# Patient Record
Sex: Female | Born: 1955
Health system: Southern US, Community
[De-identification: ages and names within clinical notes are randomized; demographics above are authoritative.]

## PROBLEM LIST (undated history)

## (undated) DIAGNOSIS — G2581 Restless legs syndrome: Secondary | ICD-10-CM

## (undated) DIAGNOSIS — N3946 Mixed incontinence: Secondary | ICD-10-CM

## (undated) DIAGNOSIS — E042 Nontoxic multinodular goiter: Secondary | ICD-10-CM

## (undated) DIAGNOSIS — Z8673 Personal history of transient ischemic attack (TIA), and cerebral infarction without residual deficits: Secondary | ICD-10-CM

## (undated) DIAGNOSIS — M7989 Other specified soft tissue disorders: Secondary | ICD-10-CM

## (undated) DIAGNOSIS — G4733 Obstructive sleep apnea (adult) (pediatric): Secondary | ICD-10-CM

## (undated) DIAGNOSIS — R112 Nausea with vomiting, unspecified: Secondary | ICD-10-CM

## (undated) DIAGNOSIS — G9332 Myalgic encephalomyelitis/chronic fatigue syndrome: Secondary | ICD-10-CM

## (undated) DIAGNOSIS — R5382 Chronic fatigue, unspecified: Secondary | ICD-10-CM

## (undated) DIAGNOSIS — M8580 Other specified disorders of bone density and structure, unspecified site: Secondary | ICD-10-CM

## (undated) DIAGNOSIS — N3010 Interstitial cystitis (chronic) without hematuria: Secondary | ICD-10-CM

## (undated) DIAGNOSIS — K589 Irritable bowel syndrome without diarrhea: Secondary | ICD-10-CM

## (undated) DIAGNOSIS — F419 Anxiety disorder, unspecified: Secondary | ICD-10-CM

## (undated) DIAGNOSIS — F329 Major depressive disorder, single episode, unspecified: Secondary | ICD-10-CM

## (undated) DIAGNOSIS — G894 Chronic pain syndrome: Secondary | ICD-10-CM

## (undated) DIAGNOSIS — Z973 Presence of spectacles and contact lenses: Secondary | ICD-10-CM

## (undated) DIAGNOSIS — K219 Gastro-esophageal reflux disease without esophagitis: Secondary | ICD-10-CM

## (undated) DIAGNOSIS — G43909 Migraine, unspecified, not intractable, without status migrainosus: Secondary | ICD-10-CM

## (undated) DIAGNOSIS — M1990 Unspecified osteoarthritis, unspecified site: Secondary | ICD-10-CM

## (undated) DIAGNOSIS — E785 Hyperlipidemia, unspecified: Secondary | ICD-10-CM

## (undated) DIAGNOSIS — M797 Fibromyalgia: Secondary | ICD-10-CM

## (undated) DIAGNOSIS — Z9889 Other specified postprocedural states: Secondary | ICD-10-CM

## (undated) DIAGNOSIS — I10 Essential (primary) hypertension: Secondary | ICD-10-CM

## (undated) DIAGNOSIS — E119 Type 2 diabetes mellitus without complications: Secondary | ICD-10-CM

## (undated) HISTORY — DX: Anxiety disorder, unspecified: F41.9

## (undated) HISTORY — DX: Obstructive sleep apnea (adult) (pediatric): G47.33

## (undated) HISTORY — DX: Fibromyalgia: M79.7

## (undated) HISTORY — PX: VENTRAL HERNIA REPAIR: SHX424

## (undated) HISTORY — DX: Restless legs syndrome: G25.81

## (undated) HISTORY — DX: Other specified disorders of bone density and structure, unspecified site: M85.80

## (undated) HISTORY — PX: ANTERIOR CERVICAL DECOMP/DISCECTOMY FUSION: SHX1161

## (undated) HISTORY — DX: Interstitial cystitis (chronic) without hematuria: N30.10

## (undated) HISTORY — DX: Irritable bowel syndrome, unspecified: K58.9

## (undated) HISTORY — PX: LIPOMA EXCISION: SHX5283

## (undated) HISTORY — DX: Unspecified osteoarthritis, unspecified site: M19.90

## (undated) HISTORY — PX: ESOPHAGOGASTRODUODENOSCOPY: SHX1529

## (undated) HISTORY — PX: COLONOSCOPY: SHX174

## (undated) HISTORY — PX: OTHER SURGICAL HISTORY: SHX169

## (undated) HISTORY — DX: Myalgic encephalomyelitis/chronic fatigue syndrome: G93.32

## (undated) HISTORY — DX: Hyperlipidemia, unspecified: E78.5

## (undated) HISTORY — PX: TOTAL KNEE ARTHROPLASTY: SHX125

## (undated) HISTORY — PX: DIAGNOSTIC LAPAROSCOPY: SUR761

## (undated) HISTORY — DX: Chronic fatigue, unspecified: R53.82

## (undated) HISTORY — DX: Essential (primary) hypertension: I10

---
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
## 1976-11-19 HISTORY — PX: BREAST CYST EXCISION: SHX579

## 1980-11-19 HISTORY — PX: OTHER SURGICAL HISTORY: SHX169

## 1981-11-19 HISTORY — PX: ABDOMINAL EXPLORATION SURGERY: SHX538

## 1990-11-19 HISTORY — PX: LAPAROSCOPIC ASSISTED VAGINAL HYSTERECTOMY: SHX5398

## 1992-09-26 ENCOUNTER — Encounter: Payer: Self-pay | Admitting: Gastroenterology

## 1998-03-24 ENCOUNTER — Inpatient Hospital Stay (HOSPITAL_COMMUNITY): Admission: AD | Admit: 1998-03-24 | Discharge: 1998-04-01 | Payer: Self-pay | Admitting: Specialist

## 1998-03-24 ENCOUNTER — Ambulatory Visit (HOSPITAL_COMMUNITY): Admission: RE | Admit: 1998-03-24 | Discharge: 1998-03-24 | Payer: Self-pay | Admitting: Cardiology

## 1998-08-09 ENCOUNTER — Ambulatory Visit (HOSPITAL_COMMUNITY): Admission: RE | Admit: 1998-08-09 | Discharge: 1998-08-09 | Payer: Self-pay | Admitting: Urology

## 1998-10-18 ENCOUNTER — Encounter: Payer: Self-pay | Admitting: Obstetrics and Gynecology

## 1998-10-18 ENCOUNTER — Ambulatory Visit (HOSPITAL_COMMUNITY): Admission: RE | Admit: 1998-10-18 | Discharge: 1998-10-18 | Payer: Self-pay | Admitting: Obstetrics and Gynecology

## 1998-11-15 ENCOUNTER — Other Ambulatory Visit: Admission: RE | Admit: 1998-11-15 | Discharge: 1998-11-15 | Payer: Self-pay | Admitting: Podiatry

## 1999-02-10 ENCOUNTER — Ambulatory Visit (HOSPITAL_COMMUNITY): Admission: RE | Admit: 1999-02-10 | Discharge: 1999-02-10 | Payer: Self-pay | Admitting: Urology

## 1999-09-05 ENCOUNTER — Ambulatory Visit (HOSPITAL_COMMUNITY): Admission: RE | Admit: 1999-09-05 | Discharge: 1999-09-05 | Payer: Self-pay | Admitting: Urology

## 1999-11-09 ENCOUNTER — Encounter: Payer: Self-pay | Admitting: Obstetrics and Gynecology

## 1999-11-09 ENCOUNTER — Ambulatory Visit (HOSPITAL_COMMUNITY): Admission: RE | Admit: 1999-11-09 | Discharge: 1999-11-09 | Payer: Self-pay | Admitting: Obstetrics and Gynecology

## 1999-11-22 ENCOUNTER — Other Ambulatory Visit: Admission: RE | Admit: 1999-11-22 | Discharge: 1999-11-22 | Payer: Self-pay | Admitting: Obstetrics and Gynecology

## 1999-12-15 ENCOUNTER — Encounter: Payer: Self-pay | Admitting: General Surgery

## 1999-12-21 ENCOUNTER — Inpatient Hospital Stay (HOSPITAL_COMMUNITY): Admission: RE | Admit: 1999-12-21 | Discharge: 1999-12-21 | Payer: Self-pay | Admitting: General Surgery

## 2000-03-20 ENCOUNTER — Ambulatory Visit (HOSPITAL_COMMUNITY): Admission: RE | Admit: 2000-03-20 | Discharge: 2000-03-20 | Payer: Self-pay | Admitting: Gastroenterology

## 2000-09-06 ENCOUNTER — Ambulatory Visit (HOSPITAL_COMMUNITY): Admission: RE | Admit: 2000-09-06 | Discharge: 2000-09-06 | Payer: Self-pay | Admitting: Urology

## 2001-05-16 ENCOUNTER — Encounter (INDEPENDENT_AMBULATORY_CARE_PROVIDER_SITE_OTHER): Payer: Self-pay | Admitting: Specialist

## 2001-05-16 ENCOUNTER — Ambulatory Visit (HOSPITAL_COMMUNITY): Admission: RE | Admit: 2001-05-16 | Discharge: 2001-05-16 | Payer: Self-pay | Admitting: Urology

## 2001-07-31 ENCOUNTER — Other Ambulatory Visit: Admission: RE | Admit: 2001-07-31 | Discharge: 2001-07-31 | Payer: Self-pay | Admitting: Obstetrics and Gynecology

## 2001-08-01 ENCOUNTER — Ambulatory Visit (HOSPITAL_COMMUNITY): Admission: RE | Admit: 2001-08-01 | Discharge: 2001-08-01 | Payer: Self-pay | Admitting: Obstetrics and Gynecology

## 2001-08-01 ENCOUNTER — Encounter: Payer: Self-pay | Admitting: Obstetrics and Gynecology

## 2001-10-08 ENCOUNTER — Encounter: Payer: Self-pay | Admitting: *Deleted

## 2001-10-08 ENCOUNTER — Encounter: Admission: RE | Admit: 2001-10-08 | Discharge: 2001-10-08 | Payer: Self-pay | Admitting: *Deleted

## 2002-01-27 ENCOUNTER — Ambulatory Visit (HOSPITAL_BASED_OUTPATIENT_CLINIC_OR_DEPARTMENT_OTHER): Admission: RE | Admit: 2002-01-27 | Discharge: 2002-01-27 | Payer: Self-pay | Admitting: Urology

## 2002-09-11 ENCOUNTER — Ambulatory Visit (HOSPITAL_COMMUNITY): Admission: RE | Admit: 2002-09-11 | Discharge: 2002-09-11 | Payer: Self-pay | Admitting: Urology

## 2002-11-19 HISTORY — PX: NASAL SINUS SURGERY: SHX719

## 2003-04-26 ENCOUNTER — Other Ambulatory Visit: Admission: RE | Admit: 2003-04-26 | Discharge: 2003-04-26 | Payer: Self-pay | Admitting: Family Medicine

## 2003-05-11 ENCOUNTER — Ambulatory Visit (HOSPITAL_BASED_OUTPATIENT_CLINIC_OR_DEPARTMENT_OTHER): Admission: RE | Admit: 2003-05-11 | Discharge: 2003-05-11 | Payer: Self-pay | Admitting: Urology

## 2004-01-18 ENCOUNTER — Observation Stay (HOSPITAL_COMMUNITY): Admission: RE | Admit: 2004-01-18 | Discharge: 2004-01-19 | Payer: Self-pay | Admitting: Obstetrics and Gynecology

## 2004-04-03 ENCOUNTER — Encounter (INDEPENDENT_AMBULATORY_CARE_PROVIDER_SITE_OTHER): Payer: Self-pay | Admitting: *Deleted

## 2004-04-03 ENCOUNTER — Ambulatory Visit (HOSPITAL_COMMUNITY): Admission: RE | Admit: 2004-04-03 | Discharge: 2004-04-03 | Payer: Self-pay | Admitting: Surgery

## 2004-04-03 ENCOUNTER — Ambulatory Visit (HOSPITAL_BASED_OUTPATIENT_CLINIC_OR_DEPARTMENT_OTHER): Admission: RE | Admit: 2004-04-03 | Discharge: 2004-04-03 | Payer: Self-pay | Admitting: Surgery

## 2004-05-08 ENCOUNTER — Ambulatory Visit (HOSPITAL_BASED_OUTPATIENT_CLINIC_OR_DEPARTMENT_OTHER): Admission: RE | Admit: 2004-05-08 | Discharge: 2004-05-08 | Payer: Self-pay | Admitting: Family Medicine

## 2004-10-03 ENCOUNTER — Ambulatory Visit (HOSPITAL_BASED_OUTPATIENT_CLINIC_OR_DEPARTMENT_OTHER): Admission: RE | Admit: 2004-10-03 | Discharge: 2004-10-03 | Payer: Self-pay | Admitting: Urology

## 2004-10-03 ENCOUNTER — Observation Stay (HOSPITAL_COMMUNITY): Admission: RE | Admit: 2004-10-03 | Discharge: 2004-10-04 | Payer: Self-pay | Admitting: Urology

## 2004-12-05 ENCOUNTER — Ambulatory Visit (HOSPITAL_COMMUNITY): Admission: RE | Admit: 2004-12-05 | Discharge: 2004-12-05 | Payer: Self-pay | Admitting: Family Medicine

## 2005-04-02 ENCOUNTER — Encounter: Admission: RE | Admit: 2005-04-02 | Discharge: 2005-07-01 | Payer: Self-pay | Admitting: Physician Assistant

## 2005-06-12 ENCOUNTER — Ambulatory Visit (HOSPITAL_COMMUNITY): Admission: RE | Admit: 2005-06-12 | Discharge: 2005-06-12 | Payer: Self-pay | Admitting: Urology

## 2005-06-12 ENCOUNTER — Ambulatory Visit (HOSPITAL_BASED_OUTPATIENT_CLINIC_OR_DEPARTMENT_OTHER): Admission: RE | Admit: 2005-06-12 | Discharge: 2005-06-12 | Payer: Self-pay | Admitting: Urology

## 2005-08-09 ENCOUNTER — Encounter: Admission: RE | Admit: 2005-08-09 | Discharge: 2005-08-09 | Payer: Self-pay | Admitting: Internal Medicine

## 2005-08-17 ENCOUNTER — Other Ambulatory Visit: Admission: RE | Admit: 2005-08-17 | Discharge: 2005-08-17 | Payer: Self-pay | Admitting: Obstetrics and Gynecology

## 2005-09-05 ENCOUNTER — Emergency Department (HOSPITAL_COMMUNITY): Admission: EM | Admit: 2005-09-05 | Discharge: 2005-09-06 | Payer: Self-pay | Admitting: Emergency Medicine

## 2005-12-05 ENCOUNTER — Encounter: Admission: RE | Admit: 2005-12-05 | Discharge: 2005-12-05 | Payer: Self-pay | Admitting: Urology

## 2005-12-07 ENCOUNTER — Ambulatory Visit (HOSPITAL_BASED_OUTPATIENT_CLINIC_OR_DEPARTMENT_OTHER): Admission: RE | Admit: 2005-12-07 | Discharge: 2005-12-07 | Payer: Self-pay | Admitting: Urology

## 2006-06-05 ENCOUNTER — Inpatient Hospital Stay (HOSPITAL_COMMUNITY): Admission: RE | Admit: 2006-06-05 | Discharge: 2006-06-11 | Payer: Self-pay | Admitting: General Surgery

## 2006-08-12 ENCOUNTER — Encounter: Admission: RE | Admit: 2006-08-12 | Discharge: 2006-08-12 | Payer: Self-pay | Admitting: General Surgery

## 2006-10-22 ENCOUNTER — Ambulatory Visit (HOSPITAL_BASED_OUTPATIENT_CLINIC_OR_DEPARTMENT_OTHER): Admission: RE | Admit: 2006-10-22 | Discharge: 2006-10-22 | Payer: Self-pay | Admitting: Urology

## 2007-07-01 ENCOUNTER — Ambulatory Visit (HOSPITAL_BASED_OUTPATIENT_CLINIC_OR_DEPARTMENT_OTHER): Admission: RE | Admit: 2007-07-01 | Discharge: 2007-07-01 | Payer: Self-pay | Admitting: Urology

## 2007-11-18 ENCOUNTER — Encounter: Admission: RE | Admit: 2007-11-18 | Discharge: 2007-11-18 | Payer: Self-pay | Admitting: Obstetrics and Gynecology

## 2008-01-02 ENCOUNTER — Ambulatory Visit: Payer: Self-pay | Admitting: Gastroenterology

## 2008-02-03 ENCOUNTER — Ambulatory Visit: Payer: Self-pay | Admitting: Gastroenterology

## 2008-02-03 LAB — CONVERTED CEMR LAB
ALT: 20 units/L (ref 0–35)
AST: 17 units/L (ref 0–37)
Alkaline Phosphatase: 75 units/L (ref 39–117)
BUN: 20 mg/dL (ref 6–23)
Basophils Absolute: 0 10*3/uL (ref 0.0–0.1)
Bilirubin, Direct: 0.1 mg/dL (ref 0.0–0.3)
CO2: 32 meq/L (ref 19–32)
Calcium: 9 mg/dL (ref 8.4–10.5)
Creatinine, Ser: 0.9 mg/dL (ref 0.4–1.2)
Eosinophils Relative: 3.3 % (ref 0.0–5.0)
GFR calc Af Amer: 85 mL/min
Glucose, Bld: 185 mg/dL — ABNORMAL HIGH (ref 70–99)
HCT: 37.7 % (ref 36.0–46.0)
Hemoglobin: 12.3 g/dL (ref 12.0–15.0)
MCHC: 32.6 g/dL (ref 30.0–36.0)
MCV: 86.3 fL (ref 78.0–100.0)
Monocytes Absolute: 0.4 10*3/uL (ref 0.2–0.7)
Neutrophils Relative %: 55.8 % (ref 43.0–77.0)
RDW: 12.5 % (ref 11.5–14.6)
Saturation Ratios: 31.5 % (ref 20.0–50.0)
Tissue Transglutaminase Ab, IgA: 0.3 units (ref <7)
Total Bilirubin: 0.6 mg/dL (ref 0.3–1.2)
Total Protein: 6 g/dL (ref 6.0–8.3)
WBC: 4.9 10*3/uL (ref 4.5–10.5)

## 2008-02-06 ENCOUNTER — Encounter (INDEPENDENT_AMBULATORY_CARE_PROVIDER_SITE_OTHER): Payer: Self-pay | Admitting: Orthopedic Surgery

## 2008-02-06 ENCOUNTER — Ambulatory Visit: Payer: Self-pay | Admitting: *Deleted

## 2008-02-06 ENCOUNTER — Ambulatory Visit: Admission: RE | Admit: 2008-02-06 | Discharge: 2008-02-06 | Payer: Self-pay | Admitting: Orthopedic Surgery

## 2008-03-16 ENCOUNTER — Ambulatory Visit: Payer: Self-pay | Admitting: Gastroenterology

## 2008-03-16 DIAGNOSIS — IMO0001 Reserved for inherently not codable concepts without codable children: Secondary | ICD-10-CM

## 2008-03-16 DIAGNOSIS — F411 Generalized anxiety disorder: Secondary | ICD-10-CM | POA: Insufficient documentation

## 2008-03-16 DIAGNOSIS — I10 Essential (primary) hypertension: Secondary | ICD-10-CM

## 2008-03-16 DIAGNOSIS — K589 Irritable bowel syndrome without diarrhea: Secondary | ICD-10-CM

## 2008-03-16 DIAGNOSIS — K210 Gastro-esophageal reflux disease with esophagitis: Secondary | ICD-10-CM

## 2008-05-19 ENCOUNTER — Encounter: Admission: RE | Admit: 2008-05-19 | Discharge: 2008-05-19 | Payer: Self-pay | Admitting: Obstetrics and Gynecology

## 2008-06-02 ENCOUNTER — Encounter: Payer: Self-pay | Admitting: Internal Medicine

## 2008-06-02 LAB — CONVERTED CEMR LAB
HDL: 45 mg/dL
Hgb A1c MFr Bld: 6.9 %
LDL (calc): 35 mg/dL
LDL Cholesterol: 108 mg/dL
Triglyceride fasting, serum: 175 mg/dL

## 2008-07-20 ENCOUNTER — Ambulatory Visit (HOSPITAL_COMMUNITY): Admission: RE | Admit: 2008-07-20 | Discharge: 2008-07-21 | Payer: Self-pay | Admitting: Obstetrics and Gynecology

## 2008-07-20 HISTORY — PX: RECTOCELE REPAIR: SHX761

## 2008-08-24 ENCOUNTER — Encounter: Payer: Self-pay | Admitting: Gastroenterology

## 2008-11-18 ENCOUNTER — Encounter: Admission: RE | Admit: 2008-11-18 | Discharge: 2008-11-18 | Payer: Self-pay | Admitting: Obstetrics and Gynecology

## 2009-01-04 ENCOUNTER — Ambulatory Visit (HOSPITAL_COMMUNITY): Admission: RE | Admit: 2009-01-04 | Discharge: 2009-01-05 | Payer: Self-pay | Admitting: Neurosurgery

## 2009-01-25 ENCOUNTER — Encounter: Payer: Self-pay | Admitting: Internal Medicine

## 2009-01-25 LAB — CONVERTED CEMR LAB
ALT: 12 U/L
AST: 14 U/L
Albumin: 4 g/dL
Alkaline Phosphatase: 71 U/L
BUN: 17 mg/dL
CO2: 26 meq/L
Calcium: 9.2 mg/dL
Chloride: 97 meq/L
Creatinine, Ser: 1.06 mg/dL
Glucose, Bld: 149 mg/dL
Hgb A1c MFr Bld: 6.3 %
Potassium: 4.1 meq/L
Sodium: 138 meq/L
TSH: 1.85 u[IU]/mL
Total Bilirubin: 0.3 mg/dL
Total Protein: 6.5 g/dL

## 2009-02-09 ENCOUNTER — Encounter: Admission: RE | Admit: 2009-02-09 | Discharge: 2009-02-09 | Payer: Self-pay | Admitting: Internal Medicine

## 2009-02-24 ENCOUNTER — Encounter: Admission: RE | Admit: 2009-02-24 | Discharge: 2009-02-24 | Payer: Self-pay | Admitting: Neurosurgery

## 2009-05-26 ENCOUNTER — Encounter: Payer: Self-pay | Admitting: Internal Medicine

## 2009-05-26 LAB — CONVERTED CEMR LAB
ALT: 11 units/L
Alkaline Phosphatase: 76 units/L
CO2: 25 meq/L
Chloride: 103 meq/L
HCT: 36.6 %
Hgb A1c MFr Bld: 6.4 %
LDL (calc): 25 mg/dL
LDL Cholesterol: 118 mg/dL
MCV: 88 fL
Platelets: 192 10*3/uL
RBC: 4.18 M/uL
RDW: 14.2 %
TSH: 1.74 microintl units/mL
Total Bilirubin: 0.3 mg/dL
Triglyceride fasting, serum: 127 mg/dL

## 2009-06-07 ENCOUNTER — Other Ambulatory Visit: Admission: RE | Admit: 2009-06-07 | Discharge: 2009-06-07 | Payer: Self-pay | Admitting: Diagnostic Radiology

## 2009-06-07 ENCOUNTER — Encounter: Admission: RE | Admit: 2009-06-07 | Discharge: 2009-06-07 | Payer: Self-pay | Admitting: Otolaryngology

## 2009-06-07 ENCOUNTER — Encounter (INDEPENDENT_AMBULATORY_CARE_PROVIDER_SITE_OTHER): Payer: Self-pay | Admitting: Diagnostic Radiology

## 2009-10-11 ENCOUNTER — Ambulatory Visit: Payer: Self-pay | Admitting: Internal Medicine

## 2009-10-11 DIAGNOSIS — M810 Age-related osteoporosis without current pathological fracture: Secondary | ICD-10-CM | POA: Insufficient documentation

## 2009-10-11 DIAGNOSIS — J309 Allergic rhinitis, unspecified: Secondary | ICD-10-CM | POA: Insufficient documentation

## 2009-10-11 DIAGNOSIS — K219 Gastro-esophageal reflux disease without esophagitis: Secondary | ICD-10-CM

## 2009-10-11 DIAGNOSIS — Z87442 Personal history of urinary calculi: Secondary | ICD-10-CM | POA: Insufficient documentation

## 2009-10-11 DIAGNOSIS — N3010 Interstitial cystitis (chronic) without hematuria: Secondary | ICD-10-CM | POA: Insufficient documentation

## 2009-10-11 DIAGNOSIS — E1169 Type 2 diabetes mellitus with other specified complication: Secondary | ICD-10-CM

## 2009-10-11 DIAGNOSIS — E785 Hyperlipidemia, unspecified: Secondary | ICD-10-CM

## 2009-10-11 DIAGNOSIS — J329 Chronic sinusitis, unspecified: Secondary | ICD-10-CM | POA: Insufficient documentation

## 2009-10-11 LAB — CONVERTED CEMR LAB
BUN: 18 mg/dL (ref 6–23)
CO2: 28 meq/L (ref 19–32)
Chloride: 101 meq/L (ref 96–112)
Cholesterol: 206 mg/dL — ABNORMAL HIGH (ref 0–200)
Creatinine, Ser: 0.8 mg/dL (ref 0.4–1.2)
Creatinine,U: 163 mg/dL
Eosinophils Absolute: 0.1 10*3/uL (ref 0.0–0.7)
Eosinophils Relative: 0.9 % (ref 0.0–5.0)
Glucose, Bld: 121 mg/dL — ABNORMAL HIGH (ref 70–99)
HCT: 40.6 % (ref 36.0–46.0)
HDL: 53.1 mg/dL (ref >39.00)
Lymphs Abs: 1.5 10*3/uL (ref 0.7–4.0)
MCHC: 33.5 g/dL (ref 30.0–36.0)
MCV: 89.3 fL (ref 78.0–100.0)
Monocytes Absolute: 0.4 10*3/uL (ref 0.1–1.0)
Neutrophils Relative %: 70.4 % (ref 43.0–77.0)
Platelets: 196 10*3/uL (ref 150.0–400.0)
Potassium: 4.1 meq/L (ref 3.5–5.1)
TSH: 2.05 microintl units/mL (ref 0.35–5.50)
Total CHOL/HDL Ratio: 4
Triglycerides: 124 mg/dL (ref 0.0–149.0)
VLDL: 24.8 mg/dL (ref 0.0–40.0)
WBC: 6.9 10*3/uL (ref 4.5–10.5)

## 2009-10-17 DIAGNOSIS — E559 Vitamin D deficiency, unspecified: Secondary | ICD-10-CM | POA: Insufficient documentation

## 2009-10-26 ENCOUNTER — Encounter: Payer: Self-pay | Admitting: Internal Medicine

## 2009-11-16 ENCOUNTER — Encounter (INDEPENDENT_AMBULATORY_CARE_PROVIDER_SITE_OTHER): Payer: Self-pay | Admitting: *Deleted

## 2009-11-16 ENCOUNTER — Ambulatory Visit: Payer: Self-pay | Admitting: Internal Medicine

## 2009-11-16 ENCOUNTER — Telehealth: Payer: Self-pay | Admitting: Internal Medicine

## 2009-11-16 DIAGNOSIS — G5600 Carpal tunnel syndrome, unspecified upper limb: Secondary | ICD-10-CM | POA: Insufficient documentation

## 2009-11-17 ENCOUNTER — Encounter: Admission: RE | Admit: 2009-11-17 | Discharge: 2009-11-17 | Payer: Self-pay | Admitting: Internal Medicine

## 2009-12-06 ENCOUNTER — Encounter: Payer: Self-pay | Admitting: Internal Medicine

## 2009-12-12 ENCOUNTER — Telehealth: Payer: Self-pay | Admitting: Internal Medicine

## 2009-12-19 ENCOUNTER — Telehealth: Payer: Self-pay | Admitting: Internal Medicine

## 2009-12-30 ENCOUNTER — Encounter: Payer: Self-pay | Admitting: Internal Medicine

## 2010-01-10 ENCOUNTER — Ambulatory Visit: Payer: Self-pay | Admitting: Internal Medicine

## 2010-01-10 DIAGNOSIS — J209 Acute bronchitis, unspecified: Secondary | ICD-10-CM

## 2010-01-12 ENCOUNTER — Encounter: Payer: Self-pay | Admitting: Endocrinology

## 2010-01-12 ENCOUNTER — Telehealth (INDEPENDENT_AMBULATORY_CARE_PROVIDER_SITE_OTHER): Payer: Self-pay | Admitting: *Deleted

## 2010-01-16 ENCOUNTER — Telehealth: Payer: Self-pay | Admitting: Internal Medicine

## 2010-01-16 ENCOUNTER — Encounter (INDEPENDENT_AMBULATORY_CARE_PROVIDER_SITE_OTHER): Payer: Self-pay | Admitting: *Deleted

## 2010-01-30 ENCOUNTER — Encounter (INDEPENDENT_AMBULATORY_CARE_PROVIDER_SITE_OTHER): Payer: Self-pay | Admitting: *Deleted

## 2010-01-30 ENCOUNTER — Ambulatory Visit: Payer: Self-pay | Admitting: Internal Medicine

## 2010-01-30 DIAGNOSIS — M25559 Pain in unspecified hip: Secondary | ICD-10-CM

## 2010-02-21 ENCOUNTER — Encounter: Payer: Self-pay | Admitting: Internal Medicine

## 2010-02-28 ENCOUNTER — Encounter: Payer: Self-pay | Admitting: Internal Medicine

## 2010-03-06 ENCOUNTER — Encounter: Admission: RE | Admit: 2010-03-06 | Discharge: 2010-03-06 | Payer: Self-pay | Admitting: Otolaryngology

## 2010-03-14 ENCOUNTER — Encounter: Payer: Self-pay | Admitting: Internal Medicine

## 2010-05-10 ENCOUNTER — Ambulatory Visit: Payer: Self-pay | Admitting: Internal Medicine

## 2010-05-10 DIAGNOSIS — G2581 Restless legs syndrome: Secondary | ICD-10-CM

## 2010-05-11 LAB — CONVERTED CEMR LAB
CO2: 31 meq/L (ref 19–32)
Calcium: 9.7 mg/dL (ref 8.4–10.5)
Creatinine, Ser: 0.8 mg/dL (ref 0.4–1.2)

## 2010-05-12 ENCOUNTER — Telehealth: Payer: Self-pay | Admitting: Gastroenterology

## 2010-05-30 ENCOUNTER — Encounter: Payer: Self-pay | Admitting: Internal Medicine

## 2010-06-09 ENCOUNTER — Ambulatory Visit: Payer: Self-pay | Admitting: Internal Medicine

## 2010-06-09 ENCOUNTER — Telehealth: Payer: Self-pay | Admitting: Gastroenterology

## 2010-06-09 ENCOUNTER — Telehealth (INDEPENDENT_AMBULATORY_CARE_PROVIDER_SITE_OTHER): Payer: Self-pay | Admitting: *Deleted

## 2010-06-13 ENCOUNTER — Encounter: Payer: Self-pay | Admitting: Internal Medicine

## 2010-07-14 ENCOUNTER — Ambulatory Visit: Payer: Self-pay | Admitting: Internal Medicine

## 2010-07-14 ENCOUNTER — Encounter: Payer: Self-pay | Admitting: Internal Medicine

## 2010-07-17 ENCOUNTER — Encounter: Payer: Self-pay | Admitting: Internal Medicine

## 2010-07-31 ENCOUNTER — Inpatient Hospital Stay (HOSPITAL_COMMUNITY): Admission: RE | Admit: 2010-07-31 | Discharge: 2010-08-03 | Payer: Self-pay | Admitting: Orthopedic Surgery

## 2010-08-03 ENCOUNTER — Ambulatory Visit: Payer: Self-pay | Admitting: Family Medicine

## 2010-08-03 DIAGNOSIS — G894 Chronic pain syndrome: Secondary | ICD-10-CM

## 2010-08-03 DIAGNOSIS — T887XXA Unspecified adverse effect of drug or medicament, initial encounter: Secondary | ICD-10-CM | POA: Insufficient documentation

## 2010-08-04 ENCOUNTER — Ambulatory Visit: Payer: Self-pay | Admitting: Vascular Surgery

## 2010-08-04 ENCOUNTER — Encounter: Payer: Self-pay | Admitting: Family Medicine

## 2010-08-04 ENCOUNTER — Telehealth: Payer: Self-pay | Admitting: Family Medicine

## 2010-08-04 ENCOUNTER — Encounter (INDEPENDENT_AMBULATORY_CARE_PROVIDER_SITE_OTHER): Payer: Self-pay | Admitting: Emergency Medicine

## 2010-08-04 ENCOUNTER — Emergency Department (HOSPITAL_COMMUNITY): Admission: EM | Admit: 2010-08-04 | Discharge: 2010-08-04 | Payer: Self-pay | Admitting: Emergency Medicine

## 2010-08-10 ENCOUNTER — Encounter: Payer: Self-pay | Admitting: Family Medicine

## 2010-08-21 ENCOUNTER — Telehealth (INDEPENDENT_AMBULATORY_CARE_PROVIDER_SITE_OTHER): Payer: Self-pay | Admitting: *Deleted

## 2010-08-29 ENCOUNTER — Inpatient Hospital Stay (HOSPITAL_COMMUNITY): Admission: RE | Admit: 2010-08-29 | Discharge: 2010-09-01 | Payer: Self-pay | Admitting: Orthopedic Surgery

## 2010-10-04 ENCOUNTER — Encounter: Payer: Self-pay | Admitting: Internal Medicine

## 2010-10-05 ENCOUNTER — Telehealth: Payer: Self-pay | Admitting: Internal Medicine

## 2010-10-16 ENCOUNTER — Encounter: Payer: Self-pay | Admitting: Internal Medicine

## 2010-10-17 ENCOUNTER — Ambulatory Visit: Payer: Self-pay | Admitting: Internal Medicine

## 2010-10-17 DIAGNOSIS — M129 Arthropathy, unspecified: Secondary | ICD-10-CM

## 2010-12-08 ENCOUNTER — Ambulatory Visit: Admit: 2010-12-08 | Payer: Self-pay | Admitting: Internal Medicine

## 2010-12-10 ENCOUNTER — Encounter: Payer: Self-pay | Admitting: Internal Medicine

## 2010-12-10 ENCOUNTER — Encounter: Payer: Self-pay | Admitting: Obstetrics and Gynecology

## 2010-12-13 ENCOUNTER — Telehealth: Payer: Self-pay | Admitting: Internal Medicine

## 2010-12-21 NOTE — Letter (Signed)
Summary: Out of Work  [facility]  [address] [place], [place] [address]   Phone: [phone]  Fax: [phone]    January 12, 2010   Employee:  [name] [facility]    To Whom It May Concern:   For Medical reasons, please excuse the above named employee from work for the following dates:  Start:   01/05/10  End:   01/16/10    Sincerely,    Minus Breeding MD

## 2010-12-21 NOTE — Progress Notes (Signed)
Summary: FMLA?  Phone Note Call from Patient Call back at Home Phone [phone]   Caller: Patient Summary of Call: pt called stating that she needs MD to change dates on her FMLA form to reflect updated out of work note done by SAE. I will advise pt to bring in paperwork and I will update. Will MD initial? Initial call taken by: Margaret Pyle, CMA,  January 16, 2010 9:03 AM  Follow-up for Phone Call        fine Follow-up by: Newt Lukes MD,  January 16, 2010 9:30 AM  Additional Follow-up for Phone Call Additional follow up Details #1::        pt is requesting to return to [facility], [place] tomorrow- March 1st, and would like this on FMLA and a work note. okay to generate? Additional Follow-up by: Margaret Pyle, CMA,  January 16, 2010 9:41 AM    Additional Follow-up for Phone Call Additional follow up Details #2::    fine Newt Lukes MD  January 16, 2010 9:46 AM   Additional Follow-up for Phone Call Additional follow up Details #3:: Details for Additional Follow-up Action Taken: done, pt notified Additional Follow-up by: Margaret Pyle, CMA,  January 16, 2010 10:08 AM

## 2010-12-21 NOTE — Progress Notes (Signed)
Summary: switch  Phone Note Call from Patient Call back at Home Phone [phone]   Caller: Patient Call For: Jarold Motto Reason for Call: Talk to Nurse Summary of Call: Patient would like to switch from Dr Jarold Motto to Dr Juanda Chance because she met Dr Juanda Chance in the hosp. while her friend was getting a colonoscopy and because Dr Juanda Chance is her mothers doctor. Initial call taken by: Tawni Levy,  May 12, 2010 9:32 AM  Follow-up for Phone Call        DEFINITELY OK... Follow-up by: Mardella Layman MD Clementeen Graham  May 12, 2010 10:02 AM    Additional Follow-up for Phone Call Additional follow up Details #2::    I am sorry, my practice is full,  Sorry. Follow-up by: Hart Carwin MD,  May 12, 2010 2:03 PM

## 2010-12-21 NOTE — Progress Notes (Signed)
  Phone Note Other Incoming   Summary of Call: Pt left a message on triage stating Dr Felicity Coyer put her off work for a viral infection and to return on 01/12/10. Pt states she still has a temp of 99.6. Pt would like to know if she should go back today or wait? Please advise? Initial call taken by: Josph Macho RMA,  January 12, 2010 12:44 PM  Follow-up for Phone Call        please reteurn to work 01/16/10.  i can write a letter extending if you wish. Follow-up by: Minus Breeding MD,  January 12, 2010 1:31 PM  Additional Follow-up for Phone Call Additional follow up Details #1::        Informed pt and she would like a note stating that please. Additional Follow-up by: Josph Macho RMA,  January 12, 2010 1:43 PM    Additional Follow-up for Phone Call Additional follow up Details #2::    done Follow-up by: Minus Breeding MD,  January 12, 2010 2:56 PM  Additional Follow-up for Phone Call Additional follow up Details #3:: Details for Additional Follow-up Action Taken: Informed pt that note would be up front. Additional Follow-up by: Josph Macho RMA,  January 12, 2010 3:37 PM

## 2010-12-21 NOTE — Assessment & Plan Note (Signed)
Primary Care Remmie Bembenek:  Newt Lukes MD   History of Present Illness: Patient unhappy with physcial therapy program at [facility] and has arranged transfer to another facility.  She expects therapy several hours 7 days per week, and plans for second knee replacement in a few months.    Med changes only inculded addition of coumadin to prevent DVT, she has had one week of therapy and will need a 3 additional weeks.  FL2 was completed and patient was seen.  Current Medications (verified): 1)  Amlodipine Besylate 5 Mg Tabs (Amlodipine Besylate) .... Take 1 By Mouth Qd 2)  Losartan Potassium 50 Mg Tabs (Losartan Potassium) .Marland Kitchen.. 1 By Mouth Once Daily 3)  Lovaza 1 Gm Caps (Omega-3-Acid Ethyl Esters) .... Take One By Mouth Two Times A Day 4)  Pristiq 50 Mg Xr24h-Tab (Desvenlafaxine Succinate) .Marland Kitchen.. 1 By Mouth Once Daily 5)  Celexa 40 Mg Tabs (Citalopram Hydrobromide) .... Take One By Mouth At Bedtime 6)  Nexium 40 Mg Cpdr (Esomeprazole Magnesium) .... Take One By Mouth At Bedtime 7)  Baclofen 10 Mg Tabs (Baclofen) .Marland Kitchen.. 1 Tab By Mouth Three Times A Day As Needed Pain 8)  Trazodone Hcl 100 Mg Tabs (Trazodone Hcl) .... Take One By Mouth At Bedtime 9)  Requip 4 Mg Tabs (Ropinirole Hcl) .Marland Kitchen.. 1 By Mouth At Bedtime 10)  Lasix 20 Mg Tabs (Furosemide) .... Take One By Mouth Once Daily 11)  Potassium Gluconate 595 Mg Tabs (Potassium Gluconate) .... Tale 1 By Mouth Qd 12)  Fentanyl 100 Mcg/hr Pt72 (Fentanyl) .... Change Q 3 Days 13)  Alprazolam 1 Mg Tabs (Alprazolam) .... A Tab By Mouth Three Times A Day Prn 14)  Allegra 180 Mg Tabs (Fexofenadine Hcl) .... Take 1 By Mouth Once Daily As Needed 15)  Promethazine Hcl 25 Mg Tabs (Promethazine Hcl) .... Take One By Mouth At Bedtime 16)  Triamcinolone Acetonide 0.1 % Crea (Triamcinolone Acetonide) .... Appy As Directed 17)  Vagifem 10 Mcg Tabs (Estradiol) .... Take 1 At Bedtime Mon, Wed and Fri 18)  Oxycodone Hcl 15 Mg Tabs (Oxycodone Hcl) .... One Tab Q4  As Needed Pain 19)  Acetaminophen 500 Mg Caps (Acetaminophen) .... 2 Tabs Q 8 Hours As Needed Pain 20)  Colace 100 Mg Caps (Docusate Sodium) .... Once Daily 21)  Ferrous Sulfate 325 (65 Fe) Mg Tabs (Ferrous Sulfate) .... One Tablet Daily 22)  Coumadin 5 Mg Tabs (Warfarin Sodium) .... As Directed  Allergies (verified): 1)  ! Macrobid 2)  ! Darvocet 3)  ! Flagyl 4)  ! Lopid 5)  ! * Altace 6)  ! * Benicar Hct 7)  ! Parafon Forte 8)  ! Neurontin 9)  ! Sulfa 10)  ! * Elmiron 11)  ! * Ramipril  Physical Exam  General:  Well-developed,well-nourished,in no acute distress; alert,lying in bed.   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  INR was 2.3 today on 7.5 mg daily, she was order to stay on that dose with recheck on 08/14/10 at that time she will be under the care of another Tarrin Menn.  Orders: Sarayu Prevost Misc Charge- [facility] (Misc)  Problem # 2:  CHRONIC PAIN SYNDROME (ICD-338.4) Tolerating high doses of narcotics and serotonin based meds. Orders: Germain Koopmann Misc Charge- [facility] (Misc)  Complete Medication List: 1)  Amlodipine Besylate 5  Mg Tabs (Amlodipine besylate) .... Take 1 by mouth qd 2)  Losartan Potassium 50 Mg Tabs (Losartan potassium) .Marland Kitchen.. 1 by mouth once daily 3)  Lovaza 1 Gm Caps (Omega-3-acid ethyl esters) .... Take one by mouth two times a day 4)  Pristiq 50 Mg Xr24h-tab (Desvenlafaxine succinate) .Marland Kitchen.. 1 by mouth once daily 5)  Celexa 40 Mg Tabs (Citalopram hydrobromide) .... Take one by mouth at bedtime 6)  Nexium 40 Mg Cpdr (Esomeprazole magnesium) .... Take one by mouth at bedtime 7)  Baclofen 10 Mg Tabs (Baclofen) .Marland Kitchen.. 1 tab by mouth three times a day as needed pain 8)  Trazodone Hcl 100 Mg Tabs (Trazodone hcl) .... Take one by mouth at bedtime 9)  Requip 4 Mg Tabs (Ropinirole  hcl) .Marland Kitchen.. 1 by mouth at bedtime 10)  Lasix 20 Mg Tabs (Furosemide) .... Take one by mouth once daily 11)  Potassium Gluconate 595 Mg Tabs (Potassium gluconate) .... Tale 1 by mouth qd 12)  Fentanyl 100 Mcg/hr Pt72 (Fentanyl) .... Change q 3 days 13)  Alprazolam 1 Mg Tabs (Alprazolam) .... A tab by mouth three times a day prn 14)  Allegra 180 Mg Tabs (Fexofenadine hcl) .... Take 1 by mouth once daily as needed 15)  Promethazine Hcl 25 Mg Tabs (Promethazine hcl) .... Take one by mouth at bedtime 16)  Triamcinolone Acetonide 0.1 % Crea (Triamcinolone acetonide) .... Appy as directed 17)  Vagifem 10 Mcg Tabs (Estradiol) .... Take 1 at bedtime mon, wed and fri 18)  Oxycodone Hcl 15 Mg Tabs (Oxycodone hcl) .... One tab q4 as needed pain 19)  Acetaminophen 500 Mg Caps (Acetaminophen) .... 2 tabs q 8 hours as needed pain 20)  Colace 100 Mg Caps (Docusate sodium) .... Once daily 21)  Ferrous Sulfate 325 (65 Fe) Mg Tabs (Ferrous sulfate) .... One tablet daily 22)  Coumadin 5 Mg Tabs (Warfarin sodium) .... As directed

## 2010-12-21 NOTE — Miscellaneous (Signed)
Summary: Admission   Primary Care Provider:  Newt Lukes MD   History of Present Illness: Patient underwent a left TKR by Dr. Charlann Boxer on 07/31/10 and is being admitted to [facility] for short term rehabilitation.  Post op course was uneventful.  She is usually followed by Dr. Felicity Coyer at [facility] practice for the following chronic medical problems:  Current Problems:  PREOPERATIVE EXAMINATION (ICD-V72.84) RESTLESS LEG SYNDROME (ICD-333.94) HIP PAIN, LEFT (ICD-719.45) ACUTE BRONCHITIS (ICD-466.0) CARPAL TUNNEL SYNDROME, RIGHT (ICD-354.0) VITAMIN D DEFICIENCY (ICD-268.9) SINUSITIS (ICD-473.9) OSTEOPENIA (ICD-733.90) INTERSTITIAL CYSTITIS (ICD-595.1) GERD (ICD-530.81) ANXIETY DISORDER (ICD-300.00) DEPRESSION (ICD-311) DIABETES (ICD-250.00) HYPERTENSION (ICD-401.9) FIBROMYALGIA (ICD-729.1) DYSLIPIDEMIA (ICD-272.4) FAMILY HISTORY DIABETES 1ST DEGREE RELATIVE (ICD-V18.0) FAMILY HISTORY BREAST CANCER 1ST DEGREE RELATIVE <50 (ICD-V16.3) BRONCHITIS, CHRONIC (ICD-491.9) INGUINAL HERNIORRHAPHY, HX OF (ICD-V45.89) ALLERGIC RHINITIS (ICD-477.9) RENAL CALCULUS, HX OF (ICD-V13.01) DIVERTICULITIS, HX OF (ICD-V12.79) REFLUX ESOPHAGITIS (ICD-530.11) ABDOMINAL PAIN-GENERALIZED (ICD-789.07) IRRITABLE BOWEL SYNDROME (ICD-564.1) * MISCARRIAGE  After admitted to [facility], asked to be taken to ER for pain control due to pain and spasm of left knee.  Is now back at [facility] and is comfortable.  Current Medications (verified): 1)  Amlodipine Besylate 5 Mg Tabs (Amlodipine Besylate) .... Take 1 By Mouth Qd 2)  Losartan Potassium 50 Mg Tabs (Losartan Potassium) .Marland Kitchen.. 1 By Mouth Once Daily 3)  Lovaza 1 Gm Caps (Omega-3-Acid Ethyl Esters) .... Take One By Mouth Two Times A Day 4)  Pristiq 50 Mg Xr24h-Tab (Desvenlafaxine Succinate) .Marland Kitchen.. 1 By Mouth Once Daily 5)  Celexa 40 Mg Tabs (Citalopram Hydrobromide) .... Take One By Mouth At Bedtime 6)  Nexium 40 Mg Cpdr (Esomeprazole Magnesium) ....  Take One By Mouth At Bedtime 7)  Baclofen 10 Mg Tabs (Baclofen) .Marland Kitchen.. 1 Tab By Mouth Three Times A Day As Needed Pain 8)  Trazodone Hcl 100 Mg Tabs (Trazodone Hcl) .... Take One By Mouth At Bedtime 9)  Requip 4 Mg Tabs (Ropinirole Hcl) .Marland Kitchen.. 1 By Mouth At Bedtime 10)  Lasix 20 Mg Tabs (Furosemide) .... Take One By Mouth Once Daily 11)  Potassium Gluconate 595 Mg Tabs (Potassium Gluconate) .... Tale 1 By Mouth Qd 12)  Fentanyl 100 Mcg/hr Pt72 (Fentanyl) .... Change Q 3 Days 13)  Alprazolam 1 Mg Tabs (Alprazolam) .... A Tab By Mouth Three Times A Day Prn 14)  Allegra 180 Mg Tabs (Fexofenadine Hcl) .... Take 1 By Mouth Once Daily As Needed 15)  Promethazine Hcl 25 Mg Tabs (Promethazine Hcl) .... Take One By Mouth At Bedtime 16)  Triamcinolone Acetonide 0.1 % Crea (Triamcinolone Acetonide) .... Appy As Directed 17)  Vagifem 10 Mcg Tabs (Estradiol) .... Take 1 At Bedtime Mon, Wed and Fri 18)  Oxycodone Hcl 15 Mg Tabs (Oxycodone Hcl) .... One Tab Q4 As Needed Pain 19)  Acetaminophen 500 Mg Caps (Acetaminophen) .... 2 Tabs Q 8 Hours As Needed Pain 20)  Colace 100 Mg Caps (Docusate Sodium) .... Once Daily 21)  Ferrous Sulfate 325 (65 Fe) Mg Tabs (Ferrous Sulfate) .... One Tablet Daily 22)  Coumadin 5 Mg Tabs (Warfarin Sodium) .... As Directed  Allergies (verified): 1)  ! Macrobid 2)  ! Darvocet 3)  ! Flagyl 4)  ! Lopid 5)  ! * Altace 6)  ! * Benicar Hct 7)  ! Parafon Forte 8)  ! Neurontin 9)  ! Sulfa 10)  ! * Elmiron 11)  ! * Ramipril  Past History:  Past Medical History: Last updated: 07/14/2010 Anxiety Disorder Depression Diabetes type 2 Hypertension Dyslipidemia Fibromyalgia Restless  leg syndrome Interstitial cystitis  Chronic fatigue syndrome Irritable Bowel Syndrome Chronic abdominal pain Allergic rhinitis Diverticulitis, hx of GERD thyroid nodules/goiter   osteoarthritis, knee -left   physican roster - [facility] [place], then [place], now [place]  Past Surgical History: Last updated: 10/11/2009 Ventral hernia repair with MESH...Dr. Jamey Ripa... Appendectomy (1610) Hysterectomy (1992) Breast biopsy (1978) Tonsillectomy (1970) Laparotomy-exploratory, right ovary 1982 surgery on her nose & sinus (12/29/02) Rectocele reapir (07/20/08) Neck surgery, cervical fusion (01/04/09) bladder hydrodistension 11/2008,06/2007,10/2006,05/2005,09/2004,01/2004,04/2003,08/2002 tumor removed from scalp (01/1992 and 1998)  Family History: Last updated: 10/11/2009 Family History of Breast Cancer:grandmother No FH of Colon Cancer: Family History of Diabetes: father Family History of Arthritis (other relative) Family History Hypertension (parent) Migraines (parent)  Social History: Last updated: 08/03/2010 Occupation: disabled, not working Divorced lives alone - has one daughter and one son. Patient has never smoked.  Alcohol Use - yes Illicit Drug Use - no PMH-FH-SH reviewed-no changes except otherwise noted  Social History: Occupation: disabled, not working Divorced lives alone - has one daughter and one son. Patient has never smoked.  Alcohol Use - yes Illicit Drug Use - no  Review of Systems      See HPI  Physical Exam  General:  overweight-appearing.  alert, well-developed, well-nourished, and cooperative to examination.  Lying in bed.  Eyes:  pupils equal, pupils round, pupils reactive to light, and no nystagmus.   Mouth:  fair dentition Lungs:  normal respiratory effort, no intercostal retractions or use of accessory muscles; normal breath sounds bilaterally - no crackles and no wheezes.    Heart:  normal rate, regular rhythm, no murmur, and no rub. BLE without edema.  Abdomen:  soft, non-tender, normal bowel sounds, no distention; no masses and no appreciable hepatomegaly or splenomegaly.   Msk:  RIght knee with bandage in place.  No  surrounding erythema or drainage.   Pulses:  2+ DP/PT Extremities:  no LE edema Neurologic:  alert & oriented X3 and cranial nerves II-XII intact.Constantly moving (fidgeting). Slightly slurred speech.  Psych:  Oriented X3.     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  Patient motivated.  Will monitor closely as pain control may be challenging.  Home medication  of flexeil changed to baclofen and patient has oxycodone and tylenol added since surgery in addition to her home doses of antidepressants.  Orders: Provider Misc Charge- [facility] (Misc)  Problem # 2:  DEPRESSION (ICD-311) Apparently this has been a hard to control problem, with use of high doses on antidpressants.  Will continue current home meds.   Her updated medication list for this problem includes:    Pristiq 50 Mg Xr24h-tab (Desvenlafaxine succinate) .Marland Kitchen... 1 by mouth once daily    Celexa 40 Mg Tabs (Citalopram hydrobromide) .Marland Kitchen... Take one by mouth at bedtime    Trazodone Hcl 100 Mg Tabs (Trazodone hcl) .Marland Kitchen... Take one by mouth at bedtime    Alprazolam 1 Mg Tabs (Alprazolam) .Marland Kitchen... A tab by mouth three times a day prn  Problem # 3:  DIABETES (ICD-250.00)  Diet controlled per medical records.  She is obese.  Her updated medication list for this problem includes:    Losartan Potassium 50 Mg Tabs (Losartan potassium) .Marland Kitchen... 1 by mouth once daily  Orders: Provider Misc Charge- [facility] (Misc)  Problem # 4:  CHRONIC PAIN SYNDROME (ICD-338.4)  Patient has been on  a Fentynl patch per primary provider at 100 micrograms.  She is also receiving as needed oxycodone for post operative pain.  See problem #1.   Orders: Provider Misc Charge- [facility] (Misc)  Problem # 5:  RESTLESS LEG SYNDROME (ICD-333.94)  Continue requip.  Orders: Provider Misc Charge- [facility] (Misc)  Problem # 6:  Disposition Anticipate discharge  to home after short term rehabilitation.  Patient plans on knee replacement of the right knee in october.  Problem # 7:  ADVERSE DRUG REACTION (ICD-995.20)  Possible movement disorder. At risk for serotonin syndrome, but she wants to stay on her current regimine except for her antispasmotic medication. Will see her response to Baclofen in place of Cyclobenzaprine.   Orders: Provider Misc Charge- [facility] (Misc)  Complete Medication List: 1)  Amlodipine Besylate 5 Mg Tabs (Amlodipine besylate) .... Take 1 by mouth qd 2)  Losartan Potassium 50 Mg Tabs (Losartan potassium) .Marland Kitchen.. 1 by mouth once daily 3)  Lovaza 1 Gm Caps (Omega-3-acid ethyl esters) .... Take one by mouth two times a day 4)  Pristiq 50 Mg Xr24h-tab (Desvenlafaxine succinate) .Marland Kitchen.. 1 by mouth once daily 5)  Celexa 40 Mg Tabs (Citalopram hydrobromide) .... Take one by mouth at bedtime 6)  Nexium 40 Mg Cpdr (Esomeprazole magnesium) .... Take one by mouth at bedtime 7)  Baclofen 10 Mg Tabs (Baclofen) .Marland Kitchen.. 1 tab by mouth three times a day as needed pain 8)  Trazodone Hcl 100 Mg Tabs (Trazodone hcl) .... Take one by mouth at bedtime 9)  Requip 4 Mg Tabs (Ropinirole hcl) .Marland Kitchen.. 1 by mouth at bedtime 10)  Lasix 20 Mg Tabs (Furosemide) .... Take one by mouth once daily 11)  Potassium Gluconate 595 Mg Tabs (Potassium gluconate) .... Tale 1 by mouth qd 12)  Fentanyl 100 Mcg/hr Pt72 (Fentanyl) .... Change q 3 days 13)  Alprazolam 1 Mg Tabs (Alprazolam) .... A tab by mouth three times a day prn 14)  Allegra 180 Mg Tabs (Fexofenadine hcl) .... Take 1 by mouth once daily as needed 15)  Promethazine Hcl 25 Mg Tabs (Promethazine hcl) .... Take one by mouth at bedtime 16)  Triamcinolone Acetonide 0.1 % Crea (Triamcinolone acetonide) .... Appy as directed 17)  Vagifem 10 Mcg Tabs (Estradiol) .... Take 1 at bedtime mon, wed and fri 18)  Oxycodone Hcl 15 Mg Tabs (Oxycodone hcl) .... One tab q4 as needed pain 19)  Acetaminophen 500 Mg Caps (Acetaminophen)  .... 2 tabs q 8 hours as needed pain 20)  Colace 100 Mg Caps (Docusate sodium) .... Once daily 21)  Ferrous Sulfate 325 (65 Fe) Mg Tabs (Ferrous sulfate) .... One tablet daily 22)  Coumadin 5 Mg Tabs (Warfarin sodium) .... As directed

## 2010-12-21 NOTE — Consult Note (Signed)
Summary: Aundra Dubin MD  Aundra Dubin MD   Imported By: Lester Mendon 03/02/2010 12:06:59  _____________________________________________________________________  External Attachment:    Type:   Image     Comment:   External Document

## 2010-12-21 NOTE — Consult Note (Signed)
Summary: [facility]   Imported By: Lester [place] 01/11/2010 07:50:39  _____________________________________________________________________  External Attachment:    Type:   Image     Comment:   External Document

## 2010-12-21 NOTE — Assessment & Plan Note (Signed)
Summary: Muscle Spasms   Primary Care Provider:  Newt Lukes MD   History of Present Illness: On Call MD called at 0500 by nursing staff for complaint of muscle spasms.  At 0400 Pt given flexril which has not been effective.  Pt states that If her pain and spasms cannot be controlled she would like to go back to the hospital. No fever or chills.  Allergies: 1)  ! Macrobid 2)  ! Darvocet 3)  ! Flagyl 4)  ! Lopid 5)  ! * Altace 6)  ! * Benicar Hct 7)  ! Parafon Forte 8)  ! Neurontin 9)  ! Sulfa 10)  ! * Elmiron 11)  ! * Ramipril  Physical Exam  General:  In pain appearing woman Msk:  Muscle spasms involving the left upper leg.    Impression & Recommendations:  Problem # 1:  Spasms No baclofen avaliable at [facility] at 0500.   Plan to provide her as needed xanax for muscle relaxation effects of benzos.  Will also co dose with 10 mg of Oxycodone now.  If this is not effective to control pain and spasms plan to send to the ED for better controll.  Additional clarification orders for oxycodone and home CPAP written.   Complete Medication List: 1)  Amlodipine Besylate 5 Mg Tabs (Amlodipine besylate) .... Take 1 by mouth qd 2)  Losartan Potassium 50 Mg Tabs (Losartan potassium) .Marland Kitchen.. 1 by mouth once daily 3)  Lovaza 1 Gm Caps (Omega-3-acid ethyl esters) .... Take one by mouth two times a day 4)  Pristiq 50 Mg Xr24h-tab (Desvenlafaxine succinate) .Marland Kitchen.. 1 by mouth once daily 5)  Celexa 40 Mg Tabs (Citalopram hydrobromide) .... Take one by mouth at bedtime 6)  Nexium 40 Mg Cpdr (Esomeprazole magnesium) .... Take one by mouth at bedtime 7)  Cyclobenzaprine Hcl 10 Mg Tabs (Cyclobenzaprine hcl) .... Take one by mouth once daily 8)  Trazodone Hcl 100 Mg Tabs (Trazodone hcl) .... Take one by mouth at bedtime 9)  Requip 4 Mg Tabs (Ropinirole hcl) .Marland Kitchen.. 1 by mouth at bedtime 10)  Lasix 20 Mg Tabs (Furosemide) .... Take one by mouth once daily 11)  Potassium Gluconate 595 Mg Tabs  (Potassium gluconate) .... Tale 1 by mouth qd 12)  Fentanyl 100 Mcg/hr Pt72 (Fentanyl) .... Change q 3 days 13)  Xanax 0.5 Mg Tabs (Alprazolam) .... As needed 14)  Diclofenac Sodium 75 Mg Tbec (Diclofenac sodium) .... Take 1 po qd 15)  Allegra 180 Mg Tabs (Fexofenadine hcl) .... Take 1 by mouth once daily as needed 16)  Promethazine Hcl 25 Mg Tabs (Promethazine hcl) .... Take one by mouth at bedtime 17)  Triamcinolone Acetonide 0.1 % Crea (Triamcinolone acetonide) .... Appy as directed 18)  Vagifem 10 Mcg Tabs (Estradiol) .... Take 1 at bedtime mon, wed and fri  Appended Document: Muscle Spasms I called [facility] and she was sent to the ER at her request this AM. No ER report in [facility] yet

## 2010-12-21 NOTE — Letter (Signed)
Summary: Aundra Dubin MD  Aundra Dubin MD   Imported By: Sherian Rein 03/22/2010 09:34:04  _____________________________________________________________________  External Attachment:    Type:   Image     Comment:   External Document

## 2010-12-21 NOTE — Letter (Signed)
Summary: Urology/[facility]   Imported By: Sherian Rein 06/21/2010 14:02:09  _____________________________________________________________________  External Attachment:    Type:   Image     Comment:   External Document

## 2010-12-21 NOTE — Assessment & Plan Note (Signed)
Summary: walking difficulty  per pt  stc   Vital Signs:  Patient profile:   55 year old female Height:      66 inches (167.64 cm) Weight:      231.8 pounds (105.36 kg) O2 Sat:      97 % on Room air Temp:     98.9 degrees F (37.17 degrees C) oral Pulse rate:   90 / minute BP sitting:   118 / 72  (left arm) Cuff size:   large  Vitals Entered By: Orlan Leavens (January 30, 2010 3:58 PM)  O2 Flow:  Room air CC: Difficulty walking, also have FMLA form that need to be filled out Is Patient Diabetic? No Pain Assessment Patient in pain? no        Primary Care Provider:  Newt Lukes MD  CC:  Difficulty walking and also have FMLA form that need to be filled out.  History of Present Illness: here with c/o right leg pain - causes pain to walk or stand - pain is 9-10/10 on most days since flare, baseline 6-7/10 pain worse in last few days than usual- ?related to fibromyalgia or other arthritis ("years ago they thought i had rheumatoid") and would like rheum eval for same prev followed with ortho but a) has no ortho coverage with current insurance and b) "nothing else they can do right now because it is bone on bone and only a surg could help" denies injury or falls - no fever, no joint swelling in hand, knees, wrist or feet - no other new areas of pain - has missed work becuase of pain - needs FMLA filled out - needs restriction to 20h/wk  Current Medications (verified): 1)  Metformin Hcl 1000 Mg Tabs (Metformin Hcl) .... Take 1 Two Times A Day 2)  Amlodipine Besylate 5 Mg Tabs (Amlodipine Besylate) .... Take 1 By Mouth Qd 3)  Benicar 20 Mg Tabs (Olmesartan Medoxomil) .... Take One By Mouth Once Daily 4)  Lovaza 1 Gm Caps (Omega-3-Acid Ethyl Esters) .... Take One By Mouth Two Times A Day 5)  Pristiq 50 Mg Xr24h-Tab (Desvenlafaxine Succinate) .... Take 1 By Mouth Once Daily 6)  Celexa 40 Mg Tabs (Citalopram Hydrobromide) .... Take One By Mouth At Bedtime 7)  Nexium 40 Mg Cpdr  (Esomeprazole Magnesium) .... Take One By Mouth At Bedtime 8)  Cyclobenzaprine Hcl 10 Mg Tabs (Cyclobenzaprine Hcl) .... Take One By Mouth Once Daily 9)  Trazodone Hcl 100 Mg Tabs (Trazodone Hcl) .... Take One By Mouth At Bedtime 10)  Requip 2 Mg Tabs (Ropinirole Hcl) .... Take One By Mouth Once Daily 11)  Lasix 20 Mg Tabs (Furosemide) .... Take One By Mouth Once Daily 12)  Potassium Gluconate 595 Mg Tabs (Potassium Gluconate) .... Tale 1 By Mouth Qd 13)  Fentanyl 100 Mcg/hr Pt72 (Fentanyl) .... Change Q 3 Days 14)  Xanax 0.5 Mg Tabs (Alprazolam) .... As Needed 15)  Diclofenac Sodium 75 Mg Tbec (Diclofenac Sodium) .... Take 1 Po Qd 16)  Allegra 180 Mg Tabs (Fexofenadine Hcl) .... Take 1 By Mouth Once Daily As Needed 17)  Promethazine Hcl 25 Mg Tabs (Promethazine Hcl) .... Take One By Mouth At Bedtime 18)  Triamcinolone Acetonide 0.1 % Crea (Triamcinolone Acetonide) .... Appy As Directed  Allergies (verified): 1)  ! Macrobid 2)  ! Darvocet 3)  ! Flagyl 4)  ! Lopid 5)  ! * Altace 6)  ! * Benicar Hct 7)  ! Parafon Forte 8)  ! Neurontin  9)  ! Sulfa 10)  ! * Elmiron 11)  ! * Ramipril  Past History:  Past medical, surgical, family and social histories (including risk factors) reviewed, and no changes noted (except as noted below).  Past Medical History: Anxiety Disorder Depression Diabetes type 2 Hypertension Dyslipidemia Fibromyalgia Restless leg syndrome Interstitial cystitis Chronic fatigue syndrome Irritable Bowel Syndrome Chronic abdominal pain Allergic rhinitis Diverticulitis, hx of GERD thyroid nodules/goiter    physican rooster - [facility] E[facility] gyn-[facility] NSurg-[facility] - Supple, then [facility], none now  Past Surgical History: Reviewed history from 10/11/2009 and no changes required. Ventral hernia repair with MESH...Dr. Jamey Ripa... Appendectomy (1610) Hysterectomy (1992) Breast biopsy (1978) Tonsillectomy  (1970) Laparotomy-exploratory, right ovary 1982 surgery on her nose & sinus (12/29/02) Rectocele reapir (07/20/08) Neck surgery, cervical fusion (01/04/09) bladder hydrodistension 11/2008,06/2007,10/2006,05/2005,09/2004,01/2004,04/2003,08/2002 tumor removed from scalp (01/1992 and 1998)  Family History: Reviewed history from 10/11/2009 and no changes required. Family History of Breast Cancer:grandmother No FH of Colon Cancer: Family History of Diabetes: father Family History of Arthritis (other relative) Family History Hypertension (parent) Migraines (parent)  Social History: Reviewed history from 10/11/2009 and no changes required. Occupation: disabled, works part time at [employer] Divorced lives alone - has one daughter and one son. Patient has never smoked.  Alcohol Use - yes Illicit Drug Use - no  Review of Systems       The patient complains of difficulty walking.  The patient denies weight loss, chest pain, headaches, abdominal pain, muscle weakness, and suspicious skin lesions.         see HPI above. I have reviewed all other systems and they were negative.   Physical Exam  General:  overweight-appearing.  alert, well-developed, well-nourished, and cooperative to examination.   Lungs:  normal respiratory effort, no intercostal retractions or use of accessory muscles; normal breath sounds bilaterally - no crackles and no wheezes.    Heart:  normal rate, regular rhythm, no murmur, and no rub. BLE without edema.  Msk:  R hip: full range of motion. No deep groin pain. nontender over greater trochanter.  Gait cautious but unaided and independant Neurologic:  alert & oriented X3 and cranial nerves II-XII symetrically intact.  strength normal in all extremities, sensation intact to light touch, and gait normal (see above). speech fluent without dysarthria or aphasia; follows commands with good comprehension.  Psych:  Oriented X3, memory intact for recent and remote, normally  interactive, good eye contact, not anxious appearing, not depressed appearing, and not agitated.      Impression & Recommendations:  Problem # 1:  HIP PAIN, LEFT (ICD-719.45)  ?osteoarth vs. FM vs other (pt reports remote dx RA?) refer to rheum to help sort out - FMLA signed and filled out as requested to reduce hours to 20h/wk, no more than 3d working per week Time spent with patient 40 minutes, more than 50% of this time was spent counseling patient on pain mgmt, need for consultants and completion of FMLA paperwork in office today Her updated medication list for this problem includes:    Cyclobenzaprine Hcl 10 Mg Tabs (Cyclobenzaprine hcl) .Marland Kitchen... Take one by mouth once daily    Fentanyl 100 Mcg/hr Pt72 (Fentanyl) .Marland Kitchen... Change q 3 days    Diclofenac Sodium 75 Mg Tbec (Diclofenac sodium) .Marland Kitchen... Take 1 po qd  Orders: Rheumatology Referral (Rheumatology)  Problem # 2:  FIBROMYALGIA (ICD-729.1)  Her updated medication list for this problem includes:    Cyclobenzaprine Hcl 10 Mg Tabs (Cyclobenzaprine hcl) .Marland KitchenMarland KitchenMarland KitchenMarland Kitchen  Take one by mouth once daily    Fentanyl 100 Mcg/hr Pt72 (Fentanyl) .Marland Kitchen... Change q 3 days    Diclofenac Sodium 75 Mg Tbec (Diclofenac sodium) .Marland Kitchen... Take 1 po qd  Complete Medication List: 1)  Metformin Hcl 1000 Mg Tabs (Metformin hcl) .... Take 1 two times a day 2)  Amlodipine Besylate 5 Mg Tabs (Amlodipine besylate) .... Take 1 by mouth qd 3)  Benicar 20 Mg Tabs (Olmesartan medoxomil) .... Take one by mouth once daily 4)  Lovaza 1 Gm Caps (Omega-3-acid ethyl esters) .... Take one by mouth two times a day 5)  Pristiq 50 Mg Xr24h-tab (Desvenlafaxine succinate) .... Take 1 by mouth once daily 6)  Celexa 40 Mg Tabs (Citalopram hydrobromide) .... Take one by mouth at bedtime 7)  Nexium 40 Mg Cpdr (Esomeprazole magnesium) .... Take one by mouth at bedtime 8)  Cyclobenzaprine Hcl 10 Mg Tabs (Cyclobenzaprine hcl) .... Take one by mouth once daily 9)  Trazodone Hcl 100 Mg Tabs (Trazodone  hcl) .... Take one by mouth at bedtime 10)  Requip 2 Mg Tabs (Ropinirole hcl) .... Take one by mouth once daily 11)  Lasix 20 Mg Tabs (Furosemide) .... Take one by mouth once daily 12)  Potassium Gluconate 595 Mg Tabs (Potassium gluconate) .... Tale 1 by mouth qd 13)  Fentanyl 100 Mcg/hr Pt72 (Fentanyl) .... Change q 3 days 14)  Xanax 0.5 Mg Tabs (Alprazolam) .... As needed 15)  Diclofenac Sodium 75 Mg Tbec (Diclofenac sodium) .... Take 1 po qd 16)  Allegra 180 Mg Tabs (Fexofenadine hcl) .... Take 1 by mouth once daily as needed 17)  Promethazine Hcl 25 Mg Tabs (Promethazine hcl) .... Take one by mouth at bedtime 18)  Triamcinolone Acetonide 0.1 % Crea (Triamcinolone acetonide) .... Appy as directed  Patient Instructions: 1)  it was good to see you today. 2)  we'll make referral to rheumatology for evaluation of your arthrtitis and pain. Our office will contact you regarding this appointment once made.  3)  Completion of your FMLA form as requested - 4)  Please schedule a follow-up appointment as needed.

## 2010-12-21 NOTE — Miscellaneous (Signed)
Summary: Health Care Power of [facility] Power of Attorney   Imported By: Lanelle Bal 07/21/2010 13:40:52  _____________________________________________________________________  External Attachment:    Type:   Image     Comment:   External Document

## 2010-12-21 NOTE — Letter (Signed)
Summary: [facility]  [facility]   Imported By: Sherian Rein 03/06/2010 11:31:56  _____________________________________________________________________  External Attachment:    Type:   Image     Comment:   External Document

## 2010-12-21 NOTE — Progress Notes (Signed)
Summary: Faxed Records  Faxed EKG to [facility] at [facility].  Marylou Mccoy  August 21, 2010 1:47 PM

## 2010-12-21 NOTE — Progress Notes (Signed)
  Phone Note Call from Patient   Summary of Call: Xanax and oxycodone not effective for muscle spasms. Pt send to ED.  Initial call taken by: Clementeen Graham MD,  August 04, 2010 7:45 AM

## 2010-12-21 NOTE — Letter (Signed)
Summary: Pre Op Clearance/[facility]  Pre Op Clearance/[facility]   Imported By: Sherian Rein 07/19/2010 10:14:29  _____________________________________________________________________  External Attachment:    Type:   Image     Comment:   External Document

## 2010-12-21 NOTE — Letter (Signed)
Summary: [facility]  [address] [place], [place] [address]   Phone: [phone]  Fax: [phone]    Today's Date: January 30, 2010  Name of Patient: [name]  The above named patient had a medical visit today 01/30/10  Please take this into consideration when reviewing the time away from work. Pt can return to work tomorrow 01/31/10     Sincerely yours,   Dr. Rene Paci

## 2010-12-21 NOTE — Letter (Signed)
Summary: Out of Work  [facility]  [address] [place], [place] [address]   Phone: [phone]  Fax: [phone]    January 16, 2010   Employee:  [name] [facility]    To Whom It May Concern:   For Medical reasons, please excuse the above named employee from work for the following dates:  Start:   January 05 2010  End:   January 16 2010 -- To return January 17 2010  If you need additional information, please feel free to contact our office.         Sincerely,    Rene Paci MD

## 2010-12-21 NOTE — Progress Notes (Signed)
Summary: d/c coumadin  Phone Note From Other Clinic   Caller: Fax from Nursing [email or web address] Charmian [place] Summary of Call: MD recieved fax requesting if patient should continue coumadin and if so will need oder with dosage and when to repeat labs. MD respond back No reason from my view for patient to remain on coumadin. May d/c & no other lab test needed at this time. Faxed form back to [phone]. Updated EMR Initial call taken by: Orlan Leavens RMA,  October 05, 2010 9:25 AM

## 2010-12-21 NOTE — Letter (Signed)
Summary: Urology/[facility]   Imported By: Sherian Rein 11/03/2010 07:52:20  _____________________________________________________________________  External Attachment:    Type:   Image     Comment:   External Document

## 2010-12-21 NOTE — Progress Notes (Signed)
Summary: Triage  Phone Note Call from Patient Call back at Home Phone [phone]   Caller: Patient Call For: Dr. Jarold Motto Reason for Call: Talk to Nurse Summary of Call: wants to sch'd a COL at the hosp. The meds used here does not work for her Initial call taken by: Karna Christmas,  June 09, 2010 11:18 AM    dR/bRODIE WOULD NOT ACCEPT GER CARE,,,SHE CAN SEEK ANOTHER GI DOC....i WILL NOT CONTINUE WITH HER....  Appended Document: Triage Chart ordered and reviewed.  No record of pt ever having a colonoscopy in the [facility].  LM for pt to call.  Appended Document: Triage Talked with pt.  Pt states her last colon was done by Dr. Madilyn Fireman.  States he had a hard time sedating her and she was uncomfortable during the proc.  Was told that for next colon would need to be done at the hospital.  Pt states it is time for another colon and she is having some abd discomfort.  Pt is sch to have knee relpacement on September 12.  Asks if it would be possible to have colon done before surgery?  Pt states that id does not matter to her who does the proc.  Had wanted to switch to Dr. Juanda Chance becaues she is a woman and sees her mother but she understands that Dr. Regino Schultze sch if full.    Appended Document: Triage Dr. Arlyce Dice has agreed to do pt's colon at [facility] with profolol.  First available appt would not be until week on Sept 19.  Pt notified.  SHe is having knee surgery on Sept 12.  Pt will call back after she has recovered form knee surgery to sch procedure.

## 2010-12-21 NOTE — Progress Notes (Signed)
Summary: PA-Pristiq 50mg    Phone Note Call from Patient   Action Taken: Appt Scheduled Today Summary of Call: Need to call [phone] ([employer])  Member ID# [id number]. Pt need prior authorization on Prestiq 50mg .Marland KitchenMarland KitchenAlvy Beal Archie CMA  June 09, 2010 4:53 PM   Follow-up for Phone Call        PA-Pristiq 50mg  Faxed to [facility] @ [phone], awaiting approval. Follow-up by: Dagoberto Reef,  June 12, 2010 4:07 PM  Additional Follow-up for Phone Call Additional follow up Details #1::        Pristiq 50 mg -approved from 06/13/10 until 06/14/11,  pt aware. Additional Follow-up by: Dagoberto Reef,  June 13, 2010 9:07 AM

## 2010-12-21 NOTE — Medication Information (Signed)
Summary: Approval and P A /[facility]  Approval and P A /[facility]   Imported By: Lester Martin 06/14/2010 10:43:12  _____________________________________________________________________  External Attachment:    Type:   Image     Comment:   External Document

## 2010-12-21 NOTE — Assessment & Plan Note (Signed)
Summary: post rehab/knee replacements-lb   Vital Signs:  Patient profile:   55 year old female Height:      66 inches (167.64 cm) Weight:      243.8 pounds (110.82 kg) O2 Sat:      96 % on Room air Temp:     98.3 degrees F (36.83 degrees C) oral Pulse rate:   87 / minute BP sitting:   158 / 84  (left arm) Cuff size:   large  Vitals Entered By: Orlan Leavens RMA (October 17, 2010 4:04 PM)  O2 Flow:  Room air CC: F/u on rehab Is Patient Diabetic? Yes Did you bring your meter with you today? No Pain Assessment Patient in pain? yes     Location: knee Type: aching   Primary Care Provider:  Newt Lukes MD  CC:  F/u on rehab.  History of Present Illness: here for f/u  s/p B TKA with dr. Charlann Boxer due to adv OA and chronic knee pain/swelling -07/31/10 left, oct 2011 for right hosp and rehab course reviewed - home since 09/24/10 and HHPT - improving slow requests antiinflam to use with narc due nighttime pain symptoms and pain with walking  depression - prev doing well with pristique but tried change to generic effexor for better insurance coverage - caused sedation and sweating - changed back - now overall feeling better and has lost weight on current tx - no tearfulness or sadness  HTN - exac now by pain (see above) - no CP, no SOB, no edema - reports compliance with ongoing medical treatment and no changes in medication dose or frequency. denies adverse side effects related to current therapy.   RLS - has been on requip 4mg  for symptoms - not controlled on 2mg  dose - reports compliance with ongoing medical treatment and no changes in medication dose or frequency. denies adverse side effects related to current therapy.   DM2, diet controlled  - prev on metformin but reports cbg improved s/p weight loss -  Clinical Review Panels:  Diabetes Management   HgBA1C:  6.1 (05/10/2010)   Creatinine:  0.8 (05/10/2010)   Last Flu Vaccine:  Historical (08/19/2009)  CBC   WBC:  6.9  (10/11/2009)   RBC:  4.55 (10/11/2009)   Hgb:  13.6 (10/11/2009)   Hct:  40.6 (10/11/2009)   Platelets:  196.0 (10/11/2009)   MCV  89.3 (10/11/2009)   MCHC  33.5 (10/11/2009)   RDW  12.8 (10/11/2009)   PMN:  70.4 (10/11/2009)   Lymphs:  21.8 (10/11/2009)   Monos:  6.5 (10/11/2009)   Eosinophils:  0.9 (10/11/2009)   Basophil:  0.4 (10/11/2009)  Complete Metabolic Panel   Glucose:  138 (05/10/2010)   Sodium:  141 (05/10/2010)   Potassium:  4.4 (05/10/2010)   Chloride:  103 (05/10/2010)   CO2:  31 (05/10/2010)   BUN:  24 (05/10/2010)   Creatinine:  0.8 (05/10/2010)   Albumin:  4.0 (05/26/2009)   Total Protein:  6.4 (05/26/2009)   Calcium:  9.7 (05/10/2010)   Total Bili:  0.3 (05/26/2009)   Alk Phos:  76 (05/26/2009)   SGPT (ALT):  11 (05/26/2009)   SGOT (AST):  13 (05/26/2009)   Current Medications (verified): 1)  Amlodipine Besylate 5 Mg Tabs (Amlodipine Besylate) .... Take 1 By Mouth Qd 2)  Losartan Potassium 50 Mg Tabs (Losartan Potassium) .Marland Kitchen.. 1 By Mouth Once Daily 3)  Lovaza 1 Gm Caps (Omega-3-Acid Ethyl Esters) .... Take One By Mouth Two Times A Day 4)  Pristiq 50 Mg Xr24h-Tab (Desvenlafaxine Succinate) .Marland Kitchen.. 1 By Mouth Once Daily 5)  Celexa 40 Mg Tabs (Citalopram Hydrobromide) .... Take One By Mouth At Bedtime 6)  Nexium 40 Mg Cpdr (Esomeprazole Magnesium) .... Take One By Mouth At Bedtime 7)  Baclofen 10 Mg Tabs (Baclofen) .Marland Kitchen.. 1 Tab By Mouth Three Times A Day As Needed Pain 8)  Trazodone Hcl 100 Mg Tabs (Trazodone Hcl) .... Take One By Mouth At Bedtime 9)  Requip 4 Mg Tabs (Ropinirole Hcl) .Marland Kitchen.. 1 By Mouth At Bedtime 10)  Lasix 20 Mg Tabs (Furosemide) .... Take One By Mouth Once Daily 11)  Potassium Gluconate 595 Mg Tabs (Potassium Gluconate) .... Tale 1 By Mouth Qd 12)  Fentanyl 100 Mcg/hr Pt72 (Fentanyl) .... Change Q 2 Days 13)  Alprazolam 1 Mg Tabs (Alprazolam) .... A Tab By Mouth Three Times A Day Prn 14)  Allegra 180 Mg Tabs (Fexofenadine Hcl) .... Take 1 By  Mouth Once Daily As Needed 15)  Promethazine Hcl 25 Mg Tabs (Promethazine Hcl) .... Take One By Mouth At Bedtime 16)  Vagifem 10 Mcg Tabs (Estradiol) .... Take 1 At Bedtime Mon, Wed and Fri  Allergies (verified): 1)  ! Macrobid 2)  ! Darvocet 3)  ! Flagyl 4)  ! Lopid 5)  ! * Altace 6)  ! * Benicar Hct 7)  ! Parafon Forte 8)  ! Neurontin 9)  ! Sulfa 10)  ! * Elmiron 11)  ! * Ramipril  Past History:  Past Medical History: Anxiety Disorder Depression Diabetes type 2, diet controlled Hypertension Dyslipidemia Fibromyalgia Restless leg syndrome Interstitial cystitis  Chronic fatigue syndrome Irritable Bowel Syndrome Chronic abdominal pain Allergic rhinitis Diverticulitis, hx of GERD thyroid nodules/goiter   osteoarthritis, knee - s/p B TKR fall 2011    physican roster - uroLogan Bores ENT-Wolicki gyn-[facility] NSurg-Krit[facility] GI-[facility] ortho - [place], then [place], now [place]  Past Surgical History: Ventral hernia repair with MESH...Dr. Jamey Ripa... Appendectomy (1610) Hysterectomy (1992) Breast biopsy (1978) Tonsillectomy (1970) Laparotomy-exploratory, right ovary 1982 surgery on her nose & sinus (12/29/02) Rectocele reapir (07/20/08) Neck surgery, cervical fusion (01/04/09) bladder hydrodistension 11/2008,06/2007,10/2006,05/2005,09/2004,01/2004,04/2003,08/2002 tumor removed from scalp (01/1992 and 1998) Total knee replacement, bilateral - left 07/2010, right 08/2010  Review of Systems       The patient complains of difficulty walking.  The patient denies chest pain and syncope.    Physical Exam  General:  alert, well-developed, well-nourished, and cooperative to examination.    Lungs:  normal respiratory effort, no intercostal retractions or use of accessory muscles; normal breath sounds bilaterally - no crackles and no wheezes.    Heart:  normal rate, regular rhythm, no murmur, and no rub. BLE without edema.  Msk:  B knee wit well healed anterior TKR scar - R>L swelling  but not red/hot   Impression & Recommendations:  Problem # 1:  ARTHRITIS, KNEES, BILATERAL (ICD-716.98)  s/p B TRK 07/2010 and 08/2010 - hosp/rehab reviewed today - doing well since home add meloxicam to chronic narcs for at bedtime use - f/o ortho as planned and cont PT  Orders: Prescription Created Electronically [phone])  Problem # 2:  DIABETES (ICD-250.00)  Her updated medication list for this problem includes:    Losartan Potassium 50 Mg Tabs (Losartan potassium) .Marland Kitchen... 1 by mouth once daily  Diet controlled  Labs Reviewed: Creat: 0.8 (05/10/2010)    Reviewed HgBA1c results: 6.1 (05/10/2010)  6.1 (10/11/2009)  Problem # 3:  HYPERTENSION (ICD-401.9)  Her updated medication list for this problem includes:  Amlodipine Besylate 5 Mg Tabs (Amlodipine besylate) .Marland Kitchen... Take 1 by mouth qd    Losartan Potassium 50 Mg Tabs (Losartan potassium) .Marland Kitchen... 1 by mouth once daily    Lasix 20 Mg Tabs (Furosemide) .Marland Kitchen... Take one by mouth once daily  prev 04/2010 changed ARB to generic losartan (intol of ACEI and HCTZ) was improved until exac by pain - cont same for now, recheck next  OV before making other med changes  BP today: 158/84 Prior BP: 130/82 (07/14/2010)  Labs Reviewed: K+: 4.4 (05/10/2010) Creat: : 0.8 (05/10/2010)   Chol: 206 (10/11/2009)   HDL: 53.10 (10/11/2009)   LDL: 25 (05/26/2009)   TG: 124.0 (10/11/2009)  Complete Medication List: 1)  Amlodipine Besylate 5 Mg Tabs (Amlodipine besylate) .... Take 1 by mouth qd 2)  Losartan Potassium 50 Mg Tabs (Losartan potassium) .Marland Kitchen.. 1 by mouth once daily 3)  Lovaza 1 Gm Caps (Omega-3-acid ethyl esters) .... Take one by mouth two times a day 4)  Pristiq 50 Mg Xr24h-tab (Desvenlafaxine succinate) .Marland Kitchen.. 1 by mouth once daily 5)  Celexa 40 Mg Tabs (Citalopram hydrobromide) .... Take one by mouth at bedtime 6)  Nexium 40 Mg Cpdr (Esomeprazole magnesium) .... Take one by mouth at bedtime 7)  Baclofen 10 Mg Tabs (Baclofen) .Marland Kitchen.. 1 tab by  mouth three times a day as needed pain 8)  Trazodone Hcl 100 Mg Tabs (Trazodone hcl) .... Take one by mouth at bedtime 9)  Requip 4 Mg Tabs (Ropinirole hcl) .Marland Kitchen.. 1 by mouth at bedtime 10)  Lasix 20 Mg Tabs (Furosemide) .... Take one by mouth once daily 11)  Potassium Gluconate 595 Mg Tabs (Potassium gluconate) .... Tale 1 by mouth qd 12)  Fentanyl 100 Mcg/hr Pt72 (Fentanyl) .... Change q 2 days 13)  Alprazolam 1 Mg Tabs (Alprazolam) .... A tab by mouth three times a day prn 14)  Allegra 180 Mg Tabs (Fexofenadine hcl) .... Take 1 by mouth once daily as needed 15)  Promethazine Hcl 25 Mg Tabs (Promethazine hcl) .... Take one by mouth at bedtime 16)  Vagifem 10 Mcg Tabs (Estradiol) .... Take 1 at bedtime mon, wed and fri 17)  Meloxicam 15 Mg Tabs (Meloxicam) .Marland Kitchen.. 1 by mouth once daily as needed for pain symptoms  Patient Instructions: 1)  it was good to see you today. 2)  meloxicam to use with narcotics as needed for pain - your prescription has been electronically submitted to your pharmacy. Please take as directed. Contact our office if you believe you're having problems with the medication(s).  3)  keep Jan appt as scheduled for diabetes labs, call sooner if problems Prescriptions: MELOXICAM 15 MG TABS (MELOXICAM) 1 by mouth once daily as needed for pain symptoms  #30 x 3   Entered and Authorized by:   Newt Lukes MD   Signed by:   Newt Lukes MD on 10/17/2010   Method used:   [facility]  [address] [id number]       [address]       [place], [place]  [address]       Ph: [phone] or [phone]       Fax: [phone]   RxID:   [id number]    Orders Added: 1)  Est. Patient Level IV [36644] 2)  Prescription Created Electronically [phone]

## 2010-12-21 NOTE — Assessment & Plan Note (Signed)
Summary: pre-operative clearance/cd   Vital Signs:  Patient profile:   55 year old female Height:      66 inches (167.64 cm) Weight:      241.4 pounds (109.73 kg) BMI:     39.10 O2 Sat:      98 % on Room air Temp:     98.8 degrees F (37.11 degrees C) oral Pulse rate:   81 / minute BP sitting:   130 / 82  (left arm) Cuff size:   large  Vitals Entered By: Orlan Leavens RMA (July 14, 2010 2:05 PM)  O2 Flow:  Room air CC: Medical clearance Is Patient Diabetic? Yes Did you bring your meter with you today? No Pain Assessment Patient in pain? no      Comments Pt also req refill on nexium   Primary Care Provider:  Newt Lukes MD  CC:  Medical clearance.  History of Present Illness: here for preop eval - planning left TKA with dr. Charlann Boxer due to adv OA and chronic knee pain/swelling sched 07/31/10 left, oct 2011 for right no CP, syncope or TIA symptoms -  also reviewed chronic med issues: 1) depression - prev doing well with pristique but tried change to generic effexor for better insurance coverage - caused sedation and sweating - changed back - now overall feeling better and has lost weight on current tx - no tearfulness or sadness  2) HTN - no CP, no SOB, no edema - reports compliance with ongoing medical treatment and no changes in medication dose or frequency. denies adverse side effects related to current therapy.   3) RLS - has been on requip 4mg  for symptoms - not controlled on 2mg  dose -   4) DM2 - not taking metformin - reports cbg better with weight loss - ?still need to take any meds  Preventive Screening-Counseling & Management  Alcohol-Tobacco     Alcohol drinks/day: <1     Alcohol type: beer     Smoking Status: never  Clinical Review Panels:  Prevention   Last Mammogram:  BI-RADS CATEGORY 3:  Probably benign finding(s) - short interval^MM DIGITAL DIAGNOSTIC BILAT (11/17/2009)  Immunizations   Last Flu Vaccine:  Historical (08/19/2009)  Lipid  Management   Cholesterol:  206 (10/11/2009)   LDL (bad choesterol):  118 (05/26/2009)   HDL (good cholesterol):  53.10 (10/11/2009)   Triglycerides:  127 (05/26/2009)  Diabetes Management   HgBA1C:  6.1 (05/10/2010)   Creatinine:  0.8 (05/10/2010)   Last Flu Vaccine:  Historical (08/19/2009)  CBC   WBC:  6.9 (10/11/2009)   RBC:  4.55 (10/11/2009)   Hgb:  13.6 (10/11/2009)   Hct:  40.6 (10/11/2009)   Platelets:  196.0 (10/11/2009)   MCV  89.3 (10/11/2009)   MCHC  33.5 (10/11/2009)   RDW  12.8 (10/11/2009)   PMN:  70.4 (10/11/2009)   Lymphs:  21.8 (10/11/2009)   Monos:  6.5 (10/11/2009)   Eosinophils:  0.9 (10/11/2009)   Basophil:  0.4 (10/11/2009)  Complete Metabolic Panel   Glucose:  138 (05/10/2010)   Sodium:  141 (05/10/2010)   Potassium:  4.4 (05/10/2010)   Chloride:  103 (05/10/2010)   CO2:  31 (05/10/2010)   BUN:  24 (05/10/2010)   Creatinine:  0.8 (05/10/2010)   Albumin:  4.0 (05/26/2009)   Total Protein:  6.4 (05/26/2009)   Calcium:  9.7 (05/10/2010)   Total Bili:  0.3 (05/26/2009)   Alk Phos:  76 (05/26/2009)   SGPT (ALT):  11 (05/26/2009)  SGOT (AST):  13 (05/26/2009)   Current Medications (verified): 1)  Metformin Hcl 1000 Mg Tabs (Metformin Hcl) .... Take 1 Two Times A Day 2)  Amlodipine Besylate 5 Mg Tabs (Amlodipine Besylate) .... Take 1 By Mouth Qd 3)  Losartan Potassium 50 Mg Tabs (Losartan Potassium) .Marland Kitchen.. 1 By Mouth Once Daily 4)  Lovaza 1 Gm Caps (Omega-3-Acid Ethyl Esters) .... Take One By Mouth Two Times A Day 5)  Pristiq 50 Mg Xr24h-Tab (Desvenlafaxine Succinate) .Marland Kitchen.. 1 By Mouth Once Daily 6)  Celexa 40 Mg Tabs (Citalopram Hydrobromide) .... Take One By Mouth At Bedtime 7)  Nexium 40 Mg Cpdr (Esomeprazole Magnesium) .... Take One By Mouth At Bedtime 8)  Cyclobenzaprine Hcl 10 Mg Tabs (Cyclobenzaprine Hcl) .... Take One By Mouth Once Daily 9)  Trazodone Hcl 100 Mg Tabs (Trazodone Hcl) .... Take One By Mouth At Bedtime 10)  Requip 4 Mg Tabs  (Ropinirole Hcl) .Marland Kitchen.. 1 By Mouth At Bedtime 11)  Lasix 20 Mg Tabs (Furosemide) .... Take One By Mouth Once Daily 12)  Potassium Gluconate 595 Mg Tabs (Potassium Gluconate) .... Tale 1 By Mouth Qd 13)  Fentanyl 100 Mcg/hr Pt72 (Fentanyl) .... Change Q 3 Days 14)  Xanax 0.5 Mg Tabs (Alprazolam) .... As Needed 15)  Diclofenac Sodium 75 Mg Tbec (Diclofenac Sodium) .... Take 1 Po Qd 16)  Allegra 180 Mg Tabs (Fexofenadine Hcl) .... Take 1 By Mouth Once Daily As Needed 17)  Promethazine Hcl 25 Mg Tabs (Promethazine Hcl) .... Take One By Mouth At Bedtime 18)  Triamcinolone Acetonide 0.1 % Crea (Triamcinolone Acetonide) .... Appy As Directed 19)  Vagifem 10 Mcg Tabs (Estradiol) .... Take 1 At Bedtime Mon, Wed and Fri  Allergies (verified): 1)  ! Macrobid 2)  ! Darvocet 3)  ! Flagyl 4)  ! Lopid 5)  ! * Altace 6)  ! * Benicar Hct 7)  ! Parafon Forte 8)  ! Neurontin 9)  ! Sulfa 10)  ! * Elmiron 11)  ! * Ramipril  Past History:  Past medical, surgical, family and social histories (including risk factors) reviewed, and no changes noted (except as noted below).  Past Medical History: Anxiety Disorder Depression Diabetes type 2 Hypertension Dyslipidemia Fibromyalgia Restless leg syndrome Interstitial cystitis  Chronic fatigue syndrome Irritable Bowel Syndrome Chronic abdominal pain Allergic rhinitis Diverticulitis, hx of GERD thyroid nodules/goiter   osteoarthritis, knee -left   physican roster - uroLogan Bores ENT-Wolicki gyn-[facility] NSurg-Krit[facility] GI-[facility] ortho - [place], then [place], now [place]  Past Surgical History: Reviewed history from 10/11/2009 and no changes required. Ventral hernia repair with MESH...Dr. Jamey Ripa... Appendectomy (1610) Hysterectomy (1992) Breast biopsy (1978) Tonsillectomy (1970) Laparotomy-exploratory, right ovary 1982 surgery on her nose & sinus (12/29/02) Rectocele reapir (07/20/08) Neck surgery, cervical fusion (01/04/09) bladder hydrodistension  11/2008,06/2007,10/2006,05/2005,09/2004,01/2004,04/2003,08/2002 tumor removed from scalp (01/1992 and 1998)  Family History: Reviewed history from 10/11/2009 and no changes required. Family History of Breast Cancer:grandmother No FH of Colon Cancer: Family History of Diabetes: father Family History of Arthritis (other relative) Family History Hypertension (parent) Migraines (parent)  Social History: Reviewed history from 10/11/2009 and no changes required. Occupation: disabled, works part time at [employer] Divorced lives alone - has one daughter and one son. Patient has never smoked.  Alcohol Use - yes Illicit Drug Use - no  Review of Systems  The patient denies fever, weight gain, vision loss, decreased hearing, hoarseness, chest pain, syncope, dyspnea on exertion, peripheral edema, prolonged cough, headaches, hemoptysis, abdominal pain, melena, hematochezia, severe indigestion/heartburn,  hematuria, incontinence, genital sores, muscle weakness, suspicious skin lesions, depression, unusual weight change, and enlarged lymph nodes.         also, see HPI above. I have reviewed all other systems and they were negative.   Physical Exam  General:  overweight-appearing.  alert, well-developed, well-nourished, and cooperative to examination.   Eyes:  vision grossly intact; pupils equal, round and reactive to light.  conjunctiva and lids normal.   wears glasses Ears:  normal pinnae bilaterally, without erythema, swelling, or tenderness to palpation. TMs clear, without effusion, or cerumen impaction. Hearing grossly normal bilaterally  Mouth:  teeth and gums in good repair; mucous membranes moist, without lesions or ulcers. oropharynx clear without exudate, no erythema.  Neck:  well healed scarring right ant side - thick, supple, full ROM, no masses, no thyromegaly; no thyroid nodules or tenderness. no JVD or carotid bruits.   Lungs:  normal respiratory effort, no intercostal retractions  or use of accessory muscles; normal breath sounds bilaterally - no crackles and no wheezes.    Heart:  normal rate, regular rhythm, no murmur, and no rub. BLE without edema.  Neurologic:  alert & oriented X3 and cranial nerves II-XII symetrically intact.  strength normal in all extremities, sensation intact to light touch, and gait normal (see above). speech fluent without dysarthria or aphasia; follows commands with good comprehension.  Psych:  Oriented X3, memory intact for recent and remote, normally interactive, good eye contact, not anxious appearing, not depressed appearing, and not agitated.      Impression & Recommendations:  Problem # 1:  PREOPERATIVE EXAMINATION (ICD-V72.84) This patient has been evaluated and it is felt that the surgical risk is low and outweighed by the potential benefit of the surgery. Therefore, medically clear to proceed when scheduling allows.   Problem # 2:  HYPERTENSION (ICD-401.9)  Her updated medication list for this problem includes:    Amlodipine Besylate 5 Mg Tabs (Amlodipine besylate) .Marland Kitchen... Take 1 by mouth qd    Losartan Potassium 50 Mg Tabs (Losartan potassium) .Marland Kitchen... 1 by mouth once daily    Lasix 20 Mg Tabs (Furosemide) .Marland Kitchen... Take one by mouth once daily  Orders: EKG w/ Interpretation (93000)  BP today: 130/82 Prior BP: 120/82 (06/09/2010)  Labs Reviewed: K+: 4.4 (05/10/2010) Creat: : 0.8 (05/10/2010)   Chol: 206 (10/11/2009)   HDL: 53.10 (10/11/2009)   LDL: 25 (05/26/2009)   TG: 124.0 (10/11/2009)  prev 04/2010 changed ARB to generic losartan (intol of ACEI and HCTZ now improved - cont same  Problem # 3:  DIABETES (ICD-250.00) diet controlled at this point in time The following medications were removed from the medication list:    Metformin Hcl 1000 Mg Tabs (Metformin hcl) .Marland Kitchen... Take 1 two times a day Her updated medication list for this problem includes:    Losartan Potassium 50 Mg Tabs (Losartan potassium) .Marland Kitchen... 1 by mouth once  daily  Labs Reviewed: Creat: 0.8 (05/10/2010)    Reviewed HgBA1c results: 6.1 (05/10/2010)  6.1 (10/11/2009)  Problem # 4:  DEPRESSION (ICD-311)  Her updated medication list for this problem includes:    Pristiq 50 Mg Xr24h-tab (Desvenlafaxine succinate) .Marland Kitchen... 1 by mouth once daily    Celexa 40 Mg Tabs (Citalopram hydrobromide) .Marland Kitchen... Take one by mouth at bedtime    Trazodone Hcl 100 Mg Tabs (Trazodone hcl) .Marland Kitchen... Take one by mouth at bedtime    Xanax 0.5 Mg Tabs (Alprazolam) .Marland Kitchen... As needed  05/10/10 tried to change pristiq to generic SNRI for  coverage as requested - intol of med d/t SE so changed back - doing well overall symptoms improved since quitting work at [employer] to help elderly mom and family  Problem # 5:  DYSLIPIDEMIA (ICD-272.4)  Her updated medication list for this problem includes:    Lovaza 1 Gm Caps (Omega-3-acid ethyl esters) .Marland Kitchen... Take one by mouth two times a day  Labs Reviewed: SGOT: 13 (05/26/2009)   SGPT: 11 (05/26/2009)   HDL:53.10 (10/11/2009), 51 (05/26/2009)  LDL:118 (05/26/2009), 25 (65/78/4696)  Chol:206 (10/11/2009), 194 (05/26/2009)  Trig:124.0 (10/11/2009), 127 (05/26/2009)  Problem # 6:  GERD (ICD-530.81)  Her updated medication list for this problem includes:    Nexium 40 Mg Cpdr (Esomeprazole magnesium) .Marland Kitchen... Take one by mouth at bedtime  Labs Reviewed: Hgb: 13.6 (10/11/2009)   Hct: 40.6 (10/11/2009)  Complete Medication List: 1)  Amlodipine Besylate 5 Mg Tabs (Amlodipine besylate) .... Take 1 by mouth qd 2)  Losartan Potassium 50 Mg Tabs (Losartan potassium) .Marland Kitchen.. 1 by mouth once daily 3)  Lovaza 1 Gm Caps (Omega-3-acid ethyl esters) .... Take one by mouth two times a day 4)  Pristiq 50 Mg Xr24h-tab (Desvenlafaxine succinate) .Marland Kitchen.. 1 by mouth once daily 5)  Celexa 40 Mg Tabs (Citalopram hydrobromide) .... Take one by mouth at bedtime 6)  Nexium 40 Mg Cpdr (Esomeprazole magnesium) .... Take one by mouth at bedtime 7)  Cyclobenzaprine Hcl 10 Mg  Tabs (Cyclobenzaprine hcl) .... Take one by mouth once daily 8)  Trazodone Hcl 100 Mg Tabs (Trazodone hcl) .... Take one by mouth at bedtime 9)  Requip 4 Mg Tabs (Ropinirole hcl) .Marland Kitchen.. 1 by mouth at bedtime 10)  Lasix 20 Mg Tabs (Furosemide) .... Take one by mouth once daily 11)  Potassium Gluconate 595 Mg Tabs (Potassium gluconate) .... Tale 1 by mouth qd 12)  Fentanyl 100 Mcg/hr Pt72 (Fentanyl) .... Change q 3 days 13)  Xanax 0.5 Mg Tabs (Alprazolam) .... As needed 14)  Diclofenac Sodium 75 Mg Tbec (Diclofenac sodium) .... Take 1 po qd 15)  Allegra 180 Mg Tabs (Fexofenadine hcl) .... Take 1 by mouth once daily as needed 16)  Promethazine Hcl 25 Mg Tabs (Promethazine hcl) .... Take one by mouth at bedtime 17)  Triamcinolone Acetonide 0.1 % Crea (Triamcinolone acetonide) .... Appy as directed 18)  Vagifem 10 Mcg Tabs (Estradiol) .... Take 1 at bedtime mon, wed and fri  Patient Instructions: 1)  you have been evaluated today and it is felt that your surgical risk is low and outweighed by the potential benefit of the surgery. Therefore, you are medically clear to proceed when scheduling allows.  2)  refills on nexium as discussed 3)  Please keep follow-up appointment as previously scheduled, sooner if problems.  Prescriptions: NEXIUM 40 MG CPDR (ESOMEPRAZOLE MAGNESIUM) take one by mouth at bedtime  #30 x 5   Entered by:   Orlan Leavens RMA   Authorized by:   Newt Lukes MD   Signed by:   Orlan Leavens RMA on 07/14/2010   Method used:   [facility]  [address] [id number]       [address]       [place], [place]  [address]       Ph: [phone] or [phone]       Fax: [phone]   RxID:   [id number]

## 2010-12-21 NOTE — Progress Notes (Signed)
Summary: NCS results + hand surg refer  Phone Note Outgoing Call   Summary of Call: pleae let pt know we got her NCS results from neuro - test does confirm right side carpal tunnel syndrome - will refer to hand surg for further eval and tx (pt can expect info from [facility] once arranged) - no other tx rec, cont wearing wrist splint at bedtime + as needed  Initial call taken by: Newt Lukes MD,  December 19, 2009 8:38 AM  Follow-up for Phone Call        pt informed. will expect call from [facility] Follow-up by: Margaret Pyle, CMA,  December 19, 2009 8:59 AM

## 2010-12-21 NOTE — Miscellaneous (Signed)
Summary: Living Will  Living Will   Imported By: Lanelle Bal 07/21/2010 13:39:10  _____________________________________________________________________  External Attachment:    Type:   Image     Comment:   External Document

## 2010-12-21 NOTE — Assessment & Plan Note (Signed)
Summary: change depresssion meds/cd   Vital Signs:  Patient profile:   55 year old female Height:      66 inches (167.64 cm) Weight:      236.0 pounds (107.27 kg) O2 Sat:      97 % on Room air Temp:     98.9 degrees F (37.17 degrees C) oral Pulse rate:   73 / minute BP sitting:   120 / 82  (left arm) Cuff size:   large  Vitals Entered By: Orlan Leavens (June 09, 2010 1:37 PM)  O2 Flow:  Room air CC: Want to go back on Pristiq. Pt states since she change to effexor she been sleeping & sweating alot Is Patient Diabetic? No Pain Assessment Patient in pain? no        Primary Care Provider:  Newt Lukes MD  CC:  Want to go back on Pristiq. Pt states since she change to effexor she been sleeping & sweating alot.  History of Present Illness: here for f/u -  1) depression - prev doing well with pristique but tried change to generic effexor for better insurance coverage - caused sedation and sweating - wants to change back - but overall feeling better and has lost weight on current tx - no tearfulness or sadness  2) HTN - prev off meds due to noncoverage buy insurance plan - no CP, no SOB, no edema - reports compliance with ongoing medical treatment and no changes in medication dose or frequency. denies adverse side effects related to current therapy.   3) RLS - has been on 4mg  for symptoms - not controlled on 2mg  dose -   4) DM2 - not taking full dose of rx'd med - reports cbg better with weight loss - ?still need to take any meds  Current Medications (verified): 1)  Metformin Hcl 1000 Mg Tabs (Metformin Hcl) .... Take 1 Two Times A Day 2)  Amlodipine Besylate 5 Mg Tabs (Amlodipine Besylate) .... Take 1 By Mouth Qd 3)  Losartan Potassium 50 Mg Tabs (Losartan Potassium) .Marland Kitchen.. 1 By Mouth Once Daily 4)  Lovaza 1 Gm Caps (Omega-3-Acid Ethyl Esters) .... Take One By Mouth Two Times A Day 5)  Venlafaxine Hcl 75 Mg Xr24h-Cap (Venlafaxine Hcl) .Marland Kitchen.. 1 By Mouth Once Daily 6)  Celexa  40 Mg Tabs (Citalopram Hydrobromide) .... Take One By Mouth At Bedtime 7)  Nexium 40 Mg Cpdr (Esomeprazole Magnesium) .... Take One By Mouth At Bedtime 8)  Cyclobenzaprine Hcl 10 Mg Tabs (Cyclobenzaprine Hcl) .... Take One By Mouth Once Daily 9)  Trazodone Hcl 100 Mg Tabs (Trazodone Hcl) .... Take One By Mouth At Bedtime 10)  Requip 4 Mg Tabs (Ropinirole Hcl) .Marland Kitchen.. 1 By Mouth At Bedtime 11)  Lasix 20 Mg Tabs (Furosemide) .... Take One By Mouth Once Daily 12)  Potassium Gluconate 595 Mg Tabs (Potassium Gluconate) .... Tale 1 By Mouth Qd 13)  Fentanyl 100 Mcg/hr Pt72 (Fentanyl) .... Change Q 3 Days 14)  Xanax 0.5 Mg Tabs (Alprazolam) .... As Needed 15)  Diclofenac Sodium 75 Mg Tbec (Diclofenac Sodium) .... Take 1 Po Qd 16)  Allegra 180 Mg Tabs (Fexofenadine Hcl) .... Take 1 By Mouth Once Daily As Needed 17)  Promethazine Hcl 25 Mg Tabs (Promethazine Hcl) .... Take One By Mouth At Bedtime 18)  Triamcinolone Acetonide 0.1 % Crea (Triamcinolone Acetonide) .... Appy As Directed  Allergies (verified): 1)  ! Macrobid 2)  ! Darvocet 3)  ! Flagyl 4)  ! Lopid 5)  ! *  Altace 6)  ! * Benicar Hct 7)  ! Parafon Forte 8)  ! Neurontin 9)  ! Sulfa 10)  ! * Elmiron 11)  ! * Ramipril  Past History:  Past Medical History: Anxiety Disorder Depression Diabetes type 2 Hypertension Dyslipidemia Fibromyalgia Restless leg syndrome Interstitial cystitis  Chronic fatigue syndrome Irritable Bowel Syndrome Chronic abdominal pain Allergic rhinitis Diverticulitis, hx of GERD thyroid nodules/goiter   osteoarthritis, knee   physican roster - uroLogan Bores ENT-Wolicki gyn-[facility] NSurg-[facility] GI-[facility] ortho - [place], then [place], none now  Review of Systems  The patient denies weight loss, chest pain, peripheral edema, and abdominal pain.    Physical Exam  General:  overweight-appearing.  alert, well-developed, well-nourished, and cooperative to examination.   Lungs:  normal respiratory  effort, no intercostal retractions or use of accessory muscles; normal breath sounds bilaterally - no crackles and no wheezes.    Heart:  normal rate, regular rhythm, no murmur, and no rub. BLE without edema.  Psych:  Oriented X3, memory intact for recent and remote, normally interactive, good eye contact, not anxious appearing, not depressed appearing, and not agitated.      Impression & Recommendations:  Problem # 1:  DEPRESSION (ICD-311)  Her updated medication list for this problem includes:    Pristiq 50 Mg Xr24h-tab (Desvenlafaxine succinate) .Marland Kitchen... 1 by mouth once daily    Celexa 40 Mg Tabs (Citalopram hydrobromide) .Marland Kitchen... Take one by mouth at bedtime    Trazodone Hcl 100 Mg Tabs (Trazodone hcl) .Marland Kitchen... Take one by mouth at bedtime    Xanax 0.5 Mg Tabs (Alprazolam) .Marland Kitchen... As needed  05/10/10 tried to change pristiq to generic SNRI for coverage as requested - intol of med d/t SE - will change back - may need prior auth - office will work on same pnr  overall symptoms improved since quitting work at [employer] to help elderly mom and family  Orders: Administrator, arts [phone])  Problem # 2:  HYPERTENSION (ICD-401.9) Assessment: Improved  Her updated medication list for this problem includes:    Amlodipine Besylate 5 Mg Tabs (Amlodipine besylate) .Marland Kitchen... Take 1 by mouth qd    Losartan Potassium 50 Mg Tabs (Losartan potassium) .Marland Kitchen... 1 by mouth once daily    Lasix 20 Mg Tabs (Furosemide) .Marland Kitchen... Take one by mouth once daily  BP today: 120/82 Prior BP: 142/90 (05/10/2010)  Labs Reviewed: K+: 4.4 (05/10/2010) Creat: : 0.8 (05/10/2010)   Chol: 206 (10/11/2009)   HDL: 53.10 (10/11/2009)   LDL: 25 (05/26/2009)   TG: 124.0 (10/11/2009)  prev 04/2010 changed ARB to generic losartan (intol of ACEI and HCTZ now improved - cont same  Complete Medication List: 1)  Metformin Hcl 1000 Mg Tabs (Metformin hcl) .... Take 1 two times a day 2)  Amlodipine Besylate 5 Mg Tabs (Amlodipine  besylate) .... Take 1 by mouth qd 3)  Losartan Potassium 50 Mg Tabs (Losartan potassium) .Marland Kitchen.. 1 by mouth once daily 4)  Lovaza 1 Gm Caps (Omega-3-acid ethyl esters) .... Take one by mouth two times a day 5)  Pristiq 50 Mg Xr24h-tab (Desvenlafaxine succinate) .Marland Kitchen.. 1 by mouth once daily 6)  Celexa 40 Mg Tabs (Citalopram hydrobromide) .... Take one by mouth at bedtime 7)  Nexium 40 Mg Cpdr (Esomeprazole magnesium) .... Take one by mouth at bedtime 8)  Cyclobenzaprine Hcl 10 Mg Tabs (Cyclobenzaprine hcl) .... Take one by mouth once daily 9)  Trazodone Hcl 100 Mg Tabs (Trazodone hcl) .... Take one by mouth at bedtime 10)  Requip 4 Mg Tabs (Ropinirole hcl) .Marland Kitchen.. 1 by mouth at bedtime 11)  Lasix 20 Mg Tabs (Furosemide) .... Take one by mouth once daily 12)  Potassium Gluconate 595 Mg Tabs (Potassium gluconate) .... Tale 1 by mouth qd 13)  Fentanyl 100 Mcg/hr Pt72 (Fentanyl) .... Change q 3 days 14)  Xanax 0.5 Mg Tabs (Alprazolam) .... As needed 15)  Diclofenac Sodium 75 Mg Tbec (Diclofenac sodium) .... Take 1 po qd 16)  Allegra 180 Mg Tabs (Fexofenadine hcl) .... Take 1 by mouth once daily as needed 17)  Promethazine Hcl 25 Mg Tabs (Promethazine hcl) .... Take one by mouth at bedtime 18)  Triamcinolone Acetonide 0.1 % Crea (Triamcinolone acetonide) .... Appy as directed  Patient Instructions: 1)  it was good to see you today. 2)  stop venlafaxine and change back to pristique- your prescription has been electronically submitted to your pharmacy. Please take as directed. Contact our office if you believe you're having problems with the medication(s). will work on any prior auth with [employer] as needed  3)  Please keep previously scheduled follow-up appointment in 6 months, sooner if problems.  Prescriptions: PRISTIQ 50 MG XR24H-TAB (DESVENLAFAXINE SUCCINATE) 1 by mouth once daily  #30 x 3   Entered and Authorized by:   Newt Lukes MD   Signed by:   Newt Lukes MD on 06/09/2010    Method used:   [facility]  [address] [id number]       [address]       [place], [place]  [address]       Ph: [phone] or [phone]       Fax: [phone]   RxID:   [id number]

## 2010-12-21 NOTE — Progress Notes (Signed)
Summary: Rx refill req  Phone Note Refill Request Message from:  Patient on December 13, 2010 9:52 AM  Refills Requested: Medication #1:  CELEXA 40 MG TABS take one by mouth at bedtime   Dosage confirmed as above?Dosage Confirmed   Supply Requested: 6 months  Method Requested: Electronic Initial call taken by: Margaret Pyle, CMA,  December 13, 2010 9:52 AM    Prescriptions: CELEXA 40 MG TABS (CITALOPRAM HYDROBROMIDE) take one by mouth at bedtime  #30 x 5   Entered by:   Margaret Pyle, CMA   Authorized by:   Newt Lukes MD   Signed by:   Margaret Pyle, CMA on 12/13/2010   Method used:   [facility]  [address] [id number]       [address]       [place], [place]  [address]       Ph: [phone] or [phone]       Fax: [phone]   RxID:   [id number]

## 2010-12-21 NOTE — Progress Notes (Signed)
Summary: results  Phone Note Call from Patient Call back at Home Phone [phone]   Caller: Patient Summary of Call: pt called requesting results of Nerve conduction study on RT arm done at [facility] 11/30/2009. informed pt that we have not receive results as of today. Pt will contact Neuro and have results faxed to MD. Initial call taken by: Margaret Pyle, CMA,  December 12, 2009 10:38 AM

## 2010-12-21 NOTE — Medication Information (Signed)
Summary: Orders / [facility]  Orders / [facility]   Imported By: Lennie Odor 10/09/2010 14:12:26  _____________________________________________________________________  External Attachment:    Type:   Image     Comment:   External Document

## 2010-12-21 NOTE — Assessment & Plan Note (Signed)
Summary: FU--STC   Vital Signs:  Patient profile:   55 year old female Height:      66 inches (167.64 cm) Weight:      231.12 pounds (105.05 kg) O2 Sat:      95 % on Room air Temp:     98.8 degrees F (37.11 degrees C) oral Pulse rate:   93 / minute BP sitting:   142 / 90  (left arm) Cuff size:   large  Vitals Entered By: Orlan Leavens (May 10, 2010 1:33 PM)  O2 Flow:  Room air CC: follow-up visit Is Patient Diabetic? Yes Did you bring your meter with you today? No Pain Assessment Patient in pain? no      Comments Pt states insurance want cover Benicar need something else to replace. have'nt taking in 2 weeks. also need refill on lasix   Primary Care Provider:  Newt Lukes MD  CC:  follow-up visit.  History of Present Illness: here for f/u -  1) depression - doing well with pristique but ? change to generic effexor for [employer] insurance coverage - overall feeling better and has lost weight on current tx - no tearfulness or sadness  2) HTN - off meds due to noncoverage buy insurance plan - no CP, no SOB, no edema - ?if needs to be on meds -  3) RLS - has been on 4mg  for symptoms - not controlled on 2mg  dose -   4) DM2 - not taking full dose of rx'd med - reports cbg better with weight loss - ?still need to take any meds  Clinical Review Panels:  Lipid Management   Cholesterol:  206 (10/11/2009)   LDL (bad choesterol):  118 (05/26/2009)   HDL (good cholesterol):  53.10 (10/11/2009)   Triglycerides:  127 (05/26/2009)  Diabetes Management   HgBA1C:  6.1 (10/11/2009)   Creatinine:  0.8 (10/11/2009)   Last Flu Vaccine:  Historical (08/19/2009)  CBC   WBC:  6.9 (10/11/2009)   RBC:  4.55 (10/11/2009)   Hgb:  13.6 (10/11/2009)   Hct:  40.6 (10/11/2009)   Platelets:  196.0 (10/11/2009)   MCV  89.3 (10/11/2009)   MCHC  33.5 (10/11/2009)   RDW  12.8 (10/11/2009)   PMN:  70.4 (10/11/2009)   Lymphs:  21.8 (10/11/2009)   Monos:  6.5 (10/11/2009)   Eosinophils:   0.9 (10/11/2009)   Basophil:  0.4 (10/11/2009)  Complete Metabolic Panel   Glucose:  121 (10/11/2009)   Sodium:  138 (10/11/2009)   Potassium:  4.1 (10/11/2009)   Chloride:  101 (10/11/2009)   CO2:  28 (10/11/2009)   BUN:  18 (10/11/2009)   Creatinine:  0.8 (10/11/2009)   Albumin:  4.0 (05/26/2009)   Total Protein:  6.4 (05/26/2009)   Calcium:  9.7 (10/11/2009)   Total Bili:  0.3 (05/26/2009)   Alk Phos:  76 (05/26/2009)   SGPT (ALT):  11 (05/26/2009)   SGOT (AST):  13 (05/26/2009)   Current Medications (verified): 1)  Metformin Hcl 1000 Mg Tabs (Metformin Hcl) .... Take 1 Two Times A Day 2)  Amlodipine Besylate 5 Mg Tabs (Amlodipine Besylate) .... Take 1 By Mouth Qd 3)  Benicar 20 Mg Tabs (Olmesartan Medoxomil) .... Take One By Mouth Once Daily 4)  Lovaza 1 Gm Caps (Omega-3-Acid Ethyl Esters) .... Take One By Mouth Two Times A Day 5)  Pristiq 50 Mg Xr24h-Tab (Desvenlafaxine Succinate) .... Take 1 By Mouth Once Daily 6)  Celexa 40 Mg Tabs (Citalopram Hydrobromide) .Marland KitchenMarland KitchenMarland Kitchen  Take One By Mouth At Bedtime 7)  Nexium 40 Mg Cpdr (Esomeprazole Magnesium) .... Take One By Mouth At Bedtime 8)  Cyclobenzaprine Hcl 10 Mg Tabs (Cyclobenzaprine Hcl) .... Take One By Mouth Once Daily 9)  Trazodone Hcl 100 Mg Tabs (Trazodone Hcl) .... Take One By Mouth At Bedtime 10)  Requip 2 Mg Tabs (Ropinirole Hcl) .... Take One By Mouth Once Daily 11)  Lasix 20 Mg Tabs (Furosemide) .... Take One By Mouth Once Daily 12)  Potassium Gluconate 595 Mg Tabs (Potassium Gluconate) .... Tale 1 By Mouth Qd 13)  Fentanyl 100 Mcg/hr Pt72 (Fentanyl) .... Change Q 3 Days 14)  Xanax 0.5 Mg Tabs (Alprazolam) .... As Needed 15)  Diclofenac Sodium 75 Mg Tbec (Diclofenac Sodium) .... Take 1 Po Qd 16)  Allegra 180 Mg Tabs (Fexofenadine Hcl) .... Take 1 By Mouth Once Daily As Needed 17)  Promethazine Hcl 25 Mg Tabs (Promethazine Hcl) .... Take One By Mouth At Bedtime 18)  Triamcinolone Acetonide 0.1 % Crea (Triamcinolone  Acetonide) .... Appy As Directed  Allergies (verified): 1)  ! Macrobid 2)  ! Darvocet 3)  ! Flagyl 4)  ! Lopid 5)  ! * Altace 6)  ! * Benicar Hct 7)  ! Parafon Forte 8)  ! Neurontin 9)  ! Sulfa 10)  ! * Elmiron 11)  ! * Ramipril  Past History:  Past Medical History: Anxiety Disorder Depression Diabetes type 2 Hypertension Dyslipidemia Fibromyalgia Restless leg syndrome Interstitial cystitis Chronic fatigue syndrome Irritable Bowel Syndrome Chronic abdominal pain Allergic rhinitis Diverticulitis, hx of GERD thyroid nodules/goiter   osteoarthritis, knee  physican roster - uroLogan Bores ENT-Wolicki gyn-[facility] NSurg-[facility] GI-[facility] ortho - Supple, then [place], none now  Review of Systems       The patient complains of difficulty walking.  The patient denies chest pain, syncope, dyspnea on exertion, peripheral edema, abdominal pain, and muscle weakness.    Physical Exam  General:  overweight-appearing.  alert, well-developed, well-nourished, and cooperative to examination.   Lungs:  normal respiratory effort, no intercostal retractions or use of accessory muscles; normal breath sounds bilaterally - no crackles and no wheezes.    Heart:  normal rate, regular rhythm, no murmur, and no rub. BLE without edema.    Impression & Recommendations:  Problem # 1:  DEPRESSION (ICD-311) change pristiq to generic SNRI for coverage  as requested - symptoms improved since quitting work at [employer] to help elderly mom and family Her updated medication list for this problem includes:    Venlafaxine Hcl 75 Mg Xr24h-cap (Venlafaxine hcl) .Marland Kitchen... 1 by mouth once daily    Celexa 40 Mg Tabs (Citalopram hydrobromide) .Marland Kitchen... Take one by mouth at bedtime    Trazodone Hcl 100 Mg Tabs (Trazodone hcl) .Marland Kitchen... Take one by mouth at bedtime    Xanax 0.5 Mg Tabs (Alprazolam) .Marland Kitchen... As needed  Problem # 2:  HYPERTENSION (ICD-401.9) Assessment: Deteriorated  resume meds -  change ARB to  generic losartan (intol of ACEI and HCTZ) Her updated medication list for this problem includes:    Amlodipine Besylate 5 Mg Tabs (Amlodipine besylate) .Marland Kitchen... Take 1 by mouth qd    Losartan Potassium 50 Mg Tabs (Losartan potassium) .Marland Kitchen... 1 by mouth once daily    Lasix 20 Mg Tabs (Furosemide) .Marland Kitchen... Take one by mouth once daily  Orders: TLB-BMP (Basic Metabolic Panel-BMET) (80048-METABOL)  BP today: 142/90 Prior BP: 118/72 (01/30/2010)  Labs Reviewed: K+: 4.1 (10/11/2009) Creat: : 0.8 (10/11/2009)   Chol: 206 (10/11/2009)  HDL: 53.10 (10/11/2009)   LDL: 25 (05/26/2009)   TG: 124.0 (10/11/2009)  Problem # 3:  RESTLESS LEG SYNDROME (ICD-333.94) inc requip dose  Problem # 4:  INTERSTITIAL CYSTITIS (ICD-595.1)  mgmt of this and her fibromyalgia by dr. Logan Bores at [facility]- reiterated this office will not provide pain medications or refills for chroinc pain problems/issues -  Problem # 5:  DIABETES (ICD-250.00) inconsistent metformin use  check labs now Her updated medication list for this problem includes:    Metformin Hcl 1000 Mg Tabs (Metformin hcl) .Marland Kitchen... Take 1 two times a day    Losartan Potassium 50 Mg Tabs (Losartan potassium) .Marland Kitchen... 1 by mouth once daily  Orders: TLB-BMP (Basic Metabolic Panel-BMET) (80048-METABOL) TLB-A1C / Hgb A1C (Glycohemoglobin) (83036-A1C)  Labs Reviewed: Creat: 0.8 (10/11/2009)    Reviewed HgBA1c results: 6.1 (10/11/2009)  6.4 (05/26/2009)  Complete Medication List: 1)  Metformin Hcl 1000 Mg Tabs (Metformin hcl) .... Take 1 two times a day 2)  Amlodipine Besylate 5 Mg Tabs (Amlodipine besylate) .... Take 1 by mouth qd 3)  Losartan Potassium 50 Mg Tabs (Losartan potassium) .Marland Kitchen.. 1 by mouth once daily 4)  Lovaza 1 Gm Caps (Omega-3-acid ethyl esters) .... Take one by mouth two times a day 5)  Venlafaxine Hcl 75 Mg Xr24h-cap (Venlafaxine hcl) .Marland Kitchen.. 1 by mouth once daily 6)  Celexa 40 Mg Tabs (Citalopram hydrobromide) .... Take one by mouth at bedtime 7)   Nexium 40 Mg Cpdr (Esomeprazole magnesium) .... Take one by mouth at bedtime 8)  Cyclobenzaprine Hcl 10 Mg Tabs (Cyclobenzaprine hcl) .... Take one by mouth once daily 9)  Trazodone Hcl 100 Mg Tabs (Trazodone hcl) .... Take one by mouth at bedtime 10)  Requip 4 Mg Tabs (Ropinirole hcl) .Marland Kitchen.. 1 by mouth at bedtime 11)  Lasix 20 Mg Tabs (Furosemide) .... Take one by mouth once daily 12)  Potassium Gluconate 595 Mg Tabs (Potassium gluconate) .... Tale 1 by mouth qd 13)  Fentanyl 100 Mcg/hr Pt72 (Fentanyl) .... Change q 3 days 14)  Xanax 0.5 Mg Tabs (Alprazolam) .... As needed 15)  Diclofenac Sodium 75 Mg Tbec (Diclofenac sodium) .... Take 1 po qd 16)  Allegra 180 Mg Tabs (Fexofenadine hcl) .... Take 1 by mouth once daily as needed 17)  Promethazine Hcl 25 Mg Tabs (Promethazine hcl) .... Take one by mouth at bedtime 18)  Triamcinolone Acetonide 0.1 % Crea (Triamcinolone acetonide) .... Appy as directed  Patient Instructions: 1)  it was good to see you today. 2)  change benicar to losartan, change pristique to venlafexine and increase reqiup dose as discussed - your prescriptions have been electronically submitted to your pharmacy. Please take as directed. Contact our office if you believe you're having problems with the medication(s).  3)  test(s) ordered today - your results will be posted on the phone tree for review in 48-72 hours from the time of test completion; call [phone] and enter your 9 digit MRN (listed above on this page, just below your name); if any changes need to be made or there are abnormal results, you will be contacted directly.  4)  Please schedule a follow-up appointment in 6 months, sooner if problems.  Prescriptions: AMLODIPINE BESYLATE 5 MG TABS (AMLODIPINE BESYLATE) take 1 by mouth qd  #30 x 6   Entered by:   Orlan Leavens   Authorized by:   Newt Lukes MD   Signed by:   Orlan Leavens on 05/10/2010   Method used:   [facility]  [address] [id number]       [address]       [place], [place]  [address]       Ph: [phone] or [phone]       Fax: [phone]   RxID:   [id number] REQUIP 4 MG TABS (ROPINIROLE HCL) 1 by mouth at bedtime  #30 x 6   Entered and Authorized by:   Newt Lukes MD   Signed by:   Newt Lukes MD on 05/10/2010   Method used:   [facility]        [facility]  [address] [id number]       [address]       [place], [place]  [address]       Ph: [phone] or [phone]       Fax: [phone]   RxID:   [id number] VENLAFAXINE HCL 75 MG XR24H-CAP (VENLAFAXINE HCL) 1 by mouth once daily  #30 x 6   Entered and Authorized by:   Newt Lukes MD   Signed by:   Newt Lukes MD on 05/10/2010   Method used:   [facility]        [facility]  [address] [id number]       [address]       [place], [place]  [address]       Ph: [phone] or [phone]       Fax: [phone]   RxID:   [id number] LOSARTAN POTASSIUM 50 MG TABS (LOSARTAN POTASSIUM) 1 by mouth once daily  #30 x 6   Entered and Authorized by:   Newt Lukes MD   Signed by:   Newt Lukes MD on 05/10/2010   Method used:   [facility]        [facility]  [address] [id number]       [address]       [place], [place]  [address]       Ph: [phone] or [phone]       Fax: [phone]   RxID:   [id number] LASIX 20 MG TABS (FUROSEMIDE) take one by mouth once daily  #30 x 3   Entered by:   Orlan Leavens   Authorized by:   Newt Lukes MD   Signed by:   Orlan Leavens on 05/10/2010   Method used:   [facility]        [facility]  [address] [id number]       [address]       [place], [place]  [address]       Ph: [phone] or [phone]       Fax: [phone]   RxID:   [id number]

## 2010-12-21 NOTE — Assessment & Plan Note (Signed)
Summary: 3-4 MO ROV /NWS  #   Vital Signs:  Patient profile:   55 year old female Height:      66 inches (167.64 cm) Weight:      232.8 pounds (105.82 kg) O2 Sat:      98 % on Room air Temp:     98.7 degrees F (37.06 degrees C) oral Pulse rate:   78 / minute BP sitting:   144 / 80  (left arm) Cuff size:   large  Vitals Entered By: Orlan Leavens (January 10, 2010 11:12 AM)  O2 Flow:  Room air CC: 3 month follow-up/ Also req FMLA forms to be fill-out Is Patient Diabetic? Yes Did you bring your meter with you today? No Pain Assessment Patient in pain? no        Primary Care Provider:  Newt Lukes MD  CC:  3 month follow-up/ Also req FMLA forms to be fill-out.  History of Present Illness: here today with complaint of chest congestion and cough - onset of symptoms was 01/06/10. course has been sudden onset with diarrhea - now progressed into constantly present fatigue and chest congestion problem precipitated by +sick contacts at work symptom characterized as fatigue and aches problem associated with green gray sputum from chest and nares but not associated with CP, SOB, fever or HA symptoms improved by nothing -. symptoms worsened with activity. no recent hx of same symptoms.   Clinical Review Panels:  Diabetes Management   HgBA1C:  6.1 (10/11/2009)   Creatinine:  0.8 (10/11/2009)   Last Flu Vaccine:  Historical (08/19/2009)  CBC   WBC:  6.9 (10/11/2009)   RBC:  4.55 (10/11/2009)   Hgb:  13.6 (10/11/2009)   Hct:  40.6 (10/11/2009)   Platelets:  196.0 (10/11/2009)   MCV  89.3 (10/11/2009)   MCHC  33.5 (10/11/2009)   RDW  12.8 (10/11/2009)   PMN:  70.4 (10/11/2009)   Lymphs:  21.8 (10/11/2009)   Monos:  6.5 (10/11/2009)   Eosinophils:  0.9 (10/11/2009)   Basophil:  0.4 (10/11/2009)  Complete Metabolic Panel   Glucose:  121 (10/11/2009)   Sodium:  138 (10/11/2009)   Potassium:  4.1 (10/11/2009)   Chloride:  101 (10/11/2009)   CO2:  28 (10/11/2009)  BUN:  18 (10/11/2009)   Creatinine:  0.8 (10/11/2009)   Albumin:  4.0 (05/26/2009)   Total Protein:  6.4 (05/26/2009)   Calcium:  9.7 (10/11/2009)   Total Bili:  0.3 (05/26/2009)   Alk Phos:  76 (05/26/2009)   SGPT (ALT):  11 (05/26/2009)   SGOT (AST):  13 (05/26/2009)   Current Medications (verified): 1)  Metformin Hcl 1000 Mg Tabs (Metformin Hcl) .... Take 1 Two Times A Day 2)  Amlodipine Besylate 5 Mg Tabs (Amlodipine Besylate) .... Take 1 By Mouth Qd 3)  Benicar 20 Mg Tabs (Olmesartan Medoxomil) .... Take One By Mouth Once Daily 4)  Lovaza 1 Gm Caps (Omega-3-Acid Ethyl Esters) .... Take One By Mouth Two Times A Day 5)  Pristiq 50 Mg Xr24h-Tab (Desvenlafaxine Succinate) .... Take 1 By Mouth Once Daily 6)  Celexa 40 Mg Tabs (Citalopram Hydrobromide) .... Take One By Mouth At Bedtime 7)  Nexium 40 Mg Cpdr (Esomeprazole Magnesium) .... Take One By Mouth At Bedtime 8)  Cyclobenzaprine Hcl 10 Mg Tabs (Cyclobenzaprine Hcl) .... Take One By Mouth Once Daily 9)  Trazodone Hcl 100 Mg Tabs (Trazodone Hcl) .... Take One By Mouth At Bedtime 10)  Requip 2 Mg Tabs (Ropinirole Hcl) .Marland KitchenMarland KitchenMarland Kitchen  Take One By Mouth Once Daily 11)  Doxycycline Hyclate 100 Mg Cpep (Doxycycline Hyclate) .... Take One By Mouth Once Daily 12)  Lasix 20 Mg Tabs (Furosemide) .... Take One By Mouth Once Daily 13)  Potassium Gluconate 595 Mg Tabs (Potassium Gluconate) .... Tale 1 By Mouth Qd 14)  Fentanyl 100 Mcg/hr Pt72 (Fentanyl) .... Change Q 3 Days 15)  Xanax 0.5 Mg Tabs (Alprazolam) .... As Needed 16)  Diclofenac Sodium 75 Mg Tbec (Diclofenac Sodium) .... Take 1 Po Qd 17)  Allegra 180 Mg Tabs (Fexofenadine Hcl) .... Take 1 By Mouth Once Daily As Needed 18)  Promethazine Hcl 25 Mg Tabs (Promethazine Hcl) .... Take One By Mouth At Bedtime 19)  Triamcinolone Acetonide 0.1 % Crea (Triamcinolone Acetonide) .... Appy As Directed  Allergies (verified): 1)  ! Macrobid 2)  ! Darvocet 3)  ! Flagyl 4)  ! Lopid 5)  ! * Altace 6)  ! *  Benicar Hct 7)  ! Parafon Forte 8)  ! Neurontin 9)  ! Sulfa 10)  ! * Elmiron 11)  ! * Ramipril  Past History:  Past medical, surgical, family and social histories (including risk factors) reviewed, and no changes noted (except as noted below).  Past Medical History: Reviewed history from 11/16/2009 and no changes required. Anxiety Disorder Depression Diabetes type 2 Hypertension Dyslipidemia Fibromyalgia Restless leg syndrome Interstitial cystitis Chronic fatigue syndrome Irritable Bowel Syndrome Chronic abdominal pain Allergic rhinitis Diverticulitis, hx of GERD thyroid nodules/goiter  physican rooster - uroLogan Bores ENT-Wolicki gyn-[facility] NSurg-Kritzer GI-[facility] ortho - Supple, then [facility], none now  Past Surgical History: Reviewed history from 10/11/2009 and no changes required. Ventral hernia repair with MESH...Dr. Jamey Ripa... Appendectomy (0454) Hysterectomy (1992) Breast biopsy (1978) Tonsillectomy (1970) Laparotomy-exploratory, right ovary 1982 surgery on her nose & sinus (12/29/02) Rectocele reapir (07/20/08) Neck surgery, cervical fusion (01/04/09) bladder hydrodistension 11/2008,06/2007,10/2006,05/2005,09/2004,01/2004,04/2003,08/2002 tumor removed from scalp (01/1992 and 1998)  Family History: Reviewed history from 10/11/2009 and no changes required. Family History of Breast Cancer:grandmother No FH of Colon Cancer: Family History of Diabetes: father Family History of Arthritis (other relative) Family History Hypertension (parent) Migraines (parent)  Social History: Reviewed history from 10/11/2009 and no changes required. Occupation: disabled, works part time at [employer] Divorced lives alone - has one daughter and one son. Patient has never smoked.  Alcohol Use - yes Illicit Drug Use - no  Review of Systems       The patient complains of anorexia, hoarseness, and prolonged cough.  The patient denies fever, chest pain, syncope, dyspnea  on exertion, headaches, hemoptysis, and abdominal pain.         also see HPI above. I have reviewed all other systems and they were negative.   Physical Exam  General:  overweight-appearing.  alert, well-developed, well-nourished, and cooperative to examination.  mildly ill, coughing Eyes:  vision grossly intact; pupils equal, round and reactive to light.  conjunctiva and lids normal.    Ears:  normal pinnae bilaterally, without erythema, swelling, or tenderness to palpation. TMs clear, without effusion, or cerumen impaction. Hearing grossly normal bilaterally  Mouth:  teeth and gums in good repair; mucous membranes moist, without lesions or ulcers. oropharynx clear without exudate, mild erythema. +green drainage in OP from sinuses Lungs:  normal respiratory effort, no intercostal retractions or use of accessory muscles; normal breath sounds bilaterally - no crackles and no wheezes.    Heart:  normal rate, regular rhythm, no murmur, and no rub. BLE without edema.  Impression & Recommendations:  Problem # 1:  ACUTE BRONCHITIS (ICD-466.0)  The following medications were removed from the medication list:    Doxycycline Hyclate 100 Mg Cpep (Doxycycline hyclate) .Marland Kitchen... Take one by mouth once daily Her updated medication list for this problem includes:    Levaquin 500 Mg Tabs (Levofloxacin) .Marland Kitchen... 1 by mouth once daily x 7days  Take antibiotics and other medications as directed. Encouraged to push clear liquids, get enough rest, and take acetaminophen as needed. To be seen in 5-7 days if no improvement, sooner if worse.  FMLA form completed today at [facility] and returned to pt -copy for chart made Time spent with patient 40 minutes, more than 50% of this time was spent completeing FMLA form and discussing symptoms control of her chest and sinus inflammation/infection  Orders: Prescription Created Electronically [phone])  Problem # 2:  SINUSITIS (ICD-473.9)  The following medications were removed  from the medication list:    Doxycycline Hyclate 100 Mg Cpep (Doxycycline hyclate) .Marland Kitchen... Take one by mouth once daily Her updated medication list for this problem includes:    Levaquin 500 Mg Tabs (Levofloxacin) .Marland Kitchen... 1 by mouth once daily x 7days  Take antibiotics for full duration. Discussed treatment options including indications for coronal CT scan of sinuses and ENT referral.   Complete Medication List: 1)  Metformin Hcl 1000 Mg Tabs (Metformin hcl) .... Take 1 two times a day 2)  Amlodipine Besylate 5 Mg Tabs (Amlodipine besylate) .... Take 1 by mouth qd 3)  Benicar 20 Mg Tabs (Olmesartan medoxomil) .... Take one by mouth once daily 4)  Lovaza 1 Gm Caps (Omega-3-acid ethyl esters) .... Take one by mouth two times a day 5)  Pristiq 50 Mg Xr24h-tab (Desvenlafaxine succinate) .... Take 1 by mouth once daily 6)  Celexa 40 Mg Tabs (Citalopram hydrobromide) .... Take one by mouth at bedtime 7)  Nexium 40 Mg Cpdr (Esomeprazole magnesium) .... Take one by mouth at bedtime 8)  Cyclobenzaprine Hcl 10 Mg Tabs (Cyclobenzaprine hcl) .... Take one by mouth once daily 9)  Trazodone Hcl 100 Mg Tabs (Trazodone hcl) .... Take one by mouth at bedtime 10)  Requip 2 Mg Tabs (Ropinirole hcl) .... Take one by mouth once daily 11)  Lasix 20 Mg Tabs (Furosemide) .... Take one by mouth once daily 12)  Potassium Gluconate 595 Mg Tabs (Potassium gluconate) .... Tale 1 by mouth qd 13)  Fentanyl 100 Mcg/hr Pt72 (Fentanyl) .... Change q 3 days 14)  Xanax 0.5 Mg Tabs (Alprazolam) .... As needed 15)  Diclofenac Sodium 75 Mg Tbec (Diclofenac sodium) .... Take 1 po qd 16)  Allegra 180 Mg Tabs (Fexofenadine hcl) .... Take 1 by mouth once daily as needed 17)  Promethazine Hcl 25 Mg Tabs (Promethazine hcl) .... Take one by mouth at bedtime 18)  Triamcinolone Acetonide 0.1 % Crea (Triamcinolone acetonide) .... Appy as directed 19)  Levaquin 500 Mg Tabs (Levofloxacin) .Marland Kitchen.. 1 by mouth once daily x 7days  Patient  Instructions: 1)  it was good to see you today.  2)  antibiotics - Levaquin for your chest and sinus symptoms - your prescription has been electronically submitted to your pharmacy. Please take as directed. Contact our office if you believe you're having problems with the medication(s).  3)  FMLA form completed as per our discussion and returned to you today 4)  Acute bronchitis - take over the counter cough medications like Mucinex or Robitussin - also Ibuprofen for fever and aches. call if no  improvement in  5-7 days, sooner if increasing cough, fever, or new symptoms( shortness of breath, chest pain). 5)  refills on other 2 medications as discussed - requip and benicar Prescriptions: REQUIP 2 MG TABS (ROPINIROLE HCL) take one by mouth once daily  #30 x 11   Entered and Authorized by:   Newt Lukes MD   Signed by:   Newt Lukes MD on 01/10/2010   Method used:   [facility]  [address] [id number]       [address]       [place], [place]  [address]       Ph: [phone] or [phone]       Fax: [phone]   RxID:   [id number] BENICAR 20 MG TABS (OLMESARTAN MEDOXOMIL) take one by mouth once daily  #30 x 11   Entered and Authorized by:   Newt Lukes MD   Signed by:   Newt Lukes MD on 01/10/2010   Method used:   [facility]        [facility]  [address] [id number]       [address]       [place], [place]  [address]       Ph: [phone] or [phone]       Fax: [phone]   RxID:   [id number] LEVAQUIN 500 MG TABS (LEVOFLOXACIN) 1 by mouth once daily x 7days  #7 x 0   Entered and Authorized by:   Newt Lukes MD   Signed by:   Newt Lukes MD on 01/10/2010   Method used:   [facility]        [facility]  [address] [id number]       [address]       [place], [place]  [address]       Ph: [phone] or [phone]       Fax:  [phone]   RxID:   [id number]

## 2010-12-26 ENCOUNTER — Encounter: Payer: Self-pay | Admitting: Internal Medicine

## 2010-12-26 ENCOUNTER — Other Ambulatory Visit: Payer: MEDICARE

## 2010-12-26 ENCOUNTER — Other Ambulatory Visit: Payer: Self-pay | Admitting: Internal Medicine

## 2010-12-26 ENCOUNTER — Ambulatory Visit (INDEPENDENT_AMBULATORY_CARE_PROVIDER_SITE_OTHER): Payer: MEDICARE | Admitting: Internal Medicine

## 2010-12-26 DIAGNOSIS — E119 Type 2 diabetes mellitus without complications: Secondary | ICD-10-CM

## 2010-12-26 DIAGNOSIS — F329 Major depressive disorder, single episode, unspecified: Secondary | ICD-10-CM

## 2010-12-26 DIAGNOSIS — I10 Essential (primary) hypertension: Secondary | ICD-10-CM

## 2010-12-26 DIAGNOSIS — E785 Hyperlipidemia, unspecified: Secondary | ICD-10-CM

## 2010-12-26 LAB — HEMOGLOBIN A1C: Hgb A1c MFr Bld: 6.7 % — ABNORMAL HIGH (ref 4.6–6.5)

## 2010-12-26 LAB — LIPID PANEL: VLDL: 33 mg/dL (ref 0.0–40.0)

## 2011-01-04 NOTE — Assessment & Plan Note (Signed)
Summary: 6 MONTH FOLLOWUP//PER PT--STC   Vital Signs:  Patient profile:   55 year old female Height:      66 inches (167.64 cm) Weight:      234.8 pounds (106.73 kg) O2 Sat:      98 % on Room air Temp:     99.5 degrees F (37.50 degrees C) oral Pulse rate:   98 / minute BP sitting:   130 / 82  (left arm) Cuff size:   large  Vitals Entered By: Orlan Leavens RMA (December 26, 2010 4:03 PM)  O2 Flow:  Room air CC: 6 month follow-up Is Patient Diabetic? Yes Did you bring your meter with you today? Yes Pain Assessment Patient in pain? no      Comments Pt requesting rx for tramadol   Primary Care Provider:  Newt Lukes MD  CC:  6 month follow-up.  History of Present Illness: here for f/u DM2, diet controlled  - prev on metformin but reports cbg improved s/p weight loss -  HTN - no CP, no SOB, no edema - reports compliance with ongoing medical treatment and no changes in medication dose or frequency. denies adverse side effects related to current therapy.   dyslipidemia - controls with weight loss and lovaza - denies rx for statin  s/p B TKA with dr. Charlann Boxer due to adv OA and chronic knee pain/swelling -07/31/10 left, oct 2011 for right hosp and rehab course reviewed - home since 09/24/10 and HHPT - improving slow requests tramadol to use with narc+meloxicam due to pain with walking  depression - prev doing well with pristique but tried change to generic effexor for better insurance coverage - caused sedation and sweating - changed back - now overall feeling better and has lost weight on current tx - no tearfulness or sadness - uses with celexa and trazodone - following with psyc  RLS - has been on requip 4mg  for symptoms - not controlled on 2mg  dose - reports compliance with ongoing medical treatment and no changes in medication dose or frequency. denies adverse side effects related to current therapy.    Preventive Screening-Counseling & Management  Alcohol-Tobacco  Alcohol drinks/day: <1     Alcohol type: beer     Alcohol Counseling: not indicated; use of alcohol is not excessive or problematic     Smoking Status: never     Tobacco Counseling: not indicated; no tobacco use  Clinical Review Panels:  Lipid Management   Cholesterol:  206 (10/11/2009)   LDL (bad choesterol):  118 (05/26/2009)   HDL (good cholesterol):  53.10 (10/11/2009)   Triglycerides:  127 (05/26/2009)  Diabetes Management   HgBA1C:  6.1 (05/10/2010)   Creatinine:  0.8 (05/10/2010)   Last Flu Vaccine:  Historical (08/19/2009)  CBC   WBC:  6.9 (10/11/2009)   RBC:  4.55 (10/11/2009)   Hgb:  13.6 (10/11/2009)   Hct:  40.6 (10/11/2009)   Platelets:  196.0 (10/11/2009)   MCV  89.3 (10/11/2009)   MCHC  33.5 (10/11/2009)   RDW  12.8 (10/11/2009)   PMN:  70.4 (10/11/2009)   Lymphs:  21.8 (10/11/2009)   Monos:  6.5 (10/11/2009)   Eosinophils:  0.9 (10/11/2009)   Basophil:  0.4 (10/11/2009)  Complete Metabolic Panel   Glucose:  138 (05/10/2010)   Sodium:  141 (05/10/2010)   Potassium:  4.4 (05/10/2010)   Chloride:  103 (05/10/2010)   CO2:  31 (05/10/2010)   BUN:  24 (05/10/2010)   Creatinine:  0.8 (05/10/2010)  Albumin:  4.0 (05/26/2009)   Total Protein:  6.4 (05/26/2009)   Calcium:  9.7 (05/10/2010)   Total Bili:  0.3 (05/26/2009)   Alk Phos:  76 (05/26/2009)   SGPT (ALT):  11 (05/26/2009)   SGOT (AST):  13 (05/26/2009)   Current Medications (verified): 1)  Amlodipine Besylate 5 Mg Tabs (Amlodipine Besylate) .... Take 1 By Mouth Qd 2)  Losartan Potassium 50 Mg Tabs (Losartan Potassium) .Marland Kitchen.. 1 By Mouth Once Daily 3)  Lovaza 1 Gm Caps (Omega-3-Acid Ethyl Esters) .... Take One By Mouth Two Times A Day 4)  Pristiq 50 Mg Xr24h-Tab (Desvenlafaxine Succinate) .Marland Kitchen.. 1 By Mouth Once Daily 5)  Celexa 40 Mg Tabs (Citalopram Hydrobromide) .... Take One By Mouth At Bedtime 6)  Nexium 40 Mg Cpdr (Esomeprazole Magnesium) .... Take One By Mouth At Bedtime 7)  Trazodone Hcl 100  Mg Tabs (Trazodone Hcl) .... Take One By Mouth At Bedtime 8)  Requip 4 Mg Tabs (Ropinirole Hcl) .Marland Kitchen.. 1 By Mouth At Bedtime 9)  Lasix 20 Mg Tabs (Furosemide) .... Take One By Mouth Once Daily 10)  Potassium Gluconate 595 Mg Tabs (Potassium Gluconate) .... Tale 1 By Mouth Qd 11)  Fentanyl 100 Mcg/hr Pt72 (Fentanyl) .... Change Q 2 Days 12)  Alprazolam 1 Mg Tabs (Alprazolam) .... A Tab By Mouth Three Times A Day Prn 13)  Allegra 180 Mg Tabs (Fexofenadine Hcl) .... Take 1 By Mouth Once Daily As Needed 14)  Promethazine Hcl 25 Mg Tabs (Promethazine Hcl) .... Take One By Mouth At Bedtime 15)  Vagifem 10 Mcg Tabs (Estradiol) .... Take 1 At Bedtime Mon, Wed and Fri 16)  Meloxicam 15 Mg Tabs (Meloxicam) .Marland Kitchen.. 1 By Mouth Once Daily As Needed For Pain Symptoms  Allergies (verified): 1)  ! Macrobid 2)  ! Darvocet 3)  ! Flagyl 4)  ! Lopid 5)  ! * Altace 6)  ! * Benicar Hct 7)  ! Parafon Forte 8)  ! Neurontin 9)  ! Sulfa 10)  ! * Elmiron 11)  ! * Ramipril  Past History:  Past Medical History: Anxiety -Depression Diabetes type 2, diet controlled  Hypertension Dyslipidemia Fibromyalgia Restless leg syndrome Interstitial cystitis  Chronic fatigue syndrome Irritable Bowel Syndrome Chronic abdominal pain Allergic rhinitis Diverticulitis, hx  GERD thyroid nodules/goiter   osteoarthritis, knee - s/p B TKR fall 2011    physican roster - uroLogan Bores ENT-Wolicki gyn-[facility]  NSurg-Kritzer GI-[facility] ortho - [facility], then [facility], now [facility] - [facility]aur  Review of Systems  The patient denies anorexia, weight gain, chest pain, peripheral edema, headaches, and severe indigestion/heartburn.    Physical Exam  General:  alert, well-developed, well-nourished, and cooperative to examination.    Lungs:  normal respiratory effort, no intercostal retractions or use of accessory muscles; normal breath sounds bilaterally - no crackles and no wheezes.    Heart:  normal rate, regular rhythm, no  murmur, and no rub. BLE without edema.  Psych:  Oriented X3, memory intact for recent and remote, normally interactive, good eye contact, not anxious appearing, not depressed appearing, and not agitated.      Impression & Recommendations:  Problem # 1:  DIABETES (ICD-250.00)  Her updated medication list for this problem includes:    Losartan Potassium 50 Mg Tabs (Losartan potassium) .Marland Kitchen... 1 by mouth once daily  Orders: TLB-A1C / Hgb A1C (Glycohemoglobin) (83036-A1C)  Diet controlled  Labs Reviewed: Creat: 0.8 (05/10/2010)    Reviewed HgBA1c results: 6.1 (05/10/2010)  6.1 (10/11/2009)  Problem # 2:  DYSLIPIDEMIA (ICD-272.4)  Her updated medication list for this problem includes:    Lovaza 1 Gm Caps (Omega-3-acid ethyl esters) .Marland Kitchen... Take one by mouth two times a day  Orders: TLB-Lipid Panel (80061-LIPID)  Labs Reviewed: SGOT: 13 (05/26/2009)   SGPT: 11 (05/26/2009)   HDL:53.10 (10/11/2009), 51 (05/26/2009)  LDL:118 (05/26/2009), 25 (32/44/0102)  Chol:206 (10/11/2009), 194 (05/26/2009)  Trig:124.0 (10/11/2009), 127 (05/26/2009)  Problem # 3:  HYPERTENSION (ICD-401.9)  Her updated medication list for this problem includes:    Amlodipine Besylate 5 Mg Tabs (Amlodipine besylate) .Marland Kitchen... Take 1 by mouth qd    Losartan Potassium 50 Mg Tabs (Losartan potassium) .Marland Kitchen... 1 by mouth once daily    Lasix 20 Mg Tabs (Furosemide) .Marland Kitchen... Take one by mouth once daily  prev 04/2010 changed ARB to generic losartan (intol of ACEI and HCTZ)  BP today: 130/82 Prior BP: 158/84 (10/17/2010) Prior BP: 130/82 (07/14/2010)  Labs Reviewed: K+: 4.4 (05/10/2010) Creat: : 0.8 (05/10/2010)   Chol: 206 (10/11/2009)   HDL: 53.10 (10/11/2009)   LDL: 25 (05/26/2009)   TG: 124.0 (10/11/2009)  Problem # 4:  DEPRESSION (ICD-311)  Her updated medication list for this problem includes:    Pristiq 50 Mg Xr24h-tab (Desvenlafaxine succinate) .Marland Kitchen... 1 by mouth once daily    Celexa 40 Mg Tabs (Citalopram  hydrobromide) .Marland Kitchen... Take one by mouth at bedtime    Trazodone Hcl 100 Mg Tabs (Trazodone hcl) .Marland Kitchen... Take one by mouth at bedtime    Alprazolam 1 Mg Tabs (Alprazolam) .Marland Kitchen... A tab by mouth three times a day prn  historically hard to control problem, complicated by anxiety, with use of high doses on antidpressants continue to work with psyc on same - pt considering change - will let us know if referral needed  Complete Medication List: 1)  Amlodipine Besylate 5 Mg Tabs (Amlodipine besylate) .... Take 1 by mouth qd 2)  Losartan Potassium 50 Mg Tabs (Losartan potassium) .Marland Kitchen.. 1 by mouth once daily 3)  Lovaza 1 Gm Caps (Omega-3-acid ethyl esters) .... Take one by mouth two times a day 4)  Pristiq 50 Mg Xr24h-tab (Desvenlafaxine succinate) .Marland Kitchen.. 1 by mouth once daily 5)  Celexa 40 Mg Tabs (Citalopram hydrobromide) .... Take one by mouth at bedtime 6)  Nexium 40 Mg Cpdr (Esomeprazole magnesium) .... Take one by mouth at bedtime 7)  Trazodone Hcl 100 Mg Tabs (Trazodone hcl) .... Take one by mouth at bedtime 8)  Requip 4 Mg Tabs (Ropinirole hcl) .Marland Kitchen.. 1 by mouth at bedtime 9)  Lasix 20 Mg Tabs (Furosemide) .... Take one by mouth once daily 10)  Potassium Gluconate 595 Mg Tabs (Potassium gluconate) .... Tale 1 by mouth qd 11)  Fentanyl 100 Mcg/hr Pt72 (Fentanyl) .... Change q 2 days 12)  Alprazolam 1 Mg Tabs (Alprazolam) .... A tab by mouth three times a day prn 13)  Allegra 180 Mg Tabs (Fexofenadine hcl) .... Take 1 by mouth once daily as needed 14)  Promethazine Hcl 25 Mg Tabs (Promethazine hcl) .... Take one by mouth at bedtime 15)  Vagifem 10 Mcg Tabs (Estradiol) .... Take 1 at bedtime mon, wed and fri 16)  Meloxicam 15 Mg Tabs (Meloxicam) .Marland Kitchen.. 1 by mouth once daily as needed for pain symptoms 17)  Tramadol Hcl 50 Mg Tabs (Tramadol hcl) .Marland Kitchen.. 1 by mouth every 6 hours as needed for pain symptoms  Patient Instructions: 1)  it was good to see you today.  2)  test(s) ordered today - your results will be  posted  on the phone tree for review in 48-72 hours from the time of test completion; call [phone] and enter your 9 digit MRN (listed above on this page, just below your name); if any changes need to be made or there are abnormal results, you will be contacted directly. 3)  ok to try tramadol for pain but use with caution if you remain on celexa and pristiqe - talk with your pscychiatrist about other med options - 4)  Please schedule a follow-up appointment in 6 months for blood presure and cholesterol check, call sooner if problems.  Prescriptions: TRAMADOL HCL 50 MG TABS (TRAMADOL HCL) 1 by mouth every 6 hours as needed for pain symptoms  #40 x 1   Entered and Authorized by:   Newt Lukes MD   Signed by:   Newt Lukes MD on 12/26/2010   Method used:   [facility]        [facility]  [address] [id number]       [address]       [place], [place]  [address]       Ph: [phone] or [phone]       Fax: [phone]   RxID:   [id number]    Orders Added: 1)  TLB-A1C / Hgb A1C (Glycohemoglobin) [83036-A1C] 2)  TLB-Lipid Panel [80061-LIPID] 3)  Est. Patient Level IV [87564]

## 2011-01-16 ENCOUNTER — Encounter: Payer: Self-pay | Admitting: Internal Medicine

## 2011-01-30 NOTE — Letter (Signed)
Summary: [facility]   Imported By: Sherian Rein 01/24/2011 10:44:58  _____________________________________________________________________  External Attachment:    Type:   Image     Comment:   External Document

## 2011-02-01 LAB — POCT I-STAT, CHEM 8
Calcium, Ion: 1.15 mmol/L (ref 1.12–1.32)
Creatinine, Ser: 0.8 mg/dL (ref 0.4–1.2)
Glucose, Bld: 162 mg/dL — ABNORMAL HIGH (ref 70–99)
Hemoglobin: 10.9 g/dL — ABNORMAL LOW (ref 12.0–15.0)
Potassium: 3.8 mEq/L (ref 3.5–5.1)
TCO2: 26 mmol/L (ref 0–100)

## 2011-02-01 LAB — BASIC METABOLIC PANEL
BUN: 17 mg/dL (ref 6–23)
BUN: 15 mg/dL (ref 6–23)
CO2: 30 mEq/L (ref 19–32)
CO2: 31 mEq/L (ref 19–32)
CO2: 29 mEq/L (ref 19–32)
CO2: 32 mEq/L (ref 19–32)
Calcium: 8.2 mg/dL — ABNORMAL LOW (ref 8.4–10.5)
Calcium: 8.4 mg/dL (ref 8.4–10.5)
Calcium: 9 mg/dL (ref 8.4–10.5)
Calcium: 8.5 mg/dL (ref 8.4–10.5)
Chloride: 104 mEq/L (ref 96–112)
Chloride: 101 mEq/L (ref 96–112)
Chloride: 103 mEq/L (ref 96–112)
Creatinine, Ser: 0.86 mg/dL (ref 0.4–1.2)
Creatinine, Ser: 0.87 mg/dL (ref 0.4–1.2)
Creatinine, Ser: 0.95 mg/dL (ref 0.4–1.2)
Creatinine, Ser: 0.74 mg/dL (ref 0.4–1.2)
GFR calc Af Amer: >60 mL/min (ref >60)
GFR calc Af Amer: >60 mL/min (ref >60)
GFR calc Af Amer: >60 mL/min (ref >60)
GFR calc non Af Amer: >60 mL/min (ref >60)
GFR calc non Af Amer: >60 mL/min (ref >60)
GFR calc non Af Amer: >60 mL/min (ref >60)
Glucose, Bld: 156 mg/dL — ABNORMAL HIGH (ref 70–99)
Glucose, Bld: 176 mg/dL — ABNORMAL HIGH (ref 70–99)
Glucose, Bld: 94 mg/dL (ref 70–99)
Glucose, Bld: 117 mg/dL — ABNORMAL HIGH (ref 70–99)
Glucose, Bld: 186 mg/dL — ABNORMAL HIGH (ref 70–99)
Potassium: 3.6 mEq/L (ref 3.5–5.1)
Potassium: 3.8 mEq/L (ref 3.5–5.1)
Sodium: 135 mEq/L (ref 135–145)
Sodium: 137 mEq/L (ref 135–145)

## 2011-02-01 LAB — PROTIME-INR
INR: 1.15 (ref 0.00–1.49)
INR: 1.25 (ref 0.00–1.49)
INR: 1.39 (ref 0.00–1.49)
INR: 1.72 — ABNORMAL HIGH (ref 0.00–1.49)
INR: 1.92 — ABNORMAL HIGH (ref 0.00–1.49)
Prothrombin Time: 14.9 seconds (ref 11.6–15.2)
Prothrombin Time: 17.3 seconds — ABNORMAL HIGH (ref 11.6–15.2)
Prothrombin Time: 20.3 seconds — ABNORMAL HIGH (ref 11.6–15.2)
Prothrombin Time: 22.1 s — ABNORMAL HIGH (ref 11.6–15.2)

## 2011-02-01 LAB — GLUCOSE, CAPILLARY
Glucose-Capillary: 122 mg/dL — ABNORMAL HIGH (ref 70–99)
Glucose-Capillary: 123 mg/dL — ABNORMAL HIGH (ref 70–99)
Glucose-Capillary: 142 mg/dL — ABNORMAL HIGH (ref 70–99)
Glucose-Capillary: 145 mg/dL — ABNORMAL HIGH (ref 70–99)
Glucose-Capillary: 146 mg/dL — ABNORMAL HIGH (ref 70–99)
Glucose-Capillary: 149 mg/dL — ABNORMAL HIGH (ref 70–99)
Glucose-Capillary: 155 mg/dL — ABNORMAL HIGH (ref 70–99)
Glucose-Capillary: 156 mg/dL — ABNORMAL HIGH (ref 70–99)
Glucose-Capillary: 158 mg/dL — ABNORMAL HIGH (ref 70–99)
Glucose-Capillary: 120 mg/dL — ABNORMAL HIGH (ref 70–99)
Glucose-Capillary: 140 mg/dL — ABNORMAL HIGH (ref 70–99)
Glucose-Capillary: 167 mg/dL — ABNORMAL HIGH (ref 70–99)
Glucose-Capillary: 191 mg/dL — ABNORMAL HIGH (ref 70–99)
Glucose-Capillary: 198 mg/dL — ABNORMAL HIGH (ref 70–99)

## 2011-02-01 LAB — URINALYSIS, ROUTINE W REFLEX MICROSCOPIC
Bilirubin Urine: NEGATIVE
Glucose, UA: 100 mg/dL — AB
Ketones, ur: NEGATIVE mg/dL
Nitrite: NEGATIVE
Protein, ur: NEGATIVE mg/dL
Specific Gravity, Urine: 1.012 (ref 1.005–1.030)
Specific Gravity, Urine: 1.017 (ref 1.005–1.030)
Urobilinogen, UA: 0.2 mg/dL (ref 0.0–1.0)

## 2011-02-01 LAB — DIFFERENTIAL
Basophils Relative: 1 % (ref 0–1)
Eosinophils Absolute: 0.1 10*3/uL (ref 0.0–0.7)
Eosinophils Relative: 2 % (ref 0–5)
Lymphocytes Relative: 35 % (ref 12–46)
Lymphocytes Relative: 33 % (ref 12–46)
Lymphs Abs: 1 10*3/uL (ref 0.7–4.0)
Monocytes Absolute: 0.2 10*3/uL (ref 0.1–1.0)
Monocytes Absolute: 0.4 10*3/uL (ref 0.1–1.0)
Monocytes Relative: 8 % (ref 3–12)
Monocytes Relative: 9 % (ref 3–12)
Neutro Abs: 2.3 10*3/uL (ref 1.7–7.7)

## 2011-02-01 LAB — CBC
HCT: 33.3 % — ABNORMAL LOW (ref 36.0–46.0)
HCT: 30.5 % — ABNORMAL LOW (ref 36.0–46.0)
HCT: 36.7 % (ref 36.0–46.0)
Hemoglobin: 11.2 g/dL — ABNORMAL LOW (ref 12.0–15.0)
Hemoglobin: 12.6 g/dL (ref 12.0–15.0)
MCH: 28.3 pg (ref 26.0–34.0)
MCH: 28.4 pg (ref 26.0–34.0)
MCH: 28.5 pg (ref 26.0–34.0)
MCH: 29.1 pg (ref 26.0–34.0)
MCHC: 33.3 g/dL (ref 30.0–36.0)
MCHC: 33.5 g/dL (ref 30.0–36.0)
MCHC: 33.6 g/dL (ref 30.0–36.0)
MCHC: 33.3 g/dL (ref 30.0–36.0)
MCHC: 34.2 g/dL (ref 30.0–36.0)
MCV: 84.8 fL (ref 78.0–100.0)
MCV: 84.9 fL (ref 78.0–100.0)
MCV: 86 fL (ref 78.0–100.0)
MCV: 87.2 fL (ref 78.0–100.0)
Platelets: 137 10*3/uL — ABNORMAL LOW (ref 150–400)
Platelets: 150 10*3/uL (ref 150–400)
Platelets: 206 K/uL (ref 150–400)
Platelets: 137 10*3/uL — ABNORMAL LOW (ref 150–400)
RBC: 2.91 MIL/uL — ABNORMAL LOW (ref 3.87–5.11)
RBC: 3.93 MIL/uL (ref 3.87–5.11)
RBC: 3.35 MIL/uL — ABNORMAL LOW (ref 3.87–5.11)
RDW: 13.5 % (ref 11.5–15.5)
RDW: 13.1 % (ref 11.5–15.5)
WBC: 2.9 K/uL — ABNORMAL LOW (ref 4.0–10.5)
WBC: 5.6 10*3/uL (ref 4.0–10.5)

## 2011-02-01 LAB — HEPATIC FUNCTION PANEL
AST: 17 U/L (ref 0–37)
Albumin: 3.7 g/dL (ref 3.5–5.2)
Alkaline Phosphatase: 81 U/L (ref 39–117)
Total Bilirubin: 0.5 mg/dL (ref 0.3–1.2)
Total Protein: 6.8 g/dL (ref 6.0–8.3)

## 2011-02-01 LAB — TYPE AND SCREEN
ABO/RH(D): A POS
ABO/RH(D): A POS

## 2011-02-01 LAB — SURGICAL PCR SCREEN
MRSA, PCR: NEGATIVE
Staphylococcus aureus: NEGATIVE

## 2011-02-01 LAB — APTT
aPTT: 29 s (ref 24–37)
aPTT: 28 seconds (ref 24–37)

## 2011-02-10 ENCOUNTER — Other Ambulatory Visit: Payer: Self-pay | Admitting: Internal Medicine

## 2011-02-16 ENCOUNTER — Other Ambulatory Visit: Payer: Self-pay | Admitting: Obstetrics and Gynecology

## 2011-02-16 DIAGNOSIS — N6310 Unspecified lump in the right breast, unspecified quadrant: Secondary | ICD-10-CM

## 2011-02-19 ENCOUNTER — Encounter: Payer: MEDICARE | Admitting: Genetic Counselor

## 2011-02-20 ENCOUNTER — Ambulatory Visit
Admission: RE | Admit: 2011-02-20 | Discharge: 2011-02-20 | Disposition: A | Discharge status: Home / Self Care | Payer: MEDICARE | Source: Ambulatory Visit | Attending: Obstetrics and Gynecology | Admitting: Obstetrics and Gynecology

## 2011-02-20 ENCOUNTER — Other Ambulatory Visit: Payer: Self-pay | Admitting: Internal Medicine

## 2011-02-20 DIAGNOSIS — N6310 Unspecified lump in the right breast, unspecified quadrant: Secondary | ICD-10-CM

## 2011-02-26 ENCOUNTER — Other Ambulatory Visit: Payer: Self-pay | Admitting: Internal Medicine

## 2011-03-06 ENCOUNTER — Other Ambulatory Visit: Payer: Self-pay | Admitting: Internal Medicine

## 2011-03-06 LAB — BASIC METABOLIC PANEL
CO2: 31 mEq/L (ref 19–32)
Calcium: 9.8 mg/dL (ref 8.4–10.5)
GFR calc Af Amer: >60 mL/min (ref >60)
GFR calc non Af Amer: >60 mL/min (ref >60)
Glucose, Bld: 118 mg/dL — ABNORMAL HIGH (ref 70–99)
Potassium: 4.7 mEq/L (ref 3.5–5.1)
Sodium: 139 mEq/L (ref 135–145)

## 2011-03-06 LAB — GLUCOSE, CAPILLARY
Glucose-Capillary: 127 mg/dL — ABNORMAL HIGH (ref 70–99)
Glucose-Capillary: 169 mg/dL — ABNORMAL HIGH (ref 70–99)
Glucose-Capillary: 195 mg/dL — ABNORMAL HIGH (ref 70–99)

## 2011-03-06 LAB — DIFFERENTIAL
Lymphocytes Relative: 30 % (ref 12–46)
Lymphs Abs: 2.1 10*3/uL (ref 0.7–4.0)
Monocytes Relative: 6 % (ref 3–12)
Neutrophils Relative %: 62 % (ref 43–77)

## 2011-03-06 LAB — TYPE AND SCREEN
ABO/RH(D): A POS
Antibody Screen: NEGATIVE

## 2011-03-06 LAB — CBC
HCT: 38.7 % (ref 36.0–46.0)
Hemoglobin: 13.3 g/dL (ref 12.0–15.0)
RBC: 4.45 MIL/uL (ref 3.87–5.11)
RDW: 14.6 % (ref 11.5–15.5)

## 2011-03-06 LAB — ABO/RH: ABO/RH(D): A POS

## 2011-04-03 NOTE — Op Note (Signed)
[name], [name]              ACCOUNT NO.:  [id number]   MEDICAL RECORD NO.:  [id number]          PATIENT TYPE:  AMB   LOCATION:  [facility]                         FACILITY:  [facility]   PHYSICIAN:  Jamison Neighbor, M.D.  DATE OF BIRTH:  [birth year]   DATE OF PROCEDURE:  07/01/2007  DATE OF DISCHARGE:                               OPERATIVE REPORT   PREOPERATIVE DIAGNOSIS:  Interstitial cystitis with urgency  incontinence.   POSTOPERATIVE DIAGNOSIS:  Interstitial cystitis with urgency  incontinence.   OPERATION/PROCEDURE:  1. Cystoscopy.  2. Urethral calibration.  3. Hydrodistention of the bladder.  4. Marcaine and Pyridium installation.  5. Marcaine and Kenalog injection.  6. Botox injection.   SURGEON:  Jamison Neighbor, M.D.   ANESTHESIA:  General.   COMPLICATIONS:  None.   DRAINS:  None.   BRIEF HISTORY:  This patient is known to have interstitial cystitis.  The patient has terrible problems with urgency incontinence unresponsive  to medication.  She would like to try a Botox injection.  The will be  done simultaneously with the hydrodistention.  She understands the risks  and benefits of the procedure and gave full informed consent.   DESCRIPTION OF PROCEDURE:  After successful induction of general  anesthesia, the patient was placed in the dorsal lithotomy position,  prepped with Betadine and draped in the usual sterile fashion.  Careful  bimanual examination revealed no real prolapse and no change in her  vaginal appearance.  Urethra was calibrated to 32-French with female  urethral sounds with no signs of stenosis or stricture.  The cystoscope  was inserted.  The bladder was carefully inspected.  No tumors or stones  could be seen.  Hydrodistention of the bladder was performed.  The  bladder was distended at a pressure of 100 cm water for 5 minutes.  When  the bladder was drained, glomerulations could be seen throughout the  bladder.  Bladder capacity is moderately  diminished to 750 mL which  compares favorably the average bladder capacity for IC of 575.  Was a  little bit less than normal capacity of 1100 mL.  The patient had Botox  injection with 2 ampules delivered primarily into the submucosal space.  She then had Marcaine and Pyridium left in the bladder.  Marcaine and  Kenalog were injected periurethral.   She will be sent home with Oxy-IR for additional pain coverage.  She is  on Duragesic patches.  She will be given doxycycline for antibiotic  coverage.  She does know that if she cannot urinate, she will have to do  self-catheterization.      Jamison Neighbor, M.D.  Electronically Signed     RJE/MEDQ  D:  07/01/2007  T:  07/02/2007  Job:  [id number]

## 2011-04-03 NOTE — Discharge Summary (Signed)
NAMELESLEYANNE, POLITTE              ACCOUNT NO.:  [id number]   MEDICAL RECORD NO.:  [id number]          PATIENT TYPE:  OIB   LOCATION:  9308                          FACILITY:  [facility]   PHYSICIAN:  Juluis Mire, M.D.   DATE OF BIRTH:  [birth year]   DATE OF ADMISSION:  07/20/2008  DATE OF DISCHARGE:  07/21/2008                               DISCHARGE SUMMARY   ADMITTING DIAGNOSIS:  Rectocele.   DISCHARGE DIAGNOSIS:  Rectocele.   PROCEDURE:  Posterior repair.  For complete history and physical, please  see the dictated note.   COURSE IN THE HOSPITAL:  The patient underwent a posterior repair.  She  did extremely well Postop.  The Pap was discontinued the next day and  her Foley was discontinued.  Once she has been able to void, we thought  discharge her home.  She was tolerating a regular diet.  She had minimal  vaginal bleeding.  She had good urinary output.  Her postop hemoglobin  was 11.4.   In terms of complications, none were encountered during her stay in the  hospital.  The patient was discharged home in stable condition.   DISPOSITION:  The patient is discharged home.  She is to avoid heavy  lifting, vaginal instruments, or driving the car.  She is to watch for  signs of infection, active vaginal bleeding, increasing pain, or  difficulty with bowel movements.  She was discharged home on Tylox as  needed for pain, stool softener for increasing fluid.  The office will  call her tomorrow to arrange followup in 1 week.      Juluis Mire, M.D.  Electronically Signed     JSM/MEDQ  D:  07/21/2008  T:  07/21/2008  Job:  [id number]

## 2011-04-03 NOTE — H&P (Signed)
NAME:  [name], [name]              ACCOUNT NO.:  [id number]   MEDICAL RECORD NO.:  [id number]          PATIENT TYPE:  AMB   LOCATION:  [facility]                           FACILITY:  [facility]   PHYSICIAN:  Juluis Mire, M.D.   DATE OF BIRTH:  July 21, 1956   DATE OF ADMISSION:  07/20/2008  DATE OF DISCHARGE:                              HISTORY & PHYSICAL   HISTORY OF PRESENT ILLNESS:  The patient is a 51-year gravida 2, para 2  abortus one female presents for posterior repair.   The patient had previous laparoscopic-assisted vaginal hysterectomy for  pelvic pain.  She has trouble with worsening rectocele.  The present  time, she is having difficulty with bowel movements.  Stool will be  caught in the rectocele requiring fingers in the vagina for bowel  evacuation.  We tried exercise as well as Premarin vaginal cream without  response.  Now she presents for posterior repair for management of  symptomatic rectocele.  We are going to be using the mesh material.   ALLERGIES:  SHE IS ALLERGIC TO MORPHINE, SULFA, DARVOCET, FLAGYL,  BENICAR, PARAFON FORTE, ALTACE AND MACROBID.   CURRENT MEDICATIONS:  1. Benicar 20 mg daily.  2. Wellbutrin XL 150 mg.  3. Cardiozyme LA 250 mg daily.  4. Celexa 40 mg daily.  5. Cyclobenzaprine 10 mg daily.  6. Doxycycline 100 mg daily.  7. Lovaza 1 gram b.i.d. daily.  8. Fentanyl patch.  9. Lasix 20 mg daily.  10.Hydrocodone as needed.  11.Naproxen 500 mg daily.  12.Nexium 40 mg daily.  13.Phenergan 25 mg at bedtime.  14.Requip 2 mg at bedtime.  15.Trazodone 100 mg at night.   PAST MEDICAL HISTORY:  1. The patient suffers from a variety of functional disorders      including interstitial cystitis, fibromyalgia, restless leg      syndrome, chronic fatigue and chronic pain syndrome.  2. She also has been managed by dietary issues for type 2 diabetes.  3. Has chronic anxiety and depression.  4. Also has sleep apnea.   PAST SURGICAL HISTORY:  1. Previous  tonsillectomy and adenoidectomy.  2. She had previous laparoscopies in the past for evaluation of pelvic      status.  3. She had exploratory laparotomy right ovarian cystectomy.  4. Appendectomy.  5. Removal of Meckel's diverticulum  6. Repair of ventral hernia.  7. Laparoscopic-assisted vaginal hysterectomy.  8. Previous laparoscopy in 1995.  9. Repeat laparoscopy in 1996.   OBSTETRICAL HISTORY:  She has had a vaginal delivery in 1995,  spontaneous abortion in 1985, spontaneous vaginal delivery in 1990.   FAMILY HISTORY:  Breast cancer in grandmother and diabetes in her  father.   SOCIAL HISTORY:  Reveals no tobacco or alcohol use.   REVIEW OF SYSTEMS:  She has multiple complaints, but nothing  specifically is related to planned surgery.   PHYSICAL EXAMINATION:  VITAL SIGNS:  The patient is afebrile, stable  vital signs.  HEENT:  The patient is normocephalic.  Pupils equal and react to light  accommodation.  Extraocular were intact.  Sclerae and conjunctivae  clear.  Oropharynx clear.  NECK:  Without thyromegaly.  BREASTS:  No discrete masses.  LUNGS:  Clear.  CARDIOVASCULAR:  Regular rate without murmurs or gallops.  ABDOMEN:  Benign.  No mass, organomegaly or tenderness.  PELVIC:  Does have a prominent rectocele, anterior support is excellent,  cuff is intact.  Bimanual exam is unremarkable.  No masses appreciated.  RECTOVAGINAL:  Exam confirms a prominent rectocele.  No enterocele  noted.  EXTREMITIES:  Trace edema.  NEUROLOGICAL:  Grossly within normal limits.   IMPRESSION:  Symptomatic rectocele.   PLAN:  The patient will undergo rectocele repair.  We are going to be  using the [place] scientific mesh.  We discussed that FDA has not  completely approved using the mesh vaginally.  Discussed that the  potential mesh complications could include erosion or infection  requiring removal of the mesh material with recurrence of the rectocele.  Obviously, repair is not  100%.  Also with mesh procedures there is a  risk of pudendal nerve injury that could lead to chronic buttocks  discomfort.  Other risks include the risk of infection.  The risk of  vascular injury could lead to hemorrhage requiring transfusion with the  risk of AIDS or hepatitis.  Risk of injury to adjacent organs,  specifically bowel that could require further exploratory surgery.  Risk  of deep venous thrombosis and pulmonary emboli.  The patient does  understand indications and risks.      Juluis Mire, M.D.  Electronically Signed     JSM/MEDQ  D:  07/20/2008  T:  07/20/2008  Job:  [id number]

## 2011-04-03 NOTE — Op Note (Signed)
NAME:  [name], [name]              ACCOUNT NO.:  [id number]   MEDICAL RECORD NO.:  [id number]          PATIENT TYPE:  OIB   LOCATION:  3526                         FACILITY:  [facility]   PHYSICIAN:  Kathaleen Maser. Pool, M.D.    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   PROCEDURE:  C5-6 anterior cervical diskectomy and fusion, allograft,  anterior plating.   SURGEON:  Kathaleen Maser. Pool, MD   ASSISTANT:  Reinaldo Meeker, MD   ANESTHESIA:  General endotracheal.   INDICATION:  [name] is a 55 year old female with a history of neck  and right upper extremity pain, paresthesia, and weakness consistent  with a C6 radiculopathy failing conservative management.  Workup  demonstrates evidence of a rightward C5-6 disk herniation with  compression of the right-sided C6 nerve root.  The patient has failed  conservative management.  She presents now for C5-6 anterior cervical  diskectomy and fusion with allograft and anterior plating with hopes of  improvement in her symptoms.   OPERATION:  The patient was taken to the operating room and placed on  the table in supine position.  After adequate level of anesthesia was  achieved, the patient was positioned supine with neck slightly extended  and held in place with halter traction.  The patient's anterior cervical  region was prepped and draped sterilely.  A #10 blade was used to make a  linear skin incision overlying the C5-6 interspace.  This was carried  down sharply to the platysma.  The platysma was then divided vertically  and dissection proceeded on the medial border of the sternocleidomastoid  muscle and carotid sheath.  Trachea and esophagus were mobilized and  tracked towards the  left.  The prevertebral fascia was stripped off the  anterior spinal column.  Longus colli muscle was elevated bilaterally  using electrocautery.  Deep self-retaining retractor was placed.  Intraoperative fluoroscopy was used and the levels were confirmed.  Disk  space at C5-6 was then incised with 15 blade in a rectangular fashion.  Wide disk space clean-out was achieved using pituitary rongeurs, forward  and backward Karlin curettes, Kerrison rongeurs, and high-speed drill.  Elements of the disk removed down to the level of the posterior annulus.  Microscope was then brought into the field and used throughout the  remainder of diskectomy.  The remaining aspects of annulus and  osteophytes were removed using high-speed drill down to the level of the  posterior longitudinal ligament.  The posterior longitudinal ligament  was then elevated and resected in piecemeal fashion using Kerrison  rongeurs.  The underlying thecal sac and exiting C6 nerve roots were  identified bilaterally.  All elements of the disk herniation were  resected.  Wide anterior foraminotomies were then performed along the  course of the exiting C6 nerve roots.  At this point, a very thorough  decompression was  achieved.  There was no evidence of injury to the  thecal sac or nerve roots.  The wound was then irrigated with antibiotic  solution.  Gelfoam was placed topically for hemostasis, which was found  to be good.  Microscope was then used for interbody fusion.  A 7-mm  Cornerstone allograft wedge was then packed into place and recessed  approximately 1 mm from the anterior cortical margin.  A 23-mm Atlantis  anterior cervical plate was then placed over the C5 and C6 levels.  This  was then attached under fluoroscopic guidance using 13-mm variable angle  screws to each at both levels.  All four screws were given final  tightening and found to be solid bone.  The locking screws were engaged  at both levels.  All four  screws were given final tightening.  The  locking screws were engaged in both levels.  Final images revealed good  position of bone grafts, hardware at proper operative level with normal  alignment of spine.  The wound was then irrigated with antibiotic  solution.  Hemostasis was ensured with bipolar electrocautery.  The  wound was then closed in typical fashion.  Steri-Strips and sterile  dressings were applied.  There were no complications.  The patient  tolerated the procedure well, and she returns to the recovery room  postoperatively.           ______________________________  Kathaleen Maser Pool, M.D.     HAP/MEDQ  D:  01/04/2009  T:  01/05/2009  Job:  [id number]

## 2011-04-03 NOTE — Op Note (Signed)
NAME:  [name], [name]              ACCOUNT NO.:  [id number]   MEDICAL RECORD NO.:  [id number]          PATIENT TYPE:  OIB   LOCATION:  9308                          FACILITY:  [facility]   PHYSICIAN:  Juluis Mire, M.D.   DATE OF BIRTH:  1956/10/20   DATE OF PROCEDURE:  07/20/2008  DATE OF DISCHARGE:                               OPERATIVE REPORT   PREOPERATIVE DIAGNOSIS:  Symptomatic rectocele.   POSTOPERATIVE DIAGNOSIS:  Symptomatic rectocele.   PROCEDURE:  Posterior repair using a Pinnacle mesh systems.   ANESTHESIA:  General endotracheal.   SURGEON:  Juluis Mire, MD   ESTIMATED BLOOD LOSS:  200 mL.   PACKS:  Include, vaginal pack.   DRAINS:  Include, urethral Foley.   There was no intraoperative blood replacement.   COMPLICATIONS:  Complications.   INDICATIONS:  Dictated history and physical.   PROCEDURE:  The patient was taken to the OR, placed in the supine  position.  After satisfactory level of general endotracheal anesthesia  was obtained.  The patient was placed in the dorsal spine position.  The  perineum and vagina prepped out with Betadine.  The patient was draped  in sterile field.  Exam revealed a mild cystocele, but moderate  rectocele.  No enterocele was noted.  We infiltrated the posterior  vaginal mucosa with a diluted solution of 1% Xylocaine with epinephrine.  We used this for hydrodissection.  Next, the incision was made in the  skin over the perineum.  The skin was dissected up to the vaginal  opening and excised.  We underlined the vaginal mucosa in the midline  dissected all way up to vaginal apex.  We incised the vaginal mucosa in  the midline approximately 3-4 cm from the introitus.  At this point in  time, we were able to bluntly dissect out laterally to both sacrospinous  ligaments which were easily determined.  At this point in time, we  identified the top of the vaginal cuff and we placed three 0 Vicryl  sutures, 1 in the midline and 2 be  laterally.  These were held.  Next,  we were brought the Pinnacle posterior repair system.  Using the Capio,  we secured the arms to both uterosacral ligaments approximately a  fingerbreadth from the sacrum.  We were able then to bring the dilators  through the uterosacral ligaments and tightened up the mesh.  We then  elevated the mesh.  We used a several 2-0 Vicryl sutures to reduce the  rectocele.  At this point time, we continued tightening up the mesh.  Once we were near the apex, the top of the mesh was trimmed.  We secured  it to the top of the vaginal cuff with the sutures of 0 Vicryl.  This  point time, we kept tugging on the arms until we had good elevation of  the vaginal apex to appropriate levels.  We could easily see the plastic  at this point in time.  At this point in time, we removed by cutting the  plastic sheath and 1 suture removed the plastic sheaths were  both sides.  At this point in time, we trimmed the distal portion of the mesh secured  to the perineal body and laterally to the paravaginal tissue with  sutures of 2-0 Vicryl.  The vaginal mucosa then closed with interrupted  sutures of 2-0 Vicryl.  Perineum body was rebuilt with 2-0 Vicryl and  the skin was closed with running subcuticular of 2-0 Vicryl.  We had  good hemostasis.  We had good reduction of the rectocele.  Rectal exam  revealed no evidence of injury to the rectum.  There was no tight band  noted.  A Kerlix vaginal pack was put in place.  Foley was placed  straight drain with clear urine.  The patient was stable and  hemostatically intact.  Sponge, instrument, and needle count were  reported as correct by circulating nurse.  The patient taken out of  dorsal lithotomy position, once alert and extubated, transferred  recovery room in good condition.      Juluis Mire, M.D.  Electronically Signed     JSM/MEDQ  D:  07/20/2008  T:  07/20/2008  Job:  [id number]

## 2011-04-06 NOTE — Assessment & Plan Note (Signed)
[facility]                         GASTROENTEROLOGY OFFICE NOTE   NAME:[name], [name]                     MRN:          [id number]  DATE:02/03/2008                            DOB:          1956/07/11    HISTORY OF PRESENT ILLNESS:  [name] is a 55 year old, very complex  white female, referred by Dr. Arelia Sneddon for a history of chronic guaiac  positive stools, despite previous negative GI evaluations by Dr. Kinnie Scales  and Dr. Dorena Cookey.   [name] has chronic acid reflux. She has had several endoscopy  exams and is on chronic Nexium 40 mg daily. The patient apparently has  had problems in the past with endoscopic procedures because of multiple  drug allergies, chronic drug use, and very high tolerance to any  medications with inability to sedate the patient with conscious sedation  techniques. In fact, she has had several colonoscopies by Dr. Madilyn Fireman,  ultrasounds, and endoscopies over the last several years, all of which  have been unremarkable. Most recent colonoscopy was performed in June of  2007 and was unremarkable.   [name] has constipation predominant irritable bowel syndrome with  gas and bloating but seems to be doing fairly well at this time, taking  occasional stool softeners. To me, she denies melena or hematochezia,  severe abdominal pain, anorexia, weight loss, nausea and vomiting. Her  constipation is so severe sometimes that she does have to manually self-  disimpact her rectum, she does have an enlarged rectocele, which Dr.  Arelia Sneddon apparently has planned to repair. I do not have these notes for  review. The patient denies specific hepatobiliary complaints or history  of hepatitis, although records do mention prior history of fatty  infiltration of liver.   PAST MEDICAL HISTORY:  1. Type 2 diabetes mellitus, which is managed by dietary therapy.  2. Chronic anxiety and depression.  3. Sleep apnea.  4. Previous repair of  a ventral abdominal wall hernia with mesh      implantation by Dr. Cyndia Bent.  5. Hysterectomy in 1983.  6. Appendectomy.  7. She suffers from a variety of functional disorders including      interstitial cystitis, fibromyalgia, restless leg syndrome, chronic      fatigue, and chronic pain syndrome.   MEDICATIONS:  1. Benicar 20 mg daily.  2. Wellbutrin XL 150 mg several times a day.  3. Cardizem LA 240 mg daily.  4. Celexa 40 mg daily.  5. Cyclobenzaprine 10 mg daily.  6. Doxycycline 100 mg daily.  7. Lavasa 1 gram b.i.d.  8. Daily Fentanyl patch.  9. Lasix 20 mg daily.  10.Hydrocodone 5/500 mg p.r.n.  11.Naproxen 500 mg daily.  12.Nexium 40 mg daily.  13.Phenergan 25 mg at bedtime.  14.Requip 2 mg at bedtime.  15.Trazadone 100 mg at night.   ALLERGIES:  She has a variety of multiple drug allergies, which are too  numerous to list but are listed in the chart and do include SULFA DRUGS.   FAMILY HISTORY:  Breast cancer in her grandmother and diabetes mellitus  in her father. She has had father  who has had apparently idiopathic  pancreatitis.   SOCIAL HISTORY:  She is divorced and lives by herself. She is apparnetly  disabled because of her multiple medical problems. 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 She does not smoke or abuse ethanol.   REVIEW OF SYSTEMS:  Pretty much positive for multiple complaints  including edema, frequent urination, arthralgias, myalgias, insomnia,  excessive thirst, night sweats, back pain, chronic fatigue, urinary  incontinence, myalgias, etc. She denies any specific cardiovascular,  pulmonary complaints at this time.   PHYSICAL EXAMINATION:  GENERAL: A healthy appearing white female in no  acute distress. Appears her stated age.  VITAL SIGNS:  Height 5 foot 6 and weight is 267 pounds. Blood pressure  134/76, pulse 70 and regular.  HEENT:  I could not appreciate stigmata of chronic liver disease or  thyromegaly.   CHEST/HEART:  Entirely clear and I could not appreciate murmurs, gallops  or rubs. Appeared to be regular rhythm.  ABDOMEN:  Showed no organomegaly, masses, tenderness, or distention.  Bowel sounds were normal.  EXTREMITIES:  Unremarkable.  NEUROLOGIC:  Mental status was generally clear.  RECTAL:  Unremarkable except for a very firm, hard, almost impacted  stool in the rectal vault. Stool guaiac negative.   ASSESSMENT:  1. Constipation, predominant irritable bowel syndrome with probable      element of rectal outlet dysfunction with an associated rectocele      with planned surgical repair by Dr. Arelia Sneddon. I see no need for      repeat colonoscopy at this time, since she had a negative      colonoscopy within the last 2 years.  2. Intermittent positive guaiac stools, probably related to      gastroesophageal reflux disease and NSAID use.  3. History of chronic abdominal pain with previous ventral hernia      repair with mesh implantation by Dr. Jamey Ripa.  4. History of essential hypertension and glucose intolerance.  5. Chronic anxiety and depression.  6. Multiple functional problems including interstitial cystitis,      fibromyalgia, restless leg syndrome, chronic pain syndrome, chronic      fatigue, etc.  7. Multiple drug allergies.   RECOMMENDATIONS:  1. I see no need for repeat endoscopy and colonoscopy at this time. As      mentioned above, the patient's stools were guaiac negative.  2. Will check CBC and anemia profile and decide if this patient needs      further GI evaluation. If so, will probably do an entero-capsule      small bowel examination.  3. Benefiber in the morning with fiber cereal, liberal p.o. fluids,      and MiraLax 8 ounces at bedtime on a regular basis.  4. I see no contraindication to rectocele repair per Dr. Arelia Sneddon.  5. The patient needs primary care establishment with a new primary      care physician. Apparently, Dr. Barbee Shropshire was per primary care       physician and has left his practice.     Vania Rea. Jarold Motto, MD, Caleen Essex, FAGA  Electronically Signed    DRP/MedQ  DD: 02/03/2008  DT: 02/03/2008  Job #: [id number]   cc:   Juluis Mire, M.D.  [facility]  Jamison Neighbor, M.D.  Currie Paris, M.D.

## 2011-04-06 NOTE — Op Note (Signed)
NAME:  [name], [name]                        ACCOUNT NO.:  [id number]   MEDICAL RECORD NO.:  [id number]                   PATIENT TYPE:  AMB   LOCATION:  DAY                                  FACILITY:  [facility]   PHYSICIAN:  Jamison Neighbor, M.D.               DATE OF BIRTH:  [birth year]   DATE OF PROCEDURE:  09/11/2002  DATE OF DISCHARGE:  09/11/2002                                 OPERATIVE REPORT   PREOPERATIVE DIAGNOSIS:  Chronic pelvic pain consistent with interstitial  cystitis.   POSTOPERATIVE DIAGNOSIS:  Chronic pelvic pain consistent with interstitial  cystitis.   OPERATION PERFORMED:  Cystoscopy, urethral calibration, hydrodistention of  the bladder, Marcaine and Pyridium instillation, Marcaine and Kenalog  injection.   SURGEON:  Jamison Neighbor, M.D.   ANESTHESIA:  General.   COMPLICATIONS:  None.   DRAINS:  None.   INDICATIONS FOR PROCEDURE:  The patient is a 55 year old who has a lower  urinary tract urgency, frequency and pain felt to be consistent with some  variation of interstitial cystitis.  We note at the time of her  hydrodistention that her bladder capacity is almost normal at approximately  1000 cc and that she really does not much in glomerulation and normally one  would consider that as not consistent with an interstitial cystitis  diagnosis.  We do note, however, that the patient has marked improvement in  her symptoms whenever she has a hydrodistention and in fact it is the only  treatment that seems to help the patient in any way.  The patient has  requested that repeat hydrodistention be performed.  She is aware of the  fact that there is no guarantee that she will have any improvement in her  symptoms, but that certainly we will perform the procedure in the way an  identical one has been done in the past.  The patient gave full and informed  consent.   DESCRIPTION OF PROCEDURE:  After successful induction of general anesthesia,  the patient  was placed in the dorsal lithotomy position, prepped with  Betadine and draped in the usual sterile fashion.  A careful bimanual  examination revealed no abnormalities, specifically no significant  cystocele, rectocele, or enterocele.  The patient had no masses on bimanual  exam and no evidence of discharge from the urethra or vagina.  The urethra  was calibrated to 78 [place] with female urethral sounds with no signs of  stenosis or stricture.  The bladder was carefully inspected with a  cystoscope with both 12 degree and 70 lenses.  No tumors or stones could be  seen and ureteral orifices were unremarkable.  The bladder was distended at  a pressure of 100 cmH2O for five minutes.  When the bladder was drained,  there was not much in the way of glomerulation test as was described above,  the appearance was unchanged from previous cystoscopies and hydrodistention.  There was no need for biopsy.  A mixture of Marcaine and Pyridium was  instilled in the bladder.  Marcaine and Kenalog were injected  periurethrally.  The patient was given intraoperative Toradol and Zofran and  was also given intraoperative B&O suppository.  She will  be given pain medications in the recovery room and will be sent home with  Lorcet Plus, Levaquin, Phenergan and Pyridium Plus for management of her  postoperative symptoms.  She will return in follow-up to determine if she  has had improvement.                                                 Jamison Neighbor, M.D.    RJE/MEDQ  D:  10/09/2002  T:  10/09/2002  Job:  [id number]

## 2011-04-06 NOTE — Op Note (Signed)
NAME:  [name], [name]                        ACCOUNT NO.:  [id number]   MEDICAL RECORD NO.:  [id number]                   PATIENT TYPE:  AMB   LOCATION:  DAY                                  FACILITY:  [facility]   PHYSICIAN:  Juluis Mire, M.D.                DATE OF BIRTH:  November 04, 1956   DATE OF PROCEDURE:  01/18/2004  DATE OF DISCHARGE:                                 OPERATIVE REPORT   PREOPERATIVE DIAGNOSIS:  Chronic pelvic pain.   POSTOPERATIVE DIAGNOSIS:  Chronic pelvic pain with no evidence of pelvic  pathology.   OPERATIVE PROCEDURE:  Open laparoscopy.   SURGEON:  Juluis Mire, M.D.   ANESTHESIA:  General endotracheal.   ESTIMATED BLOOD LOSS:  Minimal.   PACKS AND DRAINS:  None.   INTRAOPERATIVE BLOOD REPLACED:  None.   COMPLICATIONS:  None.   INDICATIONS:  Dictated in the history and physical.   The procedure is as follows:  The patient was taken to the OR and placed in  the supine position.  After a satisfactory level of general endotracheal  anesthesia obtained, the patient was placed in the dorsal lithotomy  position.  Dr. Logan Bores completed the cystoscopy.  At this point in time we  prepped the abdomen, perineum, and vagina.  A sponge on a sponge stick was  placed in the vaginal vault.  The bladder had already been emptied by Dr.  Logan Bores.  The patient was draped out for laparoscopy.  A subumbilical incision  was made with a knife, extended through the subcutaneous tissue.  The fascia  was identified and entered sharply.  The incision in the fascia was extended  laterally.  The edge of the mesh from her ventral hernia was identified.  We  went down below this, identified the peritoneum, and entered it.  There was  no evidence of injury to adjacent organs.  Open laparoscopic trocar was put  in place and secured, the abdomen was insufflated with carbon dioxide, the  laparoscope was introduced.  There was no evidence of injury to adjacent  organs.  The upper  abdomen and both lateral gutters were clear.  A 5 mm  trocar was put in place in the suprapubic area under direct visualization.  Visualization revealed the uterus to be surgically absent.  The top of the  vagina was clear.  The cul-de-sac was clear.  No active process such as  endometriosis.  Both ovaries were high on the pelvic sidewall and free.  There was no evidence of adhesions or other pelvic complications.  At this  point in time the abdomen was deflated of its carbon dioxide, all trocars  removed, the subumbilical fascia closed with two figure-of-eights of 0  Vicryl, skin with interrupted subcuticulars of 4-0 Vicryl.  The suprapubic  incision was closed  with Dermabond.  The sponge on a sponge stick was removed from the vaginal  vault.  The sponge,  instrument, and needle count were reported as correct by  the circulating nurse x2.  The patient was once extubated was transferred to  the recovery room in good condition.                                               Juluis Mire, M.D.    JSM/MEDQ  D:  01/18/2004  T:  01/18/2004  Job:  [id number]

## 2011-04-06 NOTE — Op Note (Signed)
[name], [name]              ACCOUNT NO.:  [id number]   MEDICAL RECORD NO.:  [id number]          PATIENT TYPE:  AMB   LOCATION:  [facility]                         FACILITY:  [facility]   PHYSICIAN:  Jamison Neighbor, M.D.  DATE OF BIRTH:  12-06-1955   DATE OF PROCEDURE:  06/12/2005  DATE OF DISCHARGE:                                 OPERATIVE REPORT   SERVICE:  Urology.   PREOPERATIVE DIAGNOSIS:  Interstitial cystitis.   POSTOPERATIVE DIAGNOSIS:  Interstitial cystitis.   PROCEDURE:  Cystoscopy, urethral calibration, hydrodistention of the  bladder, Marcaine and Pyridium installation, Marcaine and Kenalog injection.   SURGEON:  Jamison Neighbor, M.D.   ANESTHESIA:  General.   COMPLICATIONS:  None.   DRAINS:  None.   BRIEF HISTORY:  This 55 year old female has chronic pelvic pain and tends to  respond well to hydrodistention. She is now to undergo repeat  hydrodistention. She understands the risks and benefits of the procedure and  gave full and informed consent.   DESCRIPTION OF PROCEDURE:  After successful induction of general anesthesia,  the patient was placed in the dorsal lithotomy position, prepped with  Betadine and draped in the usual sterile fashion. Careful bimanual  examination showed no cystocele, rectocele or enterocele. The patient did  receive a B&O suppository prior to the procedure. The urethra was calibrated  to a 36 [place] with female urethral sounds, no signs of any stenosis or  stricture were identified. The cystoscope was inserted, the bladder was  carefully inspected and was free of any tumor or stones. Both ureteral  orifices were normal in configuration and location. No tumors or stones were  seen. Hydrodistention of the bladder was then performed at a pressure of 100  cmH2O for five minutes. When the bladder was drained, the patient's bladder  capacity was 750 mL. The bladder did not exhibit much in the way of  glomerulations and no ulcers could be seen.  The patient's bladder was  drained completely. A mixture of Marcaine and Pyridium was left within the  bladder, Marcaine and Kenalog were injected periurethrally. The patient  tolerated the procedure well and was taken to the recovery room in good  condition.    _______________   RJE/MEDQ  D:  06/12/2005  T:  06/12/2005  Job:  [id number]

## 2011-04-06 NOTE — Op Note (Signed)
[name], [name]              ACCOUNT NO.:  [id number]   MEDICAL RECORD NO.:  [id number]          PATIENT TYPE:  AMB   LOCATION:  [facility]                         FACILITY:  [facility]   PHYSICIAN:  Jamison Neighbor, M.D.  DATE OF BIRTH:  [birth year]   DATE OF PROCEDURE:  10/22/2006  DATE OF DISCHARGE:                               OPERATIVE REPORT   PREOPERATIVE DIAGNOSIS:  Interstitial cystitis.   POSTOPERATIVE DIAGNOSIS:  Interstitial cystitis.   PROCEDURE:  Cystoscopy, urethral calibration, hydrodistention of the  bladder, Marcaine and Pyridium instillation, Marcaine and Kenalog  injection.   SURGEON:  Jamison Neighbor, M.D.   ANESTHESIA:  General.   COMPLICATIONS:  None.   DRAINS:  None.   BRIEF HISTORY:  This 55 year old female has severe chronic pelvic pain,  felt to be due at least in part to her bladder.  The patient has  responded in the past quite nicely to hydrodistention.  Unfortunately,  she has numerous allergies and cannot tolerate oral therapy for her  interstitial cystitis.  We do note that over time with instillation  therapy, she has had improvement in her appearance of her bladder but  still has chronic dependent pain and has been treated in a pain clinic.  She does tend to respond with a nice decrease in her symptoms when she  has hydrodistention but because of her lack of response to other  treatments, we have been willing to consider this as ongoing therapy for  her.  The patient understands that if we repeat the hydrodistention,  there is no guarantee that she will have comparable response and that we  only will continue this on an intermittent basis as long as it continues  to give her significant pain relief.  Patient understands the risks and  benefits of the procedure and gave full informed consent.   PROCEDURE:  After the successful induction of general anesthesia, the  patient is placed in a dorsal lithotomy position, prepped with Betadine,  and  draped in the usual sterile fashion.  Careful bimanual examination  revealed a modest cystocele with slight prolapse.  The urethra was  palpably normal with no signs of a diverticulum.  There was no real  vault prolapse, enterocele or rectocele, to speak of.  The urethra was  calibrated to a 59 [place] with female urethral sounds with no signs of  stenosis or stricture.  The cystoscope was inserted.  The bladder was  carefully inspected.  It was free of any tumor or stones.  Both ureteral  orifices were normal in configuration and location.  Hydrodistention of  the bladder was performed.  The bladder was distended at a pressure of  100 cm of water for five minutes.  When the bladder was drained, the  patient had very little in the way of glomerulations, indicating much  improvement in her bladder appearance.  The bladder capacity came in at  1100 cc, which is essentially normal, and there was little in the way of  glomerulations or ulcers.  No biopsy is required, and no cauterization  was needed.  The  bladder was  drained.  A mixture of Marcaine and Pyridium was left in the  bladder.  Marcaine and Kenalog were injected periurethrally.  The  patient tolerated the procedure well and was taken to the recovery room  in good condition.           ______________________________  Jamison Neighbor, M.D.  Electronically Signed     RJE/MEDQ  D:  10/22/2006  T:  10/22/2006  Job:  [id number]   cc:   Olene Craven, M.D.  Fax: [phone]

## 2011-04-06 NOTE — Op Note (Signed)
NAME:  [name], [name]                        ACCOUNT NO.:  [id number]   MEDICAL RECORD NO.:  [id number]                   PATIENT TYPE:  AMB   LOCATION:  [facility]                                 FACILITY:  [facility]   PHYSICIAN:  Jamison Neighbor, M.D.               DATE OF BIRTH:  [birth year]   DATE OF PROCEDURE:  05/11/2003  DATE OF DISCHARGE:                                 OPERATIVE REPORT   PREOPERATIVE DIAGNOSIS:  Interstitial cystitis.   POSTOPERATIVE DIAGNOSES:  1. Interstitial cystitis.  2. Constipation.   PROCEDURES:  1. Cystoscopy.  2. Urethral calibration.  3. Hydrodistention of the bladder.  4. Marcaine and Pyridium instillation.  5. Marcaine and Kenalog injection.  6. Bowel disimpaction.   SURGEON:  Jamison Neighbor, M.D.   ANESTHESIA:  General.   COMPLICATIONS:  None.   DRAINS:  None.   BRIEF HISTORY:  This 55 year old female has chronic pelvic pain felt to be  due to interstitial cystitis.  The patient has a relatively unremarkable-  appearing bladder with only modest glomerulations but has always had a  remarkable response to hydrodistention with near-complete resolution of her  symptoms.  The patient has failed to respond to all forms of oral therapy  but still remains on a complicated medical program because she has  additional problems with chronic fatigue and fibromyalgia.  The patient's  current medications include Duragesic, Wellbutrin, Nexium, Topamax,  Seroquel, Klonopin, Luvox, hydrochlorothiazide, Zyrtec, hydrocodone for  breakthrough pain, Axert for headaches, Pyridium Plus, Flexeril, Phenergan,  and Xanax.  Aside from the lack of gangliar therapy, because of an allergic  response to Elmiron, there really is very little that can be offered to the  patient from the standpoint of oral therapy and she does not like  intravesical therapy.  For that reason she has requested a cystoscopy and  hydrodistention be performed.  She is aware of the fact that  there is no  guarantee she will have the same response that she has had in the past.  She  gave full and informed consent.   PROCEDURE:  After successful induction of general anesthesia, the patient  was placed in a dorsal lithotomy position, prepped with Betadine, draped in  the usual sterile fashion.  Careful bimanual examination showed no  significant cystocele.  The patient was palpably constipated with impacted  stool detectable through the vagina.  The decision was made to disimpact her  at the end of the procedure.  The urethra was calibrated to 67 [place] with  female urethral sounds with no evidence of stenosis or stricture.  The  cystoscope was inspected and the bladder was carefully inspected.  It was  free of any tumor or stones.  Previous biopsy sites were seen.  Otherwise,  the bladder was unremarkable.  The bladder was then distended at a pressure  of 100 cmH2O for five minutes.  When the bladder was drained,  the bladder  capacity was approximately 800 mL with normal being 1150 and average for  interstitial cystitis being 575.  The patient appears to have burned-out  IC, as there is far less in the way of glomerulations and ulcer formation  than has been seen in the past.  Despite this, however, the patient clearly  does have symptomatic lower urinary tract problems due to her bladder.  The  patient did not require a bladder biopsy.  The bladder was drained.  A  mixture of Marcaine and Pyridium was left in the bladder.  Marcaine and  Kenalog were injected periurethrally.  The patient was disimpacted and then  a B&O suppository was placed.  The patient received intraoperative Toradol  and Zofran as well as her preoperative antibiotics, and she will be sent  home with a prescription for Cipro.  She will continue on the combination of  Duragesic and hydrocodone.  It should be noted that the patient had been on  Ultram for her breakthrough pain and was very pleased with this but  that her  new physician, Dr. Excell Seltzer, felt that she should come off this and use  hydrocodone instead.  This was because Dr. Excell Seltzer was concerned that a  combination of Wellbutrin and Ultram might be problematic insofar as  seizures are concerned.  The patient had never had a problem with this  combination and is going to discuss with Dr. Excell Seltzer the possibility of going  back to Ultram for her breakthrough pain.                                                Jamison Neighbor, M.D.    RJE/MEDQ  D:  05/11/2003  T:  05/11/2003  Job:  [id number]   cc:   Christella Noa, M.D.  [address]  [place], [place] [address]  Fax: [phone]

## 2011-04-06 NOTE — Op Note (Signed)
[name], [name]              ACCOUNT NO.:  [id number]   MEDICAL RECORD NO.:  [id number]          PATIENT TYPE:  AMB   LOCATION:  [facility]                         FACILITY:  [facility]   PHYSICIAN:  Jamison Neighbor, M.D.  DATE OF BIRTH:  [birth year]   DATE OF PROCEDURE:  12/07/2005  DATE OF DISCHARGE:                                 OPERATIVE REPORT   PREOPERATIVE DIAGNOSIS:  Interstitial cystitis.   POSTOPERATIVE DIAGNOSIS:  Interstitial cystitis.   PROCEDURE:  Cystoscopy, urethral calibration, hydrodistention of the  bladder, Marcaine and Pyridium instillation, Marcaine and Kenalog injection.   SURGEON:  Jamison Neighbor, M.D.   ANESTHESIA:  General.   COMPLICATIONS:  None.   DRAINS:  None.   BRIEF HISTORY:  This 55 year old female has chronic pelvic pain, felt to be  due, at least in part to interstitial cystitis.  She has a good response to  hydrodistention and seems to get better improvement in her pain with that  and with any other form of therapy.  She has not responded particularly well  to oral therapy or instillation therapy, so we have been willing to do  hydrodistentions.  We do know, however, that over time, the appearance of  the bladder has actually improved with her medications and treatments, but  the pain still is somewhat persistent.  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  She is now to undergo cystoscopy and  hydrodistention.  She understands the risks and benefits of the procedure  and gave full informed consent.   PROCEDURE:  After the successful induction of general anesthesia, the  patient is placed in a dorsal lithotomy position, prepped with Betadine, and  draped in the usual sterile fashion.  Careful bimanual examination revealed  no cystocele, rectocele, or enterocele.  There were no masses on bimanual  exam.  The cystoscope was inserted.  The bladder was carefully inspected.  It was free of any tumor or stones.  Both  ureteral orifices were normal in  configuration and location.  The previous biopsy site was identified,  otherwise mucosa was unremarkable.  Hydrodistention was performed.  The  bladder was distended at a pressure of 100 cm of water for five minutes.  When the bladder was drained, glomerulations consistent with interstitial  cystitis were seen throughout the bladder.  We do know, however, that the  patient's bladder capacity has definitely improved over time and is now  normal at 1000 cc.  The patient did not require biopsy.  A mixture of  Marcaine and Pyridium was placed in the bladder.  Marcaine and Kenalog  mixture was injected periurethrally.  The patient tolerated the procedure  well and was taken to the recovery room in good condition.   She will be sent home with a prescription for Cipro because she has had some  bronchitis and as well will be used for postoperative antibiotic coverage.  She will continue on her Duragesic and Pyridium Plus and will have Lorcet  Plus for breakthrough pain for just a few days.           ______________________________  Jamison Neighbor, M.D.  Electronically Signed     RJE/MEDQ  D:  12/07/2005  T:  12/07/2005  Job:  [id number]

## 2011-04-06 NOTE — Op Note (Signed)
NAME:  [name], [name]                        ACCOUNT NO.:  [id number]   MEDICAL RECORD NO.:  [id number]                   PATIENT TYPE:  OBV   LOCATION:  0465                                 FACILITY:  [facility]   PHYSICIAN:  Jamison Neighbor, M.D.               DATE OF BIRTH:  03-Dec-1955   DATE OF PROCEDURE:  01/18/2004  DATE OF DISCHARGE:  01/19/2004                                 OPERATIVE REPORT   PREOPERATIVE DIAGNOSIS:  Interstitial cystitis.   POSTOPERATIVE DIAGNOSIS:  Interstitial cystitis.   PROCEDURE:  Cystoscopy, urethral calibration, hydrodistention of the bladder  marking, and previous interstitial cystitis with Marcaine and Kenalog  injections.   ANESTHESIA:  General.   COMPLICATIONS:  None.   DRAINS:  None.   BRIEF HISTORY:  This 55 year old female has a longstanding history of  chronic pelvic pain felt to be secondary to interstitial cystitis.  The  patient has had generally unremarkable cystoscopies but has always  had  marked response to hydrodistention with resolution of her pain which can  last between 6 and 12 months.  The patient is scheduled to undergo  diagnostic laparoscopy and she requested that her repeat hydrodistention be  performed at the same time.  The patient understands the risks and benefits  of the procedure and gave full, informed consent.   PROCEDURE IN DETAIL:  After successful induction of general anesthesia, the  patient was placed in the dorsal lithotomy position, prepped with Betadine,  and draped in the usual sterile fashion.  The patient underwent evaluation  by Dr. Richardean Chimera.  The laparoscopy was unremarkable.  The patient was then  repositioned in preparation for cystoscopy.   Bimanual examination revealed unremarkable urethra.  The patient did not  have specific cystocele, rectocele, or enterocele and no mass on bimanual  exam.  The urethra was normal and accepted a 32-French urethral stem with no  evidence of stenosis or  stricture.  The cystoscope was inserted and the  bladder was carefully inspected and was free of any tumor or stone.  Both  ureteral orifices were normal in configuration and location.  The bladder  was then distended at a pressure of 170 cm of water for 5 minutes and the  bladder was drained.  Bladder glomerulations were seen.  The patient did not  require biopsy.  The patient had a mixture of Marcaine that was previously  left in the bladder.  Marcaine and Kenalog were injected periurethrally.  The patient tolerated the procedure well and was taken to the recovery room  in good condition.  She will be sent home with a prescription for pain  medication, as well as antibiotic coverage.  Jamison Neighbor, M.D.    RJE/MEDQ  D:  02/08/2004  T:  02/09/2004  Job:  [id number]

## 2011-04-06 NOTE — Op Note (Signed)
NAME:  DESLYN, CAVENAUGH                        ACCOUNT NO.:  [id number]   MEDICAL RECORD NO.:  [id number]                   PATIENT TYPE:  AMB   LOCATION:  DSC                                  FACILITY:  [facility]   PHYSICIAN:  Currie Paris, M.D.           DATE OF BIRTH:  July 06, 1956   DATE OF PROCEDURE:  04/03/2004  DATE OF DISCHARGE:                                 OPERATIVE REPORT   OFFICE MEDICAL RECORD NUMBER:  [id number]   PREOPERATIVE DIAGNOSIS:  Enlarging lipoma left anterior thigh.   POSTOPERATIVE DIAGNOSIS:  Enlarging lipoma left anterior thigh.   PROCEDURE:  Excision of lipoma left anterior thigh.   SURGEON:  Currie Paris, M.D.   ANESTHESIA:  MAC.   INDICATIONS FOR PROCEDURE:  This patient is a 55 year old with multiple pain-  related problems, who also has multiple lipomas some of which have been  excised in the past.  She has one on her left thigh which she first noticed  a few years ago at which time it was the same size as one of mirror image on  the right thigh.  However, the one on the left has increased such that it is  about four times the size of the one on the right.  It measures 4 cm.  Because of its size and location and the patient's chronic pain medications,  we elected to do her with some IV sedation.   DESCRIPTION OF PROCEDURE:  The patient was seen in the holding area and  after she had no further questions, the lesion in question was marked and  identified.  She was taken to the operating room and given some IV sedation.  The area was prepped, draped, and anesthetized with a combination of 1%  Xylocaine with epinephrine and 0.5% Marcaine mixed equally.  I injected all  around it and all of the overlying skin.  I made a skin incision and  identified what appeared to be a lipoma and with very gentle blunt  dissection it came out intact and in toto.  Bleeding was controlled with the  cautery and the incision was closed in layers with 3-0 Vicryl  followed by 3-  0 Monocryl subcuticular and Dermabond.  The patient tolerated the procedure  well.  There were no operative complications and all counts were correct.                                               Currie Paris, M.D.    CJS/MEDQ  D:  04/03/2004  T:  04/03/2004  Job:  [id number]   cc:   Milus Banister, M.D.

## 2011-04-06 NOTE — Op Note (Signed)
[name], Rowe              ACCOUNT NO.:  [id number]   MEDICAL RECORD NO.:  [id number]          PATIENT TYPE:  INP   LOCATION:  3037                         FACILITY:  [facility]   PHYSICIAN:  Sharlet Salina T. Hoxworth, M.D.DATE OF BIRTH:  Jul 26, 1956   DATE OF PROCEDURE:  06/05/2006  DATE OF DISCHARGE:                                 OPERATIVE REPORT   PRE AND POSTOPERATIVE DIAGNOSIS:  Ventral incisional hernia.   SURGICAL PROCEDURES:  Open repair of ventral incisional hernia with Kugel  mesh.   SURGEON:  Dr. Johna Sheriff.   ASSISTANT:  Dr. Cicero Duck.   ANESTHESIA:  General.   BRIEF HISTORY:  [name] is a 55 year old female with morbid obesity,  has a history of three previous repairs of initially a primary epigastric  midline ventral hernia, followed by open repair with mesh, followed by  laparoscopic repair last in 2001.  She now has developed a symptomatic  recurrence, confirmed on exam and, after consent with discussion of options  in the office, we have elected to proceed with repair.  The procedure was  discussed in detail, including risks of bleeding, infection, bowel injury,  recurrence, cardiorespiratory complications.  The patient is now brought to  operating room for this repair.   DESCRIPTION OF OPERATION:  The patient was brought to the operating room,  placed in supine position on the operating table, and general endotracheal  anesthesia was induced.  She received preoperative broad-spectrum  antibiotics.  The abdomen was widely sterilely prepped and draped with  Ioban.  Correct patient and procedure were verified.  The previous upper  midline incision was used, and dissection carried down through subcutaneous  tissue with cautery.  Dissection was carried down onto the previously placed  anterior piece of polypropylene mesh.  There appeared to be some moderate  bulging of this mesh, but not a discrete hernia defect initially.  The mesh  was incised along the  center portion and dissection carried down into the  peritoneal cavity.  There were omental adhesions underneath the mesh, but no  bowel, and these were taken off and the lateral edges of the mesh defined in  the peritoneal cavity.  There was a good-sized hernia that had occurred  laterally to the mesh.  Again, fortunately there were only omental adhesions  which were all taken down from the mesh posteriorly, and then the mesh was  completely excised.  Working more inferiorly, down toward the umbilicus, the  laparoscopically placed mesh was encountered which overlapped somewhat with  the more superior mesh that had been placed anteriorly, and again, there  were fortunately omental adhesions, that were fairly filmy, to the Gore-Tex  mesh, which were taken down intra-abdominally, the edge of the mesh defined,  and it was also completely excised, leaving the more lateral intact fascia  in place.  Once all the mesh was removed and all adhesions taken down from  the anterior abdominal wall, the defect measured approximately 10-12 cm in  length by 5-6 cm in width.  I chose a [employer] Kugel mesh, measuring 17 x 13  cm.  It was  placed intra-abdominally on top of omentum and then 0 Prolene  sutures were used deeply into the abdominal wall to suture the outer ring  out away from the defect for at release several centimeters in all  directions, using fixation sutures.  Following this the ProTack was used to  further affix the edge of the mesh circumferentially to close all gaps.  The  fascia which had been debrided was able then to be closed in the midline  with interrupted 0 Prolene sutures, incorporating the mesh in this closure.  The mesh and the wound had been  irrigated with antibiotic solution prior to this closure.  Skin was then  closed with staples.  Sponge and needle counts were correct.  Dry sterile  dressings were applied, as well as an abdominal binder.  The patient was  taken recovery in  good condition.      [name]. Hoxworth, M.D.  Electronically Signed     BTH/MEDQ  D:  06/05/2006  T:  06/06/2006  Job:  [id number]

## 2011-04-06 NOTE — Op Note (Signed)
[facility]  Patient:    [name], [name]                     MRN: [id number] Proc. Date: 09/06/00 Adm. Date:  [id number] Attending:  Londell Moh CC:         Heather Roberts, M.D.   Operative Report  SERVICE:  Urology.  PREOPERATIVE DIAGNOSES:  Interstitial cystitis.  POSTOPERATIVE DIAGNOSES:  Interstitial cystitis.  PROCEDURE:  Cystoscopy, urethral calibration, hydrodistention of the bladder, Marcaine and Pyridium installation, Marcaine and Kenalog injection, Clorpactin lavage.  SURGEON:  Dr. Logan Bores.  ANESTHESIA:  General.  COMPLICATIONS:  None.  DRAINS:  None.  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 The patient is felt to have interstitial cystitis. She has had previous cystoscopies and hydrodistentions and has always had an excellent clinical response. We note however that the patient does not generally have classic glomerulations but she has always had 9-12 months of relief following distention. The patient had her last distention in October of last year. She has now requested a repeat cystoscopy be performed. She understands the risks and benefits of the procedure including the fact there is no guarantee that she will have the same response she has had in the past. She gave full and informed consent.  DESCRIPTION OF PROCEDURE:  After successful induction of general anesthesia, the patient was placed in the dorsal lithotomy position and prepped with Betadine and draped in the usual sterile fashion. The urethra was calibrated to a 30 [place] with [employer]. The cystoscope was inserted and the bladder was carefully inspected and was free of any tumor or stone. Both ureteral orifices were normal in configuration and location. Clear urine was seen to efflux from the ureters. The patient had hydrodistention of the bladder performed in 100 cm of pressure. When the bladder was drained, she  had a bladder capacity of just under 1 liter which is certainly normal but fortunately we have always noted in the past that she has not had fibrosis and shrinkage of the bladder. The were glomerulations seen as well as some ulcerated areas but nothing that was felt to warrant fulguration. The patient did not need assistance ______. A Clorpactin lavage was performed at low pressure. Following this, the patient had Marcaine and Pyridium instilled in the bladder and Marcaine and Kenalog injected periurethrally. A gauze was placed in the vagina. The patient tolerated the procedure well and was taken to the recovery room in good condition. We will discuss further options with the patient since she has had poor response to most oral agents. These are not likely to be helpful. She did respond to elmiron but had side effects and ______ possibly we will give to the patient is the possibility of intravesical elmiron. Her bladder capacity is a little too high for her to qualify for the SKK study and the patient is not interested in that because it does require placebo control. These other options will be discussed with the patient and her follow-up is in 3 weeks. DD:  09/06/00 TD:  09/06/00 Job: [id number]

## 2011-04-06 NOTE — Op Note (Signed)
[facility]  Patient:    [name], [name] Visit Number: [id number] MRN: [id number]          Service Type: NES Location: [facility] Attending Physician:  Londell Moh Dictated by:   Jamison Neighbor, M.D. Proc. Date: 01/27/02 Admit Date:  01/27/2002   CC:         Heather Roberts, M.D.   Operative Report  PREOPERATIVE DIAGNOSIS:  Interstitial cystitis.  POSTOPERATIVE DIAGNOSIS:  Interstitial cystitis.  PROCEDURE:  Cystoscopy, urethral calibration, hydrodistention of the bladder, Marcaine and Pyridium instillation, Marcaine and Kenalog injection.  SURGEON:  Jamison Neighbor, M.D.  ANESTHESIA:  General.  COMPLICATIONS:  None.  DRAINS:  None.  BRIEF HISTORY:  This 55 year old female has chronic bladder pain consistent with interstitial cystitis.  The patient does have glomerulations whenever she undergoes cystoscopy as well as evidence of fibrotic changes within the bladder but, fortunately, has never developed a particularly small bladder capacity.  The patient does respond nicely to hydrodistention which is fortunate because she does not tolerate medications for this disease at all and does not do well with instillation therapy.  The patient is specifically allergic to Elmiron.  The patient is managed in pain clinic and does use Duragesic patches for pain management.  She has requested a hydrodistention be performed.  She understands the risks and benefits of the procedure, having undergone this several times.  She was once again reminded that there is no guarantee she will have the same results with the procedure that she has had in the past.  She gave full and informed consent.  DESCRIPTION OF PROCEDURE:  After the successful induction of general anesthesia, the patient was placed in the dorsal lithotomy position and prepped with Betadine and draped in the usual sterile fashion.  Careful bimanual examination revealed no palpable masses.   There was no evidence of any urethral diverticulum.  There was no real cystocele, rectocele, or enterocele.  The urethra was calibrated to 27 [place] with female urethral sounds with no signs of stenosis or stricture.  Cystoscope was inserted, and the bladder was carefully inspected.  It was free of any tumor or stones. Both ureteral orifices were normal in configuration and location.  The bladder was then distended at a pressure of 100 cm of water for five minutes.  When the bladder was drained, glomerulations were seen as well as marked areas of scarring and fibrosis.  The bladder capacity remains normal at just under 1000 cc.  The patient had no areas that required biopsy or fulguration, and there were no true Hunners ulcers.  The bladder was drained.  A mixture of Marcaine and Pyridium was left within the bladder.  Marcaine and Kenalog were injected periurethrally.  The patient was given a B&O suppository and did receive intraoperative Toradol and Zofran.  The patient will be sent home with a prescription for Levaquin for three days and will be given Phenergan suppositories because she has had postoperative nausea in the past.  She will continue with her pain medication as well as with the use of Pyridium for postoperative symptom management. Dictated by:   Jamison Neighbor, M.D. Attending Physician:  Londell Moh DD:  01/27/02 TD:  01/27/02 Job: [id number]

## 2011-04-06 NOTE — Procedures (Signed)
[place]  Patient:    [name], [name]                     MRN: [id number] Proc. Date: 03/20/00 Adm. Date:  [id number] Attending:  Louie Bun                           Procedure Report  PROCEDURE PERFORMED:  Esophagogastroduodenoscopy with Savary dilatation.  INDICATIONS:  Patient referred for colonoscopy due to heme positive stools, who also has recurrent solid food dysphagia and presumably has a subtle stricture, which has responded to dilatation in the past.  PROCEDURE:  The patient was placed in the left lateral decubitus position and placed on the pulse monitor with continuous low-flow oxygen delivered by nasal cannula.  She was sedated with 5 mg IV droperidol, 40 mg IV Demerol and 4 mg IV Versed.  The Olympus video endoscope was advanced under direct vision into the oropharynx and esophagus.  The esophagus was straight and of normal caliber at the squamocolumnar line clearly outlined at 38 cm.  There was no visible stricture, ring, hiatal hernia, esophagitis or other abnormalities of the esophagus or GE junction.  The stomach was entered and a small amount of liquid secretions were suctioned from the fundus.  Retroflexed view of the cardia was unremarkable.  The fundus, body, antrum and pylorus all appeared normal.  The duodenum was entered and both bulb and second portion were well-inspected and appeared to be at their normal limits.  The Savary guidewire was placed through the endoscope channel into the second portion of the duodenum and the scope withdrawn.  The single 17 mm Savary dilator was passed over the guidewire with moderate resistance and no blood seen on withdrawal.  The dilator was removed together with the wire and the patient kept in the endoscopy suite and prepared for colonoscopy.  She tolerated the procedure fairly well, although, she was partly awake during the procedure and was a little bit combative during the  actual dilatation.  IMPRESSION:  Essentially normal endoscopy, status post empiric dilatation for dysphagia with presumed subtle esophageal stricture or ring. DD:  03/20/00 TD:  03/21/00 Job: [id number]

## 2011-04-06 NOTE — Op Note (Signed)
[facility]  Patient:    [name], [name]                     MRN: [id number] Proc. Date: 05/16/01 Adm. Date:  [id number] Attending:  Londell Moh CC:         Currie Paris, M.D.  Heather Roberts, M.D.   Operative Report  SERVICE:  Urology.  PREOPERATIVE DIAGNOSES: 1. Scalp mass. 2. Interstitial cystitis.  POSTOPERATIVE DIAGNOSES: 1. Scalp mass. 2. Interstitial cystitis.  PROCEDURE:  Cystoscopy, urethral calibration, hydrodistention of the bladder, Marcaine and Pyridium installation, Marcaine and Kenalog injection by Dr. Logan Bores, excision of scalp mass by Dr. Jamey Ripa.  ANESTHESIA:  General.  COMPLICATIONS:  None.  DRAINS:  None.  BRIEF HISTORY:  This 55 year old female has well demonstrated interstitial cystitis. She does not have a low bladder capacity but has chronic pain that always responds to hydrodistention. The patient does not do well with insufflation therapy, she is allergic to [facility] and there are not a lot of other good options for her management. The patient has been seen in the pain clinic, she takes Duragesic patches, Ultram, Flexeril, Xanax, seroquel, Wellbutrin, Phenergan, Naprosyn, Pyridium plus, Luvox, ______ and Prevacid. The patient is also known to have a mass on the scalp. This had previously been excised but the scar is painful and has been bothersome to the patient, Dr. Jamey Ripa is planning to excise that. The patient understands the risks and benefits of both procedures and gave full and informed consent.  DESCRIPTION OF PROCEDURE:  After successful induction of general anesthesia , the patient was placed in dorsal lithotomy position and prepped with Betadine, draped in the usual sterile fashion. Careful bimanual examination revealed no abnormality recently. The patient had no diverticulum, no cystocele, rectocele or enterocele were noted. The urethra was calibrated to a 69 [place] with a female urethral  sound. The cystoscope was inserted and the bladder was carefully inspected and was free of any tumor or stones. Both ureteral orifices were normal in configuration and location. The patient had a bladder capacity of 1000 cc which is where she had been before, but while this is normal she had glomerulations throughout the bladder. The patient did not require biopsies. This has been done repeatedly. The patients bladder was drained and a mixture of Marcaine and Pyridium were left in the bladder, Marcaine and Kenalog were injected periurethrally. The patient had a B&O suppository placed, she tolerated the procedure well and was taken to the recovery room in good condition. At the same time as the cystoscopic procedure, she had excision of the mass in the scalp and that was closed without difficulty by Dr. Jamey Ripa. DD:  05/16/01 TD:  05/16/01 Job: [id number]

## 2011-04-06 NOTE — Op Note (Signed)
[name], [name]              ACCOUNT NO.:  [id number]   MEDICAL RECORD NO.:  [id number]          PATIENT TYPE:  AMB   LOCATION:  NESC                         FACILITY:  [facility]   PHYSICIAN:  Jamison Neighbor, M.D.  DATE OF BIRTH:  [birth year]   DATE OF PROCEDURE:  10/03/2004  DATE OF DISCHARGE:                                 OPERATIVE REPORT   PREOPERATIVE DIAGNOSIS:  Interstitial cystitis.   POSTOPERATIVE DIAGNOSIS:  Interstitial cystitis.   PROCEDURE:  Cystoscopy, urethral calibration, hydrodistention of the  bladder, Marcaine and Pyridium instillation, Marcaine and Kenalog injection.   SURGEON:  Dr. Logan Bores.   ANESTHESIA:  General.   COMPLICATIONS:  None.   DRAINS:  None.   BRIEF HISTORY:  This 55 year old has had problems with chronic pelvic pain.  She had been evaluated with cystoscopy and hydrodistention. Over the years,  her bladder capacity has actually improved, and she has had decrease in her  bladder pain. The patient does not do well with any form of oral therapy or  instillation therapy but does remarkable well with hydrodistention. She  tends to get these done once a year or so. The patient has requested a  repeat hydrodistention be performed. She is well aware of the fact that any  improvement she has may be short lived, and there is certainly no guarantee  she will have results comparable to what she has had in the past. She gave  full informed consent for the procedure.   PROCEDURE:  After successful induction of general anesthesia, the patient  was placed in a dorsal lithotomy position, prepped with Betadine, and draped  in the usual sterile fashion. Careful bimanual examination revealed no  abnormalities of the urethra. No signs of a diverticulum or suburethral  mass. There was no major cystocele and at most a modest rectocele. There was  no vault prolapse. The urethra was calibrated to 48 [place] with the female  urethral sounds with no evidence of  stenosis or stricture. Cystoscope was  inserted. The bladder was carefully inspected. It was free of any tumors or  stones. Both ureteral orifices were normal in configuration and location.  Hydrodistention of the bladder was then performed at a pressure of 100 cm of  water for 5 minutes. When the bladder was drained, very modest  glomerulations could be seen. This indicates major improvement over the  years through various treatments that had been performed. The patient's  bladder capacity is now just about normal at 1,100 cc, comparing favorable  to an average IC bladder capacity of 575 and quite similar to normal bladder  capacity of asymptomatic woman of 1,150 under anesthesia. The patient did  not require bladder biopsy. A mixture of Marcaine and Pyridium was left  within the bladder, and Marcaine and Kenalog were injected periurethrally  for a postoperative block.  B&O suppository was inserted. The patient  received intraoperative Toradol and Zofran. She will be sent  home with prescription for Tylox. She already has Pyridium Plus. The patient  is currently on antibiotics for another reason, so additional antibiotic  coverage will not be required.  The patient will return to the office in  routine followup.      RJE/MEDQ  D:  10/03/2004  T:  10/03/2004  Job:  [id number]

## 2011-04-06 NOTE — Procedures (Signed)
[place]  Patient:    [name], [name]                     MRN: [id number] Proc. Date: 03/20/00 Adm. Date:  [id number] Attending:  Louie Bun Dictator:   Everardo All Madilyn Fireman, M.D. CC:         Heather Roberts, M.D.             Juluis Mire, M.D.                           Procedure Report  PROCEDURE:  Colonoscopy.  INDICATIONS FOR PROCEDURE:  Heme positive stools on recent routine CPE.  DESCRIPTION OF PROCEDURE:  The patient was placed in the left lateral decubitus position and placed on the pulse monitor with continuous low flow oxygen delivered by nasal cannula.  She was sedated with 50 mg of intravenous Demerol and 5 mg of intravenous Versed in addition to the 50 mg of Demerol, 4 mg of Versed, and 5 mg of ______.  All given for the previous esophagogastroduodenoscopy.  The Olympus video colonoscope was inserted into the rectum, and ultimately advanced to about the mid transverse colon.  The patient was difficult to keep sedated, expressing pain, and eventually demanding the scope be withdrawn.  It got to the point where I was contemplating aborting the procedure.  There was a significant amount of solid stool encountered, and it was felt that the patients tolerance and prep would preclude further visualization.  The scope was then gradually withdrawn, and the visualized portions of the transverse, as well as the descending, sigmoid, and rectum all appeared normal with no masses, polyps, diverticuli, or other mucosal abnormalities.  In the rectum were seen some small internal hemorrhoids with no strong stigma of hemorrhage.  The colonoscope was then withdrawn, and the patient returned to the recovery room in stable condition. She tolerated the procedure well, and there were no immediate complications.  IMPRESSION:  Internal hemorrhoids, otherwise normal limited colonoscopy about the mid transverse colon.  PLAN:  We will probably need  repeat barium enema or possibly repeat Hemoccults first. DD:  03/20/00 TD:  03/21/00 Job: [id number]

## 2011-04-06 NOTE — Discharge Summary (Signed)
[name], [name]              ACCOUNT NO.:  [id number]   MEDICAL RECORD NO.:  [id number]          PATIENT TYPE:  INP   LOCATION:  3037                         FACILITY:  [facility]   PHYSICIAN:  Sharlet Salina T. Hoxworth, M.D.DATE OF BIRTH:  07-28-1956   DATE OF ADMISSION:  06/05/2006  DATE OF DISCHARGE:  06/11/2006                                 DISCHARGE SUMMARY   DISCHARGE DIAGNOSIS:  Ventral incisional hernia.   OPERATIONS AND PROCEDURES:  Open repair of ventral incisional hernia with  mesh, June 05, 2006.   HISTORY OF PRESENT ILLNESS:  [name] is a 55 year old female with a  significant surgical history of an epigastric primary ventral hernia repair  by Dr. Jamey Ripa in about 1991 and then subsequently an anterior repair with  mesh by Dr. Jamey Ripa in 1995.  She then developed recurrent ventral hernia  which I repaired laparoscopically in 2001.  She initially did well with this  but she was, however, morbidly obese, with her weight reaching in excess of  280 pounds.  She has been able to lose 60 pounds recently with diet.  After  losing weight, she has noted an increasing lump that is occasionally  uncomfortable in her epigastrium.  She does have local pain from this area.  No nausea or vomiting.  She has had some occasional hematochezia but has had  a preoperative colonoscopy that was unremarkable.  The patient is now  electively admitted for repair for symptomatic recurrent ventral hernia.   PAST MEDICAL HISTORY:  Surgery as above plus Meckel's diverticulectomy and  appendectomy through a right lower quadrant incision, hysterectomy through a  Pfannenstiel incision by Dr. Dagoberto Ligas, and then laparoscopy for pelvic scar  tissue.  Medically, she is treated for interstitial cystitis and chronic  pain, fibromyalgia, GERD, and adult onset diabetes mellitus.  She has  history of nasal polyps.  She also has sleep apnea and uses a CPAP.   MEDICATIONS ON ADMISSION:  Fentanyl patch 100 mcg  every three days,  Wellbutrin XL 150 mg in the morning, Nexium 40 mg at night, Cardizem LA 240  mg daily, clonazepam 1 mg 1-2 at bedtime, trazodone 50 mg 1 to 3 at bedtime,  Requip 2 mg at bedtime, cyclobenzaprine 10 mg 1-3 a day p.r.n., Phenergan 25  mg q.6h. p.r.n., metformin 1000 mg b.i.d. with meals, Pyridium three times a  day p.r.n., Imitrex 100 mg p.r.n.   ALLERGIES:  MICROBID, DARVOCET, FLAGYL, PARAFON FORTE, LOPID, ALTACE,  BENICAR, NEURONTIN, ELMIRON, AND MORPHINE.   For social history, family history, and review of systems, see H&P.   PHYSICAL EXAMINATION:  She is 5 feet 6 inches 223 pounds.  Vital signs  within normal limits.  Generally, moderately obese white female in no acute  distress.  Pertinent findings limited to the abdomen which revealed a healed  upper midline incision with a moderate sized reducible tender incisional  hernia.   HOSPITAL COURSE:  The patient was admitted on the morning of her procedure  and underwent open repair with a piece of Kugel mesh placed intra-  abdominally.  She tolerated the procedure well but,  as would be expected  from her preoperative history of chronic narcotic use, pain control was  difficult.  She complained of quite a bit of incisional pain despite a dose  of Dilaudid PCA as well as 2 mg per hour of background Dilaudid drip.  However, after 48 hours she began to have some reduction in her pain.  She  was started on clear liquids which she tolerated well.  She was begun on  oral Tylox in an effort to wean her IV narcotics.  The third postoperative  day, she had had some fever to 101.  Chest x-ray revealed some atelectasis  and this responded to pulmonary toilet and decreased pain and her fever  defervesced.  CBC was unremarkable.  Foley was discontinued on the fourth  postoperative day.  The PCA was  discontinued on the fifth postoperative day.  The patient was discharged  home on June 11, 2006.  The wound is healing primarily  without evidence of  infection or complication.  She is afebrile.  Medications are the same as  admission plus Tylox as well as OxyContin for pain.  Follow-up is to be in  my office in two weeks.      Lorne Skeens. Hoxworth, M.D.  Electronically Signed     BTH/MEDQ  D:  07/02/2006  T:  07/02/2006  Job:  [id number]

## 2011-04-06 NOTE — H&P (Signed)
NAME:  [name], AMENDOLA                        ACCOUNT NO.:  [id number]   MEDICAL RECORD NO.:  [id number]                   PATIENT TYPE:  AMB   LOCATION:  DAY                                  FACILITY:  [facility]   PHYSICIAN:  Juluis Mire, M.D.                DATE OF BIRTH:  01-01-1956   DATE OF ADMISSION:  01/18/2004  DATE OF DISCHARGE:                                HISTORY & PHYSICAL   This is a 55 year old gravida 3, para 2, abortus 1 female who presents for  diagnostic laparoscopy with laser standby for evaluation of pelvic pain.   In relation to the present admission, the patient has had a previous  laparoscopic-assisted vaginal hysterectomy for management of pelvic pain.  She had been doing fairly well from that standpoint.  She had been actively  managed by Dr. Marcelyn Bruins for interstitial cystitis.  He is planning to do  cystoscopy on her today.  The patient is desirous of laparoscopy just to  rule out any other pelvic processes, review of her chronic pelvic pain,  which indeed may be in response to her interstitial cystitis.   In term of allergies, the patient is allergic to DARVOCET, MACROBID,  ELMIRON, NEURONTIN, PARAFORTE.   MEDICATIONS:  Duragesic patch, tramadol, Prevacid, Wellbutrin, Topamax.  Seroquel, Pyridium, Flexeril, Xanax, and Phenergan.   PAST MEDICAL HISTORY:  Significant for history of fibromyalgia as well as  interstitial cystitis.  She is on numerous medications, as noted above.   PAST SURGICAL HISTORY:  1. Tonsillectomy and adenoidectomy.  2. She has had previous laparoscopies in the past for evaluation of the     pelvic status.  3. In the past had exploratory laparotomy with right ovarian cystectomy.  4. Appendectomy.  5. Removal of Meckel's diverticulum.  6. She has had repair of a previous ventral hernia.  7. Previous laparoscopic-assisted vaginal hysterectomy.  8. Followup laparoscopy in 1995.  9. Subsequent repeat laparoscopy in 1996.   OBSTETRICAL HISTORY:  She has had vaginal delivery in 1995, spontaneous  abortion in 1985, spontaneous vaginal delivery in 1990.   SOCIAL HISTORY:  No tobacco or alcohol use.   REVIEW OF SYSTEMS:  Noncontributory.   PHYSICAL EXAM:  VITAL SIGNS:  Afebrile, stable vital signs.  HEENT:  Normocephalic.  Pupils equal, round, and reactive to light and  accommodation.  Extraocular movements are intact.  Sclerae and conjunctivae  are clear.  Oropharynx clear.  NECK:  Without thyromegaly.  BREASTS:  No discrete masses.  LUNGS:  Clear.  CARDIOVASCULAR:  Regular rate.  No murmurs or gallops.  ABDOMEN:  Benign.  There is an incision above the umbilicus from previous  hernia repair.  Also, subumbilical and suprapubic incisions noted and are  intact.  No masses or organomegaly.  PELVIC:  Normal external genitalia.  Vaginal mucosa is clear.  Cuff intact.  Bimanual exam is unremarkable.  RECTOVAGINAL:  Clear.  EXTREMITIES:  Trace  edema.  NEUROLOGIC:  Grossly within normal limits.   BASIC IMPRESSION:  1. Chronic pelvic pain, probably from interstitial cystitis.  2. Fibromyalgia.  3. Previous umbilical hernia repair.   PLAN:  The patient will undergo open laparoscopy with laser standby for the  evaluation of ovarian status.  The risks of surgery have been discussed  including the risk of infection, the risk of hemorrhage which could  necessitate transfusion with the risk of AIDS or hepatitis, the risk of  injury to adjacent organs including bladder, bowel, and ureters which could  require further exploratory surgery, the risk of deep vein thrombosis and  pulmonary embolus.  The patient appears to understand indicates and risks.                                               Juluis Mire, M.D.    JSM/MEDQ  D:  01/18/2004  T:  01/18/2004  Job:  [id number]

## 2011-04-06 NOTE — Op Note (Signed)
[facility]  Patient:    [name], [name]                     MRN: [id number] Proc. Date: 05/16/01 Adm. Date:  [id number] Attending:  Londell Moh                           Operative Report  PREOPERATIVE DIAGNOSIS:  Painful scar of scalp.  POSTOPERATIVE DIAGNOSIS:  Painful scar of scalp.  OPERATION PERFORMED:  Excision of scar.  SURGEON:  Dr. Jamey Ripa.  ANESTHESIA:  General.  CLINICAL HISTORY:  This patient is a 55 year old lady whose had a prior what appeared to be a fatty growth of the scalp on the occipital area near the midline several years ago. She has got some recurrent swelling there that doesnt form a well circumscribed mass and it has been quite tender to her. She is undergoing general anesthesia for bladder procedure and we elected to reexcise this area under general anesthetic.  DESCRIPTION OF PROCEDURE:  The patient was seen in the holding area and the area in question was identified by the patient and marked. She was taken to the operating room and satisfactory general anesthesia obtained. The area was prepped with some Betadine. I excised the old scar with a narrow elliptical incision. The subcutaneous tissue just looked like scar tissue with fatty tissue but I excised a modest amount of this to be sure we had appropriate biopsy and to see if just reducing the amount of scar tissue here that had previously formed would help in any way with her discomfort. Bleeders controlled with the cautery and incision closed with 4-0 Prolene. The patient tolerated the procedure well. All counts were correct. DD:  05/16/01 TD:  05/16/01 Job: [id number]

## 2011-04-18 ENCOUNTER — Other Ambulatory Visit: Payer: Self-pay | Admitting: Internal Medicine

## 2011-05-11 ENCOUNTER — Telehealth: Payer: Self-pay | Admitting: Gastroenterology

## 2011-05-11 NOTE — Telephone Encounter (Signed)
Pt states she has been having some discomfort on her left side and would like to be seen. Appt made for pt to see Willette Cluster NP 05/15/11@9 :30am. Pt aware of appt date and time.

## 2011-05-15 ENCOUNTER — Encounter: Payer: Self-pay | Admitting: Nurse Practitioner

## 2011-05-15 ENCOUNTER — Ambulatory Visit (INDEPENDENT_AMBULATORY_CARE_PROVIDER_SITE_OTHER): Payer: Medicare Other | Admitting: Nurse Practitioner

## 2011-05-15 VITALS — BP 134/76 | HR 76 | Ht 65.5 in | Wt 244.2 lb

## 2011-05-15 DIAGNOSIS — K589 Irritable bowel syndrome without diarrhea: Secondary | ICD-10-CM

## 2011-05-15 MED ORDER — RIFAXIMIN 200 MG PO TABS: ORAL_TABLET | ORAL | Status: DC

## 2011-05-15 MED ORDER — RIFAXIMIN 550 MG PO TABS: ORAL_TABLET | ORAL | Status: DC

## 2011-05-15 MED ORDER — LOPERAMIDE HCL 2 MG PO TABS: 2.0000 mg | ORAL_TABLET | Freq: Two times a day (BID) | ORAL | Status: AC

## 2011-05-15 NOTE — Patient Instructions (Addendum)
We have sent a prescription to your pharmacy for Xifaxin 200 mg. Take 2 tablets by mouth three times daily x 7 days We have sent a prescription to your pharmacy for Imodium. You should take 1 tablet by mouth twice daily. Follow up with Dr Jarold Motto in 1 month. You have been scheduled for an appointment on 06/14/11 @ 10:45 am.   Of note: Spoke with Geraldine Contras at [facility]. They will disregard order for Xifaxan 550 mg and will fill the Xifaxan 200 mg instead. Ronny Bacon, CMA 10:39 am.

## 2011-05-15 NOTE — Progress Notes (Signed)
05/15/2011 [name] [id number] Jun 25, 1956   HISTORY OF PRESENT ILLNESS: [name] is a 55 year old female with multiple medical problems including, but not limited to: obesity, diabetes, hypertension, fibromyalgia, interstitial cystitis, restless leg syndrome, arthritis and anxiety/depression. Patient has been followed by Dr. Jarold Motto for a history of chronic Hemoccult positive stools despite previous gastrointestinal workups by two other gastroenterologists. Patient also has chronic acid reflux and chronic altered bowel habits bowel associated with gas, bloating and abdominal pain.   Patient was last seen in April 2009. She presents today with a one week history of progressive left sided abdominal pain associated with increased gas and loose stool. Patient recently saw her gynecologist for an annual checkup. Apparently there was discussion of the left sided abdominal pain and the patient is scheduled for pelvic ultrasound in July.  Gynecologist gave her pain medication to take for the left lower quadrant pain. The patient wears a fentanyl patch for interstitial cystitis  In March of this year patient had an episode of severe abdominal pain / constipation and rectal bleeding  and was evaluated at [facility] emergency  department.  I reviewed records, on exam there was no gross blood. Her hemoglobin was normal. White count was normal though her absolute granulocytes are mildly increased. LFTs normal. Abdominal series revealed colonic air fluid levels. Discharged home from emergency department with diagnosis of IBS. Patient was advised to increase her Bentyl to 20mg  twice daily which she has been taking since that time .She had a colonoscopy in 2007 with [facility], I do not have those records. Sounds like she was difficult to sedate and awake throughout whole procedure.    Past Medical History  Diagnosis Date  . Diverticulosis   . Restless leg syndrome   . IBS (irritable bowel syndrome)   . IC  (interstitial cystitis)   . Hypertension   . Diabetes mellitus     diet controlled  . Depression   . Fibromyalgia   . Anxiety   . Osteopenia   . Arthritis     in knees   Past Surgical History  Procedure Date  . Total knee arthroplasty     bilateral  . Tonsillectomy and adenoidectomy   . Breast cyst excision 1978    left  . Laparotomy 1982    cyst removed from right ovary and removal of adhesions  . Abdominal exploration surgery 1983    diverticulum removed  . Appendectomy 1983    with exploration  . Laparoscopic incisional / umbilical / ventral hernia repair 1991, 2001, 2007    x 3  . Vaginal hysterectomy 1992  . Laparotomy 1991  . Scalp surgery 1993, 1998    Removal of tumor  . Nasal sinus surgery 2004  . Rectocele repair 2009  . Cervical fusion 2010    reports that she has quit smoking. She does not have any smokeless tobacco history on file. She reports that she drinks alcohol. She reports that she does not use illicit drugs. family history includes Breast cancer in her maternal grandmother, mother, and other; Diabetes in her father and maternal grandmother; and Throat cancer in her maternal grandmother.  There is no history of Colon cancer. Allergies  Allergen Reactions  . Chlorzoxazone   . Gabapentin   . Gemfibrozil   . Metronidazole   . Morphine And Related   . Nitrofurantoin   . Olmesartan Medoxomil-Hctz   . Pentosan Polysulfate Sodium   . Propoxyphene N-Acetaminophen   . Ramipril     REACTION:  cough  . Sulfonamide Derivatives       Outpatient Encounter Prescriptions as of 05/15/2011  Medication Sig Dispense Refill  . ALPRAZolam (XANAX) 1 MG tablet Take 1 mg by mouth 3 (three) times daily as needed.        Marland Kitchen amLODipine (NORVASC) 5 MG tablet Take 5 mg by mouth daily.        . citalopram (CELEXA) 40 MG tablet Take 40 mg by mouth at bedtime.        . cyclobenzaprine (FLEXERIL) 10 MG tablet Take 10 mg by mouth 3 (three) times daily as needed.        .  dicyclomine (BENTYL) 10 MG capsule Take 2-3 times daily as needed       . fentaNYL (DURAGESIC) 100 MCG/HR Place 1 patch onto the skin every other day.        . furosemide (LASIX) 20 MG tablet TAKE ONE TABLET BY MOUTH DAILY  30 tablet  3  . losartan (COZAAR) 50 MG tablet Take 50 mg by mouth daily.        Marland Kitchen NEXIUM 40 MG capsule TAKE ONE BY MOUTH AT BEDTIME  30 capsule  5  . PRISTIQ 50 MG 24 hr tablet TAKE 1 TABLET BY MOUTH DAILY  30 tablet  3  . promethazine (PHENERGAN) 25 MG tablet Take 25 mg by mouth at bedtime.        Marland Kitchen rOPINIRole (REQUIP) 4 MG tablet TAKE 1 TABLET AT BEDTIME  30 tablet  6  . traMADol (ULTRAM) 50 MG tablet TAKE 1 TABLET BY MOUTH EVERY 6 HOURS AS NEEDED FOR PAIN  40 tablet  1  . traZODone (DESYREL) 100 MG tablet Take 100 mg by mouth at bedtime.           REVIEW OF SYSTEMS  : All other systems reviewed and negative except where noted in the History of Present Illness.   PHYSICAL EXAM: General:  Obese, white female in no acute distress Head: Normocephalic and atraumatic Eyes:  sclerae anicteric,conjunctive pink. Ears: Normal auditory acuity Mouth: No deformity or lesions Neck: Supple, no masses.  Lungs: Clear throughout to auscultation Heart: Regular rate and rhythm; no murmurs heard Abdomen: Soft, non distended, mild LLQ tender. No masses or hepatomegaly noted. Normal Bowel sounds Rectal: Not done. Musculoskeletal: Symmetrical with no gross deformities  Skin: No lesions on visible extremities Extremities: No edema or deformities noted Neurological: Alert oriented x 4, grossly nonfocal Cervical Nodes:  No significant cervical adenopathy Psychological:  Alert and cooperative. Normal mood and affect  ASSESSMENT AND PLAN;

## 2011-05-15 NOTE — Assessment & Plan Note (Addendum)
Irritable bowel syndrome with left lower quadrant pain and diarrhea. At the beginning of this appointment I requested records from [facility]. Those records show that the patient's last colonoscopy was June 2007 by Dr. Madilyn Fireman. The extent of the exam was to the cecum, prep was adequate. Only finding was that of a single large diverticulum in the descending colon. Patient has had numerous upper and lower endoscopies through the years. Her current symptoms, diagnostic yield of a repeat colonoscopy would be low. Patient should continue Bentyl twice daily. She can add Imodium twice daily as needed.  Will try a course of Xifaxan after which time patient will return to Dr. Jarold Motto for reassessment.

## 2011-05-16 NOTE — Progress Notes (Signed)
Agree with initial assessment and plans 

## 2011-05-19 ENCOUNTER — Other Ambulatory Visit: Payer: Self-pay | Admitting: Internal Medicine

## 2011-05-29 ENCOUNTER — Other Ambulatory Visit: Payer: Self-pay | Admitting: Internal Medicine

## 2011-06-12 ENCOUNTER — Encounter: Payer: Self-pay | Admitting: Internal Medicine

## 2011-06-14 ENCOUNTER — Ambulatory Visit: Payer: Medicare Other | Admitting: Gastroenterology

## 2011-06-19 ENCOUNTER — Other Ambulatory Visit: Payer: Self-pay | Admitting: Internal Medicine

## 2011-06-25 ENCOUNTER — Encounter: Payer: Self-pay | Admitting: *Deleted

## 2011-06-26 ENCOUNTER — Telehealth: Payer: Self-pay | Admitting: *Deleted

## 2011-06-26 ENCOUNTER — Ambulatory Visit: Payer: Medicare Other | Admitting: Gastroenterology

## 2011-06-26 NOTE — Telephone Encounter (Signed)
Message copied by Leonette Monarch on Tue Jun 26, 2011  1:15 PM ------      Message from: Karna Christmas D      Created: Tue Jun 26, 2011 12:23 PM       Pt resch'd her appt. from today until 07-06-11 because she has a migraine and "cannot drive"

## 2011-06-26 NOTE — Telephone Encounter (Signed)
Please bill for sam day cx.

## 2011-07-06 ENCOUNTER — Ambulatory Visit: Payer: Medicare Other | Admitting: Gastroenterology

## 2011-08-03 ENCOUNTER — Other Ambulatory Visit: Payer: Self-pay | Admitting: Internal Medicine

## 2011-08-03 ENCOUNTER — Other Ambulatory Visit (INDEPENDENT_AMBULATORY_CARE_PROVIDER_SITE_OTHER): Payer: Medicare Other

## 2011-08-03 ENCOUNTER — Encounter: Payer: Self-pay | Admitting: Internal Medicine

## 2011-08-03 ENCOUNTER — Ambulatory Visit (INDEPENDENT_AMBULATORY_CARE_PROVIDER_SITE_OTHER): Payer: Medicare Other | Admitting: Internal Medicine

## 2011-08-03 DIAGNOSIS — E119 Type 2 diabetes mellitus without complications: Secondary | ICD-10-CM

## 2011-08-03 DIAGNOSIS — M67919 Unspecified disorder of synovium and tendon, unspecified shoulder: Secondary | ICD-10-CM

## 2011-08-03 DIAGNOSIS — Z23 Encounter for immunization: Secondary | ICD-10-CM

## 2011-08-03 DIAGNOSIS — M7580 Other shoulder lesions, unspecified shoulder: Secondary | ICD-10-CM

## 2011-08-03 MED ORDER — PROMETHAZINE HCL 25 MG PO TABS: 25.0000 mg | ORAL_TABLET | Freq: Every day | ORAL | Status: DC

## 2011-08-03 MED ORDER — TRAMADOL HCL 50 MG PO TABS: 50.0000 mg | ORAL_TABLET | Freq: Four times a day (QID) | ORAL | Status: DC | PRN

## 2011-08-03 MED ORDER — METFORMIN HCL ER 500 MG PO TB24: 500.0000 mg | ORAL_TABLET | Freq: Every day | ORAL | Status: DC

## 2011-08-03 MED ORDER — MELOXICAM 15 MG PO TABS: 15.0000 mg | ORAL_TABLET | Freq: Every day | ORAL | Status: DC

## 2011-08-03 MED ORDER — TRAZODONE HCL 100 MG PO TABS: 100.0000 mg | ORAL_TABLET | Freq: Every day | ORAL | Status: DC

## 2011-08-03 NOTE — Assessment & Plan Note (Signed)
Remotely on metformin - diet controlled for last few years Check now and adjust tx as needed Lab Results  Component Value Date   HGBA1C 6.7* 12/26/2010

## 2011-08-03 NOTE — Patient Instructions (Addendum)
It was good to see you today. Use meloxicam daily x 2 weeks and tramadol as needed for pain -  Refill on medication(s) as discussed today. Exercises for shoulder as well as cold pak or warmth If still unimproved, call Dr. Charlann Boxer or Supple as needed Test(s) ordered today. Your results will be called to you after review (48-72hours after test completion). If any changes need to be made, you will be notified at that time. Please schedule followup in 4 months for diabetes mellitus and blood pressure check, call sooner if problems.  Impingement Syndrome   (Rotator Cuff Tendinitis, Bursitis) with Rehab   Impingement syndrome is a condition that involves inflammation of the tendons of the rotator cuff and the subacromial bursa, that causes pain in the shoulder. The rotator cuff consists of four tendons and muscles that control much of the shoulder and upper arm function. The subacromial bursa is a fluid filled sac that helps reduce friction between the rotator cuff and one of the bones of the shoulder (acromion). Impingement syndrome is usually an overuse injury that causes  swelling of the bursa (bursitis), swelling of the tendon (tendonitis), and/or a tear of the tendon (strain). Strains are classified into three categories. Grade 1 strains cause pain, but the tendon is not lengthened. Grade 2 strains include a lengthened ligament, due to the ligament being stretched or partially ruptured. With grade 2 strains there is still function, although the function may be decreased. Grade 3 strains include a complete tear of the tendon or muscle, and function is usually impaired.   SYMPTOMS  Pain around the shoulder, often at the outer portion of the upper arm.  Pain that gets worse with shoulder function, especially when reaching overhead or lifting.  Sometimes, aching when not using the arm.  Pain that wakes you up at night.  Sometimes, tenderness, swelling, warmth, or redness over the affected area.   Loss of strength.  Limited motion of the shoulder, especially reaching behind the back (to the back pocket or to unhook bra) or across your body.  Crackling sound (crepitation) when moving the arm.  Biceps tendon pain and inflammation (in the front of the shoulder). Worse when bending the elbow or lifting.   CAUSES   Impingement syndrome is often an overuse injury, in which chronic (repetitive) motions cause the tendons or bursa to become inflamed.  A strain occurs when a force is paced on the tendon or muscle that is greater than it can withstand. Common mechanisms of injury include: Stress from sudden increase in duration, frequency, or intensity of training.  Direct hit (trauma) to the shoulder.  Aging, erosion of the tendon with normal use.  Bony bump on shoulder (acromial spur).   RISK INCREASES WITH    Contact sports (football, wrestling, boxing).  Throwing sports (baseball, tennis, volleyball).  Weightlifting and bodybuilding.  Heavy labor.  Previous injury to the rotator cuff, including impingement.  Poor shoulder strength and flexibility.  Failure to warm up properly before activity.  Inadequate protective equipment.  Old age.  Bony bump on shoulder (acromial spur).   PREVENTIVE MEASURES    Warm up and stretch properly before activity.  Allow for adequate recovery between workouts.  Maintain physical fitness: l Strength, flexibility, and endurance. l Cardiovascular fitness.  Learn and use proper exercise technique.   PROGNOSIS If treated properly, impingement syndrome usually goes away within 6 weeks. Sometimes surgery is required.     POSSIBLE COMPLICATIONS    Longer healing time if not  properly treated, or if not given enough time to heal.  Recurring symptoms, that result in a chronic condition.  Shoulder stiffness, frozen shoulder, or loss of motion.  Rotator cuff tendon tear.  Recurring symptoms, especially if activity is resumed too soon,  with overuse, with a direct blow, or when using poor technique.   GENERAL TREATMENT CONSIDERATIONS   Treatment first involves the use of ice and medicine, to reduce pain and inflammation. The use of strengthening and stretching exercises may help reduce pain with activity. These exercises may be performed at home or with a therapist. If non-surgical treatment is unsuccessful after more than 6 months, surgery may be advised. After surgery and rehabilitation, activity is usually possible in 3 months.     MEDICATION:    If pain medicine is needed, nonsteroidal anti-inflammatory medicines (aspirin and ibuprofen), or other minor pain relievers (acetaminophen), are often advised.    Do not take pain medicine for 7 days before surgery.    Prescription pain relievers may be given, if your caregiver thinks they are needed. Use only as directed and only as much as you need.  Corticosteroid injections may be given by your caregiver.  These injections should be reserved for the most serious cases, because they may only be given a certain number of times.   HEAT AND COLD:    Cold treatment (icing) should be applied for 10 to 15 minutes every 2 to 3 hours for inflammation and pain, and immediately after activity that aggravates your symptoms. Use ice packs or an ice massage.  Heat treatment may be used before performing stretching and strengthening activities prescribed by your caregiver, physical therapist, or athletic trainer. Use a heat pack or a warm water soak.     SEEK MEDICAL CARE IF:    Symptoms get worse or do not improve in 4 to 6 weeks, despite treatment.  New, unexplained symptoms develop. (Drugs used in treatment may produce side effects.)     EXERCISES   RANGE OF MOTION AND STRETCHING EXERCISES - Impingement Syndrome (Rotator Cuff  Tendinitis, Bursitis) These exercises may help you when beginning to rehabilitate your injury. Your symptoms may go away with or without further  involvement from your physician, physical therapist or athletic trainer. While completing these exercises, remember:    Restoring tissue flexibility helps normal motion to return to the joints. This allows healthier, less painful movement and activity.  An effective stretch should be held for at least 30 seconds.  A stretch should never be painful. You should only feel a gentle lengthening or release in the stretched tissue.       STRETCH - Flexion, Standing    Stand with good posture. With an underhand grip on your __________ hand, and an overhand grip on the opposite hand, grasp a broomstick or cane so that your hands are a little more than shoulder width apart.  Keeping your __________ elbow straight and shoulder muscles relaxed, push the stick with your opposite hand, to raise your __________ arm in front of your body and then overhead. Raise your arm until you feel a stretch in your __________ shoulder, but before you have increased shoulder pain.  Try to avoid shrugging your __________ shoulder as your arm rises, by keeping your shoulder blade tucked down and toward your mid-back spine. Hold for __________ seconds.  Slowly return to the starting position.   Repeat __________ times. Complete this exercise __________ times per day.     STRETCH - Abduction, Supine  Lie on your back. With an underhand grip on your __________ hand and an overhand grip on the opposite hand, grasp a broomstick or cane so that your hands are a little more than shoulder width apart.  Keeping your __________ elbow straight and your shoulder muscles relaxed, push the stick with your opposite hand, to raise your __________ arm out to the side of your body and then overhead. Raise your arm until you feel a stretch in your __________ shoulder, but before you have increased shoulder pain.  Try to avoid shrugging your __________ shoulder as your arm rises, by keeping your shoulder blade tucked down and toward your  mid-back spine. Hold for __________ seconds.  Slowly return to the starting position.   Repeat __________ times. Complete this exercise __________ times per day.       ROM - Flexion, Active-Assisted  Lie on your back. You may bend your knees for comfort.  Grasp a broomstick or cane so your hands are about shoulder width apart. Your __________ hand should grip the end of the stick, so that your hand is positioned "thumbs-up," as if you were about to shake hands.  Using your healthy arm to lead, raise your __________ arm overhead, until you feel a gentle stretch in your shoulder. Hold for __________ seconds.    Use the stick to assist in returning your __________ arm to its starting position. Repeat __________ times. Complete this exercise __________ times per day.       ROM - Internal Rotation, Supine     Lie on your back on a firm surface.  Place your __________ elbow about 60 degrees away from your side. Elevate your elbow with a folded towel, so that the elbow and shoulder are the same height.  Using a broomstick or cane and your strong arm, pull your __________ hand toward your body until you feel a gentle stretch, but no increase in your shoulder pain.  Keep your shoulder and elbow in place throughout the exercise.  Hold for __________ seconds.  Slowly return to the starting position.   Repeat __________ times.  Complete this exercise __________ times per day.     STRETCH - Internal Rotation    Place your __________ hand behind your back, palm up.  Throw a towel or belt over your opposite shoulder. Grasp the towel with your __________ hand.  While keeping an upright posture, gently pull up on the towel, until you feel a stretch in the front of your __________ shoulder.  Avoid shrugging your __________ shoulder as your arm rises, by keeping your shoulder blade tucked down and toward your mid-back spine.    Hold for __________ seconds. Release the stretch, by lowering your  healthy hand.   Repeat __________ times. Complete this exercise __________ times per day.       ROM - Internal Rotation     Using an underhand grip, grasp a stick behind your back with both hands.  While standing upright with good posture, slide the stick up your back until you feel a mild stretch in the front of your shoulder.  Hold for __________ seconds.  Slowly return to your starting position. Repeat __________ times. Complete this exercise __________ times per day.       STRETCH - Posterior Shoulder Capsule    Stand or sit with good posture.  Grasp your __________ elbow and draw it across your chest, keeping it at the same height as your shoulder.  Pull your elbow, so your upper  arm comes in closer to your chest. Pull until you feel a gentle stretch in the back of your shoulder.  Hold for __________ seconds.   Repeat __________ times. Complete this exercise __________ times per day.     STRENGTHENING EXERCISES - Impingement Syndrome (Rotator Cuff Tendinitis, Bursitis) These exercises may help you when beginning to rehabilitate your injury. They may resolve your symptoms with or without further involvement from your physician, physical therapist or athletic trainer. While completing these exercises, remember:   Muscles can gain both the endurance and the strength needed for everyday activities through controlled exercises.  Complete these exercises as instructed by your physician, physical therapist or athletic trainer. Increase the resistance and repetitions only as guided.  You may experience muscle soreness or fatigue, but the pain or discomfort you are trying to eliminate should never worsen during these exercises. If this pain does get worse, stop and make sure you are following the directions exactly. If the pain is still present after adjustments, discontinue the exercise until you can discuss the trouble with your clinician.  During your recovery, avoid activity or  exercises which involve actions that place your injured hand or elbow above your head or behind your back or head. These positions stress the tissues which you are trying to heal.    STRENGTH - Scapular Depression and Adduction   With good posture, sit on a firm chair. Support your arms in front of you, with pillows, arm rests, or on a table top. Have your elbows in line with the sides of your body.  Gently draw your shoulder blades down and toward your mid-back spine. Gradually increase the tension, without tensing the muscles along the top of your shoulders and the back of your neck.  Hold for __________ seconds. Slowly release the tension and relax your muscles completely before starting the next repetition.  After you have practiced this exercise, remove the arm support and complete the exercise in standing as well as sitting position. Repeat __________ times. Complete this exercise __________ times per day.       STRENGTH - Shoulder Abductors, Isometric    With good posture, stand or sit about 4-6 inches from a wall, with your __________ side facing the wall.    Bend your __________ elbow. Gently press your __________ elbow into the wall. Increase the pressure gradually, until you are pressing as hard as you can, without shrugging your shoulder or increasing any shoulder discomfort.  Hold for __________ seconds.  Release the tension slowly. Relax your shoulder muscles completely before you begin the next repetition.   Repeat __________ times. Complete this exercise __________ times per day.       STRENGTH - External Rotators, Isometric    Keep your __________ elbow at your side and bend it 90 degrees.  Step into a door frame so that the outside of your __________ wrist can press against the door frame without your upper arm leaving your side.  Gently press your __________ wrist into the door frame, as if you were trying to swing the back of your hand away from your stomach.  Gradually increase the tension, until you are pressing as hard as you can, without shrugging your shoulder or increasing any shoulder discomfort.  Hold for __________ seconds.    Release the tension slowly. Relax your shoulder muscles completely before you begin the next repetition.   Repeat __________ times. Complete this exercise __________ times per day.       STRENGTH -  Supraspinatus   Stand or sit with good posture. Grasp a __________ lb weight, or an exercise band or tubing, so that your hand is "thumbs-up," like you are shaking hands.  Slowly lift your __________ arm in a "V" away from your thigh, diagonally into the space between your side and straight ahead. Lift your hand to shoulder height or as far as you can, without increasing any shoulder pain.  At first, many people do not lift their hands above shoulder height.    Avoid shrugging your __________ shoulder as your arm rises, by keeping your shoulder blade tucked down and toward your mid-back spine.    Hold for __________ seconds. Control the descent of your hand, as you slowly return to your starting position.   Repeat __________ times. Complete this exercise __________ times per day.       STRENGTH - External Rotators  Secure a rubber exercise band or tubing to a fixed object (table, pole) so that it is at the same height as your __________ elbow when you are standing or sitting on a firm surface.  Stand or sit so that the secured exercise band is at your uninjured side.    Bend your __________ elbow 90 degrees. Place a folded towel or small pillow under your __________ arm, so that your elbow is a few inches away from your side.  Keeping the tension on the exercise band, pull it away from your body, as if pivoting on your elbow. Be sure to keep your body steady, so that the movement is coming only from your rotating shoulder.  Hold for __________ seconds. Release the tension in a controlled manner, as you return to the  starting position.   Repeat __________ times. Complete this exercise __________ times per day.       STRENGTH - Internal Rotators   Secure a rubber exercise band or tubing to a fixed object (table, pole) so that it is at the same height as your __________ elbow when you are standing or sitting on a firm surface.  Stand or sit so that the secured exercise band is at your __________ side.    Bend your elbow 90 degrees.  Place a folded towel or small pillow under your __________ arm so that your elbow is a few inches away from your side.  Keeping the tension on the exercise band, pull it across your body, toward your stomach. Be sure to keep your body steady, so that the movement is coming only from your rotating shoulder.  Hold for __________ seconds.  Release the tension in a controlled manner, as you return to the starting position.   Repeat __________ times. Complete this exercise __________ times per day.       STRENGTH - Scapular Protractors, Standing   Stand arms length away from a wall. Place your hands on the wall, keeping your elbows straight.  Begin by dropping your shoulder blades down and toward your mid-back spine.  To strengthen your protractors, keep your shoulder blades down, but slide them forward on your rib cage. It will feel as if you are lifting the back of your rib cage away from the wall. This is a subtle motion and can be challenging to complete. Ask your caregiver for further instruction, if you are not sure you are doing the exercise correctly.  Hold for __________ seconds. Slowly return to the starting position, resting the muscles completely before starting the next repetition. Repeat __________ times. Complete this exercise __________ times per  day.     STRENGTH - Scapular Protractors, Supine  Lie on your back on a firm surface. Extend your __________ arm straight into the air while holding a __________ pound weight in your hand.  Keeping your head and  back in place, lift your shoulder off the floor.  Hold for __________ seconds. Slowly return to the starting position, and allow your muscles to relax completely before starting the next repetition. Repeat __________ times. Complete this exercise __________ times per day.     STRENGTH - Scapular Protractors, Quadruped  Get onto your hands and knees, with your shoulders directly over your hands (or as close as you can be, comfortably).    Keeping your elbows locked, lift the back of your rib cage up into your shoulder blades, so your mid-back rounds out. Keep your neck muscles relaxed.    Hold this position for __________ seconds. Slowly return to the starting position and allow your muscles to relax completely before starting the next repetition. Repeat __________ times. Complete this exercise __________ times per day.       STRENGTH - Scapular Retractors  Secure a rubber exercise band or tubing to a fixed object (table, pole), so that it is at the height of your shoulders when you are either standing, or sitting on a firm armless chair.  With a palm down grip, grasp an end of the band in each hand. Straighten your elbows and lift your hands straight in front of you, at shoulder height. Step back, away from the secured end of the band, until it becomes tense.  Squeezing your shoulder blades together, draw your elbows back toward your sides, as you bend them. Keep your upper arms lifted away from your body throughout the exercise.  Hold for __________ seconds. Slowly ease the tension on the band, as you reverse the directions and return to the starting position.   Repeat __________ times. Complete this exercise __________ times per day.    STRENGTH - Shoulder Extensors   Secure a rubber exercise band or tubing to a fixed object (table, pole) so that it is at the height of your shoulders when you are either standing, or sitting on a firm armless chair.  With a thumbs-up grip, grasp an  end of the band in each hand. Straighten your elbows and lift your hands straight in front of you, at shoulder height. Step back, away from the secured end of the band, until it becomes tense.  Squeezing your shoulder blades together, pull your hands down to the sides of your thighs. Do not allow your hands to go behind you.    Hold for __________ seconds. Slowly ease the tension on the band, as you reverse the directions and return to the starting position.   Repeat __________ times. Complete this exercise __________ times per day.       STRENGTH - Scapular Retractors and External Rotators     Secure a rubber exercise band or tubing to a fixed object (table, pole) so that it is at the height as your shoulders, when you are either standing, or sitting on a firm armless chair.  With a palm down grip, grasp an end of the band in each hand. Bend your elbows 90 degrees and lift your elbows to shoulder height, at your sides. Step back, away from the secured end of the band, until it becomes tense.  Squeezing your shoulder blades together, rotate your shoulders so that your upper arms and elbows remain stationary, but  your fists travel upward to head height.  Hold for __________ seconds. Slowly ease the tension on the band, as you reverse the directions and return to the starting position.   Repeat __________ times. Complete this exercise __________ times per day.       STRENGTH - Scapular Retractors and External Rotators, Rowing   Secure a rubber exercise band or tubing to a fixed object (table, pole) so that it is at the height of your shoulders, when you are either standing, or sitting on a firm armless chair.  With a palm down grip, grasp an end of the band in each hand. Straighten your elbows and lift your hands straight in front of you, at shoulder height. Step back, away from the secured end of the band, until it becomes tense.  Step 1: Squeeze your shoulder blades together. Bending your  elbows, draw your hands to your chest, as if you are rowing a boat. At the end of this motion, your hands and elbow should be at shoulder height and your elbows should be out to your sides.  Step 2: Rotate your shoulders, to raise your hands above your head. Your forearms should be vertical and your upper arms should be horizontal.  Hold for __________ seconds. Slowly ease the tension on the band, as you reverse the directions and return to the starting position.   Repeat __________ times. Complete this exercise __________ times per day.       STRENGTH - Scapular Depressors  Find a sturdy chair without wheels, such as a dining room chair.  Keeping your feet on the floor, and your hands on the chair arms, lift your bottom up from the seat, and lock your elbows.  Keeping your elbows straight, allow gravity to pull your body weight down. Your shoulders will rise toward your ears.  Raise your body against gravity by drawing your shoulder blades down your back, shortening the distance between your shoulders and ears.  Although your feet should always maintain contact with the floor, your feet should progressively support less body weight, as you get stronger.    Hold for __________ seconds. In a controlled and slow manner, lower your body weight to begin the next repetition. Repeat __________ times. Complete this exercise __________ times per day.     Document Released: 11/05/2005  Document Re-Released: 09/02/2009 [facility] Patient Information 2011 [place], [place].

## 2011-08-03 NOTE — Progress Notes (Signed)
Addended by: Deatra James on: 08/03/2011 11:08 AM   Modules accepted: Orders

## 2011-08-03 NOTE — Progress Notes (Signed)
Subjective:    Patient ID: [name], female    DOB: [birth year], 55 y.o.   MRN: [id number]  HPI  complains of left shoulder pain  Onset 2 weeks ago - denies precipitating injury or overuse Pain worse with overhead effort or lying on L side No weakness or radiation of pain  Also reviewed chronic medical issues  DM2, diet controlled  - prev on metformin but reports cbg improved following weight loss -   HTN - no CP, no SOB, no edema - reports compliance with ongoing medical treatment and no changes in medication dose or frequency. denies adverse side effects related to current therapy.     dyslipidemia - controls with weight loss and lovaza - denies rx for statin   s/p B TKA with dr. Charlann Boxer due to adv OA and chronic knee pain/swelling -07/31/10 left, oct 2011 for right hosp and rehab course reviewed - home since 09/24/10 and HHPT - improving slow requests tramadol to use with narc    depression - prev doing well with pristique but tried change to generic effexor for better insurance coverage - caused sedation and sweating - changed back - now overall feeling better and has lost weight on current tx - no tearfulness or sadness - uses with celexa and trazodone - following with psyc   RLS - has been on requip 4mg  for symptoms - reports compliance with ongoing medical treatment and no changes in medication dose or frequency. denies adverse side effects related to current therapy.    Past Medical History  Diagnosis Date  . Diverticulosis   . Restless leg syndrome   . IBS (irritable bowel syndrome)   . IC (interstitial cystitis)   . Hypertension   . Diabetes mellitus     diet controlled  . Depression   . Fibromyalgia   . Anxiety   . Osteopenia   . Arthritis     in knees  . Dyslipidemia   . Chronic fatigue syndrome   . Chronic abdominal pain   . Esophageal reflux     Review of Systems  Constitutional: Negative for fever and unexpected weight change.  HENT: Negative for  facial swelling and neck pain.   Neurological: Negative for seizures.       Objective:   Physical Exam  BP 146/82  Pulse 84  Temp(Src) 98.5 F (36.9 C) (Oral)  Ht 5' 5.5" (1.664 m)  Wt 251 lb (113.853 kg)  BMI 41.13 kg/m2  SpO2 97% Wt Readings from Last 3 Encounters:  08/03/11 251 lb (113.853 kg)  05/15/11 244 lb 3.2 oz (110.768 kg)  12/26/10 234 lb 12.8 oz (106.505 kg)   Constitutional: She is overweight. She appears well-developed and well-nourished. No distress.  Neck: Normal range of motion. Neck supple. No JVD present. No thyromegaly present.  Cardiovascular: Normal rate, regular rhythm and normal heart sounds.  No murmur heard. No BLE edema. Pulmonary/Chest: Effort normal and breath sounds normal. No respiratory distress. She has no wheezes. Musculoskeletal: L shoulder decreased range of motion on forward flexion, abduction, and internal rotation. Positive impingement signs. Decreased strength with stressing of rotator cuff. Pain with crossed arm adduction. referred pain into distal deltoid. Tender over a.c. joint and subacromial. Psychiatric: She has a normal mood and affect. Her behavior is normal. Judgment and thought content normal.   Lab Results  Component Value Date   WBC 4.5 08/31/2010   HGB 8.8* 08/31/2010   HCT 26.1* 08/31/2010   PLT 150 08/31/2010   CHOL  178 12/26/2010   TRIG 165.0* 12/26/2010   HDL 46.30 12/26/2010   LDLDIRECT 136.5 10/11/2009   ALT 11 08/21/2010   AST 17 08/21/2010   NA 137 08/31/2010   K 4.1 08/31/2010   CL 102 08/31/2010   CREATININE 0.87 08/31/2010   BUN 8 08/31/2010   CO2 31 08/31/2010   TSH 2.05 10/11/2009   INR 1.72* 09/01/2010   HGBA1C 6.7* 12/26/2010   MICROALBUR 0.5 10/11/2009       Assessment & Plan:  L shoulder pain - consistent with impingement syndrome (RTC tendonitis/bursisits) - avoid steroids due to DM hx - recommended resuming daily NSAIDs (meloxicam) x 2 weeks (erx done) and ok to use tramadol prn (refill provided) - if  still unimproved, to follow up with ortho as before   Also See problem list. Medications and labs reviewed today.

## 2011-08-08 ENCOUNTER — Encounter: Payer: Self-pay | Admitting: Gastroenterology

## 2011-08-08 ENCOUNTER — Ambulatory Visit (INDEPENDENT_AMBULATORY_CARE_PROVIDER_SITE_OTHER): Payer: Medicare Other | Admitting: Gastroenterology

## 2011-08-08 DIAGNOSIS — R195 Other fecal abnormalities: Secondary | ICD-10-CM

## 2011-08-08 DIAGNOSIS — K589 Irritable bowel syndrome without diarrhea: Secondary | ICD-10-CM

## 2011-08-08 MED ORDER — PEG-KCL-NACL-NASULF-NA ASC-C 100 G PO SOLR: 1.0000 | Freq: Once | ORAL | Status: DC

## 2011-08-08 NOTE — Patient Instructions (Signed)
Your Colonoscopy is scheduled on 08/14/2011 at 3pm Separate instructions given You can pick up your MoviPrep from your pharmacy today

## 2011-08-08 NOTE — Assessment & Plan Note (Signed)
Plan repeat colonoscopy

## 2011-08-08 NOTE — Progress Notes (Signed)
History of Present Illness:  [name] has returned for followup of her diarrhea. Cholesyramine did not help her symptoms.  In fact, she developed mucousy stools  She continues to complain of severe urgency with occasional incontinence. Recent Hemoccults at her gynecologists office were positive.    Review of Systems: Pertinent positive and negative review of systems were noted in the above HPI section. All other review of systems were otherwise negative.    Current Medications, Allergies, Past Medical History, Past Surgical History, Family History and Social History were reviewed in [employer] electronic medical record  Vital signs were reviewed in today's medical record. Physical Exam: General: Well developed , well nourished, no acute distress

## 2011-08-08 NOTE — Assessment & Plan Note (Addendum)
The patient has diarrhea predominant irritable bowel syndrome. She will consider enrollment in an IBS trial.  Failing this she will undergo colonoscopy with random biopsies to r/o microscopic colitis

## 2011-08-10 ENCOUNTER — Telehealth: Payer: Self-pay

## 2011-08-10 DIAGNOSIS — G2581 Restless legs syndrome: Secondary | ICD-10-CM

## 2011-08-10 DIAGNOSIS — G4733 Obstructive sleep apnea (adult) (pediatric): Secondary | ICD-10-CM

## 2011-08-10 NOTE — Telephone Encounter (Signed)
Pt called requesting Rx for new CPAP machine. Pt advised that VAL may refer to Pulmo because documentation of O2 saturations. Pt is agreeable to referral if necessary.

## 2011-08-13 NOTE — Telephone Encounter (Signed)
Yes - will refer to pulm for eval and tx of OSA and CPAP/equip needs

## 2011-08-13 NOTE — Telephone Encounter (Signed)
Notified pt with md response...08/13/11@12 :10pm/LMB

## 2011-08-14 ENCOUNTER — Encounter: Payer: Self-pay | Admitting: Gastroenterology

## 2011-08-14 ENCOUNTER — Ambulatory Visit (AMBULATORY_SURGERY_CENTER): Payer: Medicare Other | Admitting: Gastroenterology

## 2011-08-14 DIAGNOSIS — K921 Melena: Secondary | ICD-10-CM

## 2011-08-14 DIAGNOSIS — R197 Diarrhea, unspecified: Secondary | ICD-10-CM

## 2011-08-14 DIAGNOSIS — K589 Irritable bowel syndrome without diarrhea: Secondary | ICD-10-CM

## 2011-08-14 DIAGNOSIS — R195 Other fecal abnormalities: Secondary | ICD-10-CM

## 2011-08-14 LAB — GLUCOSE, CAPILLARY: Glucose-Capillary: 117 mg/dL — ABNORMAL HIGH (ref 70–99)

## 2011-08-14 MED ORDER — SODIUM CHLORIDE 0.9 % IV SOLN: 500.0000 mL | INTRAVENOUS | Status: DC

## 2011-08-14 NOTE — Progress Notes (Signed)
Pt was sedated with propofol by Brennan Bailey, CRNA.  MAW  Pt tolerated the colonoscopy very well. Maw  1600- c/o abd cramping as a "7"  Knees to chest, pt able to pass air  1610- HOB down, knees to chest to help pt pass air, rates abd pain as a "6" now.  States some relief from discomfort after air passed  1625  No further c/o pain Burnice Logan RN

## 2011-08-14 NOTE — Patient Instructions (Signed)
Please review discharge instructions (blue and green sheets)  Await biopsy results  Call office in the next 1-3 days to schedule a follow up visit for 3 weeks  Hemoccults in 5-7- days  Resume your normal medications

## 2011-08-15 ENCOUNTER — Telehealth: Payer: Self-pay | Admitting: *Deleted

## 2011-08-15 NOTE — Telephone Encounter (Signed)
Left message to call if needed. 

## 2011-08-21 ENCOUNTER — Other Ambulatory Visit: Payer: Self-pay | Admitting: Internal Medicine

## 2011-08-22 LAB — CBC
HCT: 34.4 — ABNORMAL LOW
Hemoglobin: 11.4 — ABNORMAL LOW
MCV: 88.2
WBC: 9.9

## 2011-08-22 LAB — GLUCOSE, CAPILLARY: Glucose-Capillary: 161 — ABNORMAL HIGH

## 2011-08-27 ENCOUNTER — Encounter: Payer: Self-pay | Admitting: Pulmonary Disease

## 2011-08-27 ENCOUNTER — Ambulatory Visit (INDEPENDENT_AMBULATORY_CARE_PROVIDER_SITE_OTHER): Payer: Medicare Other | Admitting: Pulmonary Disease

## 2011-08-27 VITALS — BP 132/80 | HR 74 | Temp 98.1°F | Ht 65.0 in | Wt 254.8 lb

## 2011-08-27 DIAGNOSIS — G4733 Obstructive sleep apnea (adult) (pediatric): Secondary | ICD-10-CM | POA: Insufficient documentation

## 2011-08-27 NOTE — Progress Notes (Signed)
  Subjective:    Patient ID: [name], female    DOB: June 25, 1956, 55 y.o.   MRN: [id number]  HPI The patient is a 55 year old female who I've been asked to see for management of obstructive sleep apnea.  The patient was diagnosed greater than 5 years ago, and her sleep study is currently not available for review.  She was started on CPAP, and has done very well with the device.  She saw significant improvement in her sleep and daytime alertness.  She continued on CPAP until many weeks ago, when she noted mold inside of her machine.  She has not been able to clean this, and therefore discontinued its use.  She now has frequent awakenings at night, nonrestorative sleep, and increased daytime sleepiness.  The patient states that her weight is down from her original study, but she has gained 20 pounds this year.  Her Epworth score today is 8  Sleep Questionnaire: What time do you typically go to bed?( Between what hours) 10-11pm How long does it take you to fall asleep? 30 minutes How many times during the night do you wake up? 5 What time do you get out of bed to start your day? 1100 Do you drive or operate heavy machinery in your occupation? No How much has your weight changed (up or down) over the past two years? (In pounds) 20 lb (9.072 kg) Have you ever had a sleep study before? Yes If yes, location of study? [place] If yes, date of study? 2000 Do you currently use CPAP? Yes If so, what pressure? 8 Do you wear oxygen at any time? No     Review of Systems  Constitutional: Negative for fever and unexpected weight change.  HENT: Positive for ear pain and congestion. Negative for nosebleeds, sore throat, rhinorrhea, sneezing, trouble swallowing, dental problem, postnasal drip and sinus pressure.   Eyes: Negative for redness and itching.  Respiratory: Negative for cough, chest tightness, shortness of breath and wheezing.   Cardiovascular: Negative for palpitations and leg swelling.    Gastrointestinal: Negative for nausea and vomiting.  Genitourinary: Negative for dysuria.  Musculoskeletal: Positive for joint swelling.  Skin: Negative for rash.  Neurological: Negative for headaches.  Hematological: Does not bruise/bleed easily.  Psychiatric/Behavioral: Positive for dysphoric mood. The patient is nervous/anxious.        Objective:   Physical Exam Constitutional: obese female, no acute distress  HENT:  Nares patent without discharge, deviated septum to left with narrowing.   Oropharynx without exudate, uvula barely visible, elongation of soft palate.   Eyes:  Perrla, eomi, no scleral icterus  Neck:  No JVD, no TMG  Cardiovascular:  Normal rate, regular rhythm, no rubs or gallops.  No murmurs        Intact distal pulses  Pulmonary :  Normal breath sounds, no stridor or respiratory distress   No rales, rhonchi, or wheezing  Abdominal:  Soft, nondistended, bowel sounds present.  No tenderness noted.   Musculoskeletal:  No lower extremity edema noted.  Lymph Nodes:  No cervical lymphadenopathy noted  Skin:  No cyanosis noted  Neurologic:  Alert, appropriate, moves all 4 extremities without obvious deficit.         Assessment & Plan:

## 2011-08-27 NOTE — Assessment & Plan Note (Addendum)
The patient has a history of obstructive sleep apnea, for which she has been treated with CPAP.  She has done very well with the device until most recently, and has been having issues with mold in her device.  She is due for a replacement machine, and we'll take this opportunity to reestablish her current pressure needs.  I have also encouraged her to work aggressively on weight loss.

## 2011-08-27 NOTE — Patient Instructions (Signed)
Will get you a new cpap machine, and re-optimize pressure for you.  Will call you with results.  Work on weight loss If doing well with your new setup, will see you back in one year.

## 2011-09-03 LAB — POCT I-STAT 4, (NA,K, GLUC, HGB,HCT)
Glucose, Bld: 149 — ABNORMAL HIGH
HCT: 38
Hemoglobin: 12.9
Potassium: 4.1

## 2011-09-05 ENCOUNTER — Other Ambulatory Visit: Payer: Self-pay | Admitting: Internal Medicine

## 2011-09-09 DIAGNOSIS — F411 Generalized anxiety disorder: Secondary | ICD-10-CM | POA: Insufficient documentation

## 2011-09-11 ENCOUNTER — Ambulatory Visit: Payer: Medicare Other | Admitting: Gastroenterology

## 2011-09-19 ENCOUNTER — Other Ambulatory Visit: Payer: Self-pay | Admitting: Internal Medicine

## 2011-10-22 ENCOUNTER — Other Ambulatory Visit: Payer: Self-pay | Admitting: Internal Medicine

## 2011-10-28 ENCOUNTER — Other Ambulatory Visit: Payer: Self-pay | Admitting: Pulmonary Disease

## 2011-10-28 DIAGNOSIS — G4733 Obstructive sleep apnea (adult) (pediatric): Secondary | ICD-10-CM

## 2011-11-04 ENCOUNTER — Other Ambulatory Visit: Payer: Self-pay | Admitting: Internal Medicine

## 2011-12-11 ENCOUNTER — Telehealth: Payer: Self-pay | Admitting: Gastroenterology

## 2011-12-11 MED ORDER — DICYCLOMINE HCL 20 MG PO TABS: 20.0000 mg | ORAL_TABLET | Freq: Two times a day (BID) | ORAL | Status: DC | PRN

## 2011-12-11 NOTE — Telephone Encounter (Signed)
Dicyclomine 20mg   1-2 every day as needed  [facility]    Dr Arlyce Dice, I sent this rx in for this pt. She has an appointment with you in Feb. Just an FYI

## 2011-12-24 ENCOUNTER — Other Ambulatory Visit: Payer: Self-pay | Admitting: *Deleted

## 2011-12-24 MED ORDER — LOSARTAN POTASSIUM 50 MG PO TABS: 50.0000 mg | ORAL_TABLET | Freq: Every day | ORAL | Status: DC

## 2011-12-24 NOTE — Telephone Encounter (Signed)
Receive fax stating [facility]. Needing refills sent there on her losartan... 12/24/11@10 :25am/LMB

## 2011-12-25 ENCOUNTER — Ambulatory Visit: Payer: Medicare Other | Admitting: Gastroenterology

## 2011-12-28 ENCOUNTER — Emergency Department (INDEPENDENT_AMBULATORY_CARE_PROVIDER_SITE_OTHER): Payer: Medicare Other

## 2011-12-28 ENCOUNTER — Emergency Department (HOSPITAL_BASED_OUTPATIENT_CLINIC_OR_DEPARTMENT_OTHER)
Admission: EM | Admit: 2011-12-28 | Discharge: 2011-12-28 | Disposition: A | Discharge status: Home / Self Care | Payer: Medicare Other | Attending: Emergency Medicine | Admitting: Emergency Medicine

## 2011-12-28 ENCOUNTER — Encounter (HOSPITAL_BASED_OUTPATIENT_CLINIC_OR_DEPARTMENT_OTHER): Payer: Self-pay | Admitting: *Deleted

## 2011-12-28 DIAGNOSIS — K589 Irritable bowel syndrome without diarrhea: Secondary | ICD-10-CM | POA: Insufficient documentation

## 2011-12-28 DIAGNOSIS — IMO0001 Reserved for inherently not codable concepts without codable children: Secondary | ICD-10-CM | POA: Insufficient documentation

## 2011-12-28 DIAGNOSIS — I10 Essential (primary) hypertension: Secondary | ICD-10-CM | POA: Insufficient documentation

## 2011-12-28 DIAGNOSIS — E785 Hyperlipidemia, unspecified: Secondary | ICD-10-CM | POA: Insufficient documentation

## 2011-12-28 DIAGNOSIS — M25569 Pain in unspecified knee: Secondary | ICD-10-CM | POA: Insufficient documentation

## 2011-12-28 DIAGNOSIS — W19XXXA Unspecified fall, initial encounter: Secondary | ICD-10-CM

## 2011-12-28 DIAGNOSIS — Z79899 Other long term (current) drug therapy: Secondary | ICD-10-CM | POA: Insufficient documentation

## 2011-12-28 DIAGNOSIS — E119 Type 2 diabetes mellitus without complications: Secondary | ICD-10-CM | POA: Insufficient documentation

## 2011-12-28 MED ORDER — FENTANYL CITRATE 0.05 MG/ML IJ SOLN
INTRAMUSCULAR | Status: AC
  Filled 2011-12-28: qty 2

## 2011-12-28 MED ORDER — FENTANYL CITRATE 0.05 MG/ML IJ SOLN
50.0000 ug | Freq: Once | INTRAMUSCULAR | Status: AC
  Administered 2011-12-28: 50 ug via INTRAMUSCULAR

## 2011-12-28 NOTE — ED Notes (Signed)
Pt states fall in bathtub x 2 days ago

## 2011-12-28 NOTE — ED Provider Notes (Signed)
History     CSN: [id number]  Arrival date & time 12/28/11  1652   First MD Initiated Contact with Patient 12/28/11 1835      Chief Complaint  Patient presents with  . Knee Pain    (Consider location/radiation/quality/duration/timing/severity/associated sxs/prior treatment) Patient is a 56 y.o. female presenting with knee pain. The history is provided by the patient. No language interpreter was used.  Knee Pain This is a new problem. The current episode started yesterday. The problem occurs constantly. The problem has been gradually worsening. Associated symptoms include myalgias. The symptoms are aggravated by bending. She has tried oral narcotics for the symptoms. The treatment provided no relief.  Pt is on fentynl and tramadol for pain.  Pt reports she twisted knee in bathtub.  Pt complains of swelling and pain.  Pt reports she has had a  Knee replacement  Past Medical History  Diagnosis Date  . Diverticulosis   . Restless leg syndrome   . IBS (irritable bowel syndrome)   . IC (interstitial cystitis)   . Hypertension   . Diabetes mellitus     diet controlled  . Depression   . Fibromyalgia   . Anxiety   . Osteopenia   . Arthritis     in knees  . Dyslipidemia   . Chronic fatigue syndrome   . Chronic abdominal pain   . Esophageal reflux     Past Surgical History  Procedure Date  . Total knee arthroplasty     bilateral  . Tonsillectomy and adenoidectomy   . Breast cyst excision 1978    left  . Laparotomy 1982    cyst removed from right ovary and removal of adhesions  . Abdominal exploration surgery 1983    diverticulum removed  . Appendectomy 1983    with exploration  . Laparoscopic incisional / umbilical / ventral hernia repair 1991, 2001, 2007    x 3  . Vaginal hysterectomy 1992  . Laparotomy 1991  . Scalp surgery 1993, 1998    Removal of tumor  . Nasal sinus surgery 2004  . Rectocele repair 2009  . Cervical fusion 2010    Family History  Problem  Relation Age of Onset  . Breast cancer Mother   . Breast cancer Maternal Grandmother   . Throat cancer Maternal Grandmother   . Esophageal cancer Maternal Grandmother   . Breast cancer Other     maternal great aunts x 5  . Colon cancer Neg Hx   . Diabetes Father   . Diabetes Maternal Grandfather     History  Substance Use Topics  . Smoking status: Former Smoker -- 0.1 packs/day for 1 years    Types: Cigarettes  . Smokeless tobacco: Not on file   Comment:  2-3 cigs a day as a teenager  . Alcohol Use: Yes     2 drinks per month    OB History    Grav Para Term Preterm Abortions TAB SAB Ect Mult Living                  Review of Systems  Musculoskeletal: Positive for myalgias.  All other systems reviewed and are negative.    Allergies  Chlorzoxazone; Gabapentin; Gemfibrozil; Metronidazole; Morphine and related; Nitrofurantoin; Olmesartan medoxomil-hctz; Pentosan polysulfate sodium; Propoxyphene n-acetaminophen; Ramipril; and Sulfonamide derivatives  Home Medications   Current Outpatient Rx  Name Route Sig Dispense Refill  . ALPRAZOLAM 1 MG PO TABS Oral Take 1 mg by mouth 3 (three) times daily as needed.      Marland Kitchen  AMLODIPINE BESYLATE 5 MG PO TABS  TAKE 1 TABLET EVERY DAY 30 tablet 5  . CITALOPRAM HYDROBROMIDE 40 MG PO TABS  TAKE ONE BY MOUTH AT BEDTIME 30 tablet 5  . CYCLOBENZAPRINE HCL 10 MG PO TABS Oral Take 10 mg by mouth 3 (three) times daily as needed.      Marland Kitchen DICYCLOMINE HCL 20 MG PO TABS Oral Take 1 tablet (20 mg total) by mouth 2 (two) times daily as needed. 60 tablet 1  . ESTRADIOL 0.025 MG/24HR TD PTWK Transdermal Place 1 patch onto the skin 2 (two) times a week.    . FENTANYL 100 MCG/HR TD PT72  Place 1 patch onto the skin every 2 days    . FUROSEMIDE 20 MG PO TABS  TAKE ONE TABLET BY MOUTH DAILY 30 tablet 5  . LOSARTAN POTASSIUM 50 MG PO TABS Oral Take 1 tablet (50 mg total) by mouth daily. 30 tablet 5  . MELOXICAM 15 MG PO TABS Oral Take 1 tablet (15 mg total) by  mouth daily. 30 tablet 2  . METFORMIN HCL ER 500 MG PO TB24 Oral Take 1 tablet (500 mg total) by mouth daily with breakfast. 30 tablet 11  . NEXIUM 40 MG PO CPDR  TAKE ONE BY MOUTH AT BEDTIME 30 capsule 5  . PRISTIQ 50 MG PO TB24  TAKE 1 TABLET BY MOUTH DAILY 30 tablet 3  . PROMETHAZINE HCL 25 MG PO TABS Oral Take 1 tablet (25 mg total) by mouth at bedtime. 30 tablet 1  . ROPINIROLE HCL 4 MG PO TABS  TAKE 1 TABLET AT BEDTIME 30 tablet 5  . TRAMADOL HCL 50 MG PO TABS Oral Take 1 tablet (50 mg total) by mouth every 6 (six) hours as needed for pain. 40 tablet 1  . TRAZODONE HCL 100 MG PO TABS Oral Take 1 tablet (100 mg total) by mouth at bedtime. 30 tablet 2    BP 130/96  Pulse 85  Temp(Src) 98.6 F (37 C) (Oral)  Resp 16  Ht 5\' 5"  (1.651 m)  Wt 260 lb (117.935 kg)  BMI 43.27 kg/m2  SpO2 100%  Physical Exam  Nursing note and vitals reviewed. Constitutional: She is oriented to person, place, and time. She appears well-developed and well-nourished.  HENT:  Head: Normocephalic.  Musculoskeletal: She exhibits tenderness.       Healed incision right knee,  No effusion  Neurological: She is alert and oriented to person, place, and time.  Skin: Skin is warm and dry.  Psychiatric: She has a normal mood and affect.    ED Course  Procedures (including critical care time)  Labs Reviewed - No data to display No results found.   No diagnosis found.   Results for orders placed in visit on 08/14/11  GLUCOSE, CAPILLARY      Component Value Range   Glucose-Capillary 117 (*) 70 - 99 (mg/dL)   Comment 1 Documented in Chart    GLUCOSE, CAPILLARY      Component Value Range   Glucose-Capillary 108 (*) 70 - 99 (mg/dL)   Dg Knee Complete 4 Views Right  12/28/2011  *RADIOLOGY REPORT*  Clinical Data: Knee pain post fall  RIGHT KNEE - COMPLETE 4+ VIEW  Comparison: None.  Findings: Four views of the right knee submitted.  No acute fracture or subluxation.  There is right knee prosthesis anatomic  alignment.  The prosthesis material appears intact.  IMPRESSION: No acute fracture or subluxation.  Right knee prosthesis in anatomic alignment.  Original Report Authenticated By: Natasha Mead, M.D.    MDM  Pt advised to see Dr. Charlann Boxer orthopaedist for recheck of knee.  Pt in on multi medications,  Fentynl, xanax and tramadol.  (Pt denies pain management contract,  i advised her to discuss medications with her Md        Langston Masker, [place] 12/28/11 2040  Langston Masker, [place] 12/28/11 2051

## 2011-12-29 NOTE — ED Provider Notes (Signed)
Medical screening examination/treatment/procedure(s) were performed by non-physician practitioner and as supervising physician I was immediately available for consultation/collaboration.   Lenzy Kerschner, MD 12/29/11 0812 

## 2012-01-25 ENCOUNTER — Ambulatory Visit: Payer: Medicare Other | Admitting: Gastroenterology

## 2012-02-11 ENCOUNTER — Encounter: Payer: Self-pay | Admitting: Internal Medicine

## 2012-02-11 ENCOUNTER — Other Ambulatory Visit (INDEPENDENT_AMBULATORY_CARE_PROVIDER_SITE_OTHER): Payer: Medicare Other

## 2012-02-11 ENCOUNTER — Ambulatory Visit (INDEPENDENT_AMBULATORY_CARE_PROVIDER_SITE_OTHER): Payer: Medicare Other | Admitting: Internal Medicine

## 2012-02-11 VITALS — BP 118/78 | HR 85 | Temp 98.8°F | Ht 66.0 in | Wt 256.8 lb

## 2012-02-11 DIAGNOSIS — E119 Type 2 diabetes mellitus without complications: Secondary | ICD-10-CM

## 2012-02-11 DIAGNOSIS — E785 Hyperlipidemia, unspecified: Secondary | ICD-10-CM

## 2012-02-11 DIAGNOSIS — J309 Allergic rhinitis, unspecified: Secondary | ICD-10-CM

## 2012-02-11 LAB — LIPID PANEL
HDL: 52 mg/dL (ref >39.00)
Total CHOL/HDL Ratio: 4
Triglycerides: 114 mg/dL (ref 0.0–149.0)

## 2012-02-11 LAB — HEMOGLOBIN A1C: Hgb A1c MFr Bld: 7.7 % — ABNORMAL HIGH (ref 4.6–6.5)

## 2012-02-11 MED ORDER — FLUTICASONE PROPIONATE 50 MCG/ACT NA SUSP: 2.0000 | Freq: Every day | NASAL | Status: DC

## 2012-02-11 MED ORDER — GLUCOSE BLOOD VI STRP: ORAL_STRIP | Status: DC

## 2012-02-11 MED ORDER — ONETOUCH LANCETS MISC: Status: DC

## 2012-02-11 NOTE — Progress Notes (Signed)
Subjective:    Patient ID: [name], female    DOB: [birth year], 56 y.o.   MRN: [id number]  HPI  Here for follow up - reviewed chronic medical issues  DM2, previously diet controlled  - resumed metformin fall 2012 -also reports cbg improved following weight loss -   HTN - on ARB, amlodipine reports compliance with ongoing medical treatment and no changes in medication dose or frequency. denies adverse side effects related to current therapy.     dyslipidemia - controls with weight loss and lovaza - denies rx for statin but "intolerance" to other medications such as welchol and niacin   s/p B TKA with dr. Charlann Boxer due to adv OA and chronic knee pain/swelling: 07/31/10 left, oct 2011 for right hosp and rehab course reviewed - home since 09/24/10 and HHPT - improving slowly - uses tramadol in additional to narcotics for chronic pain   depression - psyc hosp at [facility] 3/13 following SI/attempt - med changes reviewed - gen doing well with pristiq, intol of generic effexor (sedation and sweating), new Risperdal  3/13- now overall feeling better - also increase trazodone - following with psyc   RLS - has been on requip 4mg  for symptoms - reports compliance with ongoing medical treatment and no changes in medication dose or frequency. denies adverse side effects related to current therapy.    Past Medical History  Diagnosis Date  . Diverticulosis   . Restless leg syndrome   . IBS (irritable bowel syndrome)   . IC (interstitial cystitis)   . Hypertension   . Diabetes mellitus     diet controlled  . Depression   . Fibromyalgia   . Anxiety   . Osteopenia   . Arthritis     in knees  . Dyslipidemia   . Chronic fatigue syndrome   . Chronic abdominal pain   . Esophageal reflux     Review of Systems  Constitutional: Negative for fever and unexpected weight change.  HENT: Negative for facial swelling and neck pain.   Neurological: Negative for seizures.       Objective:   Physical  Exam   BP 118/78  Pulse 85  Temp(Src) 98.8 F (37.1 C) (Oral)  Ht 5\' 6"  (1.676 m)  Wt 256 lb 12.8 oz (116.484 kg)  BMI 41.45 kg/m2  SpO2 97% Wt Readings from Last 3 Encounters:  02/11/12 256 lb 12.8 oz (116.484 kg)  12/28/11 260 lb (117.935 kg)  08/27/11 254 lb 12.8 oz (115.577 kg)   Constitutional: [name] is overweight. [name] appears well-developed and well-nourished. No distress.  Neck: Normal range of motion. Neck supple. No JVD present. No thyromegaly present.  Cardiovascular: Normal rate, regular rhythm and normal heart sounds.  No murmur heard. No BLE edema. Pulmonary/Chest: Effort normal and breath sounds normal. No respiratory distress. [name] has no wheezes. Psychiatric: [name] has a normal mood and affect. Her behavior is normal. Judgment and thought content normal.   Lab Results  Component Value Date   WBC 4.5 08/31/2010   HGB 8.8* 08/31/2010   HCT 26.1* 08/31/2010   PLT 150 08/31/2010   CHOL 178 12/26/2010   TRIG 165.0* 12/26/2010   HDL 46.30 12/26/2010   LDLDIRECT 136.5 10/11/2009   ALT 11 08/21/2010   AST 17 08/21/2010   NA 137 08/31/2010   K 4.1 08/31/2010   CL 102 08/31/2010   CREATININE 0.87 08/31/2010   BUN 8 08/31/2010   CO2 31 08/31/2010   TSH 2.05 10/11/2009  INR 1.72* 09/01/2010   HGBA1C 7.1* 08/03/2011   MICROALBUR 0.5 10/11/2009       Assessment & Plan:   Also See problem list. Medications and labs reviewed today.

## 2012-02-11 NOTE — Assessment & Plan Note (Signed)
Intol of med trials for same - recheck now, esp in setting of DM

## 2012-02-11 NOTE — Assessment & Plan Note (Signed)
Started nasal steroid - symptoms improved The current medical regimen is effective;  continue present plan and medications.

## 2012-02-11 NOTE — Patient Instructions (Signed)
It was good to see you today. Medications reviewed, no changes at this time. Updates as reviewed - renew the Flonase for allergy control Test(s) ordered today. Your results will be called to you after review (48-72hours after test completion). If any changes need to be made, you will be notified at that time. Please schedule followup in 4 months for diabetes mellitus and blood pressure check, call sooner if problems.

## 2012-02-11 NOTE — Assessment & Plan Note (Signed)
Remote metformin, then diet controlled -  Resumed metformin fall 2012 - Check now and adjust tx as needed Also on ARB Lab Results  Component Value Date   HGBA1C 7.1* 08/03/2011

## 2012-02-12 ENCOUNTER — Other Ambulatory Visit: Payer: Self-pay | Admitting: Internal Medicine

## 2012-02-12 MED ORDER — METFORMIN HCL ER 500 MG PO TB24: 1000.0000 mg | ORAL_TABLET | Freq: Every day | ORAL | Status: DC

## 2012-03-28 ENCOUNTER — Encounter: Payer: Self-pay | Admitting: Internal Medicine

## 2012-03-28 ENCOUNTER — Other Ambulatory Visit: Payer: Self-pay | Admitting: Internal Medicine

## 2012-03-28 ENCOUNTER — Ambulatory Visit (INDEPENDENT_AMBULATORY_CARE_PROVIDER_SITE_OTHER): Payer: Medicare Other | Admitting: Internal Medicine

## 2012-03-28 VITALS — BP 102/72 | HR 84 | Temp 98.3°F | Ht 65.5 in | Wt 254.0 lb

## 2012-03-28 DIAGNOSIS — N3010 Interstitial cystitis (chronic) without hematuria: Secondary | ICD-10-CM

## 2012-03-28 DIAGNOSIS — B029 Zoster without complications: Secondary | ICD-10-CM

## 2012-03-28 MED ORDER — VALACYCLOVIR HCL 1 G PO TABS: 1000.0000 mg | ORAL_TABLET | Freq: Three times a day (TID) | ORAL | Status: DC

## 2012-03-28 MED ORDER — TRIAMCINOLONE ACETONIDE 0.1 % EX CREA: TOPICAL_CREAM | Freq: Two times a day (BID) | CUTANEOUS | Status: DC

## 2012-03-28 NOTE — Progress Notes (Signed)
  Subjective:    Patient ID: [name], female    DOB: Oct 22, 1956, 56 y.o.   MRN: [id number]  Rash This is a new problem. The current episode started yesterday. The problem has been gradually worsening since onset. The affected locations include the neck. The rash is characterized by burning and redness. It is unknown (outdoors but no pain while outside) if there was an exposure to a precipitant. Associated symptoms include fatigue. Pertinent negatives include no congestion, fever, rhinorrhea, shortness of breath or sore throat.   Past Medical History  Diagnosis Date  . Diverticulosis   . Restless leg syndrome   . IBS (irritable bowel syndrome)   . IC (interstitial cystitis)   . Hypertension   . Diabetes mellitus     diet controlled  . Depression   . Fibromyalgia   . Anxiety   . Osteopenia   . Arthritis     in knees  . Dyslipidemia   . Chronic fatigue syndrome   . Chronic abdominal pain   . Esophageal reflux    Review of Systems  Constitutional: Positive for fatigue. Negative for fever.  HENT: Negative for congestion, sore throat and rhinorrhea.   Respiratory: Negative for shortness of breath.   Skin: Positive for rash.       Objective:   Physical Exam BP 102/72  Pulse 84  Temp(Src) 98.3 F (36.8 C) (Oral)  Ht 5' 5.5" (1.664 m)  Wt 254 lb (115.214 kg)  BMI 41.63 kg/m2  SpO2 97% Wt Readings from Last 3 Encounters:  03/28/12 254 lb (115.214 kg)  02/11/12 256 lb 12.8 oz (116.484 kg)  12/28/11 260 lb (117.935 kg)   Constitutional: She appears well-developed and well-nourished. No distress.  HENT: Head: Normocephalic and atraumatic. Ears: B TMs ok, no erythema or effusion; Nose: Nose normal. Mouth/Throat: Oropharynx is clear and moist. No oropharyngeal exudate.  Eyes: Conjunctivae and EOM are normal. Pupils are equal, round, and reactive to light. No scleral icterus.  Neck: Posterior Left side of neck - mild rosy base with petechia like lesions - does not cross  midline, no vesicles at this time. Normal range of motion. Neck supple. No JVD or LAD present. No thyromegaly present.  Cardiovascular: Normal rate, regular rhythm and normal heart sounds.  No murmur heard. No BLE edema. Pulmonary/Chest: Effort normal and breath sounds normal. No respiratory distress. She has no wheezes.  Skin: see Neck above - remain skin is warm and dry. No rash noted. No erythema.  Psychiatric: She has a normal mood and affect. Her behavior is normal. Judgment and thought content normal.       Assessment & Plan:  Possible shingles - no vesicles but unilateral rash with intense pain - outdoor exposure but no recalled contact/sting - tx empiric valtrex, advised hydrocortisone cream as needed, call if worse or unimproved

## 2012-03-28 NOTE — Assessment & Plan Note (Signed)
Working with uro in [place] for same -planning cysto procedure next week Advised to talk with uro re: possible shingles and timing of procedure

## 2012-03-28 NOTE — Patient Instructions (Signed)
It was good to see you today. Valtrex antibiotics and steroid cream - Your prescription(s) have been submitted to your pharmacy. Please take as directed and contact our office if you believe you are having problem(s) with the medication(s). Shingles Shingles is caused by the same virus that causes chickenpox. The first feelings may be pain or tingling. A rash will follow in a couple days. The rash may occur on any area of the body. Long-lasting pain is more likely in an elderly person. It can last months to years. There are medicines that can help prevent pain if you start taking them early. HOME CARE    Place cool cloths on the rash.   Only take medicine as told by your doctor.   You may use calamine lotion to relieve itchy skin.   Avoid touching:   Babies.   Children with inflamed skin (eczema).   People who have gotten transplanted organs.   People with chronic illnesses, such as leukemia and AIDS.   If the rash is on the face, you may need to see a specialist. Keep all appointments. Shingles must be kept away from the eyes, if possible.   Keep all appointments.   Avoid touching the eyes or eye area, if possible.  GET HELP RIGHT AWAY IF:    You have any pain on the face or eye.   Your medicines do not help.   Your redness or puffiness (swelling) spreads.   You have a fever.   You notice any red lines going away from the rash area.  MAKE SURE YOU:    Understand these instructions.   Will watch your condition.   Will get help right away if you are not doing well or get worse.  Document Released: 04/23/2008 Document Revised: 10/25/2011 Document Reviewed: 04/23/2008 [facility] Patient Information 2012 [place], [place].

## 2012-05-06 ENCOUNTER — Telehealth: Payer: Self-pay

## 2012-05-06 ENCOUNTER — Other Ambulatory Visit: Payer: Self-pay | Admitting: Obstetrics and Gynecology

## 2012-05-06 ENCOUNTER — Other Ambulatory Visit: Payer: Self-pay | Admitting: Internal Medicine

## 2012-05-06 DIAGNOSIS — Z1231 Encounter for screening mammogram for malignant neoplasm of breast: Secondary | ICD-10-CM

## 2012-05-06 DIAGNOSIS — Z124 Encounter for screening for malignant neoplasm of cervix: Secondary | ICD-10-CM

## 2012-05-06 NOTE — Telephone Encounter (Signed)
Pt called requesting referral to GYN Dr Madaline Guthrie [phone]

## 2012-05-07 NOTE — Telephone Encounter (Signed)
Ok done

## 2012-05-09 ENCOUNTER — Ambulatory Visit (HOSPITAL_BASED_OUTPATIENT_CLINIC_OR_DEPARTMENT_OTHER)
Admission: RE | Admit: 2012-05-09 | Discharge: 2012-05-09 | Disposition: A | Discharge status: Home / Self Care | Payer: Medicare Other | Source: Ambulatory Visit | Attending: Internal Medicine | Admitting: Internal Medicine

## 2012-05-09 DIAGNOSIS — Z1231 Encounter for screening mammogram for malignant neoplasm of breast: Secondary | ICD-10-CM | POA: Insufficient documentation

## 2012-05-10 ENCOUNTER — Other Ambulatory Visit: Payer: Self-pay | Admitting: Internal Medicine

## 2012-06-04 ENCOUNTER — Other Ambulatory Visit: Payer: Self-pay | Admitting: Internal Medicine

## 2012-06-17 ENCOUNTER — Telehealth: Payer: Self-pay | Admitting: Internal Medicine

## 2012-06-17 NOTE — Telephone Encounter (Signed)
Forward 2 pages from [facility] to Dr. Rene Paci for review on 06-17-12 ym

## 2012-06-24 ENCOUNTER — Encounter: Payer: Self-pay | Admitting: Family

## 2012-06-24 ENCOUNTER — Ambulatory Visit (INDEPENDENT_AMBULATORY_CARE_PROVIDER_SITE_OTHER): Payer: Medicare Other | Admitting: Family

## 2012-06-24 VITALS — BP 120/80 | HR 86 | Temp 98.0°F | Resp 16 | Ht 65.25 in | Wt 254.0 lb

## 2012-06-24 DIAGNOSIS — E119 Type 2 diabetes mellitus without complications: Secondary | ICD-10-CM

## 2012-06-24 DIAGNOSIS — T679XXA Effect of heat and light, unspecified, initial encounter: Secondary | ICD-10-CM

## 2012-06-24 DIAGNOSIS — Z87898 Personal history of other specified conditions: Secondary | ICD-10-CM | POA: Insufficient documentation

## 2012-06-24 DIAGNOSIS — F3289 Other specified depressive episodes: Secondary | ICD-10-CM

## 2012-06-24 DIAGNOSIS — J3489 Other specified disorders of nose and nasal sinuses: Secondary | ICD-10-CM

## 2012-06-24 DIAGNOSIS — R0981 Nasal congestion: Secondary | ICD-10-CM

## 2012-06-24 DIAGNOSIS — Z8742 Personal history of other diseases of the female genital tract: Secondary | ICD-10-CM

## 2012-06-24 DIAGNOSIS — F329 Major depressive disorder, single episode, unspecified: Secondary | ICD-10-CM

## 2012-06-24 DIAGNOSIS — R6889 Other general symptoms and signs: Secondary | ICD-10-CM

## 2012-06-24 MED ORDER — ESTRADIOL 0.025 MG/24HR TD PTTW: 1.0000 | MEDICATED_PATCH | TRANSDERMAL | Status: DC

## 2012-06-24 NOTE — Progress Notes (Signed)
Subjective:    Patient ID: [name], female    DOB: 09-27-1956, 56 y.o.   MRN: [id number]  HPI  [name] is a 56 yr old female who presents today in transfer from our [place] location due to proximity to her home.   Hx of abnormal pap- Pt reports last pap was 1 yr ago.  Last pap was abnormal.  Prior to that she had at least two abnormal pap smears.  She has been seeing [name] and Dr. Tresa Res.  She went last Tuesday and was told that they do not take her insurance.  She is very frustrated by this.    Hyperhidrosis- Reports that she has been "sweating a lot."  She reports that she has been on estrogen x 1 year.  She is still on estrogen but reports feeling embarased by her sweating.   HTN- notes occasional LE edema, improves with prn lasix.   Depression-  She follows with psychiatry.  Overall mood is reported to be good.  She denies any problems with her current medications. Reports "no energy." Sees a therapist.  Sees psychiatry- Charlene Brooke MD. She denies suicide ideations.   DM2- reports that she checks her sugar occasionally.  Was 129 this AM.    Review of Systems See HPI  Past Medical History  Diagnosis Date  . Diverticulosis   . Restless leg syndrome   . IBS (irritable bowel syndrome)   . IC (interstitial cystitis)   . Hypertension   . Diabetes mellitus     diet controlled  . Depression   . Fibromyalgia   . Anxiety   . Osteopenia   . Arthritis     in knees  . Dyslipidemia   . Chronic fatigue syndrome   . Chronic abdominal pain   . Esophageal reflux     History   Social History  . Marital Status: Divorced    Spouse Name: N/A    Number of Children: N/A  . Years of Education: N/A   Occupational History  . disabled    Social History Main Topics  . Smoking status: Former Smoker -- 0.1 packs/day for 1 years    Types: Cigarettes  . Smokeless tobacco: Not on file   Comment:  2-3 cigs a day as a teenager. Uses electronic cigarettes now.  . Alcohol  Use: Yes     2 drinks per month  . Drug Use: No  . Sexually Active: Not on file   Other Topics Concern  . Not on file   Social History Narrative   DisabledDivorced, lives alone - 1 dtr, 1 son    Past Surgical History  Procedure Date  . Total knee arthroplasty     bilateral  . Tonsillectomy and adenoidectomy   . Breast cyst excision 1978    left  . Laparotomy 1982    cyst removed from right ovary and removal of adhesions  . Abdominal exploration surgery 1983    diverticulum removed  . Appendectomy 1983    with exploration  . Laparoscopic incisional / umbilical / ventral hernia repair 1991, 2001, 2007    x 3  . Vaginal hysterectomy 1992  . Laparotomy 1991  . Scalp surgery 1993, 1998    Removal of tumor  . Nasal sinus surgery 2004  . Rectocele repair 2009  . Cervical fusion 2010    Family History  Problem Relation Age of Onset  . Breast cancer Mother   . Breast cancer Maternal Grandmother   .  Throat cancer Maternal Grandmother   . Esophageal cancer Maternal Grandmother   . Breast cancer Other     maternal great aunts x 5  . Colon cancer Neg Hx   . Diabetes Father   . Diabetes Maternal Grandfather     Allergies  Allergen Reactions  . Chlorzoxazone   . Gabapentin   . Gemfibrozil   . Metronidazole   . Morphine And Related   . Nitrofurantoin   . Olmesartan Medoxomil-Hctz   . Pentosan Polysulfate Sodium   . Propoxyphene-Acetaminophen   . Ramipril     REACTION: cough  . Sulfonamide Derivatives     Current Outpatient Prescriptions on File Prior to Visit  Medication Sig Dispense Refill  . ALPRAZolam (XANAX) 1 MG tablet Take 1 mg by mouth 3 (three) times daily as needed.        Marland Kitchen amLODipine (NORVASC) 5 MG tablet TAKE 1 TABLET EVERY DAY  30 tablet  5  . cyclobenzaprine (FLEXERIL) 10 MG tablet Take 10 mg by mouth 3 (three) times daily as needed.        . dicyclomine (BENTYL) 20 MG tablet Take 1 tablet (20 mg total) by mouth 2 (two) times daily as needed.  60  tablet  1  . fentaNYL (DURAGESIC) 100 MCG/HR Place 1 patch onto the skin every 2 days      . fluticasone (FLONASE) 50 MCG/ACT nasal spray Place 2 sprays into the nose daily.  16 g  3  . furosemide (LASIX) 20 MG tablet TAKE ONE TABLET BY MOUTH DAILY  30 tablet  5  . glucose blood (ONE TOUCH TEST STRIPS) test strip Use to check blood sugar twice a day Dx 250.00  60 each  5  . losartan (COZAAR) 50 MG tablet TAKE 1 TABLET BY MOUTH EVERY DAY  30 tablet  5  . metFORMIN (GLUCOPHAGE XR) 500 MG 24 hr tablet Take 2 tablets (1,000 mg total) by mouth daily with breakfast.  60 tablet  11  . mirabegron ER (MYRBETRIQ) 50 MG TB24 Take 50 mg by mouth every evening.      Marland Kitchen NEXIUM 40 MG capsule TAKE ONE BY MOUTH AT BEDTIME  30 capsule  5  . ONE TOUCH LANCETS MISC Use to help check blood sugar twice a day Dx 250.00  60 each  5  . promethazine (PHENERGAN) 25 MG tablet Take 1 tablet (25 mg total) by mouth at bedtime.  30 tablet  1  . risperiDONE (RISPERDAL) 0.5 MG tablet Take 0.5 mg by mouth at bedtime.      Marland Kitchen rOPINIRole (REQUIP) 4 MG tablet TAKE 1 TABLET AT BEDTIME  30 tablet  5  . topiramate (TOPAMAX) 50 MG tablet Take 50 mg by mouth 2 (two) times daily.      . traMADol (ULTRAM) 50 MG tablet Take 1 tablet (50 mg total) by mouth every 6 (six) hours as needed for pain.  40 tablet  1  . traZODone (DESYREL) 150 MG tablet Take 150 mg by mouth at bedtime.      . triamcinolone cream (KENALOG) 0.1 % Apply topically 2 (two) times daily.  30 g  0  . meloxicam (MOBIC) 15 MG tablet Take 1 tablet (15 mg total) by mouth daily.  30 tablet  2  . valACYclovir (VALTREX) 1000 MG tablet Take 1 tablet (1,000 mg total) by mouth 3 (three) times daily.  21 tablet  0    BP 120/80  Pulse 86  Temp 98 F (36.7 C) (Oral)  Resp 16  Ht 5' 5.25" (1.657 m)  Wt 254 lb (115.214 kg)  BMI 41.94 kg/m2  SpO2 98%       Objective:   Physical Exam  Constitutional: She appears well-developed. No distress.  HENT:  Head: Normocephalic and  atraumatic.  Nose: No mucosal edema or nasal deformity.  No foreign bodies.  Cardiovascular: Normal rate and regular rhythm.   No murmur heard. Pulmonary/Chest: Effort normal and breath sounds normal. No respiratory distress. She has no wheezes. She has no rales. She exhibits no tenderness.  Psychiatric: Her speech is normal and behavior is normal. Judgment and thought content normal. Cognition and memory are normal.       Tearful.            Assessment & Plan:  L nare- pt notes difficulty breathing out of the left nostril.  Request referral to ENT for this.    Post-menopausal status-  Provided 1 x refill on vivelle dot. She likely needs provera added as well, but will defer to gyn.

## 2012-06-24 NOTE — Assessment & Plan Note (Signed)
Recommended that she keep her upcoming apt with psychiatry and her therapist.

## 2012-06-24 NOTE — Assessment & Plan Note (Addendum)
Small scab noted on right heel today.  No sign of infection.  Advised pt to keep close eye on this to ensure healing.  Also instructed pt not to walk barefoot. Due for A1C, bmet.  Continue metformin.

## 2012-06-24 NOTE — Assessment & Plan Note (Signed)
Pt was hoping that I would complete pap today and became tearful when she learned that we would be referring her to GYN.  I explained to the pt that we do well woman pap smears here but we do not manage abnormal paps.  Pt verbalizes understanding.

## 2012-06-24 NOTE — Patient Instructions (Addendum)
Please complete lab work prior to leaving.  Keep your upcoming appointment with psychiatry. You will be contact about your referral to ENT and GYN. Please let us know if you have not heard back within 1 week about your referral. Follow up in 3 months.

## 2012-06-25 ENCOUNTER — Encounter: Payer: Self-pay | Admitting: Family

## 2012-06-25 LAB — BASIC METABOLIC PANEL WITH GFR
BUN: 21 mg/dL (ref 6–23)
Chloride: 104 mEq/L (ref 96–112)
Glucose, Bld: 153 mg/dL — ABNORMAL HIGH (ref 70–99)
Potassium: 4.3 mEq/L (ref 3.5–5.3)

## 2012-07-07 ENCOUNTER — Other Ambulatory Visit: Payer: Self-pay | Admitting: Obstetrics and Gynecology

## 2012-07-07 ENCOUNTER — Other Ambulatory Visit (HOSPITAL_COMMUNITY)
Admission: RE | Admit: 2012-07-07 | Discharge: 2012-07-07 | Disposition: A | Discharge status: Home / Self Care | Payer: Medicare Other | Source: Ambulatory Visit | Attending: Obstetrics and Gynecology | Admitting: Obstetrics and Gynecology

## 2012-07-07 DIAGNOSIS — Z1272 Encounter for screening for malignant neoplasm of vagina: Secondary | ICD-10-CM | POA: Insufficient documentation

## 2012-07-14 ENCOUNTER — Encounter (HOSPITAL_BASED_OUTPATIENT_CLINIC_OR_DEPARTMENT_OTHER): Payer: Self-pay | Admitting: *Deleted

## 2012-07-14 ENCOUNTER — Emergency Department (HOSPITAL_BASED_OUTPATIENT_CLINIC_OR_DEPARTMENT_OTHER): Payer: Medicare Other

## 2012-07-14 ENCOUNTER — Emergency Department (HOSPITAL_BASED_OUTPATIENT_CLINIC_OR_DEPARTMENT_OTHER)
Admission: EM | Admit: 2012-07-14 | Discharge: 2012-07-15 | Disposition: A | Discharge status: Home / Self Care | Payer: Medicare Other | Attending: Emergency Medicine | Admitting: Emergency Medicine

## 2012-07-14 DIAGNOSIS — F411 Generalized anxiety disorder: Secondary | ICD-10-CM | POA: Insufficient documentation

## 2012-07-14 DIAGNOSIS — E119 Type 2 diabetes mellitus without complications: Secondary | ICD-10-CM | POA: Insufficient documentation

## 2012-07-14 DIAGNOSIS — K219 Gastro-esophageal reflux disease without esophagitis: Secondary | ICD-10-CM | POA: Insufficient documentation

## 2012-07-14 DIAGNOSIS — I10 Essential (primary) hypertension: Secondary | ICD-10-CM | POA: Insufficient documentation

## 2012-07-14 DIAGNOSIS — R6884 Jaw pain: Secondary | ICD-10-CM | POA: Insufficient documentation

## 2012-07-14 DIAGNOSIS — E785 Hyperlipidemia, unspecified: Secondary | ICD-10-CM | POA: Insufficient documentation

## 2012-07-14 DIAGNOSIS — Z79899 Other long term (current) drug therapy: Secondary | ICD-10-CM | POA: Insufficient documentation

## 2012-07-14 DIAGNOSIS — R0789 Other chest pain: Secondary | ICD-10-CM | POA: Insufficient documentation

## 2012-07-14 LAB — CBC WITH DIFFERENTIAL/PLATELET
Eosinophils Absolute: 0.1 10*3/uL (ref 0.0–0.7)
Eosinophils Relative: 1 % (ref 0–5)
Hemoglobin: 13.4 g/dL (ref 12.0–15.0)
Lymphs Abs: 2.4 10*3/uL (ref 0.7–4.0)
MCH: 28.9 pg (ref 26.0–34.0)
MCV: 83.4 fL (ref 78.0–100.0)
Monocytes Relative: 7 % (ref 3–12)
RBC: 4.63 MIL/uL (ref 3.87–5.11)

## 2012-07-14 MED ORDER — GI COCKTAIL ~~LOC~~
30.0000 mL | Freq: Once | ORAL | Status: AC
  Administered 2012-07-14: 30 mL via ORAL

## 2012-07-14 MED ORDER — GI COCKTAIL ~~LOC~~
ORAL | Status: AC
  Administered 2012-07-14: 30 mL via ORAL
  Filled 2012-07-14: qty 30

## 2012-07-14 MED ORDER — ASPIRIN 81 MG PO CHEW
324.0000 mg | CHEWABLE_TABLET | Freq: Once | ORAL | Status: AC
  Administered 2012-07-15: 324 mg via ORAL

## 2012-07-14 MED ORDER — ASPIRIN 81 MG PO CHEW: 324.0000 mg | CHEWABLE_TABLET | Freq: Once | ORAL | Status: DC

## 2012-07-14 MED ORDER — ASPIRIN 81 MG PO CHEW
CHEWABLE_TABLET | ORAL | Status: AC
  Administered 2012-07-15: 324 mg via ORAL
  Filled 2012-07-14: qty 4

## 2012-07-14 NOTE — ED Notes (Signed)
C/o intermittent CP since yesterday with jaw pain. Pt states she has been diaphoretic at times as well. Pt took extra nexium tonight for pain without relief.

## 2012-07-14 NOTE — ED Provider Notes (Signed)
History     CSN: [id number]  Arrival date & time 07/14/12  2335   First MD Initiated Contact with Patient 07/14/12 2342      Chief Complaint  Patient presents with  . Chest Pain  . Jaw Pain    (Consider location/radiation/quality/duration/timing/severity/associated sxs/prior treatment) Patient is a 56 y.o. female presenting with chest pain. The history is provided by the patient.  Chest Pain The chest pain began yesterday. Duration of episode(s) is 1 day. Chest pain occurs constantly. The chest pain is unchanged. Associated with: none. At its most intense, the pain is at 10/10. The pain is currently at 10/10. The quality of the pain is described as dull. The pain radiates to the left jaw and right jaw. Exacerbated by: nothing. Pertinent negatives for primary symptoms include no fatigue, no shortness of breath and no palpitations.  Pertinent negatives for associated symptoms include no diaphoresis. She tried narcotics and proton pump inhibitors for the symptoms. Risk factors include sedentary lifestyle and obesity.  Pertinent negatives for past medical history include no PE and no recent injury.  Pertinent negatives for family medical history include: no sickle cell disease in family.     Past Medical History  Diagnosis Date  . Diverticulosis   . Restless leg syndrome   . IBS (irritable bowel syndrome)   . IC (interstitial cystitis)   . Hypertension   . Diabetes mellitus     diet controlled  . Depression   . Fibromyalgia   . Anxiety   . Osteopenia   . Arthritis     in knees  . Dyslipidemia   . Chronic fatigue syndrome   . Chronic abdominal pain   . Esophageal reflux     Past Surgical History  Procedure Date  . Total knee arthroplasty     bilateral  . Tonsillectomy and adenoidectomy   . Breast cyst excision 1978    left  . Laparotomy 1982    cyst removed from right ovary and removal of adhesions  . Abdominal exploration surgery 1983    diverticulum removed  .  Appendectomy 1983    with exploration  . Laparoscopic incisional / umbilical / ventral hernia repair 1991, 2001, 2007    x 3  . Vaginal hysterectomy 1992  . Laparotomy 1991  . Scalp surgery 1993, 1998    Removal of tumor  . Nasal sinus surgery 2004  . Rectocele repair 2009  . Cervical fusion 2010    Family History  Problem Relation Age of Onset  . Breast cancer Mother   . Breast cancer Maternal Grandmother   . Throat cancer Maternal Grandmother   . Esophageal cancer Maternal Grandmother   . Breast cancer Other     maternal great aunts x 5  . Colon cancer Neg Hx   . Diabetes Father   . Diabetes Maternal Grandfather     History  Substance Use Topics  . Smoking status: Former Smoker -- 0.1 packs/day for 1 years    Types: Cigarettes  . Smokeless tobacco: Not on file   Comment:  2-3 cigs a day as a teenager. Uses electronic cigarettes now.  . Alcohol Use: Yes     2 drinks per month    OB History    Grav Para Term Preterm Abortions TAB SAB Ect Mult Living                  Review of Systems  Constitutional: Negative for diaphoresis and fatigue.  HENT: Negative for neck  pain.   Respiratory: Negative for shortness of breath.   Cardiovascular: Positive for chest pain. Negative for palpitations.  All other systems reviewed and are negative.    Allergies  Chlorzoxazone; Gabapentin; Gemfibrozil; Metronidazole; Morphine and related; Nitrofurantoin; Olmesartan medoxomil-hctz; Pentosan polysulfate sodium; Propoxyphene-acetaminophen; Ramipril; and Sulfonamide derivatives  Home Medications   Current Outpatient Rx  Name Route Sig Dispense Refill  . ALPRAZOLAM 1 MG PO TABS Oral Take 1 mg by mouth 3 (three) times daily as needed.      Marland Kitchen AMLODIPINE BESYLATE 5 MG PO TABS  TAKE 1 TABLET EVERY DAY 30 tablet 5  . CYCLOBENZAPRINE HCL 10 MG PO TABS Oral Take 10 mg by mouth 3 (three) times daily as needed.      . DESVENLAFAXINE SUCCINATE ER 50 MG PO TB24 Oral Take 150 mg by mouth  daily.    Marland Kitchen DICYCLOMINE HCL 20 MG PO TABS Oral Take 1 tablet (20 mg total) by mouth 2 (two) times daily as needed. 60 tablet 1  . ESTRADIOL 0.025 MG/24HR TD PTTW Transdermal Place 1 patch onto the skin 2 (two) times a week. 8 patch 0  . FENTANYL 100 MCG/HR TD PT72  Place 1 patch onto the skin every 2 days    . FLUTICASONE PROPIONATE 50 MCG/ACT NA SUSP Nasal Place 2 sprays into the nose daily. 16 g 3  . FUROSEMIDE 20 MG PO TABS  TAKE ONE TABLET BY MOUTH DAILY 30 tablet 5  . GLUCOSE BLOOD VI STRP  Use to check blood sugar twice a day Dx 250.00 60 each 5  . LOSARTAN POTASSIUM 50 MG PO TABS  TAKE 1 TABLET BY MOUTH EVERY DAY 30 tablet 5  . METFORMIN HCL ER 500 MG PO TB24 Oral Take 2 tablets (1,000 mg total) by mouth daily with breakfast. 60 tablet 11  . MIRABEGRON ER 50 MG PO TB24 Oral Take 50 mg by mouth every evening.    Marland Kitchen NEXIUM 40 MG PO CPDR  TAKE ONE BY MOUTH AT BEDTIME 30 capsule 5  . ONETOUCH LANCETS MISC  Use to help check blood sugar twice a day Dx 250.00 60 each 5  . OXYCODONE HCL 5 MG PO TABS Oral Take 5 mg by mouth Twice daily.    Marland Kitchen PROMETHAZINE HCL 25 MG PO TABS Oral Take 1 tablet (25 mg total) by mouth at bedtime. 30 tablet 1  . RISPERIDONE 0.5 MG PO TABS Oral Take 0.5 mg by mouth at bedtime.    Marland Kitchen ROPINIROLE HCL 4 MG PO TABS  TAKE 1 TABLET AT BEDTIME 30 tablet 5  . TOPIRAMATE 50 MG PO TABS Oral Take 50 mg by mouth 2 (two) times daily.    . TRAZODONE HCL 150 MG PO TABS Oral Take 150 mg by mouth at bedtime.    . TRIAMCINOLONE ACETONIDE 0.1 % EX CREA Topical Apply topically 2 (two) times daily. 30 g 0  . TRAMADOL HCL 50 MG PO TABS Oral Take 1 tablet (50 mg total) by mouth every 6 (six) hours as needed for pain. 40 tablet 1    BP 186/96  Temp 98.9 F (37.2 C) (Oral)  Resp 17  Ht 5\' 5"  (1.651 m)  Wt 255 lb (115.667 kg)  BMI 42.43 kg/m2  SpO2 100%  Physical Exam  Constitutional: She is oriented to person, place, and time. She appears well-developed and well-nourished. No distress.    HENT:  Head: Normocephalic and atraumatic.  Mouth/Throat: Oropharynx is clear and moist.  Eyes: Conjunctivae are normal.  Pupils are equal, round, and reactive to light.  Neck: Normal range of motion. Neck supple. No JVD present.  Cardiovascular: Normal rate, regular rhythm and intact distal pulses.   Pulmonary/Chest: Effort normal and breath sounds normal. She has no wheezes. She has no rales. She exhibits tenderness.  Abdominal: Soft. Bowel sounds are normal. There is no tenderness. There is no rebound and no guarding.  Musculoskeletal: Normal range of motion.  Lymphadenopathy:    She has no cervical adenopathy.  Neurological: She is alert and oriented to person, place, and time.  Skin: Skin is warm and dry.  Psychiatric: She has a normal mood and affect.    ED Course  Procedures (including critical care time)   Labs Reviewed  CBC WITH DIFFERENTIAL  COMPREHENSIVE METABOLIC PANEL  LIPASE, BLOOD  TROPONIN I   No results found.   No diagnosis found.    MDM   Date: 07/14/2012  Rate: 94  Rhythm: normal sinus rhythm  QRS Axis: normal  Intervals: normal  ST/T Wave abnormalities: normal  Conduction Disutrbances: none  Narrative Interpretation: unremarkable     Pain consistent with MSK Cp with some superimposed anxiety. Moreover pain continually moving from place to place during ED visit, left sternal junction to clavicle to neck to back to ribs is inconsistent with cardiac etiology.   In the setting of greater than 24 hours of chest pain with normal EKG and 2 negative cardiac markers, sufficient to exclude ACS.  Pain reproducible, highly Atypical for cardiac ischemia and is likely chest wall pain.  DDimer negative reassuring, very unlikely PE or dissection as is completely negative.   Nearly completely relieved by toradol also consistent with MSK pain.  Advised patient to follow up in AM with PMD for ongoing care. Return to the closes ED for chest pain shortness of breath  with nausea or sweatiness or any concerns.  Patient verbalizes understanding and agrees to follow up.  Safe to take ultram as has had rx for same in past       Makenize Messman K Zaeden Lastinger-Rasch, MD 07/15/12 [phone]

## 2012-07-15 LAB — COMPREHENSIVE METABOLIC PANEL
Alkaline Phosphatase: 103 U/L (ref 39–117)
BUN: 24 mg/dL — ABNORMAL HIGH (ref 6–23)
Calcium: 9.3 mg/dL (ref 8.4–10.5)
GFR calc Af Amer: >90 mL/min (ref >90)
Glucose, Bld: 161 mg/dL — ABNORMAL HIGH (ref 70–99)
Total Protein: 6.9 g/dL (ref 6.0–8.3)

## 2012-07-15 LAB — TROPONIN I: Troponin I: <0.3 ng/mL (ref <0.30)

## 2012-07-15 LAB — D-DIMER, QUANTITATIVE: D-Dimer, Quant: <0.22 ug/mL-FEU (ref 0.00–0.48)

## 2012-07-15 LAB — LIPASE, BLOOD: Lipase: 13 U/L (ref 11–59)

## 2012-07-15 MED ORDER — TRAMADOL HCL 50 MG PO TABS: 50.0000 mg | ORAL_TABLET | Freq: Four times a day (QID) | ORAL | Status: DC | PRN

## 2012-07-15 MED ORDER — TRAMADOL HCL 50 MG PO TABS
ORAL_TABLET | ORAL | Status: AC
  Filled 2012-07-15: qty 1

## 2012-07-15 MED ORDER — KETOROLAC TROMETHAMINE 30 MG/ML IJ SOLN
30.0000 mg | Freq: Once | INTRAMUSCULAR | Status: AC
  Administered 2012-07-15: 30 mg via INTRAVENOUS
  Filled 2012-07-15: qty 1

## 2012-07-15 MED ORDER — IBUPROFEN 800 MG PO TABS: 800.0000 mg | ORAL_TABLET | Freq: Once | ORAL | Status: DC

## 2012-07-15 NOTE — ED Notes (Signed)
Pts CP much improved since arrival to ED. Pt refusing Ultram or Ibuprofen offered since she sts she has a "bottle of tramadol at home and Percocets."

## 2012-07-16 ENCOUNTER — Encounter: Payer: Self-pay | Admitting: Family

## 2012-07-16 ENCOUNTER — Ambulatory Visit (INDEPENDENT_AMBULATORY_CARE_PROVIDER_SITE_OTHER): Payer: Medicare Other | Admitting: Family

## 2012-07-16 VITALS — BP 122/74 | HR 80 | Temp 98.8°F | Resp 16 | Ht 65.25 in | Wt 249.0 lb

## 2012-07-16 DIAGNOSIS — R0789 Other chest pain: Secondary | ICD-10-CM

## 2012-07-16 DIAGNOSIS — Z87898 Personal history of other specified conditions: Secondary | ICD-10-CM

## 2012-07-16 DIAGNOSIS — Z8742 Personal history of other diseases of the female genital tract: Secondary | ICD-10-CM

## 2012-07-16 NOTE — Assessment & Plan Note (Signed)
Management per GYN.  

## 2012-07-16 NOTE — Assessment & Plan Note (Signed)
ED work up negative.  Symptoms most consistent with musculoskeletal pain.

## 2012-07-16 NOTE — Progress Notes (Signed)
Subjective:    Patient ID: [name], female    DOB: 13-Jul-1956, 56 y.o.   MRN: [id number]  HPI  [name] is a 56 yr old female who presents today for ED follow up. She was seen in the ED on 8/26 for atypical chest pain.  D. Dimer and cardiac markers were negative.  Her chest pain was felt to be musculoskeletal in nature.   Since returning home, she reports improvement in her chest pain but feels dizzy. She report + chest wall tenderness to palpation.  Denies fever, reports IC is at baseline.  Denies cough/cold symptoms.    Abnormal pap/HRT- She saw Vena Austria green- she had a follow up pap.  Last saw her 2 weeks ago- reports that she had repeat pap smear obtained.  Depression- psychiatry- Charlene Brooke MD. Saw her recently. Reports no change in meds, but that her mood is much better. She has follow up in September and is continuing to see her therapist once a month.    Review of Systems See HPI Past Medical History  Diagnosis Date  . Diverticulosis   . Restless leg syndrome   . IBS (irritable bowel syndrome)   . IC (interstitial cystitis)   . Hypertension   . Diabetes mellitus     diet controlled  . Depression   . Fibromyalgia   . Anxiety   . Osteopenia   . Arthritis     in knees  . Dyslipidemia   . Chronic fatigue syndrome   . Chronic abdominal pain   . Esophageal reflux     History   Social History  . Marital Status: Divorced    Spouse Name: N/A    Number of Children: N/A  . Years of Education: N/A   Occupational History  . disabled    Social History Main Topics  . Smoking status: Former Smoker -- 0.1 packs/day for 1 years    Types: Cigarettes  . Smokeless tobacco: Not on file  . Alcohol Use: Yes     2 drinks per month  . Drug Use: No  . Sexually Active: Not on file   Other Topics Concern  . Not on file   Social History Narrative   DisabledDivorced, lives alone - 1 dtr, 1 son    Past Surgical History  Procedure Date  . Total knee  arthroplasty     bilateral  . Tonsillectomy and adenoidectomy   . Breast cyst excision 1978    left  . Laparotomy 1982    cyst removed from right ovary and removal of adhesions  . Abdominal exploration surgery 1983    diverticulum removed  . Appendectomy 1983    with exploration  . Laparoscopic incisional / umbilical / ventral hernia repair 1991, 2001, 2007    x 3  . Vaginal hysterectomy 1992  . Laparotomy 1991  . Scalp surgery 1993, 1998    Removal of tumor  . Nasal sinus surgery 2004  . Rectocele repair 2009  . Cervical fusion 2010    Family History  Problem Relation Age of Onset  . Breast cancer Mother   . Breast cancer Maternal Grandmother   . Throat cancer Maternal Grandmother   . Esophageal cancer Maternal Grandmother   . Breast cancer Other     maternal great aunts x 5  . Colon cancer Neg Hx   . Diabetes Father   . Diabetes Maternal Grandfather     Allergies  Allergen Reactions  . Chlorzoxazone   .  Gabapentin   . Gemfibrozil   . Metronidazole   . Morphine And Related   . Nitrofurantoin   . Olmesartan Medoxomil-Hctz   . Ondansetron Nausea And Vomiting  . Pentosan Polysulfate Sodium   . Propoxyphene-Acetaminophen   . Ramipril     REACTION: cough  . Sulfonamide Derivatives     Current Outpatient Prescriptions on File Prior to Visit  Medication Sig Dispense Refill  . ALPRAZolam (XANAX) 1 MG tablet Take 1 mg by mouth 3 (three) times daily as needed.        Marland Kitchen amLODipine (NORVASC) 5 MG tablet TAKE 1 TABLET EVERY DAY  30 tablet  5  . cyclobenzaprine (FLEXERIL) 10 MG tablet Take 10 mg by mouth 3 (three) times daily as needed.        . desvenlafaxine (PRISTIQ) 50 MG 24 hr tablet Take 150 mg by mouth daily.      Marland Kitchen dicyclomine (BENTYL) 20 MG tablet Take 1 tablet (20 mg total) by mouth 2 (two) times daily as needed.  60 tablet  1  . estradiol (VIVELLE-DOT) 0.025 MG/24HR Place 1 patch onto the skin 2 (two) times a week.  8 patch  0  . fentaNYL (DURAGESIC) 100  MCG/HR Place 1 patch onto the skin every 2 days      . fluticasone (FLONASE) 50 MCG/ACT nasal spray Place 2 sprays into the nose daily.  16 g  3  . furosemide (LASIX) 20 MG tablet TAKE ONE TABLET BY MOUTH DAILY  30 tablet  5  . glucose blood (ONE TOUCH TEST STRIPS) test strip Use to check blood sugar twice a day Dx 250.00  60 each  5  . losartan (COZAAR) 50 MG tablet TAKE 1 TABLET BY MOUTH EVERY DAY  30 tablet  5  . metFORMIN (GLUCOPHAGE XR) 500 MG 24 hr tablet Take 2 tablets (1,000 mg total) by mouth daily with breakfast.  60 tablet  11  . mirabegron ER (MYRBETRIQ) 50 MG TB24 Take 50 mg by mouth every evening.      Marland Kitchen NEXIUM 40 MG capsule TAKE ONE BY MOUTH AT BEDTIME  30 capsule  5  . ONE TOUCH LANCETS MISC Use to help check blood sugar twice a day Dx 250.00  60 each  5  . oxyCODONE (OXY IR/ROXICODONE) 5 MG immediate release tablet Take 5 mg by mouth Twice daily.      . promethazine (PHENERGAN) 25 MG tablet Take 1 tablet (25 mg total) by mouth at bedtime.  30 tablet  1  . risperiDONE (RISPERDAL) 0.5 MG tablet Take 0.5 mg by mouth at bedtime.      Marland Kitchen rOPINIRole (REQUIP) 4 MG tablet TAKE 1 TABLET AT BEDTIME  30 tablet  5  . topiramate (TOPAMAX) 50 MG tablet Take 50 mg by mouth 2 (two) times daily.      . traZODone (DESYREL) 150 MG tablet Take 150 mg by mouth at bedtime.      . triamcinolone cream (KENALOG) 0.1 % Apply topically 2 (two) times daily.  30 g  0  . traMADol (ULTRAM) 50 MG tablet Take 1 tablet (50 mg total) by mouth every 6 (six) hours as needed for pain.  40 tablet  1  . traMADol (ULTRAM) 50 MG tablet Take 1 tablet (50 mg total) by mouth every 6 (six) hours as needed for pain.  15 tablet  0    BP 122/74  Pulse 80  Temp 98.8 F (37.1 C) (Oral)  Resp 16  Ht 5'  5.25" (1.657 m)  Wt 249 lb 0.6 oz (112.964 kg)  BMI 41.13 kg/m2  SpO2 98%       Objective:   Physical Exam  Constitutional: She is oriented to person, place, and time. She appears well-developed and well-nourished. No  distress.  HENT:  Head: Normocephalic and atraumatic.  Cardiovascular: Normal rate and regular rhythm.   No murmur heard. Pulmonary/Chest: Effort normal and breath sounds normal. No respiratory distress. She has no wheezes. She has no rales. She exhibits no tenderness.  Musculoskeletal: She exhibits no edema.       Anterior chest wall tenderness to palpation  Neurological: She is alert and oriented to person, place, and time.  Skin: Skin is warm and dry.  Psychiatric: She has a normal mood and affect. Her behavior is normal. Judgment and thought content normal.          Assessment & Plan:

## 2012-07-16 NOTE — Patient Instructions (Addendum)
Please keep your upcoming apt in November. Call if symptoms worsen or if they do not improve.

## 2012-07-18 ENCOUNTER — Telehealth: Payer: Self-pay | Admitting: Family

## 2012-07-18 MED ORDER — CLINDAMYCIN PHOSPHATE 2 % VA CREA: 1.0000 | TOPICAL_CREAM | Freq: Every day | VAGINAL | Status: AC

## 2012-07-18 NOTE — Telephone Encounter (Signed)
Notified pt. 

## 2012-07-18 NOTE — Telephone Encounter (Signed)
Message copied by Sandford Craze on Fri Jul 18, 2012  9:33 AM ------      Message from: Mervin Kung A      Created: Thu Jul 17, 2012  8:44 AM       Office note was faxed to [place] yesterday. Looks like Clindesse was prescribed for pt. I spoke with [facility] today and requested pap smear result be faxed to [place] when it is final.                  ----- Message -----         From: Sandford Craze, NP         Sent: 07/16/2012  11:18 AM           To: Kathi Simpers, CMA            Could you pls call pt's pharmacy and ask what Dr. Chilton Si prescribed that she could not afford- I think it was something for bv?  Also, could we please request office notes/pap results from Dr. Vena Austria green's office?

## 2012-07-18 NOTE — Telephone Encounter (Signed)
Pls call pt and let her know that I sent rx for clindamycin cream- similar to what dr. Chilton Si sent.  Hopefully this one will be covered by her insurance.

## 2012-07-28 ENCOUNTER — Encounter: Payer: Medicare Other | Admitting: Internal Medicine

## 2012-08-18 ENCOUNTER — Other Ambulatory Visit: Payer: Self-pay | Admitting: Family

## 2012-08-19 DIAGNOSIS — Z8673 Personal history of transient ischemic attack (TIA), and cerebral infarction without residual deficits: Secondary | ICD-10-CM

## 2012-08-19 HISTORY — DX: Personal history of transient ischemic attack (TIA), and cerebral infarction without residual deficits: Z86.73

## 2012-08-19 NOTE — Telephone Encounter (Signed)
Vivelle-DOT request [Last Rx 08.06.12 #[id number]]/SLS Please advise.

## 2012-08-22 ENCOUNTER — Emergency Department (HOSPITAL_BASED_OUTPATIENT_CLINIC_OR_DEPARTMENT_OTHER)
Admission: EM | Admit: 2012-08-22 | Discharge: 2012-08-23 | Disposition: A | Discharge status: Home / Self Care | Payer: Medicare Other | Attending: Emergency Medicine | Admitting: Emergency Medicine

## 2012-08-22 ENCOUNTER — Telehealth: Payer: Self-pay | Admitting: *Deleted

## 2012-08-22 ENCOUNTER — Emergency Department (HOSPITAL_BASED_OUTPATIENT_CLINIC_OR_DEPARTMENT_OTHER): Payer: Medicare Other

## 2012-08-22 ENCOUNTER — Encounter (HOSPITAL_BASED_OUTPATIENT_CLINIC_OR_DEPARTMENT_OTHER): Payer: Self-pay

## 2012-08-22 DIAGNOSIS — I10 Essential (primary) hypertension: Secondary | ICD-10-CM | POA: Insufficient documentation

## 2012-08-22 DIAGNOSIS — E119 Type 2 diabetes mellitus without complications: Secondary | ICD-10-CM | POA: Insufficient documentation

## 2012-08-22 DIAGNOSIS — Z79899 Other long term (current) drug therapy: Secondary | ICD-10-CM | POA: Insufficient documentation

## 2012-08-22 DIAGNOSIS — R531 Weakness: Secondary | ICD-10-CM

## 2012-08-22 DIAGNOSIS — R5381 Other malaise: Secondary | ICD-10-CM | POA: Insufficient documentation

## 2012-08-22 DIAGNOSIS — F29 Unspecified psychosis not due to a substance or known physiological condition: Secondary | ICD-10-CM | POA: Insufficient documentation

## 2012-08-22 DIAGNOSIS — R4789 Other speech disturbances: Secondary | ICD-10-CM | POA: Insufficient documentation

## 2012-08-22 DIAGNOSIS — G459 Transient cerebral ischemic attack, unspecified: Secondary | ICD-10-CM | POA: Insufficient documentation

## 2012-08-22 LAB — DIFFERENTIAL
Basophils Absolute: 0 10*3/uL (ref 0.0–0.1)
Basophils Relative: 1 % (ref 0–1)
Lymphocytes Relative: 31 % (ref 12–46)
Neutro Abs: 3.8 10*3/uL (ref 1.7–7.7)
Neutrophils Relative %: 60 % (ref 43–77)

## 2012-08-22 LAB — COMPREHENSIVE METABOLIC PANEL
ALT: 18 U/L (ref 0–35)
AST: 14 U/L (ref 0–37)
Albumin: 4 g/dL (ref 3.5–5.2)
CO2: 27 mEq/L (ref 19–32)
Chloride: 102 mEq/L (ref 96–112)
GFR calc non Af Amer: >90 mL/min (ref >90)
Sodium: 137 mEq/L (ref 135–145)
Total Bilirubin: 0.2 mg/dL — ABNORMAL LOW (ref 0.3–1.2)

## 2012-08-22 LAB — RAPID URINE DRUG SCREEN, HOSP PERFORMED
Amphetamines: NOT DETECTED
Barbiturates: NOT DETECTED
Cocaine: NOT DETECTED
Opiates: NOT DETECTED
Tetrahydrocannabinol: NOT DETECTED

## 2012-08-22 LAB — GLUCOSE, CAPILLARY: Glucose-Capillary: 121 mg/dL — ABNORMAL HIGH (ref 70–99)

## 2012-08-22 LAB — PROTIME-INR
INR: 1.05 (ref 0.00–1.49)
Prothrombin Time: 13.6 seconds (ref 11.6–15.2)

## 2012-08-22 LAB — CBC
MCHC: 34.4 g/dL (ref 30.0–36.0)
Platelets: 186 10*3/uL (ref 150–400)
RDW: 12.7 % (ref 11.5–15.5)
WBC: 6.4 10*3/uL (ref 4.0–10.5)

## 2012-08-22 LAB — APTT: aPTT: 28 seconds (ref 24–37)

## 2012-08-22 MED ORDER — ACETAMINOPHEN 325 MG PO TABS
650.0000 mg | ORAL_TABLET | Freq: Once | ORAL | Status: AC
  Administered 2012-08-22: 650 mg via ORAL
  Filled 2012-08-22: qty 2

## 2012-08-22 MED ORDER — CYCLOBENZAPRINE HCL 10 MG PO TABS: 10.0000 mg | ORAL_TABLET | Freq: Three times a day (TID) | ORAL | Status: DC | PRN

## 2012-08-22 MED ORDER — ALPRAZOLAM 0.5 MG PO TABS: 1.0000 mg | ORAL_TABLET | Freq: Three times a day (TID) | ORAL | Status: DC | PRN

## 2012-08-22 MED ORDER — OXYCODONE HCL 5 MG PO TABS
5.0000 mg | ORAL_TABLET | Freq: Two times a day (BID) | ORAL | Status: DC
  Administered 2012-08-22 – 2012-08-23 (×2): 5 mg via ORAL
  Filled 2012-08-22 (×2): qty 1

## 2012-08-22 MED ORDER — TRAZODONE HCL 50 MG PO TABS
150.0000 mg | ORAL_TABLET | Freq: Every day | ORAL | Status: DC
  Administered 2012-08-22: 150 mg via ORAL
  Filled 2012-08-22 (×2): qty 3

## 2012-08-22 MED ORDER — PROMETHAZINE HCL 25 MG PO TABS: 25.0000 mg | ORAL_TABLET | Freq: Every day | ORAL | Status: DC

## 2012-08-22 MED ORDER — METFORMIN HCL ER 500 MG PO TB24
1000.0000 mg | ORAL_TABLET | Freq: Every day | ORAL | Status: DC
  Administered 2012-08-23: 1000 mg via ORAL
  Filled 2012-08-22 (×2): qty 2

## 2012-08-22 MED ORDER — PANTOPRAZOLE SODIUM 40 MG PO TBEC
80.0000 mg | DELAYED_RELEASE_TABLET | Freq: Every day | ORAL | Status: DC
  Administered 2012-08-23: 80 mg via ORAL
  Filled 2012-08-22: qty 2

## 2012-08-22 MED ORDER — PROMETHAZINE HCL 25 MG PO TABS
25.0000 mg | ORAL_TABLET | Freq: Every day | ORAL | Status: DC
  Administered 2012-08-22: 25 mg via ORAL
  Filled 2012-08-22 (×2): qty 1

## 2012-08-22 MED ORDER — MIRABEGRON ER 50 MG PO TB24
50.0000 mg | ORAL_TABLET | Freq: Every evening | ORAL | Status: DC
  Filled 2012-08-22 (×2): qty 1

## 2012-08-22 MED ORDER — RISPERIDONE 0.5 MG PO TABS
0.5000 mg | ORAL_TABLET | Freq: Every day | ORAL | Status: DC
  Administered 2012-08-22: 0.5 mg via ORAL
  Filled 2012-08-22 (×2): qty 1

## 2012-08-22 MED ORDER — LOSARTAN POTASSIUM 50 MG PO TABS
50.0000 mg | ORAL_TABLET | Freq: Every day | ORAL | Status: DC
  Administered 2012-08-22: 50 mg via ORAL
  Filled 2012-08-22: qty 1

## 2012-08-22 MED ORDER — AMLODIPINE BESYLATE 5 MG PO TABS
5.0000 mg | ORAL_TABLET | Freq: Every day | ORAL | Status: DC
  Administered 2012-08-22: 5 mg via ORAL
  Filled 2012-08-22: qty 1

## 2012-08-22 MED ORDER — FUROSEMIDE 20 MG PO TABS
20.0000 mg | ORAL_TABLET | Freq: Every day | ORAL | Status: DC
  Administered 2012-08-23: 20 mg via ORAL
  Filled 2012-08-22: qty 1

## 2012-08-22 MED ORDER — AMLODIPINE BESYLATE 5 MG PO TABS: 5.0000 mg | ORAL_TABLET | Freq: Every day | ORAL | Status: DC

## 2012-08-22 MED ORDER — ROPINIROLE HCL 1 MG PO TABS
4.0000 mg | ORAL_TABLET | Freq: Every day | ORAL | Status: DC
Start: 2012-08-22 — End: 2012-08-23
  Administered 2012-08-22: 4 mg via ORAL
  Filled 2012-08-22 (×2): qty 4

## 2012-08-22 NOTE — ED Notes (Signed)
Received bedside report from [facility] RN; patient sent from [facility] to rule out a TIA.  Recommended admission; patient does not want to be admitted because she is leaving for the beach in the morning, but has agreed to be observed overnight.  Last vitals from [facility]: 133/81 BP, 71 HR, 16 RR, 99% O2 on room air.  Background:  Patient was at a health fair today; had blood pressure checked.  Reading was 200's/100's.  Patient started to have confusion, some dysphagia, and unsteady gait.  Patient complaining of headache 5/10 on the numerical pain scale; patient was given 650 Tylenol around 1930; patient reports that it has helped with the pain somewhat.  Neuro and stroke scale negative upon [facility] arrival (confusion and dysphagia improved upon arrival to [facility]).  Patient feels that speech is "off", but is clear.  Patient alert and oriented x4; PERRL present.  Report given to Vickey Huger, Charity fundraiser.

## 2012-08-22 NOTE — ED Provider Notes (Addendum)
History     CSN: [id number]  Arrival date & time 08/22/12  1456   First MD Initiated Contact with Patient 08/22/12 1546      Chief Complaint  Patient presents with  . Hypertension    (Consider location/radiation/quality/duration/timing/severity/associated sxs/prior treatment) HPI  Patient at health fair today; and had elevated bp at 220/110.  Pressure behind eyes bilaterally began after.  Patient states pressure still there.  Called pmd office and told to come here.  States no complaints of lateralized weakness, vision changes, but noted some hesitancy of speech which lasted 10-15.  Friend states she was "wobbling and holding on to wall." Patient had taken bp meds at usual time last night.   Past Medical History  Diagnosis Date  . Diverticulosis   . Restless leg syndrome   . IBS (irritable bowel syndrome)   . IC (interstitial cystitis)   . Hypertension   . Diabetes mellitus     diet controlled  . Depression   . Fibromyalgia   . Anxiety   . Osteopenia   . Arthritis     in knees  . Dyslipidemia   . Chronic fatigue syndrome   . Chronic abdominal pain   . Esophageal reflux     Past Surgical History  Procedure Date  . Total knee arthroplasty     bilateral  . Tonsillectomy and adenoidectomy   . Breast cyst excision 1978    left  . Laparotomy 1982    cyst removed from right ovary and removal of adhesions  . Abdominal exploration surgery 1983    diverticulum removed  . Appendectomy 1983    with exploration  . Laparoscopic incisional / umbilical / ventral hernia repair 1991, 2001, 2007    x 3  . Vaginal hysterectomy 1992  . Laparotomy 1991  . Scalp surgery 1993, 1998    Removal of tumor  . Nasal sinus surgery 2004  . Rectocele repair 2009  . Cervical fusion 2010    Family History  Problem Relation Age of Onset  . Breast cancer Mother   . Breast cancer Maternal Grandmother   . Throat cancer Maternal Grandmother   . Esophageal cancer Maternal Grandmother   .  Breast cancer Other     maternal great aunts x 5  . Colon cancer Neg Hx   . Diabetes Father   . Diabetes Maternal Grandfather     History  Substance Use Topics  . Smoking status: Former Smoker -- 0.1 packs/day for 1 years    Types: Cigarettes  . Smokeless tobacco: Not on file  . Alcohol Use: Yes     2 drinks per month    OB History    Grav Para Term Preterm Abortions TAB SAB Ect Mult Living                  Review of Systems  Constitutional: Negative for fever, chills, activity change, appetite change and unexpected weight change.  HENT: Negative for sore throat, rhinorrhea, neck pain, neck stiffness and sinus pressure.   Eyes: Negative for visual disturbance.  Respiratory: Negative for cough and shortness of breath.   Cardiovascular: Negative for chest pain and leg swelling.  Gastrointestinal: Negative for vomiting, abdominal pain, diarrhea and blood in stool.  Genitourinary: Negative for dysuria, urgency, frequency, vaginal discharge and difficulty urinating.  Musculoskeletal: Negative for myalgias, arthralgias and gait problem.  Skin: Negative for color change and rash.  Neurological: Negative for weakness, light-headedness and headaches.  Hematological: Does not bruise/bleed easily.  Psychiatric/Behavioral: Negative for dysphoric mood.    Allergies  Chlorzoxazone; Gabapentin; Gemfibrozil; Metronidazole; Morphine and related; Nitrofurantoin; Olmesartan medoxomil-hctz; Ondansetron; Pentosan polysulfate sodium; Propoxyphene-acetaminophen; Ramipril; and Sulfonamide derivatives  Home Medications   Current Outpatient Rx  Name Route Sig Dispense Refill  . ALPRAZOLAM 1 MG PO TABS Oral Take 1 mg by mouth 3 (three) times daily as needed.      Marland Kitchen AMLODIPINE BESYLATE 5 MG PO TABS  TAKE 1 TABLET EVERY DAY 30 tablet 5  . CYCLOBENZAPRINE HCL 10 MG PO TABS Oral Take 10 mg by mouth 3 (three) times daily as needed.      . DESVENLAFAXINE SUCCINATE ER 50 MG PO TB24 Oral Take 150 mg by  mouth daily.    Marland Kitchen DICYCLOMINE HCL 20 MG PO TABS Oral Take 1 tablet (20 mg total) by mouth 2 (two) times daily as needed. 60 tablet 1  . FENTANYL 100 MCG/HR TD PT72  Place 1 patch onto the skin every 2 days    . FLUTICASONE PROPIONATE 50 MCG/ACT NA SUSP Nasal Place 2 sprays into the nose daily. 16 g 3  . FUROSEMIDE 20 MG PO TABS  TAKE ONE TABLET BY MOUTH DAILY 30 tablet 5  . GLUCOSE BLOOD VI STRP  Use to check blood sugar twice a day Dx 250.00 60 each 5  . LOSARTAN POTASSIUM 50 MG PO TABS  TAKE 1 TABLET BY MOUTH EVERY DAY 30 tablet 5  . METFORMIN HCL ER 500 MG PO TB24 Oral Take 2 tablets (1,000 mg total) by mouth daily with breakfast. 60 tablet 11  . MIRABEGRON ER 50 MG PO TB24 Oral Take 50 mg by mouth every evening.    Marland Kitchen NEXIUM 40 MG PO CPDR  TAKE ONE BY MOUTH AT BEDTIME 30 capsule 5  . ONETOUCH LANCETS MISC  Use to help check blood sugar twice a day Dx 250.00 60 each 5  . OXYCODONE HCL 5 MG PO TABS Oral Take 5 mg by mouth Twice daily.    Marland Kitchen PROMETHAZINE HCL 25 MG PO TABS Oral Take 1 tablet (25 mg total) by mouth at bedtime. 30 tablet 1  . RISPERIDONE 0.5 MG PO TABS Oral Take 0.5 mg by mouth at bedtime.    Marland Kitchen ROPINIROLE HCL 4 MG PO TABS  TAKE 1 TABLET AT BEDTIME 30 tablet 5  . TOPIRAMATE 50 MG PO TABS Oral Take 50 mg by mouth 2 (two) times daily.    . TRAMADOL HCL 50 MG PO TABS Oral Take 1 tablet (50 mg total) by mouth every 6 (six) hours as needed for pain. 40 tablet 1  . TRAMADOL HCL 50 MG PO TABS Oral Take 1 tablet (50 mg total) by mouth every 6 (six) hours as needed for pain. 15 tablet 0  . TRAZODONE HCL 150 MG PO TABS Oral Take 150 mg by mouth at bedtime.    . TRIAMCINOLONE ACETONIDE 0.1 % EX CREA Topical Apply topically 2 (two) times daily. 30 g 0  . VIVELLE-DOT 0.025 MG/24HR TD PTTW  PLACE 1 PATCH ONTO THE SKIN 2 (TWO) TIMES A WEEK. 8 patch 5    BP 128/74  Pulse 76  Temp 98.4 F (36.9 C) (Oral)  Resp 16  Ht 5\' 5"  (1.651 m)  Wt 253 lb (114.76 kg)  BMI 42.10 kg/m2  SpO2  98%  Physical Exam  Nursing note and vitals reviewed. Constitutional: She is oriented to person, place, and time. She appears well-developed and well-nourished.       obese  HENT:  Head: Normocephalic and atraumatic.  Eyes: Conjunctivae normal and EOM are normal. Pupils are equal, round, and reactive to light.  Neck: Normal range of motion. Neck supple.  Cardiovascular: Normal rate, regular rhythm, normal heart sounds and intact distal pulses.   Pulmonary/Chest: Effort normal and breath sounds normal.  Abdominal: Soft. Bowel sounds are normal.  Musculoskeletal: Normal range of motion.  Neurological: She is alert and oriented to person, place, and time. She has normal strength and normal reflexes. No sensory deficit. She displays a negative Romberg sign. GCS eye subscore is 4. GCS verbal subscore is 5. GCS motor subscore is 6.  Skin: Skin is warm and dry.  Psychiatric: She has a normal mood and affect. Thought content normal.    ED Course  Procedures (including critical care time)  Labs Reviewed - No data to display No results found.   No diagnosis found.   Date: 08/22/2012  Rate: 74  Rhythm: normal sinus rhythm  QRS Axis: normal  Intervals: normal  ST/T Wave abnormalities: normal  Conduction Disutrbances:none  Narrative Interpretation:   Old EKG Reviewed: unchanged    MDM   Results for orders placed during the hospital encounter of 08/22/12  PROTIME-INR      Component Value Range   Prothrombin Time 13.6  11.6 - 15.2 seconds   INR 1.05  0.00 - 1.49  APTT      Component Value Range   aPTT 28  24 - 37 seconds  CBC      Component Value Range   WBC 6.4  4.0 - 10.5 K/uL   RBC 4.56  3.87 - 5.11 MIL/uL   Hemoglobin 13.3  12.0 - 15.0 g/dL   HCT 04.5  40.9 - 81.1 %   MCV 84.9  78.0 - 100.0 fL   MCH 29.2  26.0 - 34.0 pg   MCHC 34.4  30.0 - 36.0 g/dL   RDW 91.4  78.2 - 95.6 %   Platelets 186  150 - 400 K/uL  DIFFERENTIAL      Component Value Range   Neutrophils  Relative 60  43 - 77 %   Neutro Abs 3.8  1.7 - 7.7 K/uL   Lymphocytes Relative 31  12 - 46 %   Lymphs Abs 2.0  0.7 - 4.0 K/uL   Monocytes Relative 7  3 - 12 %   Monocytes Absolute 0.5  0.1 - 1.0 K/uL   Eosinophils Relative 1  0 - 5 %   Eosinophils Absolute 0.1  0.0 - 0.7 K/uL   Basophils Relative 1  0 - 1 %   Basophils Absolute 0.0  0.0 - 0.1 K/uL  COMPREHENSIVE METABOLIC PANEL      Component Value Range   Sodium 137  135 - 145 mEq/L   Potassium 3.9  3.5 - 5.1 mEq/L   Chloride 102  96 - 112 mEq/L   CO2 27  19 - 32 mEq/L   Glucose, Bld 150 (*) 70 - 99 mg/dL   BUN 24 (*) 6 - 23 mg/dL   Creatinine, Ser 2.13  0.50 - 1.10 mg/dL   Calcium 9.3  8.4 - 08.6 mg/dL   Total Protein 6.9  6.0 - 8.3 g/dL   Albumin 4.0  3.5 - 5.2 g/dL   AST 14  0 - 37 U/L   ALT 18  0 - 35 U/L   Alkaline Phosphatase 84  39 - 117 U/L   Total Bilirubin 0.2 (*) 0.3 - 1.2 mg/dL  GFR calc non Af Amer >90  >90 mL/min   GFR calc Af Amer >90  >90 mL/min  TROPONIN I      Component Value Range   Troponin I <0.30  <0.30 ng/mL  URINE RAPID DRUG SCREEN (HOSP PERFORMED)      Component Value Range   Opiates NONE DETECTED  NONE DETECTED   Cocaine NONE DETECTED  NONE DETECTED   Benzodiazepines NONE DETECTED  NONE DETECTED   Amphetamines NONE DETECTED  NONE DETECTED   Tetrahydrocannabinol NONE DETECTED  NONE DETECTED   Barbiturates NONE DETECTED  NONE DETECTED  GLUCOSE, CAPILLARY      Component Value Range   Glucose-Capillary 121 (*) 70 - 99 mg/dL   Ct Head Wo Contrast  08/22/2012  *RADIOLOGY REPORT*  Clinical Data: Headache.  Hypertension.  CT HEAD WITHOUT CONTRAST  Technique:  Contiguous axial images were obtained from the base of the skull through the vertex without contrast.  Comparison: None  Findings: The brain has a normal appearance without evidence of old or acute infarction, mass lesion, hemorrhage, hydrocephalus or extra-axial collection.  Paranasal sinuses are clear.  No fluid in the middle ears or mastoids.   No calvarial abnormality.  IMPRESSION: Normal head CT.   Original Report Authenticated By: Thomasenia Sales, M.D.     Patient with normal workup here. I plan to transfer the patient to [place] to the CDU on the TIA protocol. Patient's care discussed with Dr. Preston Fleeting. I discussed the plan with the patient on multiple occasions and she does not wish to be transferred. I have discussed with her the risks of leaving AGAINST MEDICAL ADVICE. I discussed with her that this could be the  a stroke and could cause mortality or significant morbidity. The patient voices understanding and still wishes to leave against medical device.  Date: 08/22/2012 Patient: [name] Admitted: 08/22/2012  3:15 PM Attending Provider: Hilario Quarry, MD  Mason Jim or her authorized caregiver has made the decision for the patient to leave the emergency department against the advice of Hilario Quarry, MD.  She or her authorized caregiver has been informed and understands the inherent risks, including death.  She or her authorized caregiver has decided to accept the responsibility for this decision. Mason Jim and all necessary parties have been advised that she may return for further evaluation or treatment. Her condition at time of discharge was Stable.  Mason Jim had current vital signs as follows:  Blood pressure 144/76, pulse 75, temperature 98.4 F (36.9 C), temperature source Oral, resp. rate 16, height 5\' 5"  (1.651 m), weight 253 lb (114.76 kg), SpO2 100.00%.   Mason Jim or her authorized caregiver has signed the Leaving Against Medical Advice form prior to leaving the department.  Marsella Suman S 08/22/2012            Hilario Quarry, MD 08/22/12 1814  Hilario Quarry, MD 08/22/12 1817  Nursing staff reported that patient has changed her mind or wishes to go forward with the transfer for the TIA protocol.  Hilario Quarry, MD 08/22/12 [phone]

## 2012-08-22 NOTE — ED Provider Notes (Signed)
Patient transferred from [facility] to be placed on TIA protocol.  Patient resting comfortably at present.  Lungs CTA bilaterally.  S1/S2. RRR.  Abdomen soft, bowel sounds present.  MOEx4, equal grip strength.  Strong distal pulses palpated all extremities.  Monitor reveals NSR without ectopy.  Orders for evening/bedtime medications entered.  Diagnostic and treatment plan discussed with patient.     Jimmye Norman, NP 08/22/12 [phone]

## 2012-08-22 NOTE — Telephone Encounter (Signed)
Pt called to report that her BP was 210/110 at her apt complex health fair today. Pt reports that she had her BP rechecked 30 minutes later and it was 180/100. Just checked BP around 2:30pm and reported 133/100.  Pt states that other residents were telling her that she was walking off balance earlier and she reports difficulty finding the right words to say about an hour ago. Reports that she is feeling better now. Confirmed with Provider and advised pt that she should be evaluated in the ER and not to drive herself.  Pt voices understanding.

## 2012-08-22 NOTE — ED Notes (Signed)
Elevated BP x 3 at Health [facility] today-pt c/o pressure behind both eyes since approx 1130am-had episode where she was talking to a friend "and the words weren't coming out right" approx 1 hour PTA-states normal speech when spoke to PCP office and at [facility]-NA[facility] noted

## 2012-08-22 NOTE — ED Notes (Signed)
Patient transported to CT 

## 2012-08-22 NOTE — ED Notes (Signed)
[facility] has been called for transfer to CDU at [facility]

## 2012-08-22 NOTE — ED Notes (Signed)
Received call from Thayer Ohm, RN at [facility].  Report on this pt had not been passed along to her.  Report on this pt given again.

## 2012-08-23 ENCOUNTER — Emergency Department (HOSPITAL_COMMUNITY): Payer: Medicare Other

## 2012-08-23 DIAGNOSIS — R079 Chest pain, unspecified: Secondary | ICD-10-CM

## 2012-08-23 DIAGNOSIS — G459 Transient cerebral ischemic attack, unspecified: Secondary | ICD-10-CM

## 2012-08-23 HISTORY — PX: TRANSTHORACIC ECHOCARDIOGRAM: SHX275

## 2012-08-23 MED ORDER — LORAZEPAM 2 MG/ML IJ SOLN
1.0000 mg | Freq: Once | INTRAMUSCULAR | Status: AC
  Administered 2012-08-23: 1 mg via INTRAVENOUS

## 2012-08-23 MED ORDER — FENTANYL 25 MCG/HR TD PT72
100.0000 ug | MEDICATED_PATCH | TRANSDERMAL | Status: DC
  Filled 2012-08-23: qty 4

## 2012-08-23 MED ORDER — DIAZEPAM 5 MG/ML IJ SOLN
10.0000 mg | Freq: Once | INTRAMUSCULAR | Status: AC
  Administered 2012-08-23: 10 mg via INTRAVENOUS

## 2012-08-23 NOTE — Progress Notes (Signed)
MRI brain w/o cm done only. Exam was terminated at pt's request. Despite receiving IV ativan prior to exam ; pt was still too claustrophobic to complete all that was originally ordered.

## 2012-08-23 NOTE — ED Provider Notes (Signed)
Medical screening examination/treatment/procedure(s) were performed by non-physician practitioner and as supervising physician I was immediately available for consultation/collaboration.  Isiaih Hollenbach R. Luwanda Starr, MD 08/23/12 1610 

## 2012-08-23 NOTE — ED Notes (Signed)
Patient requesting to be moved to another room. States noise  from tube system is disturbing her and she felt like she wouldn't be able to sleep. Will continue to monitor patient

## 2012-08-23 NOTE — Progress Notes (Signed)
Bilateral:  No evidence of hemodynamically significant internal carotid artery stenosis.   Vertebral artery flow is antegrade.     

## 2012-08-23 NOTE — ED Provider Notes (Signed)
Pt is feeling better.  Neurologically intact. Heart RRR, Lungs CTA-B.  PERRLA.  No chest pain, sob, or palpitations. She has discomfort behind left eye w/o visual loss, blurry vision, or double vision.  Heart RRR, Lungs CTA-B.  Awaiting further testing of MRA, Carotid U/S, and 2D Echo.  Pt is hoping to get to the beach today.  Lindley Magnus [place], [place] 08/23/12 [phone]  Reviewed results of the following with pt: *Carotid U/S- Bilateral: No evidence of hemodynamically significant internal carotid artery stenosis. Vertebral artery flow is antegrade.  *MR Brain: possible micro emboli with punctate infarctions in the thalami and deep brain.  No large ischemic insult.  Otherwise normal. *ECHO:Study Conclusions - Left ventricle: The cavity size was normal. Wall thickness was normal. Systolic function was normal. The estimated ejection fraction was in the range of 60% to 65%. Wall motion was normal; there were no regional wall motion abnormalities. - Atrial septum: No defect or patent foramen ovale was identified.  Due to abnormal MRI of brain, consulted neurology.  Plan to repeat MR of brain with slices for further evaluation.  If MRI of brain continues to remain abnormal, consult Dr. Petra Kuba for further evaluation.  Pt voiced understanding.  Lindley Magnus [place], [place] 08/23/12 1444  Discussed case with Adaline Sill, PA-C who will assume care.  Lindley Magnus [place], [place] 08/23/12 [phone]

## 2012-08-23 NOTE — ED Provider Notes (Signed)
Patient continues in CDU under TIA protocol.  Suspected anomaly in initial MRI that has been discussed with neurology is not confirmed with repeat small slice MRI.  No current indication of CNS insult.  Results discussed with patient and with Dr. Manus Gunning.  Patient resting comfortably without complaint.  Awake, alert.  Ambulatory in the department.  Lungs CTA bilaterally.  S1/S2, RRR.  Abdomen soft, bowel sounds present.  Will start on daily aspirin.  Patient will be discharged home with follow-up by her PCP.  Jimmye Norman, NP 08/24/12 [phone]

## 2012-08-23 NOTE — ED Provider Notes (Signed)
Medical screening examination/treatment/procedure(s) were performed by non-physician practitioner and as supervising physician I was immediately available for consultation/collaboration.   Dione Booze, MD 08/23/12 [phone]

## 2012-08-23 NOTE — ED Notes (Signed)
Patient at bedside in chair without complaints at this time. Watching TV. Dinner tray ordered.

## 2012-08-23 NOTE — Progress Notes (Signed)
*  PRELIMINARY RESULTS* Echocardiogram 2D Echocardiogram has been performed.  Jeryl Columbia 08/23/2012, 11:44 AM

## 2012-08-24 ENCOUNTER — Telehealth: Payer: Self-pay | Admitting: Family

## 2012-08-24 NOTE — ED Notes (Signed)
Patient was given fentanyl patch by Shearon Balo, RN at 0945am on 08-23-12, placed on her left shoulder.  RN could not get the medication to scan due to packaging.  This RN can verify that patient did indeed receive the fentanyl patch as ordered.

## 2012-08-24 NOTE — Telephone Encounter (Signed)
Pls call pt to arrange a post hospital follow up visit this week.

## 2012-08-24 NOTE — ED Provider Notes (Signed)
Medical screening examination/treatment/procedure(s) were performed by non-physician practitioner and as supervising physician I was immediately available for consultation/collaboration.   Glynn Octave, MD 08/24/12 [phone]

## 2012-08-25 NOTE — Telephone Encounter (Signed)
Patient has scheduled a post hospital follow up for 08/26/12

## 2012-08-26 ENCOUNTER — Ambulatory Visit (INDEPENDENT_AMBULATORY_CARE_PROVIDER_SITE_OTHER): Payer: Medicare Other | Admitting: Family

## 2012-08-26 ENCOUNTER — Encounter: Payer: Self-pay | Admitting: Family

## 2012-08-26 VITALS — BP 126/80 | HR 77 | Temp 97.9°F | Resp 16 | Ht 65.25 in | Wt 256.0 lb

## 2012-08-26 DIAGNOSIS — I10 Essential (primary) hypertension: Secondary | ICD-10-CM

## 2012-08-26 DIAGNOSIS — G459 Transient cerebral ischemic attack, unspecified: Secondary | ICD-10-CM

## 2012-08-26 NOTE — Progress Notes (Signed)
Subjective:    Patient ID: [name], female    DOB: 06/19/1956, 56 y.o.   MRN: [id number]  HPI  Ms. Tacker is a 56 yr old female who presents today for ED follow up. She was evaluated int the ED on 10/4 after she was found to have an elevated blood pressure at a health fair at work.  BP was reported as 220/110.  Pt had pressure behind her eyes bilaterally and apparently had some hesitancy of speech and was unsteady.  She was sent to the ED.  ED records are reviewed and note normal CBC, normal EKG, normal liver testing, normal renal functioning.  Drug screen was negative.  CT of the head was normal.  She was advised to be transferred to the [facility] CDU under the TIA protocol, but she declined and left against medical advise.  Reports generalizes weakness.  Reports feeling light headed.  Notes feeling unsteady.  Sometimes has some trouble getting out her words, but not as bad as it was Friday.   HTN- she brings with her a BP log.  BP has been ranging 114-150/81-107.  HRT- has been on estrogen patch x 2 years.      Review of Systems    see HPI  Past Medical History  Diagnosis Date  . Diverticulosis   . Restless leg syndrome   . IBS (irritable bowel syndrome)   . IC (interstitial cystitis)   . Hypertension   . Diabetes mellitus     diet controlled  . Depression   . Fibromyalgia   . Anxiety   . Osteopenia   . Arthritis     in knees  . Dyslipidemia   . Chronic fatigue syndrome   . Chronic abdominal pain   . Esophageal reflux     History   Social History  . Marital Status: Divorced    Spouse Name: N/A    Number of Children: N/A  . Years of Education: N/A   Occupational History  . disabled    Social History Main Topics  . Smoking status: Former Smoker -- 0.1 packs/day for 1 years    Types: Cigarettes  . Smokeless tobacco: Not on file   Comment: using electronic cigarettes  . Alcohol Use: Yes     2 drinks per month  . Drug Use: No  . Sexually Active:  Not on file   Other Topics Concern  . Not on file   Social History Narrative   DisabledDivorced, lives alone - 1 dtr, 1 son    Past Surgical History  Procedure Date  . Total knee arthroplasty     bilateral  . Tonsillectomy and adenoidectomy   . Breast cyst excision 1978    left  . Laparotomy 1982    cyst removed from right ovary and removal of adhesions  . Abdominal exploration surgery 1983    diverticulum removed  . Appendectomy 1983    with exploration  . Laparoscopic incisional / umbilical / ventral hernia repair 1991, 2001, 2007    x 3  . Vaginal hysterectomy 1992  . Laparotomy 1991  . Scalp surgery 1993, 1998    Removal of tumor  . Nasal sinus surgery 2004  . Rectocele repair 2009  . Cervical fusion 2010    Family History  Problem Relation Age of Onset  . Breast cancer Mother   . Breast cancer Maternal Grandmother   . Throat cancer Maternal Grandmother   . Esophageal cancer Maternal Grandmother   . Breast  cancer Other     maternal great aunts x 5  . Colon cancer Neg Hx   . Diabetes Father   . Diabetes Maternal Grandfather     Allergies  Allergen Reactions  . Chlorzoxazone     unknown  . Gabapentin     nausea  . Gemfibrozil     nausea  . Metronidazole     nausea  . Morphine And Related     vomiting  . Nitrofurantoin     nausea  . Olmesartan Medoxomil-Hctz     nausea  . Ondansetron Nausea And Vomiting  . Pentosan Polysulfate Sodium     unknown  . Propoxyphene-Acetaminophen     nausea  . Ramipril     REACTION: cough  . Sulfonamide Derivatives     nausea    Current Outpatient Prescriptions on File Prior to Visit  Medication Sig Dispense Refill  . ALPRAZolam (XANAX) 1 MG tablet Take 1 mg by mouth 3 (three) times daily as needed. For anxiety      . amLODipine (NORVASC) 5 MG tablet Take 5 mg by mouth at bedtime.      . cyclobenzaprine (FLEXERIL) 10 MG tablet Take 10-20 mg by mouth 3 (three) times daily as needed. For pain      .  desvenlafaxine (PRISTIQ) 50 MG 24 hr tablet Take 150 mg by mouth daily.      Marland Kitchen dicyclomine (BENTYL) 20 MG tablet Take 20 mg by mouth 2 (two) times daily as needed. Irration bowels      . fentaNYL (DURAGESIC) 100 MCG/HR Place 1 patch onto the skin every 2 days      . fluticasone (FLONASE) 50 MCG/ACT nasal spray Place 2 sprays into the nose daily.  16 g  3  . furosemide (LASIX) 20 MG tablet TAKE ONE TABLET BY MOUTH DAILY  30 tablet  5  . losartan (COZAAR) 50 MG tablet Take 50 mg by mouth at bedtime.      . metFORMIN (GLUCOPHAGE XR) 500 MG 24 hr tablet Take 2 tablets (1,000 mg total) by mouth daily with breakfast.  60 tablet  11  . mirabegron ER (MYRBETRIQ) 50 MG TB24 Take 50 mg by mouth every evening.      Marland Kitchen NEXIUM 40 MG capsule TAKE ONE BY MOUTH AT BEDTIME  30 capsule  5  . oxyCODONE (OXY IR/ROXICODONE) 5 MG immediate release tablet Take 5 mg by mouth 3 (three) times daily.      . promethazine (PHENERGAN) 25 MG tablet Take 25 mg by mouth at bedtime. Patient takes every day per patient      . rOPINIRole (REQUIP) 4 MG tablet TAKE 1 TABLET AT BEDTIME  30 tablet  5  . traZODone (DESYREL) 150 MG tablet Take 150 mg by mouth at bedtime.      Marland Kitchen DISCONTD: traMADol (ULTRAM) 50 MG tablet Take 1 tablet (50 mg total) by mouth every 6 (six) hours as needed for pain.  15 tablet  0    BP 126/80  Pulse 77  Temp 97.9 F (36.6 C) (Oral)  Resp 16  Ht 5' 5.25" (1.657 m)  Wt 256 lb 0.6 oz (116.139 kg)  BMI 42.28 kg/m2  SpO2 99%    Objective:   Physical Exam  Constitutional: She is oriented to person, place, and time. She appears well-developed and well-nourished. No distress.  HENT:  Head: Normocephalic and atraumatic.  Eyes: EOM are normal. Pupils are equal, round, and reactive to light.  Cardiovascular: Normal rate and  regular rhythm.   No murmur heard. Pulmonary/Chest: Effort normal and breath sounds normal. No respiratory distress. She has no wheezes. She has no rales. She exhibits no tenderness.    Neurological: She is alert and oriented to person, place, and time. No cranial nerve deficit. She exhibits normal muscle tone. Coordination normal.  Psychiatric: She has a normal mood and affect. Her behavior is normal. Judgment and thought content normal.          Assessment & Plan:

## 2012-08-26 NOTE — Patient Instructions (Addendum)
Stop estrogen patch. Start aspirin 325mg  once daily. Follow up on 11/6 as scheduled. You will be contact about your referral.  Please let us know if you have not heard back within 1 week about your referral. Go to ER if you develop recurrent TIA symptoms such as weakness, numbness, slurred speech.

## 2012-09-01 DIAGNOSIS — G459 Transient cerebral ischemic attack, unspecified: Secondary | ICD-10-CM | POA: Insufficient documentation

## 2012-09-01 NOTE — Assessment & Plan Note (Signed)
BP Readings from Last 3 Encounters:  08/26/12 126/80  08/23/12 143/72  07/16/12 122/74   BP looks good today, continue current meds.

## 2012-09-01 NOTE — Assessment & Plan Note (Signed)
Add aspirin, refer to neuro.  Stop estrogen due to increased risk of CVA.

## 2012-09-02 ENCOUNTER — Telehealth: Payer: Self-pay | Admitting: Family

## 2012-09-02 ENCOUNTER — Encounter (HOSPITAL_BASED_OUTPATIENT_CLINIC_OR_DEPARTMENT_OTHER): Payer: Self-pay | Admitting: *Deleted

## 2012-09-02 ENCOUNTER — Emergency Department (HOSPITAL_BASED_OUTPATIENT_CLINIC_OR_DEPARTMENT_OTHER)
Admission: EM | Admit: 2012-09-02 | Discharge: 2012-09-02 | Disposition: A | Discharge status: Home / Self Care | Payer: Medicare Other | Attending: Emergency Medicine | Admitting: Emergency Medicine

## 2012-09-02 DIAGNOSIS — G9332 Myalgic encephalomyelitis/chronic fatigue syndrome: Secondary | ICD-10-CM | POA: Insufficient documentation

## 2012-09-02 DIAGNOSIS — R42 Dizziness and giddiness: Secondary | ICD-10-CM | POA: Insufficient documentation

## 2012-09-02 DIAGNOSIS — F3289 Other specified depressive episodes: Secondary | ICD-10-CM | POA: Insufficient documentation

## 2012-09-02 DIAGNOSIS — K219 Gastro-esophageal reflux disease without esophagitis: Secondary | ICD-10-CM | POA: Insufficient documentation

## 2012-09-02 DIAGNOSIS — Z79899 Other long term (current) drug therapy: Secondary | ICD-10-CM | POA: Insufficient documentation

## 2012-09-02 DIAGNOSIS — R209 Unspecified disturbances of skin sensation: Secondary | ICD-10-CM | POA: Insufficient documentation

## 2012-09-02 DIAGNOSIS — R531 Weakness: Secondary | ICD-10-CM

## 2012-09-02 DIAGNOSIS — I10 Essential (primary) hypertension: Secondary | ICD-10-CM | POA: Insufficient documentation

## 2012-09-02 DIAGNOSIS — F411 Generalized anxiety disorder: Secondary | ICD-10-CM | POA: Insufficient documentation

## 2012-09-02 DIAGNOSIS — R5382 Chronic fatigue, unspecified: Secondary | ICD-10-CM | POA: Insufficient documentation

## 2012-09-02 DIAGNOSIS — M1710 Unilateral primary osteoarthritis, unspecified knee: Secondary | ICD-10-CM | POA: Insufficient documentation

## 2012-09-02 DIAGNOSIS — E785 Hyperlipidemia, unspecified: Secondary | ICD-10-CM | POA: Insufficient documentation

## 2012-09-02 DIAGNOSIS — R5381 Other malaise: Secondary | ICD-10-CM | POA: Insufficient documentation

## 2012-09-02 DIAGNOSIS — E119 Type 2 diabetes mellitus without complications: Secondary | ICD-10-CM | POA: Insufficient documentation

## 2012-09-02 DIAGNOSIS — IMO0001 Reserved for inherently not codable concepts without codable children: Secondary | ICD-10-CM | POA: Insufficient documentation

## 2012-09-02 DIAGNOSIS — Z7982 Long term (current) use of aspirin: Secondary | ICD-10-CM | POA: Insufficient documentation

## 2012-09-02 DIAGNOSIS — F329 Major depressive disorder, single episode, unspecified: Secondary | ICD-10-CM | POA: Insufficient documentation

## 2012-09-02 LAB — URINALYSIS, ROUTINE W REFLEX MICROSCOPIC
Bilirubin Urine: NEGATIVE
Glucose, UA: 500 mg/dL — AB
Hgb urine dipstick: NEGATIVE
Ketones, ur: NEGATIVE mg/dL
Protein, ur: NEGATIVE mg/dL

## 2012-09-02 LAB — CBC WITH DIFFERENTIAL/PLATELET
Eosinophils Absolute: 0.1 10*3/uL (ref 0.0–0.7)
Eosinophils Relative: 2 % (ref 0–5)
HCT: 37.6 % (ref 36.0–46.0)
Lymphocytes Relative: 26 % (ref 12–46)
Lymphs Abs: 1.5 10*3/uL (ref 0.7–4.0)
MCH: 29 pg (ref 26.0–34.0)
MCV: 85.3 fL (ref 78.0–100.0)
Monocytes Absolute: 0.4 10*3/uL (ref 0.1–1.0)
Monocytes Relative: 7 % (ref 3–12)
RBC: 4.41 MIL/uL (ref 3.87–5.11)
WBC: 5.8 10*3/uL (ref 4.0–10.5)

## 2012-09-02 LAB — BASIC METABOLIC PANEL
BUN: 16 mg/dL (ref 6–23)
CO2: 29 mEq/L (ref 19–32)
Calcium: 9 mg/dL (ref 8.4–10.5)
Creatinine, Ser: 0.7 mg/dL (ref 0.50–1.10)
GFR calc non Af Amer: >90 mL/min (ref >90)
Glucose, Bld: 200 mg/dL — ABNORMAL HIGH (ref 70–99)

## 2012-09-02 NOTE — ED Notes (Addendum)
States last night her BP was elevated. She felt weak and right foot was numb. She called her MD last and this am they returned her call and she was told to go th the nearest ED. She is ambulatory in no distress. BP is 129/71. States an hour ago she had some sharp sternal pain that lasted 5 minutes and went away.

## 2012-09-02 NOTE — Telephone Encounter (Signed)
Refill clindamycin 2% vaginal cream 40 gm place 1 application vaginally at bedtime last fill 07-18-2012

## 2012-09-02 NOTE — ED Notes (Signed)
Pt reports feeling lightheaded and elevated BP last pm and today.  She also reports right foot numbness last night.  Symptoms have subsided this afternoon.

## 2012-09-02 NOTE — ED Provider Notes (Signed)
History     CSN: [id number]  Arrival date & time 09/02/12  1352   First MD Initiated Contact with Patient 09/02/12 1411      Chief Complaint  Patient presents with  . Hypertension     Patient is a 56 y.o. female presenting with weakness. The history is provided by the patient.  Weakness The primary symptoms include dizziness and paresthesias. Primary symptoms do not include headaches, syncope, loss of consciousness, altered mental status, seizures, visual change, focal weakness, loss of sensation, speech change, memory loss, fever, nausea or vomiting. The symptoms began yesterday. The symptoms are resolved. The neurological symptoms are diffuse.  Dizziness also occurs with weakness. Dizziness does not occur with nausea or vomiting.  Additional symptoms include weakness. Additional symptoms do not include neck stiffness, nystagmus, hearing loss or vertigo.  Pt reports last night her BP was elevated (160/100) and she felt her right was numb.  No back or neck pain.  It did not radiate down her leg.  It has since resolved.  No focal weakness.  No visual loss.  No slurred speech.  No falls reported.  No vertigo reported.  No active CP/SOB. No abd pain.   She was instructed to go to ED for evaluation She reports recent evalaution for similar episode and told she had TIA She denies known h/o CAD/CVA in past  Past Medical History  Diagnosis Date  . Diverticulosis   . Restless leg syndrome   . IBS (irritable bowel syndrome)   . IC (interstitial cystitis)   . Hypertension   . Diabetes mellitus     diet controlled  . Depression   . Fibromyalgia   . Anxiety   . Osteopenia   . Arthritis     in knees  . Dyslipidemia   . Chronic fatigue syndrome   . Chronic abdominal pain   . Esophageal reflux     Past Surgical History  Procedure Date  . Total knee arthroplasty     bilateral  . Tonsillectomy and adenoidectomy   . Breast cyst excision 1978    left  . Laparotomy 1982    cyst  removed from right ovary and removal of adhesions  . Abdominal exploration surgery 1983    diverticulum removed  . Appendectomy 1983    with exploration  . Laparoscopic incisional / umbilical / ventral hernia repair 1991, 2001, 2007    x 3  . Vaginal hysterectomy 1992  . Laparotomy 1991  . Scalp surgery 1993, 1998    Removal of tumor  . Nasal sinus surgery 2004  . Rectocele repair 2009  . Cervical fusion 2010    Family History  Problem Relation Age of Onset  . Breast cancer Mother   . Breast cancer Maternal Grandmother   . Throat cancer Maternal Grandmother   . Esophageal cancer Maternal Grandmother   . Breast cancer Other     maternal great aunts x 5  . Colon cancer Neg Hx   . Diabetes Father   . Diabetes Maternal Grandfather     History  Substance Use Topics  . Smoking status: Former Smoker -- 0.1 packs/day for 1 years    Types: Cigarettes  . Smokeless tobacco: Not on file   Comment: using electronic cigarettes  . Alcohol Use: Yes     2 drinks per month    OB History    Grav Para Term Preterm Abortions TAB SAB Ect Mult Living  Review of Systems  Constitutional: Negative for fever.  HENT: Negative for hearing loss and neck stiffness.   Cardiovascular: Negative for syncope.  Gastrointestinal: Negative for nausea and vomiting.  Neurological: Positive for dizziness, weakness and paresthesias. Negative for vertigo, speech change, focal weakness, seizures, loss of consciousness and headaches.  Psychiatric/Behavioral: Negative for memory loss and altered mental status.  All other systems reviewed and are negative.    Allergies  Chlorzoxazone; Gabapentin; Gemfibrozil; Metronidazole; Morphine and related; Nitrofurantoin; Olmesartan medoxomil-hctz; Ondansetron; Pentosan polysulfate sodium; Propoxyphene-acetaminophen; Ramipril; and Sulfonamide derivatives  Home Medications   Current Outpatient Rx  Name Route Sig Dispense Refill  . ALPRAZOLAM 1 MG  PO TABS Oral Take 1 mg by mouth 3 (three) times daily as needed. For anxiety    . AMLODIPINE BESYLATE 5 MG PO TABS Oral Take 5 mg by mouth at bedtime.    . ASPIRIN EC 325 MG PO TBEC Oral Take 325 mg by mouth daily.    . CYCLOBENZAPRINE HCL 10 MG PO TABS Oral Take 10-20 mg by mouth 3 (three) times daily as needed. For pain    . DESVENLAFAXINE SUCCINATE ER 50 MG PO TB24 Oral Take 150 mg by mouth daily.    Marland Kitchen DICYCLOMINE HCL 20 MG PO TABS Oral Take 20 mg by mouth 2 (two) times daily as needed. Irration bowels    . FENTANYL 100 MCG/HR TD PT72  Place 1 patch onto the skin every 2 days    . FLUTICASONE PROPIONATE 50 MCG/ACT NA SUSP Nasal Place 2 sprays into the nose daily. 16 g 3  . FUROSEMIDE 20 MG PO TABS  TAKE ONE TABLET BY MOUTH DAILY 30 tablet 5  . LOSARTAN POTASSIUM 50 MG PO TABS Oral Take 50 mg by mouth at bedtime.    Marland Kitchen METFORMIN HCL ER 500 MG PO TB24 Oral Take 2 tablets (1,000 mg total) by mouth daily with breakfast. 60 tablet 11  . MIRABEGRON ER 50 MG PO TB24 Oral Take 50 mg by mouth every evening.    Marland Kitchen NEXIUM 40 MG PO CPDR  TAKE ONE BY MOUTH AT BEDTIME 30 capsule 5  . OXYCODONE HCL 5 MG PO TABS Oral Take 5 mg by mouth 3 (three) times daily.    Marland Kitchen PROMETHAZINE HCL 25 MG PO TABS Oral Take 25 mg by mouth at bedtime. Patient takes every day per patient    . RISPERIDONE 0.5 MG PO TABS Oral Take 1 tablet by mouth daily.    Marland Kitchen ROPINIROLE HCL 4 MG PO TABS  TAKE 1 TABLET AT BEDTIME 30 tablet 5  . TOPIRAMATE 50 MG PO TABS Oral Take 50 mg by mouth daily.    . TRAMADOL HCL 50 MG PO TABS Oral Take 50 mg by mouth every 6 (six) hours as needed.    . TRAZODONE HCL 150 MG PO TABS Oral Take 150 mg by mouth at bedtime.      BP 129/71  Pulse 78  Temp 98.6 F (37 C) (Oral)  Resp 20  SpO2 98%  Physical Exam CONSTITUTIONAL: Well developed/well nourished HEAD AND FACE: Normocephalic/atraumatic EYES: EOMI/PERRL, normal appearance, no nystagmus ENMT: Mucous membranes moist NECK: supple no meningeal signs,  no bruits SPINE:entire spine nontender CV: S1/S2 noted, no murmurs/rubs/gallops noted LUNGS: Lungs are clear to auscultation bilaterally, no apparent distress ABDOMEN: soft, nontender, no rebound or guarding GU:no cva tenderness NEURO: Awake/alert, facies symmetric, no arm or leg drift is noted Cranial nerves 3/4/5/6/05/27/09/11/12 tested and intact She can ambulate unassisted and no ataxia  No dysarthria.  Speech is clear No aphasia noted No past pointing EXTREMITIES: pulses normal, full ROM, the feet are warm to touch SKIN: warm, color normal PSYCH: no abnormalities of mood noted     ED Course  Procedures   3:21 PM Pt instructed to go to ED by PCP (phone note reviewed) She had recent extensive evaluation including normal MRI/Echo/Carotid ultrasound.  No acute emergency identified.   She appears well at this time.  She is able to ambulate on her own.  She reports some dizziness but no ataxia.  Some of this could be med-related.  No significant HTN here.   I doubt acute CVA at this time.  Will check bmp/cbc/urine.  If negative can be discharged home and f/u with PCP.  She also has specialist referral as well.  3:52 PM Labs reassuring Discussed strict return precautions Advised to continue meds, including ASA and f/u with PCP  Pt agreeable   MDM  Nursing notes including past medical history and social history reviewed and considered in documentation Previous records reviewed and considered - recent ED evaluation for TIA Labs/vital reviewed and considered        Date: 09/02/2012  Rate: 71  Rhythm: normal sinus rhythm  QRS Axis: normal  Intervals: normal  ST/T Wave abnormalities: nonspecific ST changes  Conduction Disutrbances:none  Narrative Interpretation:   Old EKG Reviewed: unchanged    Joya Gaskins, MD 09/02/12 1553

## 2012-09-02 NOTE — Telephone Encounter (Signed)
Please advise re refill request.

## 2012-09-02 NOTE — Telephone Encounter (Signed)
Reviewed chart and did not see that pt went to the ER for evaluation. Contacted pt, she states she started feeling better last night and did not go. Reports that her BP at 11am today was 138/94. Feels very weak and is a "little off balance". Advised pt she should be evaluated in the ER. Notified Provider and confirmed instruction. 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 Advised pt she should not wait any longer and should proceed to the nearest ER ASAP.  Pt voices understanding.  [facility] Triage Call Report Triage Record Num: [id number] Operator: Kelle Darting Patient Name: [name] Call Date & Time: 09/01/2012 11:47:36PM Patient Phone: [phone] PCP: Sandford Craze, NP Patient Gender: Female PCP Fax : [phone] Patient DOB: 01/04/1956 Practice Name: [facility] [place] [facility] Reason for Call: Caller: [name]/Patient; PCP: Sandford Craze (Adults only); CB#: [phone]; Call regarding 168/106 BP; very weak; Afebrile; Onset: 09/01/12 at 2300; Sx notes: Feeling weak, right foot is numb, not feeling right at all, blood pressure is elevated, TIA on 08/22/12; Blood sugar 164; Guideline used: HTN, Diagnosed or Suspected; Disposition: Activate EMS due to new unexplained change in sensation; Patient states that she does not want to call 911; Spoke with Dr. Lovell Sheehan and he said it is fine if patient has her neighbor drive her to the ED for evaluation, should go to the [address] location to be seen quicker; Patient notified of same and voiced understanding of same, is going to get her neighbor to drive her; Appt. made: No; Patient is going to [facility] ED for evaluation. Protocol(s) Used: Hypertension, Diagnosed or Suspected Recommended Outcome per Protocol: Activate EMS 911 Reason for Outcome: New unexplained weakness/paralysis, change in sensation (numbness or tingling) or inability to purposely move, especially when one side of body is  involved

## 2012-09-02 NOTE — ED Notes (Signed)
MD at bedside. 

## 2012-09-03 NOTE — Telephone Encounter (Signed)
If she is still symptomatic we should do a wet prep first to make sure that we are giving her the right medicine.

## 2012-09-03 NOTE — Telephone Encounter (Signed)
Notified pt and scheduled f/u for 09/09/12 at 3:15pm due to pt's schedule.

## 2012-09-09 ENCOUNTER — Encounter: Payer: Self-pay | Admitting: Family

## 2012-09-09 ENCOUNTER — Ambulatory Visit (INDEPENDENT_AMBULATORY_CARE_PROVIDER_SITE_OTHER): Payer: Medicare Other | Admitting: Family

## 2012-09-09 VITALS — BP 130/90 | HR 93 | Temp 98.3°F | Resp 18 | Ht 65.25 in | Wt 255.0 lb

## 2012-09-09 DIAGNOSIS — N898 Other specified noninflammatory disorders of vagina: Secondary | ICD-10-CM

## 2012-09-09 DIAGNOSIS — I10 Essential (primary) hypertension: Secondary | ICD-10-CM

## 2012-09-09 DIAGNOSIS — N949 Unspecified condition associated with female genital organs and menstrual cycle: Secondary | ICD-10-CM

## 2012-09-09 DIAGNOSIS — N760 Acute vaginitis: Secondary | ICD-10-CM | POA: Insufficient documentation

## 2012-09-09 NOTE — Progress Notes (Signed)
Subjective:    Patient ID: [name], female    DOB: 11/04/1956, 56 y.o.   MRN: [id number]  HPI  [name] is a 56 yr old female who presents today with complaint of vaginal odor. She reports associated vaginal dryness which seems to be worse since she stopped her estrogen. She is not currently sexually active.  HTN_ reports that she was seeing high bp readings at home (154/94)  on myrebetriq and she discontinued. Sees urologist tomorrow.  Reports ongoing nocturia.    Review of Systems     Past Medical History  Diagnosis Date  . Diverticulosis   . Restless leg syndrome   . IBS (irritable bowel syndrome)   . IC (interstitial cystitis)   . Hypertension   . Diabetes mellitus     diet controlled  . Depression   . Fibromyalgia   . Anxiety   . Osteopenia   . Arthritis     in knees  . Dyslipidemia   . Chronic fatigue syndrome   . Chronic abdominal pain   . Esophageal reflux     History   Social History  . Marital Status: Divorced    Spouse Name: N/A    Number of Children: N/A  . Years of Education: N/A   Occupational History  . disabled    Social History Main Topics  . Smoking status: Former Smoker -- 0.1 packs/day for 1 years    Types: Cigarettes  . Smokeless tobacco: Not on file   Comment: using electronic cigarettes  . Alcohol Use: Yes     2 drinks per month  . Drug Use: No  . Sexually Active: Not on file   Other Topics Concern  . Not on file   Social History Narrative   DisabledDivorced, lives alone - 1 dtr, 1 son    Past Surgical History  Procedure Date  . Total knee arthroplasty     bilateral  . Tonsillectomy and adenoidectomy   . Breast cyst excision 1978    left  . Laparotomy 1982    cyst removed from right ovary and removal of adhesions  . Abdominal exploration surgery 1983    diverticulum removed  . Appendectomy 1983    with exploration  . Laparoscopic incisional / umbilical / ventral hernia repair 1991, 2001, 2007    x 3  .  Vaginal hysterectomy 1992  . Laparotomy 1991  . Scalp surgery 1993, 1998    Removal of tumor  . Nasal sinus surgery 2004  . Rectocele repair 2009  . Cervical fusion 2010    Family History  Problem Relation Age of Onset  . Breast cancer Mother   . Breast cancer Maternal Grandmother   . Throat cancer Maternal Grandmother   . Esophageal cancer Maternal Grandmother   . Breast cancer Other     maternal great aunts x 5  . Colon cancer Neg Hx   . Diabetes Father   . Diabetes Maternal Grandfather     Allergies  Allergen Reactions  . Chlorzoxazone     unknown  . Gabapentin     nausea  . Gemfibrozil     nausea  . Metronidazole     nausea  . Morphine And Related     vomiting  . Nitrofurantoin     nausea  . Olmesartan Medoxomil-Hctz     nausea  . Ondansetron Nausea And Vomiting  . Pentosan Polysulfate Sodium     unknown  . Propoxyphene-Acetaminophen     nausea  .  Ramipril     REACTION: cough  . Sulfonamide Derivatives     nausea    Current Outpatient Prescriptions on File Prior to Visit  Medication Sig Dispense Refill  . ALPRAZolam (XANAX) 1 MG tablet Take 1 mg by mouth 3 (three) times daily as needed. For anxiety      . amLODipine (NORVASC) 5 MG tablet Take 5 mg by mouth at bedtime.      Marland Kitchen aspirin EC 325 MG tablet Take 325 mg by mouth daily.      . cyclobenzaprine (FLEXERIL) 10 MG tablet Take 10-20 mg by mouth 3 (three) times daily as needed. For pain      . desvenlafaxine (PRISTIQ) 50 MG 24 hr tablet Take 150 mg by mouth daily.      Marland Kitchen dicyclomine (BENTYL) 20 MG tablet Take 20 mg by mouth 2 (two) times daily as needed. Irration bowels      . fentaNYL (DURAGESIC) 100 MCG/HR Place 1 patch onto the skin every 2 days      . fluticasone (FLONASE) 50 MCG/ACT nasal spray Place 2 sprays into the nose daily.  16 g  3  . furosemide (LASIX) 20 MG tablet TAKE ONE TABLET BY MOUTH DAILY  30 tablet  5  . losartan (COZAAR) 50 MG tablet Take 50 mg by mouth at bedtime.      .  metFORMIN (GLUCOPHAGE XR) 500 MG 24 hr tablet Take 2 tablets (1,000 mg total) by mouth daily with breakfast.  60 tablet  11  . NEXIUM 40 MG capsule TAKE ONE BY MOUTH AT BEDTIME  30 capsule  5  . oxyCODONE (OXY IR/ROXICODONE) 5 MG immediate release tablet Take 5 mg by mouth 3 (three) times daily.      . promethazine (PHENERGAN) 25 MG tablet Take 25 mg by mouth at bedtime. Patient takes every day per patient      . risperiDONE (RISPERDAL) 0.5 MG tablet Take 1 tablet by mouth daily.      Marland Kitchen rOPINIRole (REQUIP) 4 MG tablet TAKE 1 TABLET AT BEDTIME  30 tablet  5  . topiramate (TOPAMAX) 50 MG tablet Take 50 mg by mouth daily.      . traMADol (ULTRAM) 50 MG tablet Take 50 mg by mouth every 6 (six) hours as needed.      . traZODone (DESYREL) 150 MG tablet Take 150 mg by mouth at bedtime.      . mirabegron ER (MYRBETRIQ) 50 MG TB24 Take 50 mg by mouth every evening.        BP 130/90  Pulse 93  Temp 98.3 F (36.8 C) (Oral)  Resp 18  Ht 5' 5.25" (1.657 m)  Wt 255 lb (115.667 kg)  BMI 42.11 kg/m2  SpO2 99%    Objective:   Physical Exam  Constitutional: She appears well-developed and well-nourished. No distress.  Cardiovascular: Normal rate and regular rhythm.   No murmur heard. Pulmonary/Chest: Effort normal and breath sounds normal. No respiratory distress. She has no wheezes. She has no rales. She exhibits no tenderness.  Vagina- normal,vaginal dryness is noted. No appreciable discharge, no rashes or lesions.        Assessment & Plan:

## 2012-09-09 NOTE — Assessment & Plan Note (Signed)
Wet prep obtained today.  Await results.

## 2012-09-09 NOTE — Assessment & Plan Note (Signed)
BP looks good today in office. Continue amlodipine and losartan.

## 2012-09-09 NOTE — Patient Instructions (Addendum)
- 

## 2012-09-10 LAB — WET PREP BY MOLECULAR PROBE: Candida species: NEGATIVE

## 2012-09-24 ENCOUNTER — Encounter: Payer: Self-pay | Admitting: Family

## 2012-09-24 ENCOUNTER — Ambulatory Visit (INDEPENDENT_AMBULATORY_CARE_PROVIDER_SITE_OTHER): Payer: Medicare Other | Admitting: Family

## 2012-09-24 VITALS — BP 146/82 | HR 90 | Temp 98.3°F | Resp 16 | Ht 65.25 in | Wt 252.1 lb

## 2012-09-24 DIAGNOSIS — F329 Major depressive disorder, single episode, unspecified: Secondary | ICD-10-CM

## 2012-09-24 DIAGNOSIS — E119 Type 2 diabetes mellitus without complications: Secondary | ICD-10-CM

## 2012-09-24 DIAGNOSIS — Z23 Encounter for immunization: Secondary | ICD-10-CM

## 2012-09-24 DIAGNOSIS — N951 Menopausal and female climacteric states: Secondary | ICD-10-CM

## 2012-09-24 DIAGNOSIS — G2581 Restless legs syndrome: Secondary | ICD-10-CM

## 2012-09-24 DIAGNOSIS — R232 Flushing: Secondary | ICD-10-CM

## 2012-09-24 LAB — HEMOGLOBIN A1C
Hgb A1c MFr Bld: 7.1 % — ABNORMAL HIGH (ref <5.7)
Mean Plasma Glucose: 157 mg/dL — ABNORMAL HIGH (ref <117)

## 2012-09-24 MED ORDER — GABAPENTIN 100 MG PO CAPS: 100.0000 mg | ORAL_CAPSULE | Freq: Three times a day (TID) | ORAL | Status: DC

## 2012-09-24 MED ORDER — ROPINIROLE HCL 4 MG PO TABS: 4.0000 mg | ORAL_TABLET | Freq: Every day | ORAL | Status: DC

## 2012-09-24 NOTE — Assessment & Plan Note (Signed)
Obtain A1C, urine microalbumin. Pneumovax today. Continue ARB/metformin and aspirin.

## 2012-09-24 NOTE — Assessment & Plan Note (Signed)
Worse since estrogen was discontinued.  Will give trial of low dose neurontin.

## 2012-09-24 NOTE — Patient Instructions (Addendum)
Please complete your blood work prior to leaving. Please schedule a follow up appointment in 3 months.  

## 2012-09-24 NOTE — Assessment & Plan Note (Signed)
She reports that this is stable on Requip.  She requests a refill.

## 2012-09-24 NOTE — Progress Notes (Signed)
Subjective:    Patient ID: [name], female    DOB: [birth year], 56 y.o.   MRN: [id number]  HPI  [name] presents today for follow up.    Depression- reports feeling very upset as she recently learned that her lease would not be renewed as she was told that she "didn't seem happy there." She tells me that she needs to be out of her apartment by the end of the month.  Hot flashes-Pt. Reports that she continues to sweat.   Would like to try gabapentin at a low dose to see if this helps.  She reports in the past she has had nausea at the higher doses but tolerated low doses.   Diabetes- reports that she has not been checking her sugar lately.  Reports that she "stays thirsty."  Review of Systems See HPI  Past Medical History  Diagnosis Date  . Diverticulosis   . Restless leg syndrome   . IBS (irritable bowel syndrome)   . IC (interstitial cystitis)   . Hypertension   . Diabetes mellitus     diet controlled  . Depression   . Fibromyalgia   . Anxiety   . Osteopenia   . Arthritis     in knees  . Dyslipidemia   . Chronic fatigue syndrome   . Chronic abdominal pain   . Esophageal reflux     History   Social History  . Marital Status: Divorced    Spouse Name: N/A    Number of Children: N/A  . Years of Education: N/A   Occupational History  . disabled    Social History Main Topics  . Smoking status: Current Every Day Smoker -- 0.1 packs/day for 1 years    Types: Cigarettes  . Smokeless tobacco: Not on file     Comment: using electronic cigarettes  . Alcohol Use: Yes     Comment: 2 drinks per month  . Drug Use: No  . Sexually Active: Not on file   Other Topics Concern  . Not on file   Social History Narrative   DisabledDivorced, lives alone - 1 dtr, 1 son    Past Surgical History  Procedure Date  . Total knee arthroplasty     bilateral  . Tonsillectomy and adenoidectomy   . Breast cyst excision 1978    left  . Laparotomy 1982    cyst removed  from right ovary and removal of adhesions  . Abdominal exploration surgery 1983    diverticulum removed  . Appendectomy 1983    with exploration  . Laparoscopic incisional / umbilical / ventral hernia repair 1991, 2001, 2007    x 3  . Vaginal hysterectomy 1992  . Laparotomy 1991  . Scalp surgery 1993, 1998    Removal of tumor  . Nasal sinus surgery 2004  . Rectocele repair 2009  . Cervical fusion 2010    Family History  Problem Relation Age of Onset  . Breast cancer Mother   . Breast cancer Maternal Grandmother   . Throat cancer Maternal Grandmother   . Esophageal cancer Maternal Grandmother   . Breast cancer Other     maternal great aunts x 5  . Colon cancer Neg Hx   . Diabetes Father   . Diabetes Maternal Grandfather     Allergies  Allergen Reactions  . Chlorzoxazone     unknown  . Gabapentin     nausea  . Gemfibrozil     nausea  . Metronidazole  nausea  . Morphine And Related     vomiting  . Nitrofurantoin     nausea  . Olmesartan Medoxomil-Hctz     nausea  . Ondansetron Nausea And Vomiting  . Pentosan Polysulfate Sodium     unknown  . Propoxyphene-Acetaminophen     nausea  . Ramipril     REACTION: cough  . Sulfonamide Derivatives     nausea    Current Outpatient Prescriptions on File Prior to Visit  Medication Sig Dispense Refill  . ALPRAZolam (XANAX) 1 MG tablet Take 1 mg by mouth 3 (three) times daily as needed. For anxiety      . amLODipine (NORVASC) 5 MG tablet Take 5 mg by mouth at bedtime.      Marland Kitchen aspirin EC 325 MG tablet Take 325 mg by mouth daily.      . cyclobenzaprine (FLEXERIL) 10 MG tablet Take 10-20 mg by mouth 3 (three) times daily as needed. For pain      . desvenlafaxine (PRISTIQ) 50 MG 24 hr tablet Take 150 mg by mouth daily.      Marland Kitchen dicyclomine (BENTYL) 20 MG tablet Take 20 mg by mouth 2 (two) times daily as needed. Irration bowels      . fentaNYL (DURAGESIC) 100 MCG/HR Place 1 patch onto the skin every 2 days      . fluticasone  (FLONASE) 50 MCG/ACT nasal spray Place 2 sprays into the nose daily.  16 g  3  . furosemide (LASIX) 20 MG tablet TAKE ONE TABLET BY MOUTH DAILY  30 tablet  5  . losartan (COZAAR) 50 MG tablet Take 50 mg by mouth at bedtime.      . metFORMIN (GLUCOPHAGE XR) 500 MG 24 hr tablet Take 2 tablets (1,000 mg total) by mouth daily with breakfast.  60 tablet  11  . mirabegron ER (MYRBETRIQ) 50 MG TB24 Take 50 mg by mouth every evening.      Marland Kitchen NEXIUM 40 MG capsule TAKE ONE BY MOUTH AT BEDTIME  30 capsule  5  . oxyCODONE (OXY IR/ROXICODONE) 5 MG immediate release tablet Take 5 mg by mouth 3 (three) times daily.      . promethazine (PHENERGAN) 25 MG tablet Take 25 mg by mouth at bedtime. Patient takes every day per patient      . risperiDONE (RISPERDAL) 0.5 MG tablet Take 1 tablet by mouth daily.      Marland Kitchen topiramate (TOPAMAX) 50 MG tablet Take 50 mg by mouth daily.      . traMADol (ULTRAM) 50 MG tablet Take 50 mg by mouth every 6 (six) hours as needed.      . traZODone (DESYREL) 150 MG tablet Take 150 mg by mouth at bedtime.      . [DISCONTINUED] rOPINIRole (REQUIP) 4 MG tablet TAKE 1 TABLET AT BEDTIME  30 tablet  5  . gabapentin (NEURONTIN) 100 MG capsule Take 1 capsule (100 mg total) by mouth 3 (three) times daily.  90 capsule  3    BP 146/82  Pulse 90  Temp 98.3 F (36.8 C) (Oral)  Resp 16  Ht 5' 5.25" (1.657 m)  Wt 252 lb 1.3 oz (114.343 kg)  BMI 41.63 kg/m2  SpO2 96%       Objective:   Physical Exam  Constitutional: She appears well-developed and well-nourished. No distress.  Cardiovascular: Normal rate and regular rhythm.   No murmur heard. Pulmonary/Chest: Effort normal and breath sounds normal. No respiratory distress. She has no wheezes. She has  no rales. She exhibits no tenderness.  Psychiatric: Judgment and thought content normal. Cognition and memory are normal.       Tearful upon discussion of her housing problems.          Assessment & Plan:

## 2012-09-24 NOTE — Assessment & Plan Note (Signed)
Continue current meds.  Support provided in regards to her current housing issues.

## 2012-09-25 LAB — MICROALBUMIN / CREATININE URINE RATIO
Microalb Creat Ratio: 7.1 mg/g (ref 0.0–30.0)
Microalb, Ur: 1.2 mg/dL (ref 0.00–1.89)

## 2012-09-26 ENCOUNTER — Telehealth: Payer: Self-pay | Admitting: Family

## 2012-09-26 MED ORDER — METFORMIN HCL ER 500 MG PO TB24: 1500.0000 mg | ORAL_TABLET | Freq: Every day | ORAL | Status: DC

## 2012-09-26 NOTE — Telephone Encounter (Signed)
Notified pt and she voices understanding. 

## 2012-09-26 NOTE — Telephone Encounter (Signed)
Pls let pt know A1C slightly above goal at 7.1.  I would like for her to increase her glucophage xr from 1000mg  (2 tabs) to 1500mg  (3 tabs once daily) please and work hard on diet, exercise, weight loss.

## 2012-10-02 ENCOUNTER — Other Ambulatory Visit: Payer: Self-pay | Admitting: *Deleted

## 2012-10-02 MED ORDER — ESOMEPRAZOLE MAGNESIUM 40 MG PO CPDR: 40.0000 mg | DELAYED_RELEASE_CAPSULE | Freq: Every day | ORAL | Status: DC

## 2012-10-02 NOTE — Telephone Encounter (Signed)
Received message from pt requesting refill of nexium. Refill sent, #30 x 3 refills. Notified pt.

## 2012-11-10 ENCOUNTER — Other Ambulatory Visit: Payer: Self-pay | Admitting: *Deleted

## 2012-11-10 MED ORDER — AMLODIPINE BESYLATE 5 MG PO TABS: 5.0000 mg | ORAL_TABLET | Freq: Every day | ORAL | Status: DC

## 2012-11-10 NOTE — Telephone Encounter (Signed)
Received message from pt requesting refill of amlodipine 5mg  qhs. Pt requested that pharmacy be changed from [facility] [address] [facility] [address]. Refills sent. Pharmacy changed.

## 2012-11-20 ENCOUNTER — Emergency Department (HOSPITAL_BASED_OUTPATIENT_CLINIC_OR_DEPARTMENT_OTHER): Payer: Medicare Other

## 2012-11-20 ENCOUNTER — Encounter (HOSPITAL_BASED_OUTPATIENT_CLINIC_OR_DEPARTMENT_OTHER): Payer: Self-pay | Admitting: *Deleted

## 2012-11-20 ENCOUNTER — Emergency Department (HOSPITAL_BASED_OUTPATIENT_CLINIC_OR_DEPARTMENT_OTHER)
Admission: EM | Admit: 2012-11-20 | Discharge: 2012-11-20 | Disposition: A | Discharge status: Home / Self Care | Payer: Medicare Other | Attending: Emergency Medicine | Admitting: Emergency Medicine

## 2012-11-20 DIAGNOSIS — G2581 Restless legs syndrome: Secondary | ICD-10-CM | POA: Insufficient documentation

## 2012-11-20 DIAGNOSIS — K219 Gastro-esophageal reflux disease without esophagitis: Secondary | ICD-10-CM | POA: Insufficient documentation

## 2012-11-20 DIAGNOSIS — I10 Essential (primary) hypertension: Secondary | ICD-10-CM | POA: Insufficient documentation

## 2012-11-20 DIAGNOSIS — E119 Type 2 diabetes mellitus without complications: Secondary | ICD-10-CM | POA: Insufficient documentation

## 2012-11-20 DIAGNOSIS — Z7982 Long term (current) use of aspirin: Secondary | ICD-10-CM | POA: Insufficient documentation

## 2012-11-20 DIAGNOSIS — Z8719 Personal history of other diseases of the digestive system: Secondary | ICD-10-CM | POA: Insufficient documentation

## 2012-11-20 DIAGNOSIS — Z8742 Personal history of other diseases of the female genital tract: Secondary | ICD-10-CM | POA: Insufficient documentation

## 2012-11-20 DIAGNOSIS — F411 Generalized anxiety disorder: Secondary | ICD-10-CM | POA: Insufficient documentation

## 2012-11-20 DIAGNOSIS — F329 Major depressive disorder, single episode, unspecified: Secondary | ICD-10-CM | POA: Insufficient documentation

## 2012-11-20 DIAGNOSIS — M899 Disorder of bone, unspecified: Secondary | ICD-10-CM | POA: Insufficient documentation

## 2012-11-20 DIAGNOSIS — R109 Unspecified abdominal pain: Secondary | ICD-10-CM | POA: Insufficient documentation

## 2012-11-20 DIAGNOSIS — IMO0001 Reserved for inherently not codable concepts without codable children: Secondary | ICD-10-CM | POA: Insufficient documentation

## 2012-11-20 DIAGNOSIS — M129 Arthropathy, unspecified: Secondary | ICD-10-CM | POA: Insufficient documentation

## 2012-11-20 DIAGNOSIS — G8929 Other chronic pain: Secondary | ICD-10-CM | POA: Insufficient documentation

## 2012-11-20 DIAGNOSIS — F3289 Other specified depressive episodes: Secondary | ICD-10-CM | POA: Insufficient documentation

## 2012-11-20 DIAGNOSIS — Z79899 Other long term (current) drug therapy: Secondary | ICD-10-CM | POA: Insufficient documentation

## 2012-11-20 DIAGNOSIS — M542 Cervicalgia: Secondary | ICD-10-CM | POA: Insufficient documentation

## 2012-11-20 DIAGNOSIS — F17200 Nicotine dependence, unspecified, uncomplicated: Secondary | ICD-10-CM | POA: Insufficient documentation

## 2012-11-20 MED ORDER — CYCLOBENZAPRINE HCL 10 MG PO TABS: 10.0000 mg | ORAL_TABLET | Freq: Two times a day (BID) | ORAL | Status: DC | PRN

## 2012-11-20 NOTE — ED Provider Notes (Signed)
History     CSN: [id number]  Arrival date & time 11/20/12  1128   First MD Initiated Contact with Patient 11/20/12 1140      No chief complaint on file.   (Consider location/radiation/quality/duration/timing/severity/associated sxs/prior treatment) HPI  Patient fell from sitting on the edge of cough and slipped and fell into sitting position jarring her neck one week ago.  Patient spoke with her doctor's office and told to come to ed.  Taking her oxycodone as usual for her interstitial cystitis and fibromyalgia.  She also has a fentanyl patch.  These are prescribed by pain clinic here, Mr. Konrad Penta.  Patient has her pain meds and is taking.  Pain is in bilateral neck right greater than left.  No numbness, weakness, or tingling.  Denies visual changes, no head trauma or pain.    Past Medical History  Diagnosis Date  . Diverticulosis   . Restless leg syndrome   . IBS (irritable bowel syndrome)   . IC (interstitial cystitis)   . Hypertension   . Diabetes mellitus     diet controlled  . Depression   . Fibromyalgia   . Anxiety   . Osteopenia   . Arthritis     in knees  . Dyslipidemia   . Chronic fatigue syndrome   . Chronic abdominal pain   . Esophageal reflux     Past Surgical History  Procedure Date  . Total knee arthroplasty     bilateral  . Tonsillectomy and adenoidectomy   . Breast cyst excision 1978    left  . Laparotomy 1982    cyst removed from right ovary and removal of adhesions  . Abdominal exploration surgery 1983    diverticulum removed  . Appendectomy 1983    with exploration  . Laparoscopic incisional / umbilical / ventral hernia repair 1991, 2001, 2007    x 3  . Vaginal hysterectomy 1992  . Laparotomy 1991  . Scalp surgery 1993, 1998    Removal of tumor  . Nasal sinus surgery 2004  . Rectocele repair 2009  . Cervical fusion 2010    Family History  Problem Relation Age of Onset  . Breast cancer Mother   . Breast cancer Maternal  Grandmother   . Throat cancer Maternal Grandmother   . Esophageal cancer Maternal Grandmother   . Breast cancer Other     maternal great aunts x 5  . Colon cancer Neg Hx   . Diabetes Father   . Diabetes Maternal Grandfather     History  Substance Use Topics  . Smoking status: Current Every Day Smoker -- 0.1 packs/day for 1 years    Types: Cigarettes  . Smokeless tobacco: Not on file     Comment: using electronic cigarettes  . Alcohol Use: Yes     Comment: 2 drinks per month    OB History    Grav Para Term Preterm Abortions TAB SAB Ect Mult Living                  Review of Systems  All other systems reviewed and are negative.    Allergies  Chlorzoxazone; Gabapentin; Gemfibrozil; Metronidazole; Morphine and related; Nitrofurantoin; Olmesartan medoxomil-hctz; Ondansetron; Pentosan polysulfate sodium; Propoxyphene-acetaminophen; Ramipril; and Sulfonamide derivatives  Home Medications   Current Outpatient Rx  Name  Route  Sig  Dispense  Refill  . ALPRAZOLAM 1 MG PO TABS   Oral   Take 1 mg by mouth 3 (three) times daily as needed. For anxiety         .  AMLODIPINE BESYLATE 5 MG PO TABS   Oral   Take 1 tablet (5 mg total) by mouth at bedtime.   30 tablet   2   . ASPIRIN EC 325 MG PO TBEC   Oral   Take 325 mg by mouth daily.         . CYCLOBENZAPRINE HCL 10 MG PO TABS   Oral   Take 10-20 mg by mouth 3 (three) times daily as needed. For pain         . DESVENLAFAXINE SUCCINATE ER 50 MG PO TB24   Oral   Take 150 mg by mouth daily.         Marland Kitchen DICYCLOMINE HCL 20 MG PO TABS   Oral   Take 20 mg by mouth 2 (two) times daily as needed. Irration bowels         . ESOMEPRAZOLE MAGNESIUM 40 MG PO CPDR   Oral   Take 1 capsule (40 mg total) by mouth daily before breakfast.   30 capsule   3   . FENTANYL 100 MCG/HR TD PT72      Place 1 patch onto the skin every 2 days         . FLUTICASONE PROPIONATE 50 MCG/ACT NA SUSP   Nasal   Place 2 sprays into the  nose daily.   16 g   3   . FUROSEMIDE 20 MG PO TABS      TAKE ONE TABLET BY MOUTH DAILY   30 tablet   5   . GABAPENTIN 100 MG PO CAPS   Oral   Take 1 capsule (100 mg total) by mouth 3 (three) times daily.   90 capsule   3   . LOSARTAN POTASSIUM 50 MG PO TABS   Oral   Take 50 mg by mouth at bedtime.         Marland Kitchen METFORMIN HCL ER 500 MG PO TB24   Oral   Take 3 tablets (1,500 mg total) by mouth daily with breakfast.   90 tablet   2   . MIRABEGRON ER 50 MG PO TB24   Oral   Take 50 mg by mouth every evening.         . OXYCODONE HCL 5 MG PO TABS   Oral   Take 5 mg by mouth 3 (three) times daily.         Marland Kitchen PROMETHAZINE HCL 25 MG PO TABS   Oral   Take 25 mg by mouth at bedtime. Patient takes every day per patient         . RISPERIDONE 0.5 MG PO TABS   Oral   Take 1 tablet by mouth daily.         Marland Kitchen ROPINIROLE HCL 4 MG PO TABS   Oral   Take 1 tablet (4 mg total) by mouth at bedtime.   30 tablet   5   . TOPIRAMATE 50 MG PO TABS   Oral   Take 50 mg by mouth daily.         . TRAMADOL HCL 50 MG PO TABS   Oral   Take 50 mg by mouth every 6 (six) hours as needed.         . TRAZODONE HCL 150 MG PO TABS   Oral   Take 150 mg by mouth at bedtime.           There were no vitals taken for this visit.  Physical Exam  Nursing note  and vitals reviewed. Constitutional: She is oriented to person, place, and time. She appears well-developed.       obese  HENT:  Head: Normocephalic and atraumatic.  Right Ear: External ear normal.  Left Ear: External ear normal.  Eyes: Conjunctivae normal are normal. Pupils are equal, round, and reactive to light.  Neck: Normal range of motion. No tracheal deviation present.       Right paracervical tenderness  Cardiovascular: Normal rate, regular rhythm and normal heart sounds.   Pulmonary/Chest: Effort normal and breath sounds normal.  Abdominal: Soft. Bowel sounds are normal.  Musculoskeletal: Normal range of motion.    Neurological: She is alert and oriented to person, place, and time. She has normal strength and normal reflexes. No sensory deficit. She displays a negative Romberg sign. GCS eye subscore is 4. GCS verbal subscore is 5. GCS motor subscore is 6.  Skin: Skin is warm and dry.    ED Course  Procedures (including critical care time)  Labs Reviewed - No data to display No results found.   No diagnosis found.    MDM  Ct Cervical Spine Wo Contrast  11/20/2012  *RADIOLOGY REPORT*  Clinical Data: Fall with neck injury.  Prior surgery of C5-6.  Neck pain and stiffness.  CT CERVICAL SPINE WITHOUT CONTRAST  Technique:  Multidetector CT imaging of the cervical spine was performed. Multiplanar CT image reconstructions were also generated.  Comparison: Multiple exams, including 02/24/2009  Findings: Anterior cervical plate screw fixation noted at C5-6 with solid interbody bony fusion.  No significant vertebral subluxation.  Mild facet arthropathy noted bilaterally at C3-4, C4-5, and C5-6. There is moderate osseous foraminal stenosis on the right at C4-5 due to uncinate and facet spurring.  There is likely mild left foraminal stenosis at C6-7 due to facet spurring.  A small ossicle or spur is present along the superior aspect of the right neural foramen at this level, but does not appear be causing overt osseous foraminal stenosis.  No prevertebral soft tissue swelling noted.  No fracture is identified.  A 0.7 x 0.3 x 0.2 cm ossific fragment is present in the left temporal mandibular joint.  IMPRESSION:  1.  Solid interbody fusion at C5-6. 2.  Moderate osseous foraminal stenosis on the right at C4-5 and potentially mild left foraminal stenosis at C6-7 due to spurring. 3.  No fracture or acute subluxation observed. 4.  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   Original Report Authenticated By: Gaylyn Rong, M.D.      Patient with neck pain after fall one week ago. She has no  acute focal neurological deficits. She is on chronic pain management with fentanyl patches and oxycodone. Also takes Xanax. She states that she has had to stop her Flexeril because her insurance company refused to pay for this. Patient advised that we can restore her Flexeril for short-term pain therapy. She has had a previous cervical fusion by Dr. Dutch Quint. She is advised to followup with her primary care Dr. and Dr. Toni Arthurs the pain persists    Hilario Quarry, MD 11/20/12 1258

## 2012-11-20 NOTE — ED Notes (Signed)
States she slipped off the edge of her couch landing on her buttocks over a week ago. Pain in her neck since that time. Hx of neck surgery. Ambulatory from triage to treatment room. She had a friend drop her off here. Goes to a pain clinic in [place] and has Oxycodone and uses a Fentanyl patch without relief of pain.

## 2012-12-12 ENCOUNTER — Other Ambulatory Visit: Payer: Self-pay | Admitting: *Deleted

## 2012-12-12 MED ORDER — LOSARTAN POTASSIUM 50 MG PO TABS: 50.0000 mg | ORAL_TABLET | Freq: Every day | ORAL | Status: DC

## 2012-12-12 NOTE — Telephone Encounter (Signed)
Received message from pt requesting refill of losartan. Notified pt.

## 2012-12-22 DIAGNOSIS — F339 Major depressive disorder, recurrent, unspecified: Secondary | ICD-10-CM | POA: Insufficient documentation

## 2012-12-23 ENCOUNTER — Ambulatory Visit: Payer: Medicare Other | Admitting: Family

## 2012-12-30 ENCOUNTER — Encounter: Payer: Self-pay | Admitting: Family

## 2012-12-30 ENCOUNTER — Ambulatory Visit (INDEPENDENT_AMBULATORY_CARE_PROVIDER_SITE_OTHER): Payer: Medicare Other | Admitting: Family

## 2012-12-30 VITALS — BP 136/86 | HR 78 | Temp 98.0°F | Resp 16 | Ht 65.25 in | Wt 260.0 lb

## 2012-12-30 DIAGNOSIS — E119 Type 2 diabetes mellitus without complications: Secondary | ICD-10-CM

## 2012-12-30 DIAGNOSIS — F329 Major depressive disorder, single episode, unspecified: Secondary | ICD-10-CM

## 2012-12-30 DIAGNOSIS — N3010 Interstitial cystitis (chronic) without hematuria: Secondary | ICD-10-CM

## 2012-12-30 DIAGNOSIS — N951 Menopausal and female climacteric states: Secondary | ICD-10-CM

## 2012-12-30 DIAGNOSIS — R232 Flushing: Secondary | ICD-10-CM

## 2012-12-30 LAB — BASIC METABOLIC PANEL
CO2: 30 mEq/L (ref 19–32)
Calcium: 9.6 mg/dL (ref 8.4–10.5)
Creat: 0.83 mg/dL (ref 0.50–1.10)

## 2012-12-30 LAB — HEMOGLOBIN A1C: Hgb A1c MFr Bld: 7.1 % — ABNORMAL HIGH (ref <5.7)

## 2012-12-30 NOTE — Patient Instructions (Addendum)
Please complete your lab work prior to leaving. Schedule follow up with psychiatry. Please schedule a follow up appointment in 3 months.

## 2012-12-30 NOTE — Progress Notes (Signed)
Subjective:    Patient ID: [name], female    DOB: 09-May-1956, 57 y.o.   MRN: [id number]  HPI  [name] is a 57 yr old female who presents today for follow up.  1) Depression- she reports wanting to stay in bed all the time. She continues to follow with psychiatry, Dr. Lurlean Nanny.  She sees her therapist next week.    2) DM2- has not been checking sugars at home.  She continues metformin.  She had a flu shot this season.  Her last eye exam was >1 yr ago.   She will arrange appointment.   3) IC- Had hydrodistension of her bladder along with botox injection.    4) Hot flashes-  Reports that this is much better. She feels that the gabapentin is helping.   Review of Systems    see hpi  Past Medical History  Diagnosis Date  . Diverticulosis   . Restless leg syndrome   . IBS (irritable bowel syndrome)   . IC (interstitial cystitis)   . Hypertension   . Diabetes mellitus     diet controlled  . Depression   . Fibromyalgia   . Anxiety   . Osteopenia   . Arthritis     in knees  . Dyslipidemia   . Chronic fatigue syndrome   . Chronic abdominal pain   . Esophageal reflux     History   Social History  . Marital Status: Divorced    Spouse Name: N/A    Number of Children: N/A  . Years of Education: N/A   Occupational History  . disabled    Social History Main Topics  . Smoking status: Former Smoker -- 0.10 packs/day for 1 years    Types: Cigarettes    Quit date: 10/19/2012  . Smokeless tobacco: Not on file     Comment: using electronic cigarettes  . Alcohol Use: Yes     Comment: 2 drinks per month  . Drug Use: No  . Sexually Active: Not on file   Other Topics Concern  . Not on file   Social History Narrative   Disabled   Divorced, lives alone - 1 dtr, 1 son    Past Surgical History  Procedure Laterality Date  . Total knee arthroplasty      bilateral  . Tonsillectomy and adenoidectomy    . Breast cyst excision  1978    left  . Laparotomy  1982    cyst removed from right ovary and removal of adhesions  . Abdominal exploration surgery  1983    diverticulum removed  . Appendectomy  1983    with exploration  . Laparoscopic incisional / umbilical / ventral hernia repair  1991, 2001, 2007    x 3  . Vaginal hysterectomy  1992  . Laparotomy  1991  . Scalp surgery  1993, 1998    Removal of tumor  . Nasal sinus surgery  2004  . Rectocele repair  2009  . Cervical fusion  2010    Family History  Problem Relation Age of Onset  . Breast cancer Mother   . Breast cancer Maternal Grandmother   . Throat cancer Maternal Grandmother   . Esophageal cancer Maternal Grandmother   . Breast cancer Other     maternal great aunts x 5  . Colon cancer Neg Hx   . Diabetes Father   . Diabetes Maternal Grandfather     Allergies  Allergen Reactions  . Chlorzoxazone  unknown  . Gabapentin     nausea  . Gemfibrozil     nausea  . Metronidazole     nausea  . Morphine And Related     vomiting  . Nitrofurantoin     nausea  . Olmesartan Medoxomil-Hctz     nausea  . Ondansetron Nausea And Vomiting  . Pentosan Polysulfate Sodium     unknown  . Propoxyphene-Acetaminophen     nausea  . Ramipril     REACTION: cough  . Sulfonamide Derivatives     nausea    Current Outpatient Prescriptions on File Prior to Visit  Medication Sig Dispense Refill  . ALPRAZolam (XANAX) 1 MG tablet Take 1 mg by mouth 3 (three) times daily as needed. For anxiety      . amLODipine (NORVASC) 5 MG tablet Take 1 tablet (5 mg total) by mouth at bedtime.  30 tablet  2  . aspirin EC 325 MG tablet Take 325 mg by mouth daily.      Marland Kitchen desvenlafaxine (PRISTIQ) 50 MG 24 hr tablet Take 150 mg by mouth daily.      Marland Kitchen dicyclomine (BENTYL) 20 MG tablet Take 20 mg by mouth 2 (two) times daily as needed. Irration bowels      . esomeprazole (NEXIUM) 40 MG capsule Take 1 capsule (40 mg total) by mouth daily before breakfast.  30 capsule  3  . fentaNYL (DURAGESIC) 100 MCG/HR Place 1  patch onto the skin every 2 days      . fluticasone (FLONASE) 50 MCG/ACT nasal spray Place 2 sprays into the nose daily.  16 g  3  . furosemide (LASIX) 20 MG tablet TAKE ONE TABLET BY MOUTH DAILY  30 tablet  5  . gabapentin (NEURONTIN) 100 MG capsule Take 1 capsule (100 mg total) by mouth 3 (three) times daily.  90 capsule  3  . losartan (COZAAR) 50 MG tablet Take 1 tablet (50 mg total) by mouth at bedtime.  30 tablet  2  . mirabegron ER (MYRBETRIQ) 50 MG TB24 Take 50 mg by mouth every evening.      Marland Kitchen oxyCODONE (OXY IR/ROXICODONE) 5 MG immediate release tablet Take 5 mg by mouth 3 (three) times daily.      . promethazine (PHENERGAN) 25 MG tablet Take 25 mg by mouth at bedtime. Patient takes every day per patient      . risperiDONE (RISPERDAL) 0.5 MG tablet Take 1 tablet by mouth daily.      Marland Kitchen rOPINIRole (REQUIP) 4 MG tablet Take 1 tablet (4 mg total) by mouth at bedtime.  30 tablet  5  . traZODone (DESYREL) 150 MG tablet Take 150 mg by mouth at bedtime.      . topiramate (TOPAMAX) 50 MG tablet Take 50 mg by mouth daily.      . traMADol (ULTRAM) 50 MG tablet Take 50 mg by mouth every 6 (six) hours as needed.       No current facility-administered medications on file prior to visit.    BP 136/86  Pulse 78  Temp(Src) 98 F (36.7 C) (Oral)  Resp 16  Ht 5' 5.25" (1.657 m)  Wt 260 lb (117.935 kg)  BMI 42.95 kg/m2  SpO2 97%    Objective:   Physical Exam  Constitutional: She is oriented to person, place, and time. She appears well-developed and well-nourished. No distress.  HENT:  Head: Normocephalic and atraumatic.  Cardiovascular: Normal rate and regular rhythm.   No murmur heard. Pulmonary/Chest: Effort normal  and breath sounds normal. No respiratory distress. She has no wheezes. She has no rales. She exhibits no tenderness.  Musculoskeletal: She exhibits no edema.  Lymphadenopathy:    She has no cervical adenopathy.  Neurological: She is alert and oriented to person, place, and  time.  Psychiatric: She has a normal mood and affect. Her behavior is normal. Judgment and thought content normal.          Assessment & Plan:

## 2012-12-31 ENCOUNTER — Telehealth: Payer: Self-pay | Admitting: Family

## 2012-12-31 NOTE — Telephone Encounter (Signed)
A1C is 7.1.  Increase glucophage xr to 4 tabs (2000mg  once daily).  Continue to work on diabetic diet, exercise.

## 2013-01-02 NOTE — Assessment & Plan Note (Signed)
Improved with gabapentin. Cont same.

## 2013-01-02 NOTE — Assessment & Plan Note (Signed)
Improved following her procedure. Management per urology.

## 2013-01-02 NOTE — Assessment & Plan Note (Signed)
Check A1c. 

## 2013-01-02 NOTE — Assessment & Plan Note (Signed)
Uncontrolled. denies SI. Recommended follow up with psychiaty and therapist.

## 2013-01-05 NOTE — Telephone Encounter (Signed)
Notified pt and she voices understanding. 

## 2013-01-12 ENCOUNTER — Other Ambulatory Visit: Payer: Self-pay | Admitting: Family

## 2013-02-01 ENCOUNTER — Other Ambulatory Visit: Payer: Self-pay | Admitting: Family

## 2013-02-02 NOTE — Telephone Encounter (Signed)
Rx to pharmacy/[facility] 

## 2013-02-17 ENCOUNTER — Other Ambulatory Visit: Payer: Self-pay | Admitting: Family

## 2013-02-23 ENCOUNTER — Telehealth: Payer: Self-pay | Admitting: *Deleted

## 2013-02-23 ENCOUNTER — Encounter: Payer: Self-pay | Admitting: Family

## 2013-02-23 MED ORDER — METFORMIN HCL ER 500 MG PO TB24: 2000.0000 mg | ORAL_TABLET | Freq: Every day | ORAL | Status: DC

## 2013-02-23 NOTE — Telephone Encounter (Signed)
Received message from pt stating she called in a refill of metformin and directions were not correct. Still said three tablets daily and per 12/31/12 phone note it was increased to 4 tablets daily. Rx re-sent with corrected directions and quantity. Pt has been made aware.

## 2013-02-26 NOTE — Telephone Encounter (Signed)
Received call from pt stating she went to pick up corrrect Rx and pharmacy gave her the rx with the old directions again. Spoke with Bernerd at [facility] and they verified receipt of 02/23/13 Rx  For 4 tablets daily. He states they put the rx on file as pt had just picked up rx with old directions but pt may bring old rx back. Notified pt and she states she has already taken some of the medication and will finish her current supply then get corrected Rx. Advised pt to speak with the pharmacist when she needs to refill medication and not to call it in from her current Rx# to ensure she gets the Rx with the proper directions.

## 2013-03-04 ENCOUNTER — Telehealth: Payer: Self-pay | Admitting: *Deleted

## 2013-03-04 NOTE — Telephone Encounter (Signed)
[facility] Triage Call Report Triage Record Num: [id number] Operator: Tomasita Crumble Patient Name: [name] Call Date & Time: 03/03/2013 5:18:04PM Patient Phone: [phone] PCP: Sandford Craze, NP Patient Gender: Female PCP Fax : [phone] Patient DOB: 03-19-1956 Practice Name: [facility] Reason for Call: Caller: [name]; PCP: Peggyann Juba, Melissa (Adults only); CB#: [phone]; Call regarding Vomiting onset 03/02/13. Estimated 8 episodes. Took Phenergan oral tablet on 03/02/13 pm and suppository on 03/03/13. C/o headache that came on gradually. FSBG 174 during call. ED disposition per Diabetes: Gastrointestinal Problems protocol due to repeated vomiting reported. Caller agreed. Advised [place]. Protocol(s) Used: Diabetes: Gastrointestinal Problems Protocol(s) Used: Nausea or Vomiting Recommended Outcome per Protocol: See ED Immediately Reason for Outcome: Known diabetic Vomiting multiple times OR unable to keep fluids down for more than 2 hours Care Advice: ~ Another adult should drive. ~ Check blood sugar. ~ If available, bring recent log of blood sugars or bring blood glucose monitor with log of blood sugars. Dehydration can affect blood sugar levels. Drink water during transport and while waiting to see a provider. If vomiting, take sips of water or suck on ice chips. ~ Write down provider's name. List or place the following in a bag for transport with the patient: current prescription

## 2013-03-08 ENCOUNTER — Other Ambulatory Visit: Payer: Self-pay | Admitting: Family

## 2013-03-10 ENCOUNTER — Ambulatory Visit (INDEPENDENT_AMBULATORY_CARE_PROVIDER_SITE_OTHER): Payer: Medicare Other | Admitting: Family

## 2013-03-10 ENCOUNTER — Encounter: Payer: Self-pay | Admitting: Family

## 2013-03-10 VITALS — BP 136/96 | HR 89 | Temp 98.8°F | Resp 18

## 2013-03-10 DIAGNOSIS — A088 Other specified intestinal infections: Secondary | ICD-10-CM

## 2013-03-10 DIAGNOSIS — R05 Cough: Secondary | ICD-10-CM

## 2013-03-10 DIAGNOSIS — A084 Viral intestinal infection, unspecified: Secondary | ICD-10-CM

## 2013-03-10 DIAGNOSIS — R112 Nausea with vomiting, unspecified: Secondary | ICD-10-CM

## 2013-03-10 DIAGNOSIS — R079 Chest pain, unspecified: Secondary | ICD-10-CM

## 2013-03-10 DIAGNOSIS — F329 Major depressive disorder, single episode, unspecified: Secondary | ICD-10-CM

## 2013-03-10 LAB — BASIC METABOLIC PANEL
BUN: 13 mg/dL (ref 6–23)
CO2: 31 mEq/L (ref 19–32)
Calcium: 9.8 mg/dL (ref 8.4–10.5)
Chloride: 98 mEq/L (ref 96–112)
Creat: 0.82 mg/dL (ref 0.50–1.10)

## 2013-03-10 LAB — CBC WITH DIFFERENTIAL/PLATELET
Basophils Absolute: 0 10*3/uL (ref 0.0–0.1)
Basophils Relative: 0 % (ref 0–1)
Eosinophils Absolute: 0.1 10*3/uL (ref 0.0–0.7)
MCH: 28.8 pg (ref 26.0–34.0)
MCHC: 34.1 g/dL (ref 30.0–36.0)
Neutrophils Relative %: 63 % (ref 43–77)
Platelets: 223 10*3/uL (ref 150–400)

## 2013-03-10 LAB — HEPATIC FUNCTION PANEL
AST: 22 U/L (ref 0–37)
Albumin: 4.4 g/dL (ref 3.5–5.2)
Total Bilirubin: 0.4 mg/dL (ref 0.3–1.2)

## 2013-03-10 NOTE — Assessment & Plan Note (Signed)
Symptoms most consistent with viral gastroenteritis. I have asked to pt to let me know if her symptoms worsen or if they do not continue to improve over the next few days.

## 2013-03-10 NOTE — Patient Instructions (Addendum)
Complete lab work prior to leaving.  Make sure that you are drinking plenty of water.   Call if your symptoms are not improved in 2-3 days. Go ER if you develop severe or worsening chest pain. Follow up in 1 month.

## 2013-03-10 NOTE — Progress Notes (Signed)
Subjective:    Patient ID: [name], female    DOB: Aug 01, 1956, 57 y.o.   MRN: [id number]  HPI  [name] is a 57 yr old female who presents today with several medical conerns:  1) Nausea/vomitting- symptoms started on 4/14.  She continues to have nausea, but has not vomitted in 2 days.  She reports that she has had associated diarrhea.  Stools are loose but not watery at this time.  She denies abdominal pain.  Reports some low grade fever at home.  Last night was first day without fever.  She denies sick contacts.    2) Cough- Pt reports 1 week hx of cough.  Mild.  She reports that she had some chest heaviness.  She reports that this discomfort is improved today but not resolved. Reports weakness. Denies SOB.    3) Tingling-Pt reports a 1 week hx of "tingling all over." describes a pins and needle senstation.  4) Depression- she is on higher dose of prisiq which is helping.    Review of Systems See HPI  Past Medical History  Diagnosis Date  . Diverticulosis   . Restless leg syndrome   . IBS (irritable bowel syndrome)   . IC (interstitial cystitis)   . Hypertension   . Diabetes mellitus     diet controlled  . Depression   . Fibromyalgia   . Anxiety   . Osteopenia   . Arthritis     in knees  . Dyslipidemia   . Chronic fatigue syndrome   . Chronic abdominal pain   . Esophageal reflux     History   Social History  . Marital Status: Divorced    Spouse Name: N/A    Number of Children: N/A  . Years of Education: N/A   Occupational History  . disabled    Social History Main Topics  . Smoking status: Former Smoker -- 0.10 packs/day for 1 years    Types: Cigarettes    Quit date: 10/19/2012  . Smokeless tobacco: Not on file     Comment: using electronic cigarettes  . Alcohol Use: Yes     Comment: 2 drinks per month  . Drug Use: No  . Sexually Active: Not on file   Other Topics Concern  . Not on file   Social History Narrative   Disabled   Divorced,  lives alone - 1 dtr, 1 son    Past Surgical History  Procedure Laterality Date  . Total knee arthroplasty      bilateral  . Tonsillectomy and adenoidectomy    . Breast cyst excision  1978    left  . Laparotomy  1982    cyst removed from right ovary and removal of adhesions  . Abdominal exploration surgery  1983    diverticulum removed  . Appendectomy  1983    with exploration  . Laparoscopic incisional / umbilical / ventral hernia repair  1991, 2001, 2007    x 3  . Vaginal hysterectomy  1992  . Laparotomy  1991  . Scalp surgery  1993, 1998    Removal of tumor  . Nasal sinus surgery  2004  . Rectocele repair  2009  . Cervical fusion  2010    Family History  Problem Relation Age of Onset  . Breast cancer Mother   . Breast cancer Maternal Grandmother   . Throat cancer Maternal Grandmother   . Esophageal cancer Maternal Grandmother   . Breast cancer Other  maternal great aunts x 5  . Colon cancer Neg Hx   . Diabetes Father   . Diabetes Maternal Grandfather     Allergies  Allergen Reactions  . Chlorzoxazone     unknown  . Gabapentin     nausea  . Gemfibrozil     nausea  . Metronidazole     nausea  . Morphine And Related     vomiting  . Nitrofurantoin     nausea  . Olmesartan Medoxomil-Hctz     nausea  . Ondansetron Nausea And Vomiting  . Pentosan Polysulfate Sodium     unknown  . Propoxyphene-Acetaminophen     nausea  . Ramipril     REACTION: cough  . Sulfonamide Derivatives     nausea    Current Outpatient Prescriptions on File Prior to Visit  Medication Sig Dispense Refill  . ALPRAZolam (XANAX) 1 MG tablet Take 1 mg by mouth 3 (three) times daily as needed. For anxiety      . amLODipine (NORVASC) 5 MG tablet TAKE 1 TABLET (5 MG TOTAL) BY MOUTH AT BEDTIME.  30 tablet  2  . aspirin EC 325 MG tablet Take 325 mg by mouth daily.      Marland Kitchen desvenlafaxine (PRISTIQ) 50 MG 24 hr tablet Take 150 mg by mouth daily.      Marland Kitchen dicyclomine (BENTYL) 20 MG tablet  Take 20 mg by mouth 2 (two) times daily as needed. Irration bowels      . fentaNYL (DURAGESIC) 100 MCG/HR Place 1 patch onto the skin every 2 days      . fluticasone (FLONASE) 50 MCG/ACT nasal spray Place 2 sprays into the nose daily.  16 g  3  . furosemide (LASIX) 20 MG tablet TAKE ONE TABLET BY MOUTH DAILY  30 tablet  5  . gabapentin (NEURONTIN) 100 MG capsule Take 1 capsule (100 mg total) by mouth 3 (three) times daily.  90 capsule  3  . losartan (COZAAR) 50 MG tablet TAKE 1 TABLET (50 MG TOTAL) BY MOUTH AT BEDTIME.  30 tablet  2  . metFORMIN (GLUCOPHAGE-XR) 500 MG 24 hr tablet Take 4 tablets (2,000 mg total) by mouth daily with breakfast.  120 tablet  2  . mirabegron ER (MYRBETRIQ) 50 MG TB24 Take 50 mg by mouth every evening.      Marland Kitchen NEXIUM 40 MG capsule TAKE 1 CAPSULE (40 MG TOTAL) BY MOUTH DAILY BEFORE BREAKFAST.  30 capsule  2  . oxyCODONE (OXY IR/ROXICODONE) 5 MG immediate release tablet Take 5 mg by mouth 3 (three) times daily.      . promethazine (PHENERGAN) 25 MG tablet Take 25 mg by mouth at bedtime. Patient takes every day per patient      . risperiDONE (RISPERDAL) 0.5 MG tablet Take 1 tablet by mouth daily.      Marland Kitchen rOPINIRole (REQUIP) 4 MG tablet Take 1 tablet (4 mg total) by mouth at bedtime.  30 tablet  5  . traZODone (DESYREL) 150 MG tablet Take 150 mg by mouth at bedtime.       No current facility-administered medications on file prior to visit.    BP 136/96  Pulse 89  Temp(Src) 98.8 F (37.1 C) (Oral)  Resp 18  SpO2 98%       Objective:   Physical Exam  Constitutional: She is oriented to person, place, and time. She appears well-developed and well-nourished. No distress.  HENT:  Head: Normocephalic and atraumatic.  Cardiovascular: Normal rate and regular  rhythm.   No murmur heard. Pulmonary/Chest: Effort normal and breath sounds normal. No respiratory distress. She has no wheezes. She has no rales. She exhibits no tenderness.  Lymphadenopathy:    She has no  cervical adenopathy.  Neurological: She is alert and oriented to person, place, and time.  Psychiatric: She has a normal mood and affect. Her behavior is normal. Judgment and thought content normal.          Assessment & Plan:

## 2013-03-10 NOTE — Assessment & Plan Note (Signed)
I suspect that cough is related to allergic rhinitis and that chest discomfort is related to cough and recent vomitting. EKG is performed today and is unremarkable without acute changes.  Due to multiple risk factors for CAD, will refer for stress test.

## 2013-03-10 NOTE — Assessment & Plan Note (Signed)
Improved on higher dose of pristiq.  Management per psychiatry. I am not sure what to make of her complaint of "pins and needles everywhere."  Maybe due to changes in medication?  She reports that these symptoms are resolved. Monitor.

## 2013-03-13 ENCOUNTER — Encounter: Payer: Self-pay | Admitting: Family

## 2013-03-16 ENCOUNTER — Encounter: Payer: Self-pay | Admitting: Family

## 2013-03-17 ENCOUNTER — Ambulatory Visit (HOSPITAL_COMMUNITY): Payer: Medicare Other | Attending: Cardiology | Admitting: Radiology

## 2013-03-17 VITALS — BP 104/45 | Ht 65.0 in | Wt 257.0 lb

## 2013-03-17 DIAGNOSIS — R5383 Other fatigue: Secondary | ICD-10-CM | POA: Insufficient documentation

## 2013-03-17 DIAGNOSIS — R0602 Shortness of breath: Secondary | ICD-10-CM | POA: Insufficient documentation

## 2013-03-17 DIAGNOSIS — R0989 Other specified symptoms and signs involving the circulatory and respiratory systems: Secondary | ICD-10-CM | POA: Insufficient documentation

## 2013-03-17 DIAGNOSIS — R079 Chest pain, unspecified: Secondary | ICD-10-CM | POA: Insufficient documentation

## 2013-03-17 DIAGNOSIS — R5381 Other malaise: Secondary | ICD-10-CM | POA: Insufficient documentation

## 2013-03-17 DIAGNOSIS — E119 Type 2 diabetes mellitus without complications: Secondary | ICD-10-CM | POA: Insufficient documentation

## 2013-03-17 DIAGNOSIS — R112 Nausea with vomiting, unspecified: Secondary | ICD-10-CM | POA: Insufficient documentation

## 2013-03-17 DIAGNOSIS — I10 Essential (primary) hypertension: Secondary | ICD-10-CM | POA: Insufficient documentation

## 2013-03-17 DIAGNOSIS — Z87891 Personal history of nicotine dependence: Secondary | ICD-10-CM | POA: Insufficient documentation

## 2013-03-17 DIAGNOSIS — R0609 Other forms of dyspnea: Secondary | ICD-10-CM | POA: Insufficient documentation

## 2013-03-17 DIAGNOSIS — E785 Hyperlipidemia, unspecified: Secondary | ICD-10-CM | POA: Insufficient documentation

## 2013-03-17 HISTORY — PX: CARDIOVASCULAR STRESS TEST: SHX262

## 2013-03-17 MED ORDER — TECHNETIUM TC 99M SESTAMIBI GENERIC - CARDIOLITE
33.0000 | Freq: Once | INTRAVENOUS | Status: AC | PRN
  Administered 2013-03-17: 33 via INTRAVENOUS

## 2013-03-17 MED ORDER — REGADENOSON 0.4 MG/5ML IV SOLN
0.4000 mg | Freq: Once | INTRAVENOUS | Status: AC
  Administered 2013-03-17: 0.4 mg via INTRAVENOUS

## 2013-03-17 NOTE — Progress Notes (Signed)
[facility] SITE 3 NUCLEAR MED [address] [place], [place] [address] [phone]    Cardiology Nuclear Med Study  [name] is a 57 y.o. female     MRN : [id number]     DOB: Sep 14, 1956  Procedure Date: 03/17/2013  Nuclear Med Background Indication for Stress Test:  Evaluation for Ischemia History:  08/2012 ECHO: EF: 60-65%  Cardiac Risk Factors: History of Smoking, Hypertension, Lipids and NIDDM  Symptoms:  Chest Pain, DOE, Fatigue, Nausea, SOB and Vomiting   Nuclear Pre-Procedure Caffeine/Decaff Intake:  None NPO After: 7:30pm   Lungs:  clear O2 Sat: 95% on room air. IV 0.9% NS with Angio Cath:  22g  IV Site: R Antecubital  IV Started by:  Doyne Keel, CNMT  Chest Size (in):  42 Cup Size: DD  Height: 5\' 5"  (1.651 m)  Weight:  257 lb (116.574 kg)  BMI:  Body mass index is 42.77 kg/(m^2). Tech Comments:  Held metformin and lasix this am; took all other am meds, CBG 176 mg/dl The patient was given Aminophylline 75 mg IV, and got total relief of symptoms.     Nuclear Med Study 1 or 2 day study: 2 day  Stress Test Type:  Eugenie Birks  Reading MD: Cassell Clement, MD  Order Authorizing Provider:  S. Abner Greenspan, MD; Evonnie Dawes, NP  Resting Radionuclide: Technetium 20m Sestamibi  Resting Radionuclide Dose: 33.0 mCi on 03/19/13   Stress Radionuclide:  Technetium 75m Sestamibi  Stress Radionuclide Dose: 33.0 mCi on 03/17/13           Stress Protocol Rest HR: 77 Stress HR: 93  Rest BP: 104/45 Stress BP: 150/67  Exercise Time (min): n/a METS: n/a   Predicted Max HR: 165 bpm % Max HR: 56.36 bpm Rate Pressure Product: 21308   Dose of Adenosine (mg):  n/a Dose of Lexiscan: 0.4 mg  Dose of Atropine (mg): n/a Dose of Dobutamine: n/a mcg/kg/min (at max HR)  Stress Test Technologist: Milana Na, EMT-P  Nuclear Technologist:  Domenic Polite, CNMT     Rest Procedure:  Myocardial perfusion imaging was performed at rest 45 minutes following the intravenous  administration of Technetium 26m Sestamibi. Rest ECG: NSR - Normal EKG  Stress Procedure:  The patient received IV Lexiscan 0.4 mg over 15-seconds.  Technetium 54m Sestamibi injected at 30-seconds. This patient was sob, lt.leaded, nausea, hot and weakness with the Lexiscan injection.  Quantitative spect images were obtained after a 45 minute delay. Stress ECG: No significant change from baseline ECG  QPS Raw Data Images:  Normal; no motion artifact; normal heart/lung ratio. Stress Images:  Normal homogeneous uptake in all areas of the myocardium. Rest Images:  Normal homogeneous uptake in all areas of the myocardium. Subtraction (SDS):  No evidence of ischemia. Transient Ischemic Dilatation (Normal <1.22):  1.05 Lung/Heart Ratio (Normal <0.45):  0.29  Quantitative Gated Spect Images QGS EDV:  104 ml QGS ESV:  40 ml  Impression Exercise Capacity:  Lexiscan with no exercise. BP Response:  Normal blood pressure response. Clinical Symptoms:  No chest pain. ECG Impression:  No significant ST segment change suggestive of ischemia. Comparison with Prior Nuclear Study: No images to compare  Overall Impression:  Normal stress nuclear study.  LV Ejection Fraction: 62%.  LV Wall Motion:  NL LV Function; NL Wall Motion  [employer]

## 2013-03-19 ENCOUNTER — Ambulatory Visit (HOSPITAL_COMMUNITY): Payer: Medicare Other | Attending: Cardiology

## 2013-03-19 DIAGNOSIS — R0989 Other specified symptoms and signs involving the circulatory and respiratory systems: Secondary | ICD-10-CM

## 2013-03-19 MED ORDER — TECHNETIUM TC 99M SESTAMIBI GENERIC - CARDIOLITE
33.0000 | Freq: Once | INTRAVENOUS | Status: AC | PRN
  Administered 2013-03-19: 33 via INTRAVENOUS

## 2013-03-23 ENCOUNTER — Telehealth: Payer: Self-pay | Admitting: *Deleted

## 2013-03-23 NOTE — Telephone Encounter (Signed)
Stress test is normal

## 2013-03-23 NOTE — Telephone Encounter (Signed)
Pt left message requesting stress test results done last week. States she is anxious for results.  Requests return call to her home # first then her cell #.

## 2013-03-23 NOTE — Telephone Encounter (Signed)
Notified pt. 

## 2013-03-24 ENCOUNTER — Encounter: Payer: Self-pay | Admitting: *Deleted

## 2013-03-31 ENCOUNTER — Encounter: Payer: Self-pay | Admitting: Family

## 2013-03-31 ENCOUNTER — Ambulatory Visit (INDEPENDENT_AMBULATORY_CARE_PROVIDER_SITE_OTHER): Payer: Medicare Other | Admitting: Family

## 2013-03-31 ENCOUNTER — Ambulatory Visit (HOSPITAL_BASED_OUTPATIENT_CLINIC_OR_DEPARTMENT_OTHER)
Admission: RE | Admit: 2013-03-31 | Discharge: 2013-03-31 | Disposition: A | Discharge status: Home / Self Care | Payer: Medicare Other | Source: Ambulatory Visit | Attending: Family | Admitting: Family

## 2013-03-31 VITALS — BP 120/74 | HR 72 | Temp 98.1°F | Resp 16 | Ht 65.25 in | Wt 263.1 lb

## 2013-03-31 DIAGNOSIS — R7309 Other abnormal glucose: Secondary | ICD-10-CM

## 2013-03-31 DIAGNOSIS — R1011 Right upper quadrant pain: Secondary | ICD-10-CM | POA: Insufficient documentation

## 2013-03-31 DIAGNOSIS — I10 Essential (primary) hypertension: Secondary | ICD-10-CM

## 2013-03-31 DIAGNOSIS — F329 Major depressive disorder, single episode, unspecified: Secondary | ICD-10-CM

## 2013-03-31 DIAGNOSIS — R935 Abnormal findings on diagnostic imaging of other abdominal regions, including retroperitoneum: Secondary | ICD-10-CM | POA: Insufficient documentation

## 2013-03-31 DIAGNOSIS — R739 Hyperglycemia, unspecified: Secondary | ICD-10-CM

## 2013-03-31 DIAGNOSIS — E669 Obesity, unspecified: Secondary | ICD-10-CM | POA: Insufficient documentation

## 2013-03-31 DIAGNOSIS — Z87442 Personal history of urinary calculi: Secondary | ICD-10-CM | POA: Insufficient documentation

## 2013-03-31 DIAGNOSIS — E119 Type 2 diabetes mellitus without complications: Secondary | ICD-10-CM

## 2013-03-31 DIAGNOSIS — R109 Unspecified abdominal pain: Secondary | ICD-10-CM | POA: Insufficient documentation

## 2013-03-31 DIAGNOSIS — R110 Nausea: Secondary | ICD-10-CM | POA: Insufficient documentation

## 2013-03-31 DIAGNOSIS — E785 Hyperlipidemia, unspecified: Secondary | ICD-10-CM

## 2013-03-31 DIAGNOSIS — K7689 Other specified diseases of liver: Secondary | ICD-10-CM | POA: Insufficient documentation

## 2013-03-31 LAB — LIPID PANEL
LDL Cholesterol: 85 mg/dL (ref 0–99)
Triglycerides: 146 mg/dL (ref <150)
VLDL: 29 mg/dL (ref 0–40)

## 2013-03-31 LAB — HEMOGLOBIN A1C: Hgb A1c MFr Bld: 6.8 % — ABNORMAL HIGH (ref <5.7)

## 2013-03-31 LAB — HEPATIC FUNCTION PANEL
ALT: 26 U/L (ref 0–35)
AST: 20 U/L (ref 0–37)
Albumin: 4.1 g/dL (ref 3.5–5.2)
Alkaline Phosphatase: 66 U/L (ref 39–117)
Total Bilirubin: 0.6 mg/dL (ref 0.3–1.2)

## 2013-03-31 NOTE — Assessment & Plan Note (Signed)
Clinically stable. Check A1C.  

## 2013-03-31 NOTE — Patient Instructions (Addendum)
Please complete your lab work prior to leaving.  Complete your ultrasound on the first floor. Call if abdominal discomfort worsens or if symptoms do not improve.  Follow up in 3 months.

## 2013-03-31 NOTE — Progress Notes (Signed)
Subjective:    Patient ID: [name], female    DOB: 01-Feb-1956, 57 y.o.   MRN: [id number]  HPI  [name] is a 57 yr old female who presents today for follow up.  1) DM2- She is due for A1C. Last A1C 7.1.  Maintained on metformin. She reports that she checks sugars occasionally- last time was 176.    2) Depression- [facility]- Dr. Lucky Cowboy.  She also sees a therapist at [facility].  Report that her depression is currently well controlled.    3) HTN- pt continues amlodipine, lasix, losartan.  Denie LE edema.   4) abdominal pain- reports intermittent right sided abdominal pain and tightness x 3 months.  Recently, symptoms have been worsening. Describes pain "like a muscle cramp over the right rib cage." Reports + post prandial nausea. Describes "grabbing pain" with movement. Thinks that it is "adhesions. "    Review of Systems    see HPI  Past Medical History  Diagnosis Date  . Diverticulosis   . Restless leg syndrome   . IBS (irritable bowel syndrome)   . IC (interstitial cystitis)   . Hypertension   . Diabetes mellitus     diet controlled  . Depression   . Fibromyalgia   . Anxiety   . Osteopenia   . Arthritis     in knees  . Dyslipidemia   . Chronic fatigue syndrome   . Chronic abdominal pain   . Esophageal reflux     History   Social History  . Marital Status: Divorced    Spouse Name: N/A    Number of Children: N/A  . Years of Education: N/A   Occupational History  . disabled    Social History Main Topics  . Smoking status: Former Smoker -- 0.10 packs/day for 1 years    Types: Cigarettes    Quit date: 10/19/2012  . Smokeless tobacco: Not on file     Comment: using electronic cigarettes  . Alcohol Use: Yes     Comment: 2 drinks per month  . Drug Use: No  . Sexually Active: Not on file   Other Topics Concern  . Not on file   Social History Narrative   Disabled   Divorced, lives alone - 1 dtr, 1 son     Past Surgical History  Procedure Laterality Date  . Total knee arthroplasty      bilateral  . Tonsillectomy and adenoidectomy    . Breast cyst excision  1978    left  . Laparotomy  1982    cyst removed from right ovary and removal of adhesions  . Abdominal exploration surgery  1983    diverticulum removed  . Appendectomy  1983    with exploration  . Laparoscopic incisional / umbilical / ventral hernia repair  1991, 2001, 2007    x 3  . Vaginal hysterectomy  1992  . Laparotomy  1991  . Scalp surgery  1993, 1998    Removal of tumor  . Nasal sinus surgery  2004  . Rectocele repair  2009  . Cervical fusion  2010    Family History  Problem Relation Age of Onset  . Breast cancer Mother   . Breast cancer Maternal Grandmother   . Throat cancer Maternal Grandmother   . Esophageal cancer Maternal Grandmother   . Breast cancer Other     maternal great aunts x 5  . Colon cancer Neg Hx   . Diabetes  Father   . Diabetes Maternal Grandfather     Allergies  Allergen Reactions  . Chlorzoxazone     unknown  . Gabapentin     nausea  . Gemfibrozil     nausea  . Metronidazole     nausea  . Morphine And Related     vomiting  . Nitrofurantoin     nausea  . Olmesartan Medoxomil-Hctz     nausea  . Ondansetron Nausea And Vomiting  . Pentosan Polysulfate Sodium     unknown  . Propoxyphene-Acetaminophen     nausea  . Ramipril     REACTION: cough  . Sulfonamide Derivatives     nausea    Current Outpatient Prescriptions on File Prior to Visit  Medication Sig Dispense Refill  . ALPRAZolam (XANAX) 1 MG tablet Take 1 mg by mouth 3 (three) times daily as needed. For anxiety      . amLODipine (NORVASC) 5 MG tablet TAKE 1 TABLET (5 MG TOTAL) BY MOUTH AT BEDTIME.  30 tablet  2  . aspirin EC 325 MG tablet Take 325 mg by mouth daily.      Marland Kitchen desvenlafaxine (PRISTIQ) 50 MG 24 hr tablet Take 150 mg by mouth daily.      Marland Kitchen dicyclomine (BENTYL) 20 MG tablet Take 20 mg by mouth 2 (two)  times daily as needed. Irration bowels      . fentaNYL (DURAGESIC) 100 MCG/HR Place 1 patch onto the skin every 2 days      . fluticasone (FLONASE) 50 MCG/ACT nasal spray Place 2 sprays into the nose daily.  16 g  3  . furosemide (LASIX) 20 MG tablet TAKE ONE TABLET BY MOUTH DAILY  30 tablet  5  . gabapentin (NEURONTIN) 100 MG capsule Take 1 capsule (100 mg total) by mouth 3 (three) times daily.  90 capsule  3  . losartan (COZAAR) 50 MG tablet TAKE 1 TABLET (50 MG TOTAL) BY MOUTH AT BEDTIME.  30 tablet  2  . metFORMIN (GLUCOPHAGE-XR) 500 MG 24 hr tablet Take 4 tablets (2,000 mg total) by mouth daily with breakfast.  120 tablet  2  . mirabegron ER (MYRBETRIQ) 50 MG TB24 Take 50 mg by mouth every evening.      Marland Kitchen NEXIUM 40 MG capsule TAKE 1 CAPSULE (40 MG TOTAL) BY MOUTH DAILY BEFORE BREAKFAST.  30 capsule  2  . oxyCODONE (OXY IR/ROXICODONE) 5 MG immediate release tablet Take 5 mg by mouth 3 (three) times daily.      . promethazine (PHENERGAN) 25 MG tablet Take 25 mg by mouth at bedtime. Patient takes every day per patient      . risperiDONE (RISPERDAL) 0.5 MG tablet Take 1 tablet by mouth daily.      Marland Kitchen rOPINIRole (REQUIP) 4 MG tablet Take 1 tablet (4 mg total) by mouth at bedtime.  30 tablet  5  . tiZANidine (ZANAFLEX) 4 MG tablet Take 4 mg by mouth at bedtime.      . traZODone (DESYREL) 150 MG tablet Take 150 mg by mouth at bedtime.       No current facility-administered medications on file prior to visit.    BP 120/74  Pulse 72  Temp(Src) 98.1 F (36.7 C) (Oral)  Resp 16  Ht 5' 5.25" (1.657 m)  Wt 263 lb 1.9 oz (119.35 kg)  BMI 43.47 kg/m2  SpO2 99%    Objective:   Physical Exam  Constitutional: She is oriented to person, place, and time. She appears  well-developed and well-nourished. No distress.  HENT:  Head: Normocephalic and atraumatic.  Cardiovascular: Normal rate and regular rhythm.   No murmur heard. Pulmonary/Chest: Effort normal and breath sounds normal. No respiratory  distress. She has no wheezes. She has no rales. She exhibits no tenderness.  Abdominal: Soft. Bowel sounds are normal. She exhibits no distension and no mass. There is no tenderness. There is no rebound and no guarding.  Neurological: She is alert and oriented to person, place, and time.  Psychiatric: She has a normal mood and affect. Her behavior is normal. Judgment and thought content normal.          Assessment & Plan:

## 2013-03-31 NOTE — Assessment & Plan Note (Signed)
Benign exam. Check LFT, abdominal US.  Post prandial nausea may be related to gastroparesis versus secondary to narcotics.

## 2013-03-31 NOTE — Assessment & Plan Note (Signed)
Stable, management per psych 

## 2013-03-31 NOTE — Assessment & Plan Note (Signed)
BP stable.  Continue current meds.   BP Readings from Last 3 Encounters:  03/31/13 120/74  03/17/13 104/45  03/10/13 136/96

## 2013-04-28 ENCOUNTER — Encounter: Payer: Self-pay | Admitting: Family

## 2013-04-28 ENCOUNTER — Ambulatory Visit (INDEPENDENT_AMBULATORY_CARE_PROVIDER_SITE_OTHER): Payer: Medicare Other | Admitting: Family

## 2013-04-28 VITALS — BP 150/98 | HR 76 | Temp 97.8°F | Resp 16 | Wt 252.1 lb

## 2013-04-28 DIAGNOSIS — N390 Urinary tract infection, site not specified: Secondary | ICD-10-CM

## 2013-04-28 LAB — POCT URINALYSIS DIPSTICK
Bilirubin, UA: NEGATIVE
Glucose, UA: NEGATIVE
Nitrite, UA: POSITIVE
Urobilinogen, UA: 0.2
pH, UA: 6

## 2013-04-28 MED ORDER — CEFUROXIME AXETIL 500 MG PO TABS: 500.0000 mg | ORAL_TABLET | Freq: Two times a day (BID) | ORAL | Status: DC

## 2013-04-28 NOTE — Patient Instructions (Addendum)
Please call if symptoms worsen or if not improved in 2-3 days. Return in 1 month to lab for repeat urinalysis.

## 2013-04-28 NOTE — Assessment & Plan Note (Addendum)
Urinalysis is notable for moderat blood, nitrites, leuks.  Plan to send rx for culture and rx with ceftin. Hematuria on dip.  Pt instructed to return in 1 month for follow up UA with micro.

## 2013-04-28 NOTE — Progress Notes (Signed)
Subjective:    Patient ID: [name], female    DOB: 18-Dec-1955, 57 y.o.   MRN: [id number]  HPI  Ms. Goostree is a 57 yr old female who presents today with chief complaint of dysuria.  Symptoms started yesterday.  She reports associated sweats/chills but did not check temperature. She denies associated low back pain.  She reports suprapubic pains.    Review of Systems    see HPI  Past Medical History  Diagnosis Date  . Diverticulosis   . Restless leg syndrome   . IBS (irritable bowel syndrome)   . IC (interstitial cystitis)   . Hypertension   . Diabetes mellitus     diet controlled  . Depression   . Fibromyalgia   . Anxiety   . Osteopenia   . Arthritis     in knees  . Dyslipidemia   . Chronic fatigue syndrome   . Chronic abdominal pain   . Esophageal reflux     History   Social History  . Marital Status: Divorced    Spouse Name: N/A    Number of Children: N/A  . Years of Education: N/A   Occupational History  . disabled    Social History Main Topics  . Smoking status: Former Smoker -- 0.10 packs/day for 1 years    Types: Cigarettes    Quit date: 10/19/2012  . Smokeless tobacco: Not on file     Comment: using electronic cigarettes  . Alcohol Use: Yes     Comment: 2 drinks per month  . Drug Use: No  . Sexually Active: Not on file   Other Topics Concern  . Not on file   Social History Narrative   Disabled   Divorced, lives alone - 1 dtr, 1 son    Past Surgical History  Procedure Laterality Date  . Total knee arthroplasty      bilateral  . Tonsillectomy and adenoidectomy    . Breast cyst excision  1978    left  . Laparotomy  1982    cyst removed from right ovary and removal of adhesions  . Abdominal exploration surgery  1983    diverticulum removed  . Appendectomy  1983    with exploration  . Laparoscopic incisional / umbilical / ventral hernia repair  1991, 2001, 2007    x 3  . Vaginal hysterectomy  1992  . Laparotomy  1991  . Scalp  surgery  1993, 1998    Removal of tumor  . Nasal sinus surgery  2004  . Rectocele repair  2009  . Cervical fusion  2010    Family History  Problem Relation Age of Onset  . Breast cancer Mother   . Breast cancer Maternal Grandmother   . Throat cancer Maternal Grandmother   . Esophageal cancer Maternal Grandmother   . Breast cancer Other     maternal great aunts x 5  . Colon cancer Neg Hx   . Diabetes Father   . Diabetes Maternal Grandfather     Allergies  Allergen Reactions  . Chlorzoxazone     unknown  . Gabapentin     nausea  . Gemfibrozil     nausea  . Metronidazole     nausea  . Morphine And Related     vomiting  . Nitrofurantoin     nausea  . Olmesartan Medoxomil-Hctz     nausea  . Ondansetron Nausea And Vomiting  . Pentosan Polysulfate Sodium     unknown  . Propoxyphene-Acetaminophen  nausea  . Ramipril     REACTION: cough  . Sulfonamide Derivatives     nausea    Current Outpatient Prescriptions on File Prior to Visit  Medication Sig Dispense Refill  . ALPRAZolam (XANAX) 1 MG tablet Take 1 mg by mouth 3 (three) times daily as needed. For anxiety      . amLODipine (NORVASC) 5 MG tablet TAKE 1 TABLET (5 MG TOTAL) BY MOUTH AT BEDTIME.  30 tablet  2  . aspirin EC 325 MG tablet Take 325 mg by mouth daily.      Marland Kitchen desvenlafaxine (PRISTIQ) 50 MG 24 hr tablet Take 150 mg by mouth daily.      Marland Kitchen dicyclomine (BENTYL) 20 MG tablet Take 20 mg by mouth 2 (two) times daily as needed. Irration bowels      . fentaNYL (DURAGESIC) 100 MCG/HR Place 1 patch onto the skin every 2 days      . fluticasone (FLONASE) 50 MCG/ACT nasal spray Place 2 sprays into the nose daily.  16 g  3  . furosemide (LASIX) 20 MG tablet TAKE ONE TABLET BY MOUTH DAILY  30 tablet  5  . gabapentin (NEURONTIN) 100 MG capsule Take 1 capsule (100 mg total) by mouth 3 (three) times daily.  90 capsule  3  . lidocaine (LIDODERM) 5 % Place 1 patch onto the skin at bedtime.      Marland Kitchen losartan (COZAAR) 50 MG  tablet TAKE 1 TABLET (50 MG TOTAL) BY MOUTH AT BEDTIME.  30 tablet  2  . metFORMIN (GLUCOPHAGE-XR) 500 MG 24 hr tablet Take 4 tablets (2,000 mg total) by mouth daily with breakfast.  120 tablet  2  . mirabegron ER (MYRBETRIQ) 50 MG TB24 Take 50 mg by mouth every evening.      Marland Kitchen NEXIUM 40 MG capsule TAKE 1 CAPSULE (40 MG TOTAL) BY MOUTH DAILY BEFORE BREAKFAST.  30 capsule  2  . oxyCODONE (OXY IR/ROXICODONE) 5 MG immediate release tablet Take 5 mg by mouth 3 (three) times daily.      . promethazine (PHENERGAN) 25 MG tablet Take 25 mg by mouth at bedtime. Patient takes every day per patient      . risperiDONE (RISPERDAL) 0.5 MG tablet Take 1 tablet by mouth daily.      Marland Kitchen rOPINIRole (REQUIP) 4 MG tablet Take 1 tablet (4 mg total) by mouth at bedtime.  30 tablet  5  . tiZANidine (ZANAFLEX) 4 MG tablet Take 4 mg by mouth at bedtime.      . traZODone (DESYREL) 150 MG tablet Take 150 mg by mouth at bedtime.       No current facility-administered medications on file prior to visit.    BP 150/98  Pulse 76  Temp(Src) 97.8 F (36.6 C) (Oral)  Resp 16  Wt 252 lb 1.9 oz (114.361 kg)  BMI 41.65 kg/m2  SpO2 98%    Objective:   Physical Exam  Gen: awake, alert, NAd CV:  S1/S2, RRR Resp: BS CTA bilaterally, no W/R/R Abd: suprapubic tenderness without gaurding. GU: + mild L CVAT Psych: calm, pleasant, a and o x 3      Assessment & Plan:

## 2013-05-01 ENCOUNTER — Encounter: Payer: Self-pay | Admitting: Family

## 2013-05-01 LAB — URINE CULTURE: Colony Count: >=100000

## 2013-05-06 ENCOUNTER — Other Ambulatory Visit: Payer: Self-pay | Admitting: Family

## 2013-05-15 ENCOUNTER — Other Ambulatory Visit: Payer: Self-pay | Admitting: Family

## 2013-05-16 ENCOUNTER — Other Ambulatory Visit: Payer: Self-pay | Admitting: Family

## 2013-05-18 NOTE — Telephone Encounter (Signed)
eScribe request for refill on [facility] Last filled - 11.06.13, #[id number] Last OV - 05.13.14 [Acute: 06.10.14] Return - 3-Months [from OV] Please advise on refills/SLS

## 2013-05-18 NOTE — Telephone Encounter (Signed)
Rx request to pharmacy/SLS  

## 2013-05-28 ENCOUNTER — Other Ambulatory Visit: Payer: Self-pay

## 2013-06-07 ENCOUNTER — Other Ambulatory Visit: Payer: Self-pay | Admitting: Family

## 2013-06-08 NOTE — Telephone Encounter (Signed)
Rx request to pharmacy/SLS  

## 2013-06-09 ENCOUNTER — Other Ambulatory Visit: Payer: Self-pay | Admitting: Family

## 2013-06-19 ENCOUNTER — Encounter: Payer: Self-pay | Admitting: Gastroenterology

## 2013-06-19 ENCOUNTER — Ambulatory Visit (INDEPENDENT_AMBULATORY_CARE_PROVIDER_SITE_OTHER): Payer: Medicare Other | Admitting: Gastroenterology

## 2013-06-19 VITALS — BP 146/82 | HR 88 | Ht 65.0 in | Wt 254.4 lb

## 2013-06-19 DIAGNOSIS — K589 Irritable bowel syndrome without diarrhea: Secondary | ICD-10-CM

## 2013-06-19 DIAGNOSIS — R112 Nausea with vomiting, unspecified: Secondary | ICD-10-CM | POA: Insufficient documentation

## 2013-06-19 DIAGNOSIS — R1013 Epigastric pain: Secondary | ICD-10-CM

## 2013-06-19 MED ORDER — DICYCLOMINE HCL 20 MG PO TABS: 20.0000 mg | ORAL_TABLET | Freq: Two times a day (BID) | ORAL | Status: DC | PRN

## 2013-06-19 MED ORDER — ONDANSETRON HCL 4 MG PO TABS: 4.0000 mg | ORAL_TABLET | Freq: Three times a day (TID) | ORAL | Status: DC | PRN

## 2013-06-19 NOTE — Progress Notes (Signed)
History of Present Illness:  57 year old white female IBS, diabetes, fibromyalgia, referred for evaluation of nausea. Nausea seems to be a mild but chronic problem, especially in the evenings. Over the past 2 weeks she's had severe nausea with vomiting. She's also had a worsening of her IBS characterized by frequent postprandial diarrhea. She also complains of some discomfort under the right ribs. Recent ultrasound demonstrated a fatty liver. She's without fever.  The patient is on multiple medications which have not been changed recently.  She lists multiple medications that cause nausea.    Review of Systems: Pertinent positive and negative review of systems were noted in the above HPI section. All other review of systems were otherwise negative.    Current Medications, Allergies, Past Medical History, Past Surgical History, Family History and Social History were reviewed in [employer] electronic medical record  Vital signs were reviewed in today's medical record. Physical Exam: General: she is an obese female in mild distress due to nausea Skin: anicteric Head: Normocephalic and atraumatic Eyes:  sclerae anicteric, EOMI Ears: Normal auditory acuity Mouth: No deformity or lesions Lungs: Clear throughout to auscultation Heart: Regular rate and rhythm; no murmurs, rubs or bruits Abdomen: Soft, non tender and non distended. No masses, hepatosplenomegaly or hernias noted. Normal Bowel sounds. There is no succussion splash Rectal:deferred Musculoskeletal: Symmetrical with no gross deformities  Pulses:  Normal pulses noted Extremities: No clubbing, cyanosis, edema or deformities noted Neurological: Alert oriented x 4, grossly nonfocal Psychological:  Alert and cooperative. Normal mood and affect

## 2013-06-19 NOTE — Assessment & Plan Note (Addendum)
2 week h/o  severe nausea with vomiting, frequent diarrhea could be related to a gastroenteritis although symptoms are prolonged for this. Patient could have underlying gastroparesis.  Recommendations #1 Zofran when necessary #2 gastric emptying scan

## 2013-06-19 NOTE — Patient Instructions (Addendum)
You have been scheduled for a gastric emptying scan at [facility] on 06/26/2013 at 7am. Please arrive at least 15 minutes prior to your appointment for registration. Please make certain not to have anything to eat or drink after midnight the night before your test. Hold all stomach medications (ex: Zofran, phenergan, Reglan) 48 hours prior to your test. If you need to reschedule your appointment, please contact radiology scheduling at [phone]. _____________________________________________________________________ A gastric-emptying study measures how long it takes for food to move through your stomach. There are several ways to measure stomach emptying. In the most common test, you eat food that contains a small amount of radioactive material. A scanner that detects the movement of the radioactive material is placed over your abdomen to monitor the rate at which food leaves your stomach. This test normally takes about 2 hours to complete. _____________________________________________________________________

## 2013-06-20 ENCOUNTER — Other Ambulatory Visit: Payer: Self-pay | Admitting: Family

## 2013-06-22 NOTE — Telephone Encounter (Signed)
Rx request to pharmacy/SLS  

## 2013-06-26 ENCOUNTER — Other Ambulatory Visit (HOSPITAL_COMMUNITY): Payer: Medicare Other

## 2013-06-29 ENCOUNTER — Other Ambulatory Visit: Payer: Self-pay | Admitting: Family

## 2013-06-29 DIAGNOSIS — Z1231 Encounter for screening mammogram for malignant neoplasm of breast: Secondary | ICD-10-CM

## 2013-06-30 ENCOUNTER — Ambulatory Visit: Payer: Medicare Other | Admitting: Family

## 2013-07-01 ENCOUNTER — Encounter: Payer: Self-pay | Admitting: Family

## 2013-07-01 ENCOUNTER — Ambulatory Visit (INDEPENDENT_AMBULATORY_CARE_PROVIDER_SITE_OTHER): Payer: Medicare Other | Admitting: Family

## 2013-07-01 VITALS — BP 126/90 | HR 78 | Temp 98.0°F | Resp 16 | Ht 65.25 in | Wt 252.1 lb

## 2013-07-01 DIAGNOSIS — R509 Fever, unspecified: Secondary | ICD-10-CM

## 2013-07-01 DIAGNOSIS — R112 Nausea with vomiting, unspecified: Secondary | ICD-10-CM

## 2013-07-01 DIAGNOSIS — I10 Essential (primary) hypertension: Secondary | ICD-10-CM

## 2013-07-01 DIAGNOSIS — F329 Major depressive disorder, single episode, unspecified: Secondary | ICD-10-CM

## 2013-07-01 DIAGNOSIS — E119 Type 2 diabetes mellitus without complications: Secondary | ICD-10-CM

## 2013-07-01 DIAGNOSIS — N760 Acute vaginitis: Secondary | ICD-10-CM | POA: Insufficient documentation

## 2013-07-01 LAB — CBC WITH DIFFERENTIAL/PLATELET
Eosinophils Absolute: 0.1 10*3/uL (ref 0.0–0.7)
Eosinophils Relative: 2 % (ref 0–5)
Hemoglobin: 14.1 g/dL (ref 12.0–15.0)
Lymphocytes Relative: 34 % (ref 12–46)
Lymphs Abs: 2.2 10*3/uL (ref 0.7–4.0)
MCH: 28.7 pg (ref 26.0–34.0)
MCV: 83.9 fL (ref 78.0–100.0)
Monocytes Relative: 6 % (ref 3–12)
Neutrophils Relative %: 58 % (ref 43–77)
Platelets: 256 10*3/uL (ref 150–400)
RBC: 4.92 MIL/uL (ref 3.87–5.11)
WBC: 6.5 10*3/uL (ref 4.0–10.5)

## 2013-07-01 MED ORDER — FLUCONAZOLE 150 MG PO TABS: ORAL_TABLET | ORAL | Status: DC

## 2013-07-01 NOTE — Assessment & Plan Note (Signed)
Likely candidiasis.  Rx with diflucan (reports only nausea with metronidazole and prefers to attempt diflucan rather than monistat).

## 2013-07-01 NOTE — Progress Notes (Signed)
Subjective:    Patient ID: [name], female    DOB: Jan 14, 1956, 57 y.o.   MRN: [id number]  HPI  [name] is a 57 yr old female who presents today for follow up of multiple medical problems.  1) Depression- she is currently managed by psychiatry.  Risperdal was discontinued.  She is maintained on xanax, desvenlafaxine, and trazadone. She is following with Dr. Joline Salt at [facility]. She sees Elisha Ponder for therapy.    2) DM2- last A1C 6.8 three months ago.  She is currently maintained on metformin. Reports that her sugars have been as high as 300. Reports that she has cut back on sweets.    3) HTN-  She is currently maintained on losartan and furosemide.    4) Nausea- she was evaluated by GI (dr. Arlyce Dice) who recommended zofran and a gastric emptying study to evaluate pt for gastroparesis. This test is pending.  She reports that she has temp 99.4, as high as 101.4 at home. She reports temp last night 101.  She reports that her fever has been intermittent x 2 weeks. She denies dysuria or frequency.  She reports + vaginal itching.  She denies vaginal discharge.  Denies cough.  Denies sinus congestion.   Review of Systems See HPI  Past Medical History  Diagnosis Date  . Diverticulosis   . Restless leg syndrome   . IBS (irritable bowel syndrome)   . IC (interstitial cystitis)   . Hypertension   . Diabetes mellitus     diet controlled  . Depression   . Fibromyalgia   . Anxiety   . Osteopenia   . Arthritis     in knees  . Dyslipidemia   . Chronic fatigue syndrome   . Chronic abdominal pain   . Esophageal reflux   . TIA (transient ischemic attack) 08/22/2012    History   Social History  . Marital Status: Divorced    Spouse Name: N/A    Number of Children: 2  . Years of Education: N/A   Occupational History  . disabled    Social History Main Topics  . Smoking status: Former Smoker -- 0.10 packs/day for 1 years    Types: Cigarettes    Quit date:  10/19/2012  . Smokeless tobacco: Never Used     Comment: using electronic cigarettes  . Alcohol Use: Yes     Comment: 2 drinks per month  . Drug Use: No  . Sexual Activity: Not on file   Other Topics Concern  . Not on file   Social History Narrative   Disabled   Divorced, lives alone - 1 dtr, 1 son    Past Surgical History  Procedure Laterality Date  . Total knee arthroplasty      bilateral  . Tonsillectomy and adenoidectomy    . Breast cyst excision  1978    left  . Laparotomy  1982    cyst removed from right ovary and removal of adhesions  . Abdominal exploration surgery  1983    diverticulum removed  . Appendectomy  1983    with exploration  . Laparoscopic incisional / umbilical / ventral hernia repair  1991, 2001, 2007    x 3  . Vaginal hysterectomy  1992  . Laparotomy  1991  . Scalp surgery  1993, 1998    Removal of tumor  . Nasal sinus surgery  2004  . Rectocele repair  2009  . Cervical fusion  2010  . Bladder hydrodistention  Family History  Problem Relation Age of Onset  . Breast cancer Mother   . Breast cancer Maternal Grandmother   . Throat cancer Maternal Grandmother   . Esophageal cancer Maternal Grandmother   . Breast cancer Other     maternal great aunts x 5  . Colon cancer Neg Hx   . Diabetes Father   . Diabetes Maternal Grandfather     Allergies  Allergen Reactions  . Chlorzoxazone     unknown  . Gabapentin     nausea  . Gemfibrozil     nausea  . Metronidazole     nausea  . Morphine And Related     vomiting  . Nitrofurantoin     nausea  . Olmesartan Medoxomil-Hctz     nausea  . Ondansetron Nausea And Vomiting  . Pentosan Polysulfate Sodium     unknown  . Propoxyphene-Acetaminophen     nausea  . Ramipril     REACTION: cough  . Sulfonamide Derivatives     nausea    Current Outpatient Prescriptions on File Prior to Visit  Medication Sig Dispense Refill  . ALPRAZolam (XANAX) 1 MG tablet Take 1 mg by mouth 3 (three)  times daily as needed. For anxiety      . amLODipine (NORVASC) 5 MG tablet TAKE 1 TABLET (5 MG TOTAL) BY MOUTH AT BEDTIME.  30 tablet  2  . aspirin EC 325 MG tablet Take 325 mg by mouth daily.      Marland Kitchen desvenlafaxine (PRISTIQ) 50 MG 24 hr tablet Take 150 mg by mouth daily.      Marland Kitchen dicyclomine (BENTYL) 20 MG tablet Take 1 tablet (20 mg total) by mouth 2 (two) times daily as needed. Irration bowels  30 tablet  2  . fentaNYL (DURAGESIC) 100 MCG/HR Place 1 patch onto the skin every 2 days      . fluticasone (FLONASE) 50 MCG/ACT nasal spray Place 2 sprays into the nose daily.  16 g  3  . furosemide (LASIX) 20 MG tablet TAKE ONE TABLET BY MOUTH DAILY  30 tablet  5  . gabapentin (NEURONTIN) 100 MG capsule TAKE 1 CAPSULE (100 MG TOTAL) BY MOUTH 3 (THREE) TIMES DAILY.  90 capsule  3  . lidocaine (LIDODERM) 5 % Place 1 patch onto the skin at bedtime.      Marland Kitchen losartan (COZAAR) 50 MG tablet TAKE 1 TABLET (50 MG TOTAL) BY MOUTH AT BEDTIME.  30 tablet  2  . metFORMIN (GLUCOPHAGE-XR) 500 MG 24 hr tablet TAKE 4 TABLETS (2,000 MG TOTAL) BY MOUTH DAILY WITH BREAKFAST.  120 tablet  2  . mirabegron ER (MYRBETRIQ) 50 MG TB24 Take 50 mg by mouth every evening.      Marland Kitchen NEXIUM 40 MG capsule TAKE 1 CAPSULE (40 MG TOTAL) BY MOUTH DAILY BEFORE BREAKFAST.  30 capsule  2  . oxyCODONE (OXY IR/ROXICODONE) 5 MG immediate release tablet Take 5 mg by mouth 3 (three) times daily.      Marland Kitchen POTASSIUM GLUCONATE PO Take 1 tablet by mouth as needed.      . promethazine (PHENERGAN) 25 MG tablet Take 25 mg by mouth at bedtime. Patient takes every day per patient      . rOPINIRole (REQUIP) 4 MG tablet TAKE 1 TABLET (4 MG TOTAL) BY MOUTH AT BEDTIME.  30 tablet  3  . tiZANidine (ZANAFLEX) 4 MG tablet Take 4 mg by mouth at bedtime.      . traZODone (DESYREL) 150 MG tablet Take  150 mg by mouth at bedtime.       No current facility-administered medications on file prior to visit.    BP 126/90  Pulse 78  Temp(Src) 98 F (36.7 C) (Oral)  Resp  16  Ht 5' 5.25" (1.657 m)  Wt 252 lb 1.3 oz (114.343 kg)  BMI 41.65 kg/m2  SpO2 99%       Objective:   Physical Exam  Constitutional: She is oriented to person, place, and time. She appears well-developed and well-nourished. No distress.  HENT:  Head: Normocephalic and atraumatic.  Cardiovascular: Normal rate and regular rhythm.   No murmur heard. Pulmonary/Chest: Effort normal and breath sounds normal. No respiratory distress. She has no wheezes. She has no rales. She exhibits no tenderness.  Abdominal: Soft. Bowel sounds are normal. She exhibits no distension and no mass. There is no tenderness. There is no rebound and no guarding.  Genitourinary:  Neg CVAT bilaterally  Neurological: She is alert and oriented to person, place, and time.  Psychiatric: She has a normal mood and affect. Her behavior is normal. Judgment and thought content normal.          Assessment & Plan:

## 2013-07-01 NOTE — Assessment & Plan Note (Signed)
No recent vomiting. Work up ongoing per GI, await gastric emptying study.

## 2013-07-01 NOTE — Assessment & Plan Note (Signed)
Stable, management per psychiatry.  

## 2013-07-01 NOTE — Assessment & Plan Note (Signed)
No clear etiology.  Afebrile today.  Obtain CBC, urine culture to further evaluate.

## 2013-07-01 NOTE — Assessment & Plan Note (Signed)
BP stable on current meds. Continue same.  

## 2013-07-01 NOTE — Patient Instructions (Addendum)
Please complete your lab work prior to leaving. Call if new symptoms. Follow up in 2 weeks.

## 2013-07-01 NOTE — Assessment & Plan Note (Signed)
Deteriorated per pt report.  Obtain A1c.  Adjust meds pending results of A1C.

## 2013-07-02 LAB — URINALYSIS, ROUTINE W REFLEX MICROSCOPIC
Bilirubin Urine: NEGATIVE
Hgb urine dipstick: NEGATIVE
Protein, ur: NEGATIVE mg/dL
Urobilinogen, UA: 0.2 mg/dL (ref 0.0–1.0)

## 2013-07-02 LAB — URINALYSIS, MICROSCOPIC ONLY
Casts: NONE SEEN
Crystals: NONE SEEN

## 2013-07-03 ENCOUNTER — Encounter: Payer: Self-pay | Admitting: Family

## 2013-07-03 LAB — URINE CULTURE: Colony Count: 40000

## 2013-07-06 ENCOUNTER — Ambulatory Visit (HOSPITAL_BASED_OUTPATIENT_CLINIC_OR_DEPARTMENT_OTHER): Payer: Medicare Other

## 2013-07-07 ENCOUNTER — Encounter (HOSPITAL_COMMUNITY)
Admission: RE | Admit: 2013-07-07 | Discharge: 2013-07-07 | Disposition: A | Discharge status: Home / Self Care | Payer: Medicare Other | Source: Ambulatory Visit | Attending: Gastroenterology | Admitting: Gastroenterology

## 2013-07-07 DIAGNOSIS — K3189 Other diseases of stomach and duodenum: Secondary | ICD-10-CM | POA: Insufficient documentation

## 2013-07-07 DIAGNOSIS — R1013 Epigastric pain: Secondary | ICD-10-CM

## 2013-07-07 DIAGNOSIS — R112 Nausea with vomiting, unspecified: Secondary | ICD-10-CM | POA: Insufficient documentation

## 2013-07-07 MED ORDER — TECHNETIUM TC 99M SULFUR COLLOID
2.0000 | Freq: Once | INTRAVENOUS | Status: AC | PRN
  Administered 2013-07-07: 2 via ORAL

## 2013-07-13 DIAGNOSIS — R5382 Chronic fatigue, unspecified: Secondary | ICD-10-CM | POA: Insufficient documentation

## 2013-07-14 ENCOUNTER — Encounter: Payer: Self-pay | Admitting: Family

## 2013-07-14 ENCOUNTER — Ambulatory Visit (HOSPITAL_BASED_OUTPATIENT_CLINIC_OR_DEPARTMENT_OTHER)
Admission: RE | Admit: 2013-07-14 | Discharge: 2013-07-14 | Disposition: A | Discharge status: Home / Self Care | Payer: Medicare Other | Source: Ambulatory Visit | Attending: Family | Admitting: Family

## 2013-07-14 ENCOUNTER — Ambulatory Visit (INDEPENDENT_AMBULATORY_CARE_PROVIDER_SITE_OTHER): Payer: Medicare Other | Admitting: Family

## 2013-07-14 VITALS — BP 130/88 | HR 92 | Temp 97.4°F | Resp 16 | Ht 65.25 in | Wt 253.0 lb

## 2013-07-14 DIAGNOSIS — R509 Fever, unspecified: Secondary | ICD-10-CM | POA: Insufficient documentation

## 2013-07-14 DIAGNOSIS — K3189 Other diseases of stomach and duodenum: Secondary | ICD-10-CM

## 2013-07-14 DIAGNOSIS — K30 Functional dyspepsia: Secondary | ICD-10-CM

## 2013-07-14 DIAGNOSIS — R109 Unspecified abdominal pain: Secondary | ICD-10-CM

## 2013-07-14 DIAGNOSIS — R5381 Other malaise: Secondary | ICD-10-CM

## 2013-07-14 LAB — CBC WITH DIFFERENTIAL/PLATELET
Basophils Relative: 0 % (ref 0–1)
Eosinophils Absolute: 0.1 10*3/uL (ref 0.0–0.7)
Eosinophils Relative: 2 % (ref 0–5)
HCT: 40.9 % (ref 36.0–46.0)
Hemoglobin: 14 g/dL (ref 12.0–15.0)
MCH: 28.9 pg (ref 26.0–34.0)
MCHC: 34.2 g/dL (ref 30.0–36.0)
Monocytes Absolute: 0.4 10*3/uL (ref 0.1–1.0)
Monocytes Relative: 8 % (ref 3–12)
RDW: 14 % (ref 11.5–15.5)

## 2013-07-14 MED ORDER — VALACYCLOVIR HCL 1 G PO TABS: ORAL_TABLET | ORAL | Status: DC

## 2013-07-14 NOTE — Assessment & Plan Note (Addendum)
Etiology remains unclear.  Recommended that she complete a cxr, CT abdomen/pelvis due to abdominal pain. Will also check a quantaferon gold tb test. 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 Check HIV, CBC.  Will also check autoimmune studies. She has an upcoming apt with rheumatology and I have advised her to keep this appointment. In terms of her malaise and fatigue, I suspect that this is multifactorial due to depression, fibromyalgia, chronic pain,

## 2013-07-14 NOTE — Progress Notes (Signed)
Subjective:    Patient ID: [name], female    DOB: [birth year], 57 y.o.   MRN: [id number]  HPI  Ms. Groome is a 57 yr old female who presents today for follow up.  She reports ongoing nausea, vomiting, fatigue/malaise, and intermittent nightly fevers. Symptoms have been present for approximately 1 month.  Chart review notes that she did have some delayed gastric emptying on gastric emptying study which was ordered by GI.   Reports that recently it takes "everything I can do just to get out of the bed." She reports that tmax this week was 101.4.  Last night 100.4. 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    Headaches- worse in the afternoon or after she has been outside.   She saw Dr. Michel Harrow urology (follows her for interstitial cystitis) who told pt to see Dr. Kellie Simmering in September to have rheumatology evaluation due to her complaints. She is scheduled to see him next month.     Review of Systems     Past Medical History  Diagnosis Date  . Diverticulosis   . Restless leg syndrome   . IBS (irritable bowel syndrome)   . IC (interstitial cystitis)   . Hypertension   . Diabetes mellitus     diet controlled  . Depression   . Fibromyalgia   . Anxiety   . Osteopenia   . Arthritis     in knees  . Dyslipidemia   . Chronic fatigue syndrome   . Chronic abdominal pain   . Esophageal reflux   . TIA (transient ischemic attack) 08/22/2012    History   Social History  . Marital Status: Divorced    Spouse Name: N/A    Number of Children: 2  . Years of Education: N/A   Occupational History  . disabled    Social History Main Topics  . Smoking status: Former Smoker -- 0.10 packs/day for 1 years    Types: Cigarettes    Quit date: 10/19/2012  . Smokeless tobacco: Never Used     Comment: using electronic cigarettes  . Alcohol Use: Yes     Comment: 2 drinks per month  . Drug Use: No  . Sexual Activity: Not on file    Other Topics Concern  . Not on file   Social History Narrative   Disabled   Divorced, lives alone - 1 dtr, 1 son    Past Surgical History  Procedure Laterality Date  . Total knee arthroplasty      bilateral  . Tonsillectomy and adenoidectomy    . Breast cyst excision  1978    left  . Laparotomy  1982    cyst removed from right ovary and removal of adhesions  . Abdominal exploration surgery  1983    diverticulum removed  . Appendectomy  1983    with exploration  . Laparoscopic incisional / umbilical / ventral hernia repair  1991, 2001, 2007    x 3  . Vaginal hysterectomy  1992  . Laparotomy  1991  . Scalp surgery  1993, 1998    Removal of tumor  . Nasal sinus surgery  2004  . Rectocele repair  2009  . Cervical fusion  2010  . Bladder hydrodistention      Family History  Problem Relation Age of Onset  . Breast cancer Mother   . Breast cancer Maternal Grandmother   . Throat cancer Maternal Grandmother   .  Esophageal cancer Maternal Grandmother   . Breast cancer Other     maternal great aunts x 5  . Colon cancer Neg Hx   . Diabetes Father   . Diabetes Maternal Grandfather     Allergies  Allergen Reactions  . Chlorzoxazone     unknown  . Gabapentin     nausea  . Gemfibrozil     nausea  . Metronidazole     nausea  . Morphine And Related     vomiting  . Nitrofurantoin     nausea  . Olmesartan Medoxomil-Hctz     nausea  . Ondansetron Nausea And Vomiting  . Pentosan Polysulfate Sodium     unknown  . Propoxyphene-Acetaminophen     nausea  . Ramipril     REACTION: cough  . Sulfonamide Derivatives     nausea    Current Outpatient Prescriptions on File Prior to Visit  Medication Sig Dispense Refill  . ALPRAZolam (XANAX) 1 MG tablet Take 1 mg by mouth 3 (three) times daily as needed. For anxiety      . amLODipine (NORVASC) 5 MG tablet TAKE 1 TABLET (5 MG TOTAL) BY MOUTH AT BEDTIME.  30 tablet  2  . aspirin EC 325 MG tablet Take 325 mg by mouth daily.       Marland Kitchen desvenlafaxine (PRISTIQ) 50 MG 24 hr tablet Take 150 mg by mouth daily.      Marland Kitchen dicyclomine (BENTYL) 20 MG tablet Take 1 tablet (20 mg total) by mouth 2 (two) times daily as needed. Irration bowels  30 tablet  2  . fentaNYL (DURAGESIC) 100 MCG/HR Place 1 patch onto the skin every 2 days      . fluconazole (DIFLUCAN) 150 MG tablet One tab by mouth today, may repeat in 3 days as needed.  2 tablet  0  . fluticasone (FLONASE) 50 MCG/ACT nasal spray Place 2 sprays into the nose daily.  16 g  3  . furosemide (LASIX) 20 MG tablet TAKE ONE TABLET BY MOUTH DAILY  30 tablet  5  . gabapentin (NEURONTIN) 100 MG capsule TAKE 1 CAPSULE (100 MG TOTAL) BY MOUTH 3 (THREE) TIMES DAILY.  90 capsule  3  . lidocaine (LIDODERM) 5 % Place 1 patch onto the skin at bedtime.      Marland Kitchen losartan (COZAAR) 50 MG tablet TAKE 1 TABLET (50 MG TOTAL) BY MOUTH AT BEDTIME.  30 tablet  2  . metFORMIN (GLUCOPHAGE-XR) 500 MG 24 hr tablet TAKE 4 TABLETS (2,000 MG TOTAL) BY MOUTH DAILY WITH BREAKFAST.  120 tablet  2  . mirabegron ER (MYRBETRIQ) 50 MG TB24 Take 50 mg by mouth every evening.      Marland Kitchen NEXIUM 40 MG capsule TAKE 1 CAPSULE (40 MG TOTAL) BY MOUTH DAILY BEFORE BREAKFAST.  30 capsule  2  . oxyCODONE (OXY IR/ROXICODONE) 5 MG immediate release tablet Take 5 mg by mouth 3 (three) times daily.      Marland Kitchen POTASSIUM GLUCONATE PO Take 1 tablet by mouth as needed.      . promethazine (PHENERGAN) 25 MG tablet Take 25 mg by mouth at bedtime. Patient takes every day per patient      . rOPINIRole (REQUIP) 4 MG tablet TAKE 1 TABLET (4 MG TOTAL) BY MOUTH AT BEDTIME.  30 tablet  3  . tiZANidine (ZANAFLEX) 4 MG tablet Take 4 mg by mouth at bedtime.      . traZODone (DESYREL) 150 MG tablet Take 150 mg by mouth at bedtime.  No current facility-administered medications on file prior to visit.    BP 130/88  Pulse 92  Temp(Src) 97.4 F (36.3 C) (Oral)  Resp 16  Ht 5' 5.25" (1.657 m)  Wt 253 lb (114.76 kg)  BMI 41.8 kg/m2  SpO2  97%    Objective:   Physical Exam  Constitutional: She is oriented to person, place, and time. She appears well-developed and well-nourished. No distress.  HENT:  Head: Normocephalic and atraumatic.  Cardiovascular: Normal rate and regular rhythm.   No murmur heard. Pulmonary/Chest: Effort normal and breath sounds normal. No respiratory distress. She has no wheezes. She has no rales. She exhibits no tenderness.  Abdominal: Soft.  Hypoactive BS. Reports mild discomfort to palpation on the right side of the abdomen and the LLQ.  No rebound or guarding.   Neurological: She is alert and oriented to person, place, and time.  Psychiatric: She has a normal mood and affect. Her behavior is normal. Judgment and thought content normal.          Assessment & Plan:

## 2013-07-14 NOTE — Patient Instructions (Addendum)
Please complete your lab work prior to leaving. Follow up on Thursday for a PPD reading. Complete CXR on the first floor today.  We will contact you about your CT abdomen appointment. Follow up in 2 weeks.

## 2013-07-14 NOTE — Assessment & Plan Note (Addendum)
She is noted to have mild delayed gastric emptying.  This is likely cause for her nausea/vomitting. Defer management to GI- she follow up with Dr. Arlyce Dice on 9/19.

## 2013-07-15 LAB — BASIC METABOLIC PANEL
BUN: 18 mg/dL (ref 6–23)
CO2: 32 mEq/L (ref 19–32)
Calcium: 9.6 mg/dL (ref 8.4–10.5)
Creat: 0.78 mg/dL (ref 0.50–1.10)
Glucose, Bld: 164 mg/dL — ABNORMAL HIGH (ref 70–99)

## 2013-07-15 LAB — ANTI-NUCLEAR AB-TITER (ANA TITER)

## 2013-07-16 ENCOUNTER — Other Ambulatory Visit: Payer: Self-pay | Admitting: Gastroenterology

## 2013-07-16 ENCOUNTER — Telehealth: Payer: Self-pay | Admitting: *Deleted

## 2013-07-16 ENCOUNTER — Ambulatory Visit (HOSPITAL_BASED_OUTPATIENT_CLINIC_OR_DEPARTMENT_OTHER)
Admission: RE | Admit: 2013-07-16 | Discharge: 2013-07-16 | Disposition: A | Discharge status: Home / Self Care | Payer: Medicare Other | Source: Ambulatory Visit | Attending: Family | Admitting: Family

## 2013-07-16 DIAGNOSIS — N200 Calculus of kidney: Secondary | ICD-10-CM | POA: Insufficient documentation

## 2013-07-16 DIAGNOSIS — R109 Unspecified abdominal pain: Secondary | ICD-10-CM

## 2013-07-16 DIAGNOSIS — K8689 Other specified diseases of pancreas: Secondary | ICD-10-CM | POA: Insufficient documentation

## 2013-07-16 DIAGNOSIS — R509 Fever, unspecified: Secondary | ICD-10-CM

## 2013-07-16 LAB — QUANTIFERON TB GOLD ASSAY (BLOOD): Mitogen value: 9.13 IU/mL

## 2013-07-16 MED ORDER — IOHEXOL 300 MG/ML  SOLN
100.0000 mL | Freq: Once | INTRAMUSCULAR | Status: AC | PRN
  Administered 2013-07-16: 100 mL via INTRAVENOUS

## 2013-07-16 MED ORDER — METOCLOPRAMIDE HCL 10 MG PO TABS: 10.0000 mg | ORAL_TABLET | Freq: Four times a day (QID) | ORAL | Status: DC

## 2013-07-16 NOTE — Telephone Encounter (Signed)
Pt called requesting results of CT and blood work.  Please advise.

## 2013-07-16 NOTE — Telephone Encounter (Signed)
See my chart message

## 2013-07-17 ENCOUNTER — Other Ambulatory Visit: Payer: Self-pay | Admitting: *Deleted

## 2013-07-17 MED ORDER — GLUCOSE BLOOD VI STRP: ORAL_STRIP | Status: DC

## 2013-07-17 NOTE — Telephone Encounter (Signed)
Rx request to pharmacy/SLS  

## 2013-07-22 ENCOUNTER — Telehealth: Payer: Self-pay | Admitting: Family

## 2013-07-22 DIAGNOSIS — R509 Fever, unspecified: Secondary | ICD-10-CM

## 2013-07-22 NOTE — Telephone Encounter (Signed)
Call from Dr  Shawnee Knapp office    He states with fever and ANA results  More than likely not lupus, he will still see pt but  Would like [place] to consider  Infectious  Disease

## 2013-07-22 NOTE — Telephone Encounter (Signed)
Please call pt and let her know that I would like for her to also meet with Dr. Orvan Falconer- ID pended below.  I would like her to complete testing for lyme disease and [place] spotted fever in our office this week please.

## 2013-07-23 ENCOUNTER — Emergency Department (HOSPITAL_BASED_OUTPATIENT_CLINIC_OR_DEPARTMENT_OTHER): Payer: Medicare Other

## 2013-07-23 ENCOUNTER — Emergency Department (HOSPITAL_BASED_OUTPATIENT_CLINIC_OR_DEPARTMENT_OTHER)
Admission: EM | Admit: 2013-07-23 | Discharge: 2013-07-23 | Disposition: A | Discharge status: Home / Self Care | Payer: Medicare Other | Attending: Emergency Medicine | Admitting: Emergency Medicine

## 2013-07-23 ENCOUNTER — Encounter (HOSPITAL_BASED_OUTPATIENT_CLINIC_OR_DEPARTMENT_OTHER): Payer: Self-pay | Admitting: *Deleted

## 2013-07-23 DIAGNOSIS — Y9301 Activity, walking, marching and hiking: Secondary | ICD-10-CM | POA: Insufficient documentation

## 2013-07-23 DIAGNOSIS — F411 Generalized anxiety disorder: Secondary | ICD-10-CM | POA: Insufficient documentation

## 2013-07-23 DIAGNOSIS — I10 Essential (primary) hypertension: Secondary | ICD-10-CM | POA: Insufficient documentation

## 2013-07-23 DIAGNOSIS — Z79899 Other long term (current) drug therapy: Secondary | ICD-10-CM | POA: Insufficient documentation

## 2013-07-23 DIAGNOSIS — E119 Type 2 diabetes mellitus without complications: Secondary | ICD-10-CM | POA: Insufficient documentation

## 2013-07-23 DIAGNOSIS — Y92009 Unspecified place in unspecified non-institutional (private) residence as the place of occurrence of the external cause: Secondary | ICD-10-CM | POA: Insufficient documentation

## 2013-07-23 DIAGNOSIS — S4980XA Other specified injuries of shoulder and upper arm, unspecified arm, initial encounter: Secondary | ICD-10-CM | POA: Insufficient documentation

## 2013-07-23 DIAGNOSIS — F3289 Other specified depressive episodes: Secondary | ICD-10-CM | POA: Insufficient documentation

## 2013-07-23 DIAGNOSIS — W19XXXA Unspecified fall, initial encounter: Secondary | ICD-10-CM

## 2013-07-23 DIAGNOSIS — K219 Gastro-esophageal reflux disease without esophagitis: Secondary | ICD-10-CM | POA: Insufficient documentation

## 2013-07-23 DIAGNOSIS — G2581 Restless legs syndrome: Secondary | ICD-10-CM | POA: Insufficient documentation

## 2013-07-23 DIAGNOSIS — T148XXA Other injury of unspecified body region, initial encounter: Secondary | ICD-10-CM | POA: Insufficient documentation

## 2013-07-23 DIAGNOSIS — S46909A Unspecified injury of unspecified muscle, fascia and tendon at shoulder and upper arm level, unspecified arm, initial encounter: Secondary | ICD-10-CM | POA: Insufficient documentation

## 2013-07-23 DIAGNOSIS — Z87891 Personal history of nicotine dependence: Secondary | ICD-10-CM | POA: Insufficient documentation

## 2013-07-23 DIAGNOSIS — Z7982 Long term (current) use of aspirin: Secondary | ICD-10-CM | POA: Insufficient documentation

## 2013-07-23 DIAGNOSIS — Z87448 Personal history of other diseases of urinary system: Secondary | ICD-10-CM | POA: Insufficient documentation

## 2013-07-23 DIAGNOSIS — Z8639 Personal history of other endocrine, nutritional and metabolic disease: Secondary | ICD-10-CM | POA: Insufficient documentation

## 2013-07-23 DIAGNOSIS — W108XXA Fall (on) (from) other stairs and steps, initial encounter: Secondary | ICD-10-CM | POA: Insufficient documentation

## 2013-07-23 DIAGNOSIS — Z8673 Personal history of transient ischemic attack (TIA), and cerebral infarction without residual deficits: Secondary | ICD-10-CM | POA: Insufficient documentation

## 2013-07-23 DIAGNOSIS — Z862 Personal history of diseases of the blood and blood-forming organs and certain disorders involving the immune mechanism: Secondary | ICD-10-CM | POA: Insufficient documentation

## 2013-07-23 DIAGNOSIS — IMO0001 Reserved for inherently not codable concepts without codable children: Secondary | ICD-10-CM | POA: Insufficient documentation

## 2013-07-23 DIAGNOSIS — M1710 Unilateral primary osteoarthritis, unspecified knee: Secondary | ICD-10-CM | POA: Insufficient documentation

## 2013-07-23 DIAGNOSIS — F329 Major depressive disorder, single episode, unspecified: Secondary | ICD-10-CM | POA: Insufficient documentation

## 2013-07-23 LAB — URINALYSIS, ROUTINE W REFLEX MICROSCOPIC
Bilirubin Urine: NEGATIVE
Ketones, ur: NEGATIVE mg/dL
Leukocytes, UA: NEGATIVE
Nitrite: NEGATIVE
Protein, ur: NEGATIVE mg/dL

## 2013-07-23 MED ORDER — HYDROCODONE-ACETAMINOPHEN 5-325 MG PO TABS
1.0000 | ORAL_TABLET | Freq: Once | ORAL | Status: AC
  Administered 2013-07-23: 1 via ORAL
  Filled 2013-07-23: qty 1

## 2013-07-23 NOTE — Telephone Encounter (Signed)
Yes, please cancel apt with Dr. Dierdre Forth.

## 2013-07-23 NOTE — Telephone Encounter (Signed)
Notified pt and she voices understanding. She will return to the lab this week, orders signed. Pt already has rheumatology appt with Dr Kellie Simmering on 08/04/13. Pt is agreeable to proceed with ID referral. She will be out of town Saturday through next Wednesday.

## 2013-07-23 NOTE — Telephone Encounter (Signed)
Do we need to cancel previous referral to rheumatology since pt is already scheduled with Dr Kellie Simmering?

## 2013-07-23 NOTE — Telephone Encounter (Signed)
Referral cancelled and detailed message left on referral coordinator's voicemail at Dr Shawnee Knapp office to cancel previous appointment request and to call if any questions.

## 2013-07-23 NOTE — ED Notes (Signed)
Pt calls this rn into room, states "I am leaving!" when asked why she wants to leave, pt states "I hear people outside my door laughing and carrying on and I'm just in this room having to wait!" explained to pt that the voices in the hall are family members of another patient, and I will ask them to keep their voices down if they are disturbing her. Pt states "I just don't know why I'm having to wait while this place is empty! What are you people doing out there?" explained to pt that the department is full, and that the physician will be in to see her as soon as possible. Pt states "I guess I can stay a little while longer, but you need to hurry this up!"

## 2013-07-23 NOTE — ED Notes (Signed)
Patient states she was walking down her 26 steps out of her house, slipped on the wet step and fell down about one half of the steps.  C/O generalized body pain, especially left shoulder and sacrum. No LOC.

## 2013-07-23 NOTE — ED Provider Notes (Signed)
CSN: [id number]     Arrival date & time 07/23/13  1345 History   First MD Initiated Contact with Patient 07/23/13 1410     Chief Complaint  Patient presents with  . Fall   (Consider location/radiation/quality/duration/timing/severity/associated sxs/prior Treatment) HPI Comments: Pt is a 57 y/o who comes in with cc of fall. Pt had a fall while waling down stairs, and reportedly, fell down about 13 stairs. The fall was due to a slip from rain and occurred at just prior to arrival. Pt is complaining of back pain and shoulder pain. Pt has pain over sacral back, non radiating, and is sharp in nature. She has left shoulder pain, worse with movement.  She has no nausea, vomiting, visual complains, seizures, altered mental status, loss of consciousness, new weakness, or numbness, no gait instability. Pt is not on any anticoagulants. Pt has ambulated, in fact, feels better when standing up.    Patient is a 57 y.o. female presenting with fall. The history is provided by the patient.  Fall Pertinent negatives include no chest pain, no abdominal pain, no headaches and no shortness of breath.    Past Medical History  Diagnosis Date  . Diverticulosis   . Restless leg syndrome   . IBS (irritable bowel syndrome)   . IC (interstitial cystitis)   . Hypertension   . Diabetes mellitus     diet controlled  . Depression   . Fibromyalgia   . Anxiety   . Osteopenia   . Arthritis     in knees  . Dyslipidemia   . Chronic fatigue syndrome   . Chronic abdominal pain   . Esophageal reflux   . TIA (transient ischemic attack) 08/22/2012   Past Surgical History  Procedure Laterality Date  . Total knee arthroplasty      bilateral  . Tonsillectomy and adenoidectomy    . Breast cyst excision  1978    left  . Laparotomy  1982    cyst removed from right ovary and removal of adhesions  . Abdominal exploration surgery  1983    diverticulum removed  . Appendectomy  1983    with exploration  . Laparoscopic  incisional / umbilical / ventral hernia repair  1991, 2001, 2007    x 3  . Vaginal hysterectomy  1992  . Laparotomy  1991  . Scalp surgery  1993, 1998    Removal of tumor  . Nasal sinus surgery  2004  . Rectocele repair  2009  . Cervical fusion  2010  . Bladder hydrodistention     Family History  Problem Relation Age of Onset  . Breast cancer Mother   . Breast cancer Maternal Grandmother   . Throat cancer Maternal Grandmother   . Esophageal cancer Maternal Grandmother   . Breast cancer Other     maternal great aunts x 5  . Colon cancer Neg Hx   . Diabetes Father   . Diabetes Maternal Grandfather    History  Substance Use Topics  . Smoking status: Former Smoker -- 0.10 packs/day for 1 years    Types: Cigarettes    Quit date: 10/19/2012  . Smokeless tobacco: Never Used     Comment: using electronic cigarettes  . Alcohol Use: Yes     Comment: 2 drinks per month   OB History   Grav Para Term Preterm Abortions TAB SAB Ect Mult Living                 Review of Systems  Constitutional: Positive for activity change.  HENT: Negative for neck pain.   Respiratory: Negative for shortness of breath.   Cardiovascular: Negative for chest pain.  Gastrointestinal: Negative for nausea, vomiting and abdominal pain.  Genitourinary: Negative for dysuria.  Musculoskeletal: Positive for back pain and arthralgias.  Neurological: Negative for headaches.    Allergies  Chlorzoxazone; Gabapentin; Gemfibrozil; Metronidazole; Morphine and related; Nitrofurantoin; Olmesartan medoxomil-hctz; Ondansetron; Pentosan polysulfate sodium; Propoxyphene-acetaminophen; Ramipril; and Sulfonamide derivatives  Home Medications   Current Outpatient Rx  Name  Route  Sig  Dispense  Refill  . ALPRAZolam (XANAX) 1 MG tablet   Oral   Take 1 mg by mouth 3 (three) times daily as needed. For anxiety         . amLODipine (NORVASC) 5 MG tablet      TAKE 1 TABLET (5 MG TOTAL) BY MOUTH AT BEDTIME.   30  tablet   2   . aspirin EC 325 MG tablet   Oral   Take 325 mg by mouth daily.         Marland Kitchen desvenlafaxine (PRISTIQ) 50 MG 24 hr tablet   Oral   Take 150 mg by mouth daily.         Marland Kitchen dicyclomine (BENTYL) 20 MG tablet   Oral   Take 1 tablet (20 mg total) by mouth 2 (two) times daily as needed. Irration bowels   30 tablet   2   . fentaNYL (DURAGESIC) 100 MCG/HR      Place 1 patch onto the skin every 2 days         . fluconazole (DIFLUCAN) 150 MG tablet      One tab by mouth today, may repeat in 3 days as needed.   2 tablet   0   . fluticasone (FLONASE) 50 MCG/ACT nasal spray   Nasal   Place 2 sprays into the nose daily.   16 g   3   . furosemide (LASIX) 20 MG tablet      TAKE ONE TABLET BY MOUTH DAILY   30 tablet   5   . gabapentin (NEURONTIN) 100 MG capsule      TAKE 1 CAPSULE (100 MG TOTAL) BY MOUTH 3 (THREE) TIMES DAILY.   90 capsule   3   . glucose blood (ONE TOUCH ULTRA TEST) test strip      Use as instructed. Dx: 250.00   100 each   6   . lidocaine (LIDODERM) 5 %   Transdermal   Place 1 patch onto the skin at bedtime.         Marland Kitchen losartan (COZAAR) 50 MG tablet      TAKE 1 TABLET (50 MG TOTAL) BY MOUTH AT BEDTIME.   30 tablet   2   . metFORMIN (GLUCOPHAGE-XR) 500 MG 24 hr tablet      TAKE 4 TABLETS (2,000 MG TOTAL) BY MOUTH DAILY WITH BREAKFAST.   120 tablet   2   . metoCLOPramide (REGLAN) 10 MG tablet   Oral   Take 1 tablet (10 mg total) by mouth 4 (four) times daily.   120 tablet   3   . mirabegron ER (MYRBETRIQ) 50 MG TB24   Oral   Take 50 mg by mouth every evening.         Marland Kitchen NEXIUM 40 MG capsule      TAKE 1 CAPSULE (40 MG TOTAL) BY MOUTH DAILY BEFORE BREAKFAST.   30 capsule   2   . oxyCODONE (  OXY IR/ROXICODONE) 5 MG immediate release tablet   Oral   Take 5 mg by mouth 3 (three) times daily.         Marland Kitchen POTASSIUM GLUCONATE PO   Oral   Take 1 tablet by mouth as needed.         . promethazine (PHENERGAN) 25 MG  tablet   Oral   Take 25 mg by mouth at bedtime. Patient takes every day per patient         . rOPINIRole (REQUIP) 4 MG tablet      TAKE 1 TABLET (4 MG TOTAL) BY MOUTH AT BEDTIME.   30 tablet   3   . tiZANidine (ZANAFLEX) 4 MG tablet   Oral   Take 4 mg by mouth at bedtime.         . traZODone (DESYREL) 150 MG tablet   Oral   Take 150 mg by mouth at bedtime.         . valACYclovir (VALTREX) 1000 MG tablet      One tablet by mouth now and repeat in 12 hours (for each break out on your lips).   20 tablet   0    BP 153/78  Pulse 84  Temp(Src) 98.7 F (37.1 C) (Oral)  Resp 16  Ht 5' 5.5" (1.664 m)  Wt 253 lb (114.76 kg)  BMI 41.45 kg/m2  SpO2 100% Physical Exam  Nursing note and vitals reviewed. Constitutional: She is oriented to person, place, and time. She appears well-developed and well-nourished.  HENT:  Head: Normocephalic and atraumatic.  No midline c-spine tenderness, pt able to turn head to 45 degrees bilaterally without any pain and able to flex neck to the chest and extend without any pain or neurologic symptoms.   Eyes: EOM are normal. Pupils are equal, round, and reactive to light.  Neck: Neck supple.  Cardiovascular: Normal rate, regular rhythm and normal heart sounds.   No murmur heard. Pulmonary/Chest: Effort normal. No respiratory distress.  Abdominal: Soft. She exhibits no distension. There is no tenderness. There is no rebound and no guarding.  Musculoskeletal:  Head to toe evaluation shows no hematoma, bleeding of the scalp, no facial abrasions, step offs, crepitus, no tenderness to palpation of the bilateral upper and lower extremities, no gross deformities, no chest tenderness, no pelvic pain.  Pt has tenderness over the lower lumbar region and sacral region. No step offs, no erythema. Able to discriminate between sharp and dull. Able to ambulate   Neurological: She is alert and oriented to person, place, and time.  Skin: Skin is warm and  dry.    ED Course  Procedures (including critical care time) Labs Review Labs Reviewed  URINALYSIS, ROUTINE W REFLEX MICROSCOPIC - Abnormal; Notable for the following:    Glucose, UA 250 (*)    All other components within normal limits   Imaging Review Dg Lumbar Spine Complete  07/23/2013   *RADIOLOGY REPORT*  Clinical Data: Status post fall.  Pain.  LUMBAR SPINE - COMPLETE 4+ VIEW  Comparison: CT abdomen and pelvis 07/16/2013.  Findings: Vertebral body height and alignment are maintained. Facet degenerative disease lower lumbar spine is noted.  Mesh from prior hernia repair is seen.  IMPRESSION: No acute finding.  Lower lumbar degenerative change.   Original Report Authenticated By: Holley Dexter, M.D.    MDM   1. Fall, initial encounter   2. Contusion    DDx includes: - Mechanical falls - ICH - Fractures - Contusions - Soft  tissue injury  Pt comes in with cc of fall. Head and cspine cleared clinically.  Sacral pain - xray ordered. Neurovascularly intact. Ambulating.  Shoulder pain - but ROM is intact. Xray ordered.  If imaging is normal, no concerns from trauma perspective.   Derwood Kaplan, MD 07/23/13 1550

## 2013-07-24 ENCOUNTER — Telehealth: Payer: Self-pay | Admitting: Family

## 2013-07-24 LAB — ROCKY MTN SPOTTED FVR ABS PNL(IGG+IGM): RMSF IgG: 0.2 IV

## 2013-07-24 LAB — B. BURGDORFI ANTIBODIES: B burgdorferi Ab IgG+IgM: 0.25 {ISR}

## 2013-07-24 NOTE — Telephone Encounter (Signed)
Patient scheduled appointment for 07/31/13

## 2013-07-24 NOTE — Telephone Encounter (Signed)
pls call pt and arrange an ED follow up next week.

## 2013-07-25 ENCOUNTER — Encounter: Payer: Self-pay | Admitting: Family

## 2013-07-31 ENCOUNTER — Encounter: Payer: Self-pay | Admitting: Family

## 2013-07-31 ENCOUNTER — Ambulatory Visit (INDEPENDENT_AMBULATORY_CARE_PROVIDER_SITE_OTHER): Payer: Medicare Other | Admitting: Family

## 2013-07-31 ENCOUNTER — Ambulatory Visit: Payer: Medicare Other | Admitting: Family

## 2013-07-31 VITALS — BP 160/86 | HR 89 | Temp 98.3°F | Resp 16 | Ht 65.25 in | Wt 249.1 lb

## 2013-07-31 DIAGNOSIS — Z23 Encounter for immunization: Secondary | ICD-10-CM

## 2013-07-31 DIAGNOSIS — W19XXXA Unspecified fall, initial encounter: Secondary | ICD-10-CM

## 2013-07-31 DIAGNOSIS — N6310 Unspecified lump in the right breast, unspecified quadrant: Secondary | ICD-10-CM | POA: Insufficient documentation

## 2013-07-31 DIAGNOSIS — N63 Unspecified lump in unspecified breast: Secondary | ICD-10-CM

## 2013-07-31 NOTE — Progress Notes (Signed)
Subjective:    Patient ID: [name], female    DOB: 11/25/1955, 57 y.o.   MRN: [id number]  HPI  [name] is a 57 yr old female who presents today with chief complaint of lump in the right breast. Reports thatshe has had this "lump" in the upper right breast     Fall- She was evaluated in the ED on 9/4 following a fall. This occurred while walking down stairs.  She fell 13 steps.  Fall was due to a slip on wet steps.  X ray of shoulder and l-spine were negative.   Reports that her feet went out from under her and she went down on "my butt."  Reports that she is improving but it still hurts to sit.    Review of Systems See HPI  Past Medical History  Diagnosis Date  . Diverticulosis   . Restless leg syndrome   . IBS (irritable bowel syndrome)   . IC (interstitial cystitis)   . Hypertension   . Diabetes mellitus     diet controlled  . Depression   . Fibromyalgia   . Anxiety   . Osteopenia   . Arthritis     in knees  . Dyslipidemia   . Chronic fatigue syndrome   . Chronic abdominal pain   . Esophageal reflux   . TIA (transient ischemic attack) 08/22/2012    History   Social History  . Marital Status: Divorced    Spouse Name: N/A    Number of Children: 2  . Years of Education: N/A   Occupational History  . disabled    Social History Main Topics  . Smoking status: Former Smoker -- 0.10 packs/day for 1 years    Types: Cigarettes    Quit date: 10/19/2012  . Smokeless tobacco: Never Used     Comment: using electronic cigarettes  . Alcohol Use: Yes     Comment: 2 drinks per month  . Drug Use: No  . Sexual Activity: Not on file   Other Topics Concern  . Not on file   Social History Narrative   Disabled   Divorced, lives alone - 1 dtr, 1 son    Past Surgical History  Procedure Laterality Date  . Total knee arthroplasty      bilateral  . Tonsillectomy and adenoidectomy    . Breast cyst excision  1978    left  . Laparotomy  1982    cyst removed  from right ovary and removal of adhesions  . Abdominal exploration surgery  1983    diverticulum removed  . Appendectomy  1983    with exploration  . Laparoscopic incisional / umbilical / ventral hernia repair  1991, 2001, 2007    x 3  . Vaginal hysterectomy  1992  . Laparotomy  1991  . Scalp surgery  1993, 1998    Removal of tumor  . Nasal sinus surgery  2004  . Rectocele repair  2009  . Cervical fusion  2010  . Bladder hydrodistention      Family History  Problem Relation Age of Onset  . Breast cancer Mother   . Breast cancer Maternal Grandmother   . Throat cancer Maternal Grandmother   . Esophageal cancer Maternal Grandmother   . Breast cancer Other     maternal great aunts x 5  . Colon cancer Neg Hx   . Diabetes Father   . Diabetes Maternal Grandfather     Allergies  Allergen Reactions  . Chlorzoxazone  unknown  . Gabapentin     nausea  . Gemfibrozil     nausea  . Metronidazole     nausea  . Morphine And Related     vomiting  . Nitrofurantoin     nausea  . Olmesartan Medoxomil-Hctz     nausea  . Ondansetron Nausea And Vomiting  . Pentosan Polysulfate Sodium     unknown  . Propoxyphene-Acetaminophen     nausea  . Ramipril     REACTION: cough  . Sulfonamide Derivatives     nausea    Current Outpatient Prescriptions on File Prior to Visit  Medication Sig Dispense Refill  . ALPRAZolam (XANAX) 1 MG tablet Take 1 mg by mouth 3 (three) times daily as needed. For anxiety      . amLODipine (NORVASC) 5 MG tablet TAKE 1 TABLET (5 MG TOTAL) BY MOUTH AT BEDTIME.  30 tablet  2  . aspirin EC 325 MG tablet Take 325 mg by mouth daily.      Marland Kitchen desvenlafaxine (PRISTIQ) 50 MG 24 hr tablet Take 150 mg by mouth daily.      Marland Kitchen dicyclomine (BENTYL) 20 MG tablet Take 1 tablet (20 mg total) by mouth 2 (two) times daily as needed. Irration bowels  30 tablet  2  . fentaNYL (DURAGESIC) 100 MCG/HR Place 1 patch onto the skin every 2 days      . fluconazole (DIFLUCAN) 150 MG  tablet One tab by mouth today, may repeat in 3 days as needed.  2 tablet  0  . fluticasone (FLONASE) 50 MCG/ACT nasal spray Place 2 sprays into the nose daily.  16 g  3  . furosemide (LASIX) 20 MG tablet TAKE ONE TABLET BY MOUTH DAILY  30 tablet  5  . gabapentin (NEURONTIN) 100 MG capsule TAKE 1 CAPSULE (100 MG TOTAL) BY MOUTH 3 (THREE) TIMES DAILY.  90 capsule  3  . glucose blood (ONE TOUCH ULTRA TEST) test strip Use as instructed. Dx: 250.00  100 each  6  . lidocaine (LIDODERM) 5 % Place 1 patch onto the skin at bedtime.      Marland Kitchen losartan (COZAAR) 50 MG tablet TAKE 1 TABLET (50 MG TOTAL) BY MOUTH AT BEDTIME.  30 tablet  2  . metFORMIN (GLUCOPHAGE-XR) 500 MG 24 hr tablet TAKE 4 TABLETS (2,000 MG TOTAL) BY MOUTH DAILY WITH BREAKFAST.  120 tablet  2  . metoCLOPramide (REGLAN) 10 MG tablet Take 1 tablet (10 mg total) by mouth 4 (four) times daily.  120 tablet  3  . mirabegron ER (MYRBETRIQ) 50 MG TB24 Take 50 mg by mouth every evening.      Marland Kitchen NEXIUM 40 MG capsule TAKE 1 CAPSULE (40 MG TOTAL) BY MOUTH DAILY BEFORE BREAKFAST.  30 capsule  2  . oxyCODONE (OXY IR/ROXICODONE) 5 MG immediate release tablet Take 5 mg by mouth 3 (three) times daily.      Marland Kitchen POTASSIUM GLUCONATE PO Take 1 tablet by mouth as needed.      . promethazine (PHENERGAN) 25 MG tablet Take 25 mg by mouth at bedtime. Patient takes every day per patient      . rOPINIRole (REQUIP) 4 MG tablet TAKE 1 TABLET (4 MG TOTAL) BY MOUTH AT BEDTIME.  30 tablet  3  . tiZANidine (ZANAFLEX) 4 MG tablet Take 4 mg by mouth at bedtime.      . traZODone (DESYREL) 150 MG tablet Take 150 mg by mouth at bedtime.      . valACYclovir (  VALTREX) 1000 MG tablet One tablet by mouth now and repeat in 12 hours (for each break out on your lips).  20 tablet  0   No current facility-administered medications on file prior to visit.    BP 160/86  Pulse 89  Temp(Src) 98.3 F (36.8 C) (Oral)  Resp 16  Ht 5' 5.25" (1.657 m)  Wt 249 lb 1.3 oz (112.982 kg)  BMI 41.15  kg/m2  SpO2 98%       Objective:   Physical Exam  Constitutional: She is oriented to person, place, and time. She appears well-developed and well-nourished. No distress.  HENT:  Head: Normocephalic and atraumatic.  Cardiovascular: Normal rate and regular rhythm.   No murmur heard. Pulmonary/Chest: Effort normal and breath sounds normal. No respiratory distress. She has no wheezes. She has no rales. She exhibits no tenderness.  Genitourinary:  Rubbery mobile mass right breast at about 12 oclock.  Musculoskeletal: She exhibits no edema.  Neurological: She is alert and oriented to person, place, and time.  Skin: Skin is warm and dry.  Psychiatric: She has a normal mood and affect. Her behavior is normal. Judgment and thought content normal.          Assessment & Plan:

## 2013-07-31 NOTE — Assessment & Plan Note (Signed)
Previous imaging has noted lipoma, but she notes that the area has become larger. Will send for diagnostic mammo and ultrasound.

## 2013-07-31 NOTE — Patient Instructions (Addendum)
You will be contacted about your referral for breast imaging. Please follow up in 3 months.

## 2013-07-31 NOTE — Assessment & Plan Note (Signed)
ED work up was negative.  She continues to improve.

## 2013-08-06 ENCOUNTER — Ambulatory Visit (INDEPENDENT_AMBULATORY_CARE_PROVIDER_SITE_OTHER): Payer: Medicare Other | Admitting: Infectious Disease

## 2013-08-06 ENCOUNTER — Encounter: Payer: Self-pay | Admitting: Infectious Disease

## 2013-08-06 ENCOUNTER — Other Ambulatory Visit: Payer: Self-pay | Admitting: Family

## 2013-08-06 VITALS — BP 126/89 | HR 86 | Temp 98.2°F | Ht 65.5 in | Wt 248.0 lb

## 2013-08-06 DIAGNOSIS — R112 Nausea with vomiting, unspecified: Secondary | ICD-10-CM

## 2013-08-06 DIAGNOSIS — R509 Fever, unspecified: Secondary | ICD-10-CM

## 2013-08-06 DIAGNOSIS — R5381 Other malaise: Secondary | ICD-10-CM

## 2013-08-06 DIAGNOSIS — IMO0001 Reserved for inherently not codable concepts without codable children: Secondary | ICD-10-CM

## 2013-08-06 LAB — CK: Total CK: 42 U/L (ref 7–177)

## 2013-08-06 LAB — LACTATE DEHYDROGENASE: LDH: 155 U/L (ref 94–250)

## 2013-08-06 NOTE — Progress Notes (Signed)
Subjective:    Patient ID: [name], female    DOB: October 12, 1956, 57 y.o.   MRN: [id number]  HPI  57 year old Caucasian female with history of fibromyalgia obstructive sleep apnea prior TIA 2 has been having subjective fevers and measured fevers up to 101 at home for several weeks if not months she has been seen by parakeratosis ordered several labs and tests workup this fever unknown origin. So far CT the abdomen and pelvis was completely unremarkable for cause of fever. Sedimentation rate was normal ANA was positive but titer was negative rheumatoid factor was negative HIV antibody was negative QUANTIFERON was negative.  Her predominant other symptoms besides fevers are nausea with emesis gastroesophageal reflux, , pain headaches that sound migrainous in nature.  Review of Systems  Constitutional: Positive for fever, fatigue and unexpected weight change. Negative for chills, diaphoresis, activity change and appetite change.  HENT: Negative for congestion, sore throat, rhinorrhea, sneezing, trouble swallowing and sinus pressure.   Eyes: Negative for photophobia and visual disturbance.  Respiratory: Negative for cough, chest tightness, shortness of breath, wheezing and stridor.   Cardiovascular: Negative for chest pain, palpitations and leg swelling.  Gastrointestinal: Positive for nausea and vomiting. Negative for abdominal pain, diarrhea, constipation, blood in stool, abdominal distention and anal bleeding.  Genitourinary: Negative for dysuria, hematuria, flank pain and difficulty urinating.  Musculoskeletal: Positive for myalgias, joint swelling and arthralgias. Negative for back pain and gait problem.  Skin: Negative for color change, pallor, rash and wound.  Neurological: Positive for dizziness and headaches. Negative for tremors, weakness and light-headedness.  Hematological: Negative for adenopathy. Does not bruise/bleed easily.  Psychiatric/Behavioral: Negative for behavioral  problems, confusion, sleep disturbance, dysphoric mood, decreased concentration and agitation.       Objective:   Physical Exam  Constitutional: She is oriented to person, place, and time. She appears well-developed and well-nourished. No distress.  HENT:  Head: Normocephalic and atraumatic.  Mouth/Throat: Oropharynx is clear and moist. No oropharyngeal exudate.  Eyes: Conjunctivae and EOM are normal. Pupils are equal, round, and reactive to light. No scleral icterus.  Neck: Normal range of motion. Neck supple. No JVD present.  Cardiovascular: Normal rate, regular rhythm and normal heart sounds.  Exam reveals no gallop and no friction rub.   No murmur heard. Pulmonary/Chest: Effort normal and breath sounds normal. No respiratory distress. She has no wheezes. She has no rales. She exhibits no tenderness.  Abdominal: She exhibits no distension and no mass. There is no tenderness. There is no rebound and no guarding.  Musculoskeletal: She exhibits no edema and no tenderness.  Lymphadenopathy:    She has no cervical adenopathy.  Neurological: She is alert and oriented to person, place, and time. She exhibits normal muscle tone. Coordination normal.  Skin: Skin is warm and dry. She is not diaphoretic. No erythema. No pallor.  Psychiatric: She has a normal mood and affect. Her behavior is normal. Judgment and thought content normal.          Assessment & Plan:   FUO: given normal ESR and other unremarkable tests so far my suspicion is that we WILL not find a cause for this FUO but that it will likely resolve. THat being said we will order additional tests including SPEP, CPK, LDH, angiotensin-converting enzyme level, cytomegalovirus panel Epstein-Barr virus panel acute hepatitis titers, blood cultures x2 CT of the chest with contrast. If all of the above is negative I will also get an MRI of her brain. I spent greater  than 60 minutes with the patient including greater than 50% of time in face  to face counsel of the patient and in coordination of their care.    Myalgias joint discomforts: Her serologies here an impressive but do think it is reasonable for her to be seen by rheumatologist for formal evaluation.

## 2013-08-07 ENCOUNTER — Encounter: Payer: Self-pay | Admitting: Gastroenterology

## 2013-08-07 ENCOUNTER — Ambulatory Visit (INDEPENDENT_AMBULATORY_CARE_PROVIDER_SITE_OTHER): Payer: Medicare Other | Admitting: Gastroenterology

## 2013-08-07 VITALS — BP 138/98 | HR 80 | Ht 65.0 in | Wt 246.0 lb

## 2013-08-07 DIAGNOSIS — R112 Nausea with vomiting, unspecified: Secondary | ICD-10-CM

## 2013-08-07 DIAGNOSIS — K3189 Other diseases of stomach and duodenum: Secondary | ICD-10-CM

## 2013-08-07 DIAGNOSIS — K30 Functional dyspepsia: Secondary | ICD-10-CM

## 2013-08-07 LAB — EPSTEIN-BARR VIRUS VCA ANTIBODY PANEL
EBV EA IgG: 52.1 U/mL — ABNORMAL HIGH (ref <9.0)
EBV VCA IgM: <10 U/mL (ref <36.0)

## 2013-08-07 LAB — HEPATITIS PANEL, ACUTE: HCV Ab: NEGATIVE

## 2013-08-07 LAB — ANGIOTENSIN CONVERTING ENZYME: Angiotensin-Converting Enzyme: 45 U/L (ref 8–52)

## 2013-08-07 NOTE — Patient Instructions (Addendum)

## 2013-08-07 NOTE — Progress Notes (Signed)
History of Present Illness:  The patient has returned still complaining of severe nausea.  She has projectile vomiting about every other day.  Preceding these vomiting episodes she may have intense upper epigastric pain that is relieved with vomiting.  Gastric empty scan was abnormal.  When she took Reglan she developed nervousness and jitteriness and had to discontinue this.  She did not notice a change in her GI symptoms with Reglan.  She continues on a narcotic patch which she has been taking for years.  She is on Nexium daily.    Review of Systems: Pertinent positive and negative review of systems were noted in the above HPI section. All other review of systems were otherwise negative.    Current Medications, Allergies, Past Medical History, Past Surgical History, Family History and Social History were reviewed in [facility] electronic medical record  Vital signs were reviewed in today's medical record. Physical Exam: General: Well developed , well nourished, no acute distress On abdominal exam there is moderate tenderness in the upper epigastrium to light palpation.  There is no succussion splash.  There is no organomegaly or masses the

## 2013-08-07 NOTE — Assessment & Plan Note (Signed)
The patient may have gastroparesis as evidenced by the gastric emptying scan although she is on chronic narcotics that can cause slowing of her stomach emptying.  She was intolerant to Reglan.  Partial gastric outlet obstruction should be ruled out.  She's been taking a narcotic pouch for years rendering narcotic-induced nausea less likely.  Recommendations #1 upper endoscopy

## 2013-08-07 NOTE — Assessment & Plan Note (Signed)
Decrease gastric emptying could be do to gastroparesis and/or narcotics.

## 2013-08-08 ENCOUNTER — Other Ambulatory Visit: Payer: Self-pay | Admitting: Family

## 2013-08-08 LAB — CMV IGM: CMV IgM: <8 AU/mL (ref <30.00)

## 2013-08-08 LAB — CYTOMEGALOVIRUS ANTIBODY, IGG: Cytomegalovirus Ab-IgG: <0.2 U/mL (ref <0.60)

## 2013-08-10 ENCOUNTER — Encounter: Payer: Self-pay | Admitting: Gastroenterology

## 2013-08-10 LAB — PROTEIN ELECTROPHORESIS, SERUM
Alpha-1-Globulin: 3.8 % (ref 2.9–4.9)
Alpha-2-Globulin: 8.7 % (ref 7.1–11.8)
Beta Globulin: 7.8 % — ABNORMAL HIGH (ref 4.7–7.2)
Total Protein, Serum Electrophoresis: 7.1 g/dL (ref 6.0–8.3)

## 2013-08-12 LAB — CULTURE, BLOOD (SINGLE): Organism ID, Bacteria: NO GROWTH

## 2013-08-13 ENCOUNTER — Other Ambulatory Visit: Payer: Self-pay | Admitting: Infectious Disease

## 2013-08-13 ENCOUNTER — Other Ambulatory Visit: Payer: Medicare Other

## 2013-08-13 DIAGNOSIS — R509 Fever, unspecified: Secondary | ICD-10-CM

## 2013-08-18 ENCOUNTER — Ambulatory Visit
Admission: RE | Admit: 2013-08-18 | Discharge: 2013-08-18 | Disposition: A | Discharge status: Home / Self Care | Payer: Medicare Other | Source: Ambulatory Visit | Attending: Family | Admitting: Family

## 2013-08-18 ENCOUNTER — Other Ambulatory Visit: Payer: Medicare Other

## 2013-08-18 DIAGNOSIS — R509 Fever, unspecified: Secondary | ICD-10-CM

## 2013-08-18 DIAGNOSIS — N6310 Unspecified lump in the right breast, unspecified quadrant: Secondary | ICD-10-CM

## 2013-08-20 ENCOUNTER — Encounter: Payer: Self-pay | Admitting: Infectious Disease

## 2013-08-21 ENCOUNTER — Telehealth: Payer: Self-pay | Admitting: *Deleted

## 2013-08-21 NOTE — Telephone Encounter (Signed)
Dr. Daiva Eves please see patient's email question and advise on what to tell patient. Thank you Wendall Mola

## 2013-08-21 NOTE — Telephone Encounter (Signed)
I sent her email.

## 2013-09-03 ENCOUNTER — Encounter: Payer: Self-pay | Admitting: Gastroenterology

## 2013-09-03 ENCOUNTER — Ambulatory Visit (AMBULATORY_SURGERY_CENTER): Payer: 59 | Admitting: Gastroenterology

## 2013-09-03 VITALS — BP 145/91 | HR 60 | Temp 97.4°F | Resp 13 | Ht 65.0 in | Wt 246.0 lb

## 2013-09-03 DIAGNOSIS — K3189 Other diseases of stomach and duodenum: Secondary | ICD-10-CM

## 2013-09-03 DIAGNOSIS — R112 Nausea with vomiting, unspecified: Secondary | ICD-10-CM

## 2013-09-03 LAB — GLUCOSE, CAPILLARY
Glucose-Capillary: 132 mg/dL — ABNORMAL HIGH (ref 70–99)
Glucose-Capillary: 128 mg/dL — ABNORMAL HIGH (ref 70–99)

## 2013-09-03 MED ORDER — SODIUM CHLORIDE 0.9 % IV SOLN: 500.0000 mL | INTRAVENOUS | Status: DC

## 2013-09-03 NOTE — Progress Notes (Signed)
Report to pacu rn, vss, bbs=clear 

## 2013-09-03 NOTE — Patient Instructions (Signed)

## 2013-09-03 NOTE — Op Note (Addendum)
[facility] [address] [place], [address]   ENDOSCOPY PROCEDURE REPORT  PATIENT: [name], [name]  MR#: [id number] BIRTHDATE: 02/11/1956 , 57  yrs. old GENDER: Female ENDOSCOPIST: Louis Meckel, MD REFERRED BY: PROCEDURE DATE:  09/03/2013 PROCEDURE:  EGD, diagnostic ASA CLASS:     Class II INDICATIONS:  Nausea.   Vomiting. MEDICATIONS: MAC sedation, administered by CRNA, Propofol (Diprivan), and Propofol (Diprivan) 170 mg IV TOPICAL ANESTHETIC:  DESCRIPTION OF PROCEDURE: After the risks benefits and alternatives of the procedure were thoroughly explained, informed consent was obtained.  The [id number] endoscope was introduced through the mouth and advanced to the third portion of the duodenum. Without limitations.  The instrument was slowly withdrawn as the mucosa was fully examined.      There was a single superficial erosion at the GE junction.   There was a single superficial erosion at the GE junction.   The remainder of the upper endoscopy exam was otherwise normal. Retroflexed views revealed no abnormalities.     The scope was then withdrawn from the patient and the procedure completed.  COMPLICATIONS: There were no complications. ENDOSCOPIC IMPRESSION: 1.   There was a single superficial erosion at the GE junction. 2.   The remainder of the upper endoscopy exam was otherwise normal  RECOMMENDATIONS: 1.  trial of domperidone 10mg  qid 2.  office visit 3 - 4  weeks  REPEAT EXAM:  eSigned:  Louis Meckel, MD 09/03/2013 1:48 PM Revised: 09/03/2013 1:48 PM  CC:

## 2013-09-03 NOTE — Progress Notes (Signed)
Patient did not experience any of the following events: a burn prior to discharge; a fall within the facility; wrong site/side/patient/procedure/implant event; or a hospital transfer or hospital admission upon discharge from the facility. (G8907) Patient did not have preoperative order for IV antibiotic SSI prophylaxis. (G8918)  

## 2013-09-04 ENCOUNTER — Telehealth: Payer: Self-pay | Admitting: *Deleted

## 2013-09-04 NOTE — Telephone Encounter (Signed)
No answer. No identifier. Message left to call if any questions or concerns. 

## 2013-09-09 ENCOUNTER — Other Ambulatory Visit: Payer: Self-pay | Admitting: Family

## 2013-09-10 NOTE — Telephone Encounter (Signed)
Rx request for Losartan [last Rx 06.30.14, #30x3] and Requip [last Rx 07.20.14, #30x2]; last OV 09.12.14 top F/U in [3] Mths/SLS Refill sent per [facility] refill protocol.

## 2013-09-12 ENCOUNTER — Other Ambulatory Visit: Payer: Self-pay | Admitting: Family

## 2013-09-18 ENCOUNTER — Ambulatory Visit: Payer: Medicare Other | Admitting: Infectious Disease

## 2013-09-20 ENCOUNTER — Other Ambulatory Visit: Payer: Self-pay | Admitting: Family

## 2013-10-14 ENCOUNTER — Ambulatory Visit: Payer: 59 | Admitting: Gastroenterology

## 2013-10-22 ENCOUNTER — Encounter: Payer: Self-pay | Admitting: Infectious Disease

## 2013-10-22 ENCOUNTER — Ambulatory Visit (INDEPENDENT_AMBULATORY_CARE_PROVIDER_SITE_OTHER): Payer: PRIVATE HEALTH INSURANCE | Admitting: Infectious Disease

## 2013-10-22 VITALS — BP 138/85 | HR 83 | Temp 98.8°F | Wt 250.0 lb

## 2013-10-22 DIAGNOSIS — K2960 Other gastritis without bleeding: Secondary | ICD-10-CM

## 2013-10-22 DIAGNOSIS — IMO0001 Reserved for inherently not codable concepts without codable children: Secondary | ICD-10-CM

## 2013-10-22 DIAGNOSIS — K228 Other specified diseases of esophagus: Secondary | ICD-10-CM

## 2013-10-22 DIAGNOSIS — L738 Other specified follicular disorders: Secondary | ICD-10-CM

## 2013-10-22 DIAGNOSIS — E559 Vitamin D deficiency, unspecified: Secondary | ICD-10-CM

## 2013-10-22 DIAGNOSIS — G4733 Obstructive sleep apnea (adult) (pediatric): Secondary | ICD-10-CM

## 2013-10-22 DIAGNOSIS — M797 Fibromyalgia: Secondary | ICD-10-CM

## 2013-10-22 DIAGNOSIS — N3010 Interstitial cystitis (chronic) without hematuria: Secondary | ICD-10-CM

## 2013-10-22 DIAGNOSIS — R1115 Cyclical vomiting syndrome unrelated to migraine: Secondary | ICD-10-CM

## 2013-10-22 DIAGNOSIS — R509 Fever, unspecified: Secondary | ICD-10-CM

## 2013-10-22 DIAGNOSIS — K2210 Ulcer of esophagus without bleeding: Secondary | ICD-10-CM

## 2013-10-22 DIAGNOSIS — R1112 Projectile vomiting: Secondary | ICD-10-CM

## 2013-10-22 DIAGNOSIS — L853 Xerosis cutis: Secondary | ICD-10-CM

## 2013-10-22 DIAGNOSIS — K259 Gastric ulcer, unspecified as acute or chronic, without hemorrhage or perforation: Secondary | ICD-10-CM

## 2013-10-22 LAB — CBC WITH DIFFERENTIAL/PLATELET
Eosinophils Relative: 1 % (ref 0–5)
HCT: 39.6 % (ref 36.0–46.0)
Hemoglobin: 13.6 g/dL (ref 12.0–15.0)
Lymphocytes Relative: 36 % (ref 12–46)
Lymphs Abs: 2.1 10*3/uL (ref 0.7–4.0)
MCHC: 34.3 g/dL (ref 30.0–36.0)
MCV: 83.2 fL (ref 78.0–100.0)
Monocytes Absolute: 0.4 10*3/uL (ref 0.1–1.0)
Monocytes Relative: 7 % (ref 3–12)
Neutro Abs: 3.2 10*3/uL (ref 1.7–7.7)
Neutrophils Relative %: 56 % (ref 43–77)
RBC: 4.76 MIL/uL (ref 3.87–5.11)
WBC: 5.9 10*3/uL (ref 4.0–10.5)

## 2013-10-22 LAB — COMPLETE METABOLIC PANEL WITH GFR
ALT: 31 U/L (ref 0–35)
AST: 23 U/L (ref 0–37)
BUN: 15 mg/dL (ref 6–23)
Calcium: 9.6 mg/dL (ref 8.4–10.5)
Chloride: 98 mEq/L (ref 96–112)
Creat: 0.71 mg/dL (ref 0.50–1.10)
Total Bilirubin: 0.4 mg/dL (ref 0.3–1.2)

## 2013-10-22 LAB — C-REACTIVE PROTEIN: CRP: <0.5 mg/dL (ref <0.60)

## 2013-10-22 NOTE — Progress Notes (Signed)
Subjective:    Patient ID: [name], female    DOB: [birth year], 57 y.o.   MRN: [id number]  HPI   57 year old Caucasian female with history of fibromyalgia obstructive sleep apnea prior TIA 2 has been having subjective fevers and measured fevers up to 101 at home for more than a year now. I saw her last Spring and we had done a fairly exhaustive radiologic and serologic workup for fever of unknown origin. The only test that we had not yet done was a CT of the chest with contrast which I was a bit reluctant to add on given the amount of radiation involved with the test and my feeling that she was likely to have a significant malignancy or dangerous infection or rheumatological condition driving her fevers.  With regard to fevers she is asked he seen Dr. Ronnie Derby  With rheumatology and it was apparently entertained a diagnosis of Sjogrens but now they feel that she does not have this.  With respect to her fevers she states that they continue on a nightly basis up to 101. Note we have never recorded a temperature here in the clinic she did have one temperature of 99 at one point in time but that was the highest that I could see going back over the past year and a half. She also states that she continues to have "projectile vomiting. She also has occasional headaches which he think may be migrainous in nature. She also suffers from multiple pains in multiple joints and muscles which he believes are do to her fibromyalgia.  She states that her weight fluctuates going up and down I told her that this is actually an encouraging sign with regards to her fever of unknown origin as I would expect her to be steadily losing weight if there were a more dangerous underlying condition.  I did go overall of her serologies which were negative for recent infections she did have evidence of past infection with Epstein-Barr virus. Otherwise her workup was completely negative I reviewed every lab and  radiology image that we ordered with her.  She states she also still is suffering from interstitial cystitis and she receives injections of Botox by [facility] for this with more reasonable control the last few years.  She is seeing Dr. Arlyce Dice who performed an EGD which showed:.a single superficial erosion at the GE junction. With remainder of the upper endoscopy exam being otherwise normal  She states that she is trying to exercise to help with her fibromyalgia although she is handicapped by needing to go visit her to parents who both have Alzheimer's type dementia. She states she is compliant with her CPAP machine to treat her obstructive sleep apnea.  Finally she comes with a new complaint of dry skin and dry lips which have come on over the last several months and seem to be worsening her last several weeks. and asked if she can be tested for vitamin D deficiency and I've agreed to test for this.  Review of Systems  Constitutional: Positive for fever, fatigue and unexpected weight change. Negative for chills, diaphoresis, activity change and appetite change.  HENT: Negative for congestion, rhinorrhea, sinus pressure, sneezing, sore throat and trouble swallowing.   Eyes: Negative for photophobia and visual disturbance.  Respiratory: Negative for cough, chest tightness, shortness of breath, wheezing and stridor.   Cardiovascular: Negative for chest pain, palpitations and leg swelling.  Gastrointestinal: Positive for nausea and vomiting. Negative for abdominal pain, diarrhea, constipation,  blood in stool, abdominal distention and anal bleeding.  Genitourinary: Negative for dysuria, hematuria, flank pain and difficulty urinating.  Musculoskeletal: Positive for arthralgias, joint swelling and myalgias. Negative for back pain and gait problem.  Skin: Negative for color change, pallor, rash and wound.  Neurological: Positive for dizziness and headaches. Negative for tremors, weakness and  light-headedness.  Hematological: Negative for adenopathy. Does not bruise/bleed easily.  Psychiatric/Behavioral: Negative for behavioral problems, confusion, sleep disturbance, dysphoric mood, decreased concentration and agitation.       Objective:   Physical Exam  Constitutional: She is oriented to person, place, and time. She appears well-developed and well-nourished. No distress.  HENT:  Head: Normocephalic and atraumatic.  Mouth/Throat: Oropharynx is clear and moist. No oropharyngeal exudate.  Eyes: Conjunctivae and EOM are normal. Pupils are equal, round, and reactive to light. No scleral icterus.  Neck: Normal range of motion. Neck supple. No JVD present.  Cardiovascular: Normal rate, regular rhythm and normal heart sounds.  Exam reveals no gallop and no friction rub.   No murmur heard. Pulmonary/Chest: Effort normal and breath sounds normal. No respiratory distress. She has no wheezes. She has no rales. She exhibits no tenderness.  Abdominal: She exhibits no distension and no mass. There is no tenderness. There is no rebound and no guarding.  Musculoskeletal: She exhibits no edema and no tenderness.  Lymphadenopathy:    She has no cervical adenopathy.  Neurological: She is alert and oriented to person, place, and time. She exhibits normal muscle tone. Coordination normal.  Skin: Skin is warm and dry. She is not diaphoretic. No erythema. No pallor.  Psychiatric: She has a normal mood and affect. Her behavior is normal. Judgment and thought content normal.          Assessment & Plan:   FUO: given normal ESR and other unremarkable tests so far my suspicion is that we WILL not find a cause for this FUO but that it will likely resolve.   After extensive discussion of the risks and benefits of CT scans including risks of radiation with such studies we have ultimately decided to proceed with a CT of the chest with contrast. Also recheck a sedimentation rate C-reactive protein  competent metabolic panel and CBC with differential. If all these tests are normal I don't think we will gain much by pursuing other tests although I am willing to entertain doing an MRI I doubt we will find anything with that test either.  I think most likely the patient would benefit from continued symptomatic management of her fevers which as I mentioned before we have never documented in our clinic but which she tells [place] she has when she measures her temperatures at home.   Myalgias joint discomforts: Her serologies here Un impressive and she has had a formal rheumatology evaluation with further negative workup.  Fibromyalgia: Encourage her to exercise and continue to comply with her CPAP machine.  Peptic ulcer disease status post EGD by Dr. Arlyce Dice she will follow Dr. Arlyce Dice.  History of projectile vomiting: This is still some bothers her at least every month. Again we'll defer to Dr. Arlyce Dice she believes part of this is due to her diabetic gastroparesis.  Dry skin: This is a new problem which she asked me to evaluate and which could be suggestive of possible vitamin D deficiency and therefore we'll check a vitamin D level.

## 2013-10-23 LAB — SEDIMENTATION RATE: Sed Rate: 1 mm/hr (ref 0–22)

## 2013-10-26 LAB — VITAMIN D PNL(25-HYDRXY+1,25-DIHY)-BLD
Vit D, 25-Hydroxy: 35 ng/mL (ref 30–89)
Vitamin D 1, 25 (OH)2 Total: 59 pg/mL (ref 18–72)
Vitamin D2 1, 25 (OH)2: <8 pg/mL
Vitamin D3 1, 25 (OH)2: 59 pg/mL

## 2013-10-30 ENCOUNTER — Ambulatory Visit (HOSPITAL_BASED_OUTPATIENT_CLINIC_OR_DEPARTMENT_OTHER)
Admission: RE | Admit: 2013-10-30 | Discharge: 2013-10-30 | Disposition: A | Discharge status: Home / Self Care | Payer: PRIVATE HEALTH INSURANCE | Source: Ambulatory Visit | Attending: Infectious Disease | Admitting: Infectious Disease

## 2013-10-30 DIAGNOSIS — R509 Fever, unspecified: Secondary | ICD-10-CM | POA: Insufficient documentation

## 2013-10-30 MED ORDER — IOHEXOL 300 MG/ML  SOLN
80.0000 mL | Freq: Once | INTRAMUSCULAR | Status: AC | PRN
  Administered 2013-10-30: 80 mL via INTRAVENOUS

## 2013-11-02 ENCOUNTER — Ambulatory Visit (INDEPENDENT_AMBULATORY_CARE_PROVIDER_SITE_OTHER): Payer: 59 | Admitting: Family

## 2013-11-02 ENCOUNTER — Encounter: Payer: Self-pay | Admitting: Family

## 2013-11-02 ENCOUNTER — Other Ambulatory Visit: Payer: Self-pay | Admitting: Family

## 2013-11-02 VITALS — BP 140/90 | HR 86 | Temp 98.1°F | Resp 16 | Ht 65.25 in | Wt 249.1 lb

## 2013-11-02 DIAGNOSIS — I10 Essential (primary) hypertension: Secondary | ICD-10-CM

## 2013-11-02 DIAGNOSIS — R509 Fever, unspecified: Secondary | ICD-10-CM

## 2013-11-02 DIAGNOSIS — E119 Type 2 diabetes mellitus without complications: Secondary | ICD-10-CM

## 2013-11-02 MED ORDER — AMOXICILLIN 500 MG PO CAPS: 500.0000 mg | ORAL_CAPSULE | Freq: Three times a day (TID) | ORAL | Status: DC

## 2013-11-02 MED ORDER — ESOMEPRAZOLE MAGNESIUM 40 MG PO CPDR: DELAYED_RELEASE_CAPSULE | ORAL | Status: DC

## 2013-11-02 NOTE — Patient Instructions (Signed)
Please complete your lab work prior to leaving. Start amoxicillin for sinus infection, call if symptoms worsen or if symptoms do not improve.

## 2013-11-02 NOTE — Progress Notes (Signed)
Subjective:    Patient ID: [name], female    DOB: 01-May-1956, 57 y.o.   MRN: [id number]  HPI  [name] is a 57  Yr old female who presents today for follow up.   DM2- Last A1C drawn in May was 6.8. She is maintained on metformin. Reports eye exam, "start of cataracts" this spring.   HTN- Currently on losartan, amlodipine, furosemide. She denies CP or SOB.    BP Readings from Last 3 Encounters:  11/02/13 140/90  10/22/13 138/85  09/03/13 145/91   Hyperlipidemia-  Not on statin.  LDL 85 back in May.  Fevers- has met with ID and had work up which has been negative thus.  Reports "nose sores" x 1 week. Reports green nasal drainage.    Review of Systems See HPI  Past Medical History  Diagnosis Date  . Diverticulosis   . Restless leg syndrome   . IBS (irritable bowel syndrome)   . IC (interstitial cystitis)   . Hypertension   . Diabetes mellitus     diet controlled  . Depression   . Fibromyalgia   . Anxiety   . Osteopenia   . Arthritis     in knees  . Dyslipidemia   . Chronic fatigue syndrome   . Chronic abdominal pain   . Esophageal reflux   . TIA (transient ischemic attack) 08/22/2012    History   Social History  . Marital Status: Divorced    Spouse Name: N/A    Number of Children: 2  . Years of Education: N/A   Occupational History  . disabled    Social History Main Topics  . Smoking status: Former Smoker -- 0.10 packs/day for 1 years    Types: Cigarettes    Quit date: 10/19/2012  . Smokeless tobacco: Never Used     Comment: using electronic cigarettes  . Alcohol Use: Yes     Comment: 2 drinks per month  . Drug Use: No  . Sexual Activity: Not on file   Other Topics Concern  . Not on file   Social History Narrative   Disabled   Divorced, lives alone - 1 dtr, 1 son    Past Surgical History  Procedure Laterality Date  . Total knee arthroplasty      bilateral  . Tonsillectomy and adenoidectomy    . Breast cyst excision  1978   left  . Laparotomy  1982    cyst removed from right ovary and removal of adhesions  . Abdominal exploration surgery  1983    diverticulum removed  . Appendectomy  1983    with exploration  . Laparoscopic incisional / umbilical / ventral hernia repair  1991, 2001, 2007    x 3  . Vaginal hysterectomy  1992  . Laparotomy  1991  . Scalp surgery  1993, 1998    Removal of tumor  . Nasal sinus surgery  2004  . Rectocele repair  2009  . Cervical fusion  2010  . Bladder hydrodistention      Family History  Problem Relation Age of Onset  . Breast cancer Mother   . Dementia Mother   . Breast cancer Maternal Grandmother   . Throat cancer Maternal Grandmother   . Esophageal cancer Maternal Grandmother   . Breast cancer Other     maternal great aunts x 5  . Rheum arthritis Other   . Colon cancer Neg Hx   . Diabetes Father   . Dementia Father   .  Diabetes Maternal Grandfather     Allergies  Allergen Reactions  . Chlorzoxazone     unknown  . Gabapentin     nausea  . Gemfibrozil     nausea  . Metronidazole     nausea  . Morphine And Related     vomiting  . Nitrofurantoin     nausea  . Olmesartan Medoxomil-Hctz     nausea  . Ondansetron Nausea And Vomiting  . Pentosan Polysulfate Sodium     unknown  . Propoxyphene-Acetaminophen     nausea  . Ramipril     REACTION: cough  . Sulfonamide Derivatives     nausea    Current Outpatient Prescriptions on File Prior to Visit  Medication Sig Dispense Refill  . ALPRAZolam (XANAX) 1 MG tablet Take 1 mg by mouth 3 (three) times daily as needed. For anxiety      . aspirin EC 325 MG tablet Take 325 mg by mouth daily.      Marland Kitchen desvenlafaxine (PRISTIQ) 50 MG 24 hr tablet Take 150 mg by mouth daily.      Marland Kitchen dicyclomine (BENTYL) 20 MG tablet Take 1 tablet (20 mg total) by mouth 2 (two) times daily as needed. Irration bowels  30 tablet  2  . fentaNYL (DURAGESIC) 100 MCG/HR Place 1 patch onto the skin every 2 days      . fluticasone  (FLONASE) 50 MCG/ACT nasal spray Place 2 sprays into the nose daily.  16 g  3  . furosemide (LASIX) 20 MG tablet TAKE ONE TABLET BY MOUTH DAILY  30 tablet  5  . glucose blood (ONE TOUCH ULTRA TEST) test strip Use as instructed. Dx: 250.00  100 each  6  . lidocaine (LIDODERM) 5 % Place 1 patch onto the skin at bedtime.      Marland Kitchen losartan (COZAAR) 50 MG tablet TAKE 1 TABLET (50 MG TOTAL) BY MOUTH AT BEDTIME.  30 tablet  3  . metFORMIN (GLUCOPHAGE-XR) 500 MG 24 hr tablet TAKE 4 TABLETS (2,000 MG TOTAL) BY MOUTH DAILY WITH BREAKFAST.  120 tablet  2  . mirabegron ER (MYRBETRIQ) 50 MG TB24 Take 50 mg by mouth every evening.      Marland Kitchen NEXIUM 40 MG capsule TAKE 1 CAPSULE (40 MG TOTAL) BY MOUTH DAILY BEFORE BREAKFAST.  30 capsule  2  . oxyCODONE (OXY IR/ROXICODONE) 5 MG immediate release tablet Take 5 mg by mouth 3 (three) times daily.      Marland Kitchen POTASSIUM GLUCONATE PO Take 1 tablet by mouth as needed.      . promethazine (PHENERGAN) 25 MG tablet Take 25 mg by mouth at bedtime. Patient takes every day per patient      . rOPINIRole (REQUIP) 4 MG tablet TAKE 1 TABLET (4 MG TOTAL) BY MOUTH AT BEDTIME.  30 tablet  3  . tiZANidine (ZANAFLEX) 4 MG tablet Take 4 mg by mouth at bedtime.       No current facility-administered medications on file prior to visit.    BP 140/90  Pulse 86  Temp(Src) 98.1 F (36.7 C) (Oral)  Resp 16  Ht 5' 5.25" (1.657 m)  Wt 249 lb 1.3 oz (112.982 kg)  BMI 41.15 kg/m2  SpO2 98%       Objective:   Physical Exam  Constitutional: She is oriented to person, place, and time. She appears well-developed and well-nourished. No distress.  HENT:  Head: Normocephalic and atraumatic.  Right Ear: Tympanic membrane and ear canal normal.  Left Ear:  Tympanic membrane and ear canal normal.  No nasal lesions noted  Cardiovascular: Normal rate and regular rhythm.   No murmur heard. Pulmonary/Chest: Effort normal and breath sounds normal. No respiratory distress. She has no wheezes. She has no  rales. She exhibits no tenderness.  Musculoskeletal: She exhibits no edema.  Lymphadenopathy:    She has no cervical adenopathy.  Neurological: She is alert and oriented to person, place, and time.  Skin: Skin is warm and dry.  Psychiatric: She has a normal mood and affect. Her behavior is normal. Judgment and thought content normal.          Assessment & Plan:

## 2013-11-02 NOTE — Progress Notes (Signed)
Pre visit review using our clinic review tool, if applicable. No additional management support is needed unless otherwise documented below in the visit note. 

## 2013-11-02 NOTE — Assessment & Plan Note (Signed)
Fair BP control, continue current meds.

## 2013-11-02 NOTE — Assessment & Plan Note (Signed)
Obtain A1C. 

## 2013-11-02 NOTE — Assessment & Plan Note (Signed)
Will rx amoxicillin.  ? Cause for fevers.

## 2013-11-02 NOTE — Telephone Encounter (Signed)
Amlodipine refilled per protocol. JG//CMA 

## 2013-11-03 ENCOUNTER — Telehealth: Payer: Self-pay | Admitting: Family

## 2013-11-03 LAB — HEMOGLOBIN A1C
Hgb A1c MFr Bld: 7.9 % — ABNORMAL HIGH (ref <5.7)
Mean Plasma Glucose: 180 mg/dL — ABNORMAL HIGH (ref <117)

## 2013-11-03 MED ORDER — SITAGLIPTIN PHOSPHATE 100 MG PO TABS: 100.0000 mg | ORAL_TABLET | Freq: Every day | ORAL | Status: DC

## 2013-11-03 NOTE — Telephone Encounter (Signed)
Notified pt she voices understanding.

## 2013-11-03 NOTE — Telephone Encounter (Signed)
Sugar is uncontrolled. Add Januvia once daily. Work on diet, exercise, weight loss.

## 2013-11-03 NOTE — Telephone Encounter (Signed)
Patient returned phone call. Best # [phone]

## 2013-11-03 NOTE — Telephone Encounter (Signed)
Left message for pt to return my call.

## 2013-11-16 ENCOUNTER — Encounter: Payer: Self-pay | Admitting: Physician Assistant

## 2013-11-16 ENCOUNTER — Ambulatory Visit (INDEPENDENT_AMBULATORY_CARE_PROVIDER_SITE_OTHER): Payer: 59 | Admitting: Physician Assistant

## 2013-11-16 VITALS — BP 142/88 | HR 80 | Temp 99.1°F | Resp 18 | Ht 65.25 in | Wt 250.8 lb

## 2013-11-16 DIAGNOSIS — J209 Acute bronchitis, unspecified: Secondary | ICD-10-CM

## 2013-11-16 MED ORDER — HYDROCODONE-HOMATROPINE 5-1.5 MG/5ML PO SYRP: 5.0000 mL | ORAL_SOLUTION | Freq: Three times a day (TID) | ORAL | Status: DC | PRN

## 2013-11-16 MED ORDER — AZITHROMYCIN 250 MG PO TABS: ORAL_TABLET | ORAL | Status: DC

## 2013-11-16 NOTE — Patient Instructions (Signed)
Increase fluid intake.  Take medications as prescribed.  Rest.  Humidifier in bedroom.  Saline nasal spray.  Tylenol for fever and throat pain.  Chloraseptic spray and salt-water gargles for sore throat.  Please return to clinic if symptoms not improving as we will need to obtain an x-ray.

## 2013-11-17 DIAGNOSIS — J209 Acute bronchitis, unspecified: Secondary | ICD-10-CM | POA: Insufficient documentation

## 2013-11-17 NOTE — Progress Notes (Signed)
Patient ID: [name], female   DOB: Aug 24, 1956, 57 y.o.   MRN: [id number]  Patient presents to clinic today complaining of several days productive cough, chest congestion, sore throat and mild chest tightness. Patient endorses some fever. Patient had one episode of hemoptysis after a persistent coughing spell. No recurrence. Denies recent travel or sick contact. Patient has had some mild shortness of breath secondary to cough. Patient denies history of asthma, COPD or smoking. Has some seasonal allergies. Patient does have diagnoses was struck to sleep apnea.  Past Medical History  Diagnosis Date  . Diverticulosis   . Restless leg syndrome   . IBS (irritable bowel syndrome)   . IC (interstitial cystitis)   . Hypertension   . Diabetes mellitus     diet controlled  . Depression   . Fibromyalgia   . Anxiety   . Osteopenia   . Arthritis     in knees  . Dyslipidemia   . Chronic fatigue syndrome   . Chronic abdominal pain   . Esophageal reflux   . TIA (transient ischemic attack) 08/22/2012    Current Outpatient Prescriptions on File Prior to Visit  Medication Sig Dispense Refill  . ALPRAZolam (XANAX) 1 MG tablet Take 1 mg by mouth 3 (three) times daily as needed. For anxiety      . amLODipine (NORVASC) 5 MG tablet TAKE 1 TABLET (5 MG TOTAL) BY MOUTH AT BEDTIME.  30 tablet  2  . aspirin EC 325 MG tablet Take 325 mg by mouth daily.      Marland Kitchen desvenlafaxine (PRISTIQ) 50 MG 24 hr tablet Take 150 mg by mouth daily.      Marland Kitchen dicyclomine (BENTYL) 20 MG tablet Take 1 tablet (20 mg total) by mouth 2 (two) times daily as needed. Irration bowels  30 tablet  2  . esomeprazole (NEXIUM) 40 MG capsule TAKE 1 CAPSULE (40 MG TOTAL) BY MOUTH DAILY BEFORE BREAKFAST.  30 capsule  2  . fentaNYL (DURAGESIC) 100 MCG/HR Place 1 patch onto the skin every 2 days      . fluticasone (FLONASE) 50 MCG/ACT nasal spray Place 2 sprays into the nose daily.  16 g  3  . furosemide (LASIX) 20 MG tablet TAKE ONE TABLET BY  MOUTH DAILY  30 tablet  5  . gabapentin (NEURONTIN) 100 MG capsule TAKE 1 CAPSULE (100 MG TOTAL) BY MOUTH 3 (THREE) TIMES  daily.  (Pt only takes 1 a day)      . glucose blood (ONE TOUCH ULTRA TEST) test strip Use as instructed. Dx: 250.00  100 each  6  . lidocaine (LIDODERM) 5 % Place 1 patch onto the skin at bedtime.      Marland Kitchen losartan (COZAAR) 50 MG tablet TAKE 1 TABLET (50 MG TOTAL) BY MOUTH AT BEDTIME.  30 tablet  3  . metFORMIN (GLUCOPHAGE-XR) 500 MG 24 hr tablet TAKE 4 TABLETS (2,000 MG TOTAL) BY MOUTH DAILY WITH BREAKFAST.  120 tablet  2  . mirabegron ER (MYRBETRIQ) 50 MG TB24 Take 50 mg by mouth every evening.      Marland Kitchen oxyCODONE (OXY IR/ROXICODONE) 5 MG immediate release tablet Take 5 mg by mouth 3 (three) times daily.      Marland Kitchen POTASSIUM GLUCONATE PO Take 1 tablet by mouth as needed.      . promethazine (PHENERGAN) 25 MG tablet Take 25 mg by mouth at bedtime. Patient takes every day per patient      . rOPINIRole (REQUIP) 4 MG tablet  TAKE 1 TABLET (4 MG TOTAL) BY MOUTH AT BEDTIME.  30 tablet  3  . sitaGLIPtin (JANUVIA) 100 MG tablet Take 1 tablet (100 mg total) by mouth daily.  30 tablet  5  . tiZANidine (ZANAFLEX) 4 MG tablet Take 4 mg by mouth at bedtime.      . traZODone (DESYREL) 100 MG tablet Take 1 - 2 tablets at bedtime as needed.       No current facility-administered medications on file prior to visit.    Allergies  Allergen Reactions  . Chlorzoxazone     unknown  . Gabapentin     nausea  . Gemfibrozil     nausea  . Metronidazole     nausea  . Morphine And Related     vomiting  . Nitrofurantoin     nausea  . Olmesartan Medoxomil-Hctz     nausea  . Ondansetron Nausea And Vomiting  . Pentosan Polysulfate Sodium     unknown  . Propoxyphene N-Acetaminophen     nausea  . Ramipril     REACTION: cough  . Sulfonamide Derivatives     nausea    Family History  Problem Relation Age of Onset  . Breast cancer Mother   . Dementia Mother   . Breast cancer Maternal  Grandmother   . Throat cancer Maternal Grandmother   . Esophageal cancer Maternal Grandmother   . Breast cancer Other     maternal great aunts x 5  . Rheum arthritis Other   . Colon cancer Neg Hx   . Diabetes Father   . Dementia Father   . Diabetes Maternal Grandfather     History   Social History  . Marital Status: Divorced    Spouse Name: N/A    Number of Children: 2  . Years of Education: N/A   Occupational History  . disabled    Social History Main Topics  . Smoking status: Former Smoker -- 0.10 packs/day for 1 years    Types: Cigarettes    Quit date: 10/19/2012  . Smokeless tobacco: Never Used     Comment: using electronic cigarettes  . Alcohol Use: Yes     Comment: 2 drinks per month  . Drug Use: No  . Sexual Activity: None   Other Topics Concern  . None   Social History Narrative   Disabled   Divorced, lives alone - 1 dtr, 1 son    Review of Systems - See HPI.  All other ROS are negative.  Filed Vitals:   11/16/13 1602  BP: 142/88  Pulse: 80  Temp: 99.1 F (37.3 C)  Resp: 18   Physical Exam  Vitals reviewed. Constitutional: She is oriented to person, place, and time and well-developed, well-nourished, and in no distress.  HENT:  Head: Normocephalic and atraumatic.  Right Ear: External ear normal.  Left Ear: External ear normal.  Nose: Nose normal.  Mouth/Throat: Oropharynx is clear and moist. No oropharyngeal exudate.  Tympanic membranes within normal limits bilaterally. No tenderness to percussion of sinuses noted on examination. No evidence of tonsillar adenopathy, tonsillar exudate, or palatal petechiae noted on examination.  Eyes: Conjunctivae are normal. Pupils are equal, round, and reactive to light.  Neck: Neck supple.  Cardiovascular: Normal rate, regular rhythm, normal heart sounds and intact distal pulses.   Pulmonary/Chest: Effort normal and breath sounds normal. No respiratory distress. She has no wheezes. She has no rales. She  exhibits no tenderness.  Lymphadenopathy:    She has no cervical  adenopathy.  Neurological: She is alert and oriented to person, place, and time.  Skin: Skin is warm and dry. No rash noted.  Psychiatric: Affect normal.    Recent Results (from the past 2160 hour(s))  GLUCOSE, CAPILLARY     Status: Abnormal   Collection Time    09/03/13 12:45 PM      Result Value Range   Glucose-Capillary 132 (*) 70 - 99 mg/dL  GLUCOSE, CAPILLARY     Status: Abnormal   Collection Time    09/03/13  1:52 PM      Result Value Range   Glucose-Capillary 128 (*) 70 - 99 mg/dL  CBC WITH DIFFERENTIAL     Status: None   Collection Time    10/22/13  2:44 PM      Result Value Range   WBC 5.9  4.0 - 10.5 K/uL   RBC 4.76  3.87 - 5.11 MIL/uL   Hemoglobin 13.6  12.0 - 15.0 g/dL   HCT 29.5  62.1 - 30.8 %   MCV 83.2  78.0 - 100.0 fL   MCH 28.6  26.0 - 34.0 pg   MCHC 34.3  30.0 - 36.0 g/dL   RDW 65.7  84.6 - 96.2 %   Platelets 207  150 - 400 K/uL   Neutrophils Relative % 56  43 - 77 %   Neutro Abs 3.2  1.7 - 7.7 K/uL   Lymphocytes Relative 36  12 - 46 %   Lymphs Abs 2.1  0.7 - 4.0 K/uL   Monocytes Relative 7  3 - 12 %   Monocytes Absolute 0.4  0.1 - 1.0 K/uL   Eosinophils Relative 1  0 - 5 %   Eosinophils Absolute 0.1  0.0 - 0.7 K/uL   Basophils Relative 0  0 - 1 %   Basophils Absolute 0.0  0.0 - 0.1 K/uL   Smear Review Criteria for review not met    C-REACTIVE PROTEIN     Status: None   Collection Time    10/22/13  2:44 PM      Result Value Range   CRP <0.5  <0.60 mg/dL  SEDIMENTATION RATE     Status: None   Collection Time    10/22/13  2:44 PM      Result Value Range   Sed Rate 1  0 - 22 mm/hr  VITAMIN D PNL(25-HYDRXY+1,25-DIHY)-BLD     Status: None   Collection Time    10/22/13  2:44 PM      Result Value Range   Vitamin D 1, 25 (OH) Total 59  18 - 72 pg/mL   Vitamin D3 1, 25 (OH) 59     Vitamin D2 1, 25 (OH) <8     Comment: Vitamin D3, 1,25(OH)2 indicates both endogenous     production and  supplementation.  Vitamin D2, 1,25(OH)2     is an indicator of exogeous sources, such as diet or     supplementation.  Interpretation and therapy are based     on measurement of Vitamin D,1,25(OH)2, Total.     This test was developed and its performance     characteristics have been determined by [facility], [place], [place].     Performance characteristics refer to the     analytical performance of the test.   Vit D, 25-Hydroxy 35  30 - 89 ng/mL   Comment: This assay accurately quantifies Vitamin D, which is the sum of the  25-Hydroxy forms of Vitamin D2 and D3.  Studies have shown that the     optimum concentration of 25-Hydroxy Vitamin D is 30 ng/mL or higher.      Concentrations of Vitamin D between 20 and 29 ng/mL are considered to     be insufficient and concentrations less than 20 ng/mL are considered     to be deficient for Vitamin D.  COMPLETE METABOLIC PANEL WITH GFR     Status: Abnormal   Collection Time    10/22/13  2:56 PM      Result Value Range   Sodium 136  135 - 145 mEq/L   Potassium 4.7  3.5 - 5.3 mEq/L   Chloride 98  96 - 112 mEq/L   CO2 27  19 - 32 mEq/L   Glucose, Bld 208 (*) 70 - 99 mg/dL   BUN 15  6 - 23 mg/dL   Creat 4.69  6.29 - 5.28 mg/dL   Total Bilirubin 0.4  0.3 - 1.2 mg/dL   Alkaline Phosphatase 82  39 - 117 U/L   AST 23  0 - 37 U/L   ALT 31  0 - 35 U/L   Total Protein 6.5  6.0 - 8.3 g/dL   Albumin 4.1  3.5 - 5.2 g/dL   Calcium 9.6  8.4 - 41.3 mg/dL   GFR, Est African American >89     GFR, Est Non African American >89     Comment:       The estimated GFR is a calculation valid for adults (>=16 years old)     that uses the CKD-EPI algorithm to adjust for age and sex. It is       not to be used for children, pregnant women, hospitalized patients,        patients on dialysis, or with rapidly changing kidney function.     According to the NKDEP, eGFR >89 is normal, 60-89 shows mild     impairment, 30-59 shows moderate  impairment, 15-29 shows severe     impairment and <15 is ESRD.        HEMOGLOBIN A1C     Status: Abnormal   Collection Time    11/02/13  2:31 PM      Result Value Range   Hemoglobin A1C 7.9 (*) <5.7 %   Comment:                                                                            According to the ADA Clinical Practice Recommendations for 2011, when     HbA1c is used as a screening test:             >=6.5%   Diagnostic of Diabetes Mellitus                (if abnormal result is confirmed)           5.7-6.4%   Increased risk of developing Diabetes Mellitus           References:Diagnosis and Classification of Diabetes Mellitus,Diabetes     Care,2011,34(Suppl 1):S62-S69 and Standards of Medical Care in             Diabetes - 2011,Diabetes KGMW,1027,25 (Suppl  1):S11-S61.         Mean Plasma Glucose 180 (*) <117 mg/dL    Assessment/Plan: Acute bronchitis Rx azithromycin. Rx Hycodan. Increase fluid intake. Rest. Saline nasal spray. Humidifier in bedroom. Plan Mucinex. Chloraseptic spray and salt water gargles for sore throat. Tylenol for fever and sore throat. Please return to clinic if symptoms not improving or if fever reaches 102.

## 2013-11-17 NOTE — Assessment & Plan Note (Signed)
Rx azithromycin. Rx Hycodan. Increase fluid intake. Rest. Saline nasal spray. Humidifier in bedroom. Plan Mucinex. Chloraseptic spray and salt water gargles for sore throat. Tylenol for fever and sore throat. Please return to clinic if symptoms not improving or if fever reaches 102.

## 2013-11-26 ENCOUNTER — Telehealth: Payer: Self-pay | Admitting: Gastroenterology

## 2013-11-26 NOTE — Telephone Encounter (Signed)
Pt would like to switch care from Dr. Deatra Ina to Dr. Ardis Hughs due to personality conflict. Will you approve the switch? Please advise.

## 2013-11-26 NOTE — Telephone Encounter (Signed)
Ok, please schedule her for my next available new patient visit

## 2013-11-26 NOTE — Telephone Encounter (Signed)
ok 

## 2013-11-26 NOTE — Telephone Encounter (Signed)
Dr. Jacobs will you accept this pt? Please advise. 

## 2013-11-30 ENCOUNTER — Ambulatory Visit: Payer: 59 | Admitting: Gastroenterology

## 2013-12-08 ENCOUNTER — Other Ambulatory Visit: Payer: 59

## 2013-12-16 ENCOUNTER — Other Ambulatory Visit: Payer: 59

## 2013-12-23 ENCOUNTER — Ambulatory Visit: Payer: 59 | Admitting: Infectious Disease

## 2013-12-29 ENCOUNTER — Telehealth: Payer: Self-pay | Admitting: Family

## 2013-12-29 ENCOUNTER — Encounter: Payer: Self-pay | Admitting: Gastroenterology

## 2013-12-29 ENCOUNTER — Ambulatory Visit (INDEPENDENT_AMBULATORY_CARE_PROVIDER_SITE_OTHER): Payer: PRIVATE HEALTH INSURANCE | Admitting: Gastroenterology

## 2013-12-29 VITALS — BP 140/80 | HR 72 | Ht 65.25 in | Wt 248.8 lb

## 2013-12-29 DIAGNOSIS — R159 Full incontinence of feces: Secondary | ICD-10-CM

## 2013-12-29 MED ORDER — DICYCLOMINE HCL 20 MG PO TABS: 20.0000 mg | ORAL_TABLET | Freq: Two times a day (BID) | ORAL | Status: DC | PRN

## 2013-12-29 NOTE — Telephone Encounter (Signed)
Dr. Ardis Hughs thinks her metformin and Celesta Gentile are potentially causing her diarrhea.  I would like to see pt back in the office to discuss alternatives.

## 2013-12-29 NOTE — Patient Instructions (Addendum)
Increasing your glucophage and adding januvia seem probably related to your urgency, looser stools, fecal incontinence.   Will send note to [name] about the safety of changing your medicines and we'll try to get you in to see her sooner than 3/24th. Please return to see Dr. Ardis Hughs in 2-3 months or sooner if needed. New refills for bentyl was called into your pharmacy today.

## 2013-12-29 NOTE — Progress Notes (Signed)
HPI: This is a    very pleasant 58 year old woman whom I am meeting for the first time today.  She wanted to change care from Dr. Deatra Ina.  She wanted the results from her previous EGD 2-3 months ago.  Takes nexium one pill once daily, if she does not hen she will have pyrosis.  Having problems with fecal incontinence.  Very embarrassing.  1-2 times per week,  Very mucousy.  Can have significant urgency.  THis is new in the past month. No bleeding.  No changes in her diet recently.  Drinks 2 coffee.  Pretty rare etoh.  Glucophage was increased 3-4 months ago, from 3 to 4 pills per day.  She will wipe, use baby wipes.  August 2014 gastric emptying scan (while on narcotics?), Was slow. 43% gastric content was retained when less than 30 should be retained. August 2014 CAT scan was normal September 2012 colonoscopy Deatra Ina was normal October 2014 EGD Deatra Ina showed small erosion at her GE junction but was otherwise normal.  Hb A1C 2 months ago was 7.9   Review of systems: Pertinent positive and negative review of systems were noted in the above HPI section. Complete review of systems was performed and was otherwise normal.    Past Medical History  Diagnosis Date  . Diverticulosis   . Restless leg syndrome   . IBS (irritable bowel syndrome)   . IC (interstitial cystitis)   . Hypertension   . Diabetes mellitus     diet controlled  . Depression   . Fibromyalgia   . Anxiety   . Osteopenia   . Arthritis     in knees  . Dyslipidemia   . Chronic fatigue syndrome   . Chronic abdominal pain   . Esophageal reflux   . TIA (transient ischemic attack) 08/22/2012  . Obesity   . Sleep apnea     Past Surgical History  Procedure Laterality Date  . Total knee arthroplasty      bilateral  . Tonsillectomy and adenoidectomy    . Breast cyst excision  1978    left  . Laparotomy  1982    cyst removed from right ovary and removal of adhesions  . Abdominal exploration surgery  1983     diverticulum removed  . Appendectomy  1983    with exploration  . Laparoscopic incisional / umbilical / ventral hernia repair  1991, 2001, 2007    x 3  . Vaginal hysterectomy  1992  . Laparotomy  1991  . Scalp surgery  1993, 1998    Removal of tumor  . Nasal sinus surgery  2004  . Rectocele repair  2009  . Cervical fusion  2010  . Bladder hydrodistention      Current Outpatient Prescriptions  Medication Sig Dispense Refill  . ALPRAZolam (XANAX) 1 MG tablet Take 1 mg by mouth 3 (three) times daily as needed. For anxiety      . amLODipine (NORVASC) 5 MG tablet TAKE 1 TABLET (5 MG TOTAL) BY MOUTH AT BEDTIME.  30 tablet  2  . aspirin EC 325 MG tablet Take 325 mg by mouth daily.      Marland Kitchen azithromycin (ZITHROMAX) 250 MG tablet Take 2 tablets on Day 1.  Then take 1 tablet daily.  6 tablet  0  . desvenlafaxine (PRISTIQ) 50 MG 24 hr tablet Take 150 mg by mouth daily.      Marland Kitchen dicyclomine (BENTYL) 20 MG tablet Take 1 tablet (20 mg total) by mouth 2 (  two) times daily as needed. Irration bowels  30 tablet  2  . esomeprazole (NEXIUM) 40 MG capsule TAKE 1 CAPSULE (40 MG TOTAL) BY MOUTH DAILY BEFORE BREAKFAST.  30 capsule  2  . fentaNYL (DURAGESIC) 100 MCG/HR Place 1 patch onto the skin every 2 days      . fluticasone (FLONASE) 50 MCG/ACT nasal spray Place 2 sprays into the nose daily.  16 g  3  . furosemide (LASIX) 20 MG tablet TAKE ONE TABLET BY MOUTH DAILY  30 tablet  5  . gabapentin (NEURONTIN) 100 MG capsule TAKE 1 CAPSULE (100 MG TOTAL) BY MOUTH 3 (THREE) TIMES  daily.  (Pt only takes 1 a day)      . glucose blood (ONE TOUCH ULTRA TEST) test strip Use as instructed. Dx: 250.00  100 each  6  . HYDROcodone-homatropine (HYCODAN) 5-1.5 MG/5ML syrup Take 5 mLs by mouth every 8 (eight) hours as needed for cough.  120 mL  0  . lidocaine (LIDODERM) 5 % Place 1 patch onto the skin at bedtime.      Marland Kitchen losartan (COZAAR) 50 MG tablet TAKE 1 TABLET (50 MG TOTAL) BY MOUTH AT BEDTIME.  30 tablet  3  .  metFORMIN (GLUCOPHAGE-XR) 500 MG 24 hr tablet TAKE 4 TABLETS (2,000 MG TOTAL) BY MOUTH DAILY WITH BREAKFAST.  120 tablet  2  . mirabegron ER (MYRBETRIQ) 50 MG TB24 Take 50 mg by mouth every evening.      Marland Kitchen oxyCODONE (OXY IR/ROXICODONE) 5 MG immediate release tablet Take 5 mg by mouth 3 (three) times daily.      Marland Kitchen POTASSIUM GLUCONATE PO Take 1 tablet by mouth as needed.      . promethazine (PHENERGAN) 25 MG tablet Take 25 mg by mouth at bedtime. Patient takes every day per patient      . rOPINIRole (REQUIP) 4 MG tablet TAKE 1 TABLET (4 MG TOTAL) BY MOUTH AT BEDTIME.  30 tablet  3  . sitaGLIPtin (JANUVIA) 100 MG tablet Take 1 tablet (100 mg total) by mouth daily.  30 tablet  5  . tiZANidine (ZANAFLEX) 4 MG tablet Take 4 mg by mouth at bedtime.      . traZODone (DESYREL) 100 MG tablet Take 1 - 2 tablets at bedtime as needed.      Marland Kitchen UNABLE TO FIND C-Pap machine , use nightly       No current facility-administered medications for this visit.    Allergies as of 12/29/2013 - Review Complete 12/29/2013  Allergen Reaction Noted  . Chlorzoxazone    . Gabapentin    . Gemfibrozil  09/22/2008  . Metronidazole    . Morphine and related  05/15/2011  . Nitrofurantoin    . Olmesartan medoxomil-hctz    . Ondansetron Nausea And Vomiting 07/16/2012  . Pentosan polysulfate sodium    . Propoxyphene n-acetaminophen    . Ramipril    . Sulfonamide derivatives      Family History  Problem Relation Age of Onset  . Breast cancer Mother   . Dementia Mother   . Breast cancer Maternal Grandmother   . Throat cancer Maternal Grandmother   . Esophageal cancer Maternal Grandmother   . Breast cancer Other     maternal great aunts x 5  . Rheum arthritis Other   . Colon cancer Neg Hx   . Diabetes Father   . Dementia Father   . Diabetes Maternal Grandfather     History   Social History  .  Marital Status: Divorced    Spouse Name: N/A    Number of Children: 2  . Years of Education: N/A   Occupational  History  . disabled    Social History Main Topics  . Smoking status: Former Smoker -- 0.10 packs/day for 1 years    Types: Cigarettes    Quit date: 10/19/2012  . Smokeless tobacco: Never Used     Comment: using electronic cigarettes  . Alcohol Use: Yes     Comment: 2 drinks per month  . Drug Use: No  . Sexual Activity: Not on file   Other Topics Concern  . Not on file   Social History Narrative   Disabled   Divorced, lives alone - 1 dtr, 1 son       Physical Exam: BP 140/80  Pulse 72  Ht 5' 5.25" (1.657 m)  Wt 248 lb 12.8 oz (112.855 kg)  BMI 41.10 kg/m2 Constitutional: generally well-appearing Psychiatric: alert and oriented x3 Eyes: extraocular movements intact Mouth: oral pharynx moist, no lesions Neck: supple no lymphadenopathy Cardiovascular: heart regular rate and rhythm Lungs: clear to auscultation bilaterally Abdomen: soft, nontender, nondistended, no obvious ascites, no peritoneal signs, normal bowel sounds Extremities: no lower extremity edema bilaterally Skin: no lesions on visible extremities  rectal examination with female assistant in the room: No external anal hemorrhoids, no internal rectal masses, stool is brown and not check for Hemoccult, no anal fissures, rectal tone was normal   Assessment and plan: 58 y.o. female with  intermittent fecal incontinence  The timing of her looser than usual stools as well as fecal incontinence seems to correlate with increases in her diabetes medicines specifically Glucophage and Januvia. Both of those medicines are well known to cause loose stools, diarrhea. I will send a copy of this note her primary caregiver in order that she could consider cutting back on those medicines. Obviously this is not ideal for her diabetes care since her recent hemoglobin A1c was 7.9 but perhaps alternatives could be tried. She will return to see me in 2-3 months and sooner if needed.

## 2013-12-29 NOTE — Telephone Encounter (Signed)
Left message on home # to return my call. 

## 2013-12-30 NOTE — Telephone Encounter (Signed)
Left message on home # to return my call. 

## 2014-01-01 NOTE — Telephone Encounter (Signed)
Notified pt and scheduled follow up for 01/15/14 @ 1:30pm.

## 2014-01-06 ENCOUNTER — Ambulatory Visit: Payer: PRIVATE HEALTH INSURANCE | Admitting: Infectious Disease

## 2014-01-06 ENCOUNTER — Other Ambulatory Visit: Payer: Self-pay | Admitting: Family

## 2014-01-13 ENCOUNTER — Other Ambulatory Visit: Payer: Self-pay | Admitting: Family

## 2014-01-15 ENCOUNTER — Ambulatory Visit: Payer: PRIVATE HEALTH INSURANCE | Admitting: Family

## 2014-01-21 ENCOUNTER — Ambulatory Visit: Payer: PRIVATE HEALTH INSURANCE | Admitting: Infectious Disease

## 2014-01-28 ENCOUNTER — Other Ambulatory Visit: Payer: Self-pay | Admitting: Family

## 2014-02-06 ENCOUNTER — Other Ambulatory Visit: Payer: Self-pay | Admitting: Family

## 2014-02-09 ENCOUNTER — Encounter: Payer: Self-pay | Admitting: Family

## 2014-02-09 ENCOUNTER — Telehealth: Payer: Self-pay | Admitting: Family

## 2014-02-09 ENCOUNTER — Ambulatory Visit (INDEPENDENT_AMBULATORY_CARE_PROVIDER_SITE_OTHER): Payer: PRIVATE HEALTH INSURANCE | Admitting: Family

## 2014-02-09 VITALS — BP 146/80 | HR 89 | Temp 98.0°F | Resp 16 | Ht 65.25 in | Wt 251.0 lb

## 2014-02-09 DIAGNOSIS — N3010 Interstitial cystitis (chronic) without hematuria: Secondary | ICD-10-CM

## 2014-02-09 DIAGNOSIS — E785 Hyperlipidemia, unspecified: Secondary | ICD-10-CM

## 2014-02-09 DIAGNOSIS — N63 Unspecified lump in unspecified breast: Secondary | ICD-10-CM

## 2014-02-09 DIAGNOSIS — N6310 Unspecified lump in the right breast, unspecified quadrant: Secondary | ICD-10-CM

## 2014-02-09 DIAGNOSIS — I10 Essential (primary) hypertension: Secondary | ICD-10-CM

## 2014-02-09 DIAGNOSIS — E119 Type 2 diabetes mellitus without complications: Secondary | ICD-10-CM

## 2014-02-09 LAB — LIPID PANEL
CHOLESTEROL: 199 mg/dL (ref 0–200)
HDL: 62 mg/dL (ref >39)
LDL Cholesterol: 111 mg/dL — ABNORMAL HIGH (ref 0–99)
Total CHOL/HDL Ratio: 3.2 Ratio
Triglycerides: 132 mg/dL (ref <150)
VLDL: 26 mg/dL (ref 0–40)

## 2014-02-09 LAB — HEMOGLOBIN A1C
Hgb A1c MFr Bld: 6.8 % — ABNORMAL HIGH (ref <5.7)
Mean Plasma Glucose: 148 mg/dL — ABNORMAL HIGH (ref <117)

## 2014-02-09 LAB — BASIC METABOLIC PANEL WITH GFR
BUN: 14 mg/dL (ref 6–23)
CALCIUM: 9.7 mg/dL (ref 8.4–10.5)
CO2: 30 mEq/L (ref 19–32)
Chloride: 100 mEq/L (ref 96–112)
Creat: 0.68 mg/dL (ref 0.50–1.10)
GFR, Est African American: >89 mL/min
Glucose, Bld: 115 mg/dL — ABNORMAL HIGH (ref 70–99)
POTASSIUM: 4.8 meq/L (ref 3.5–5.3)
SODIUM: 137 meq/L (ref 135–145)

## 2014-02-09 MED ORDER — AMLODIPINE BESYLATE 10 MG PO TABS: 10.0000 mg | ORAL_TABLET | Freq: Every day | ORAL | Status: DC

## 2014-02-09 NOTE — Progress Notes (Signed)
Pre visit review using our clinic review tool, if applicable. No additional management support is needed unless otherwise documented below in the visit note. 

## 2014-02-09 NOTE — Assessment & Plan Note (Signed)
Check follow up Lipid panel

## 2014-02-09 NOTE — Assessment & Plan Note (Signed)
Obtain follow up bmet, a1c, urine microalbumin. Pt has upcoming apt for eye exam.

## 2014-02-09 NOTE — Assessment & Plan Note (Signed)
Uncontrolled. Increase amlodipine from 5mg  to 10mg .

## 2014-02-09 NOTE — Progress Notes (Signed)
Subjective:    Patient ID: [name], female    DOB: 20-Apr-1956, 58 y.o.   MRN: [id number]  HPI  Ms. Pair is a 58 yr old female who presents today for follow up.   Diabetes- currently maintained on januvia and metformin. Last A1C was above goal at 7.9. Reports sugars are lower since starting januvia. Denies hypoglycemic events.    Hypertension- BP meds include losartan, amlodipine.  BP Readings from Last 3 Encounters:  02/09/14 146/80  12/29/13 140/80  11/16/13 142/88  She denies CP sob or swelling.  Hyperlipidemia- Not currently maintained on statin. Last LDL was at goal.    IC- notes some bladder pressure.  Thinks it is a "flare up."   Review of Systems See HPI  Past Medical History  Diagnosis Date  . Diverticulosis   . Restless leg syndrome   . IBS (irritable bowel syndrome)   . IC (interstitial cystitis)   . Hypertension   . Diabetes mellitus     diet controlled  . Depression   . Fibromyalgia   . Anxiety   . Osteopenia   . Arthritis     in knees  . Dyslipidemia   . Chronic fatigue syndrome   . Chronic abdominal pain   . Esophageal reflux   . TIA (transient ischemic attack) 08/22/2012  . Obesity   . Sleep apnea     History   Social History  . Marital Status: Divorced    Spouse Name: N/A    Number of Children: 2  . Years of Education: N/A   Occupational History  . disabled    Social History Main Topics  . Smoking status: Former Smoker -- 0.10 packs/day for 1 years    Types: Cigarettes    Quit date: 10/19/2012  . Smokeless tobacco: Never Used     Comment: using electronic cigarettes  . Alcohol Use: Yes     Comment: 2 drinks per month  . Drug Use: No  . Sexual Activity: Not on file   Other Topics Concern  . Not on file   Social History Narrative   Disabled   Divorced, lives alone - 1 dtr, 1 son    Past Surgical History  Procedure Laterality Date  . Total knee arthroplasty      bilateral  . Tonsillectomy and adenoidectomy     . Breast cyst excision  1978    left  . Laparotomy  1982    cyst removed from right ovary and removal of adhesions  . Abdominal exploration surgery  1983    diverticulum removed  . Appendectomy  1983    with exploration  . Laparoscopic incisional / umbilical / ventral hernia repair  1991, 2001, 2007    x 3  . Vaginal hysterectomy  1992  . Laparotomy  1991  . Scalp surgery  1993, 1998    Removal of tumor  . Nasal sinus surgery  2004  . Rectocele repair  2009  . Cervical fusion  2010  . Bladder hydrodistention      Family History  Problem Relation Age of Onset  . Breast cancer Mother   . Dementia Mother   . Breast cancer Maternal Grandmother   . Throat cancer Maternal Grandmother   . Esophageal cancer Maternal Grandmother   . Breast cancer Other     maternal great aunts x 5  . Rheum arthritis Other   . Colon cancer Neg Hx   . Diabetes Father   . Dementia Father   .  Diabetes Maternal Grandfather     Allergies  Allergen Reactions  . Chlorzoxazone     unknown  . Gabapentin     nausea  . Gemfibrozil     nausea  . Metronidazole     nausea  . Morphine And Related     vomiting  . Nitrofurantoin     nausea  . Olmesartan Medoxomil-Hctz     nausea  . Ondansetron Nausea And Vomiting  . Pentosan Polysulfate Sodium     unknown  . Propoxyphene N-Acetaminophen     nausea  . Ramipril     REACTION: cough  . Sulfonamide Derivatives     nausea    Current Outpatient Prescriptions on File Prior to Visit  Medication Sig Dispense Refill  . ALPRAZolam (XANAX) 1 MG tablet Take 1 mg by mouth 3 (three) times daily as needed. For anxiety      . amLODipine (NORVASC) 5 MG tablet TAKE 1 TABLET (5 MG TOTAL) BY MOUTH AT BEDTIME.  30 tablet  2  . aspirin EC 325 MG tablet Take 325 mg by mouth daily.      Marland Kitchen desvenlafaxine (PRISTIQ) 50 MG 24 hr tablet Take 150 mg by mouth daily.      Marland Kitchen dicyclomine (BENTYL) 20 MG tablet Take 1 tablet (20 mg total) by mouth 2 (two) times daily as  needed. Irration bowels  60 tablet  11  . fentaNYL (DURAGESIC) 100 MCG/HR Place 1 patch onto the skin every 2 days      . fluticasone (FLONASE) 50 MCG/ACT nasal spray Place 2 sprays into the nose daily.  16 g  3  . furosemide (LASIX) 20 MG tablet TAKE ONE TABLET BY MOUTH DAILY  30 tablet  5  . gabapentin (NEURONTIN) 100 MG capsule TAKE 1 CAPSULE (100 MG TOTAL) BY MOUTH 3 (THREE) TIMES  daily.  (Pt only takes 1 a day)      . glucose blood (ONE TOUCH ULTRA TEST) test strip Use as instructed. Dx: 250.00  100 each  6  . lidocaine (LIDODERM) 5 % Place 1 patch onto the skin at bedtime.      Marland Kitchen losartan (COZAAR) 50 MG tablet TAKE 1 TABLET (50 MG TOTAL) BY MOUTH AT BEDTIME.  30 tablet  3  . metFORMIN (GLUCOPHAGE-XR) 500 MG 24 hr tablet TAKE 4 TABLETS (2,000 MG TOTAL) BY MOUTH DAILY WITH BREAKFAST.  120 tablet  2  . mirabegron ER (MYRBETRIQ) 50 MG TB24 Take 50 mg by mouth every evening.      Marland Kitchen NEXIUM 40 MG capsule TAKE 1 CAPSULE (40 MG TOTAL) BY MOUTH DAILY BEFORE BREAKFAST.  30 capsule  2  . oxyCODONE (OXY IR/ROXICODONE) 5 MG immediate release tablet Take 5 mg by mouth 3 (three) times daily.      Marland Kitchen POTASSIUM GLUCONATE PO Take 1 tablet by mouth as needed.      . promethazine (PHENERGAN) 25 MG tablet Take 25 mg by mouth at bedtime. Patient takes every day per patient      . rOPINIRole (REQUIP) 4 MG tablet TAKE 1 TABLET (4 MG TOTAL) BY MOUTH AT BEDTIME.  30 tablet  3  . sitaGLIPtin (JANUVIA) 100 MG tablet Take 1 tablet (100 mg total) by mouth daily.  30 tablet  5  . tiZANidine (ZANAFLEX) 4 MG tablet Take 4 mg by mouth at bedtime.      . traZODone (DESYREL) 100 MG tablet Take 1 - 2 tablets at bedtime as needed.      Marland Kitchen  UNABLE TO FIND C-Pap machine , use nightly       No current facility-administered medications on file prior to visit.    BP 146/80  Pulse 89  Temp(Src) 98 F (36.7 C) (Oral)  Resp 16  Ht 5' 5.25" (1.657 m)  Wt 251 lb (113.853 kg)  BMI 41.47 kg/m2  SpO2 98%       Objective:    Physical Exam  Constitutional: She is oriented to person, place, and time. She appears well-developed and well-nourished. No distress.  HENT:  Head: Normocephalic and atraumatic.  Cardiovascular: Normal rate and regular rhythm.   No murmur heard. Pulmonary/Chest: Breath sounds normal. No respiratory distress. She has no wheezes. She has no rales. She exhibits no tenderness.  Musculoskeletal: She exhibits no edema.  Neurological: She is alert and oriented to person, place, and time.  Psychiatric: She has a normal mood and affect. Her behavior is normal. Judgment and thought content normal.          Assessment & Plan:

## 2014-02-09 NOTE — Assessment & Plan Note (Signed)
Check UA and culture due to bladder symptoms.

## 2014-02-09 NOTE — Patient Instructions (Signed)
Please complete lab work prior to leaving. Increase amlodipine from 5 -10mg .  Follow up in 1 month.

## 2014-02-09 NOTE — Telephone Encounter (Signed)
Relevant patient education assigned to patient using Emmi. ° °

## 2014-02-10 LAB — URINE CULTURE
COLONY COUNT: NO GROWTH
ORGANISM ID, BACTERIA: NO GROWTH

## 2014-02-10 LAB — URINALYSIS, MICROSCOPIC ONLY
Bacteria, UA: NONE SEEN
Casts: NONE SEEN
Crystals: NONE SEEN
Squamous Epithelial / LPF: NONE SEEN

## 2014-02-10 LAB — URINALYSIS, ROUTINE W REFLEX MICROSCOPIC
Bilirubin Urine: NEGATIVE
HGB URINE DIPSTICK: NEGATIVE
Ketones, ur: NEGATIVE mg/dL
Leukocytes, UA: NEGATIVE
Nitrite: NEGATIVE
Protein, ur: NEGATIVE mg/dL
Specific Gravity, Urine: 1.024 (ref 1.005–1.030)
Urobilinogen, UA: 0.2 mg/dL (ref 0.0–1.0)
pH: 5.5 (ref 5.0–8.0)

## 2014-02-10 LAB — MICROALBUMIN / CREATININE URINE RATIO
Creatinine, Urine: 121.9 mg/dL
Microalb Creat Ratio: 4.7 mg/g (ref 0.0–30.0)
Microalb, Ur: 0.57 mg/dL (ref 0.00–1.89)

## 2014-02-11 ENCOUNTER — Encounter: Payer: Self-pay | Admitting: Family

## 2014-02-16 ENCOUNTER — Telehealth: Payer: Self-pay

## 2014-02-16 NOTE — Telephone Encounter (Signed)
Relevant patient education assigned to patient using Emmi. ° °

## 2014-02-25 ENCOUNTER — Other Ambulatory Visit: Payer: Self-pay | Admitting: Family

## 2014-03-16 ENCOUNTER — Ambulatory Visit: Payer: PRIVATE HEALTH INSURANCE | Admitting: Family

## 2014-03-29 ENCOUNTER — Ambulatory Visit (HOSPITAL_BASED_OUTPATIENT_CLINIC_OR_DEPARTMENT_OTHER)
Admission: RE | Admit: 2014-03-29 | Discharge: 2014-03-29 | Disposition: A | Discharge status: Home / Self Care | Payer: PRIVATE HEALTH INSURANCE | Source: Ambulatory Visit | Attending: Family | Admitting: Family

## 2014-03-29 ENCOUNTER — Telehealth: Payer: Self-pay | Admitting: Family

## 2014-03-29 ENCOUNTER — Ambulatory Visit (INDEPENDENT_AMBULATORY_CARE_PROVIDER_SITE_OTHER): Payer: PRIVATE HEALTH INSURANCE | Admitting: Family

## 2014-03-29 ENCOUNTER — Encounter: Payer: Self-pay | Admitting: Family

## 2014-03-29 VITALS — BP 126/84 | HR 77 | Temp 98.3°F | Resp 18 | Ht 65.25 in | Wt 247.1 lb

## 2014-03-29 DIAGNOSIS — L538 Other specified erythematous conditions: Secondary | ICD-10-CM

## 2014-03-29 DIAGNOSIS — M25579 Pain in unspecified ankle and joints of unspecified foot: Secondary | ICD-10-CM

## 2014-03-29 DIAGNOSIS — L304 Erythema intertrigo: Secondary | ICD-10-CM

## 2014-03-29 DIAGNOSIS — M79609 Pain in unspecified limb: Secondary | ICD-10-CM | POA: Insufficient documentation

## 2014-03-29 DIAGNOSIS — M79673 Pain in unspecified foot: Secondary | ICD-10-CM

## 2014-03-29 DIAGNOSIS — Z0000 Encounter for general adult medical examination without abnormal findings: Secondary | ICD-10-CM

## 2014-03-29 DIAGNOSIS — R232 Flushing: Secondary | ICD-10-CM

## 2014-03-29 DIAGNOSIS — I10 Essential (primary) hypertension: Secondary | ICD-10-CM

## 2014-03-29 DIAGNOSIS — N951 Menopausal and female climacteric states: Secondary | ICD-10-CM

## 2014-03-29 MED ORDER — FUROSEMIDE 20 MG PO TABS: ORAL_TABLET | ORAL | Status: DC

## 2014-03-29 MED ORDER — NYSTATIN 100000 UNIT/GM EX POWD: Freq: Two times a day (BID) | CUTANEOUS | Status: DC

## 2014-03-29 NOTE — Progress Notes (Signed)
Pre visit review using our clinic review tool, if applicable. No additional management support is needed unless otherwise documented below in the visit note. 

## 2014-03-29 NOTE — Telephone Encounter (Signed)
Foot xray neg for fracture but does note some bone thinning. I would recommend bone density(pended below).

## 2014-03-29 NOTE — Assessment & Plan Note (Signed)
Trial of nystatin powder  

## 2014-03-29 NOTE — Assessment & Plan Note (Signed)
BP Readings from Last 3 Encounters:  03/29/14 126/84  02/09/14 146/80  12/29/13 140/80   BP improved, continue amlodipine.

## 2014-03-29 NOTE — Assessment & Plan Note (Signed)
Will check x ray to further evaluate.

## 2014-03-29 NOTE — Progress Notes (Signed)
Subjective:    Patient ID: [name], female    DOB: 1956/08/12, 58 y.o.   MRN: [id number]  HPI  [name] is a 58 yr old female who presents today for follow up.  1)HTN-  Last visit her amlodipine was increased from 5mg  to 10mg . She reports tolerating this does without difficulty.   2) HA- reports headaches are becoming more frequent. Reports that she would not characterize these headaches as migraines.  Attributes to stress.  Mother had surgery- had a colon perf.  Yesterday her dad had a fall, broke his wrist.    3) Hot flashes- could not tolerate gabapentin due to sleepiness. Did not help her hot flashes.   4) Skin Rash- beneath her breasts.  5) Some pain left foot- hx of stress fracture.  Denies known fracture.     Review of Systems See HPI  Past Medical History  Diagnosis Date  . Diverticulosis   . Restless leg syndrome   . IBS (irritable bowel syndrome)   . IC (interstitial cystitis)   . Hypertension   . Diabetes mellitus     diet controlled  . Depression   . Fibromyalgia   . Anxiety   . Osteopenia   . Arthritis     in knees  . Dyslipidemia   . Chronic fatigue syndrome   . Chronic abdominal pain   . Esophageal reflux   . TIA (transient ischemic attack) 08/22/2012  . Obesity   . Sleep apnea     History   Social History  . Marital Status: Divorced    Spouse Name: N/A    Number of Children: 2  . Years of Education: N/A   Occupational History  . disabled    Social History Main Topics  . Smoking status: Former Smoker -- 0.10 packs/day for 1 years    Types: Cigarettes    Quit date: 10/19/2012  . Smokeless tobacco: Never Used     Comment: using electronic cigarettes  . Alcohol Use: Yes     Comment: 2 drinks per month  . Drug Use: No  . Sexual Activity: Not on file   Other Topics Concern  . Not on file   Social History Narrative   Disabled   Divorced, lives alone - 1 dtr, 1 son    Past Surgical History  Procedure Laterality Date    . Total knee arthroplasty      bilateral  . Tonsillectomy and adenoidectomy    . Breast cyst excision  1978    left  . Laparotomy  1982    cyst removed from right ovary and removal of adhesions  . Abdominal exploration surgery  1983    diverticulum removed  . Appendectomy  1983    with exploration  . Laparoscopic incisional / umbilical / ventral hernia repair  1991, 2001, 2007    x 3  . Vaginal hysterectomy  1992  . Laparotomy  1991  . Scalp surgery  1993, 1998    Removal of tumor  . Nasal sinus surgery  2004  . Rectocele repair  2009  . Cervical fusion  2010  . Bladder hydrodistention      Family History  Problem Relation Age of Onset  . Breast cancer Mother   . Dementia Mother   . Breast cancer Maternal Grandmother   . Throat cancer Maternal Grandmother   . Esophageal cancer Maternal Grandmother   . Breast cancer Other     maternal great aunts x 5  .  Rheum arthritis Other   . Colon cancer Neg Hx   . Diabetes Father   . Dementia Father   . Diabetes Maternal Grandfather     Allergies  Allergen Reactions  . Chlorzoxazone     unknown  . Gabapentin     nausea  . Gemfibrozil     nausea  . Metronidazole     nausea  . Morphine And Related     vomiting  . Nitrofurantoin     nausea  . Olmesartan Medoxomil-Hctz     nausea  . Ondansetron Nausea And Vomiting  . Pentosan Polysulfate Sodium     unknown  . Propoxyphene N-Acetaminophen     nausea  . Ramipril     REACTION: cough  . Sulfonamide Derivatives     nausea    Current Outpatient Prescriptions on File Prior to Visit  Medication Sig Dispense Refill  . ALPRAZolam (XANAX) 1 MG tablet Take 1 mg by mouth 3 (three) times daily as needed. For anxiety      . amLODipine (NORVASC) 10 MG tablet Take 1 tablet (10 mg total) by mouth daily.  30 tablet  1  . aspirin EC 325 MG tablet Take 325 mg by mouth daily.      Marland Kitchen desvenlafaxine (PRISTIQ) 50 MG 24 hr tablet Take 150 mg by mouth daily.      Marland Kitchen dicyclomine (BENTYL)  20 MG tablet Take 1 tablet (20 mg total) by mouth 2 (two) times daily as needed. Irration bowels  60 tablet  11  . fentaNYL (DURAGESIC) 100 MCG/HR Place 1 patch onto the skin every 2 days      . fluticasone (FLONASE) 50 MCG/ACT nasal spray Place 2 sprays into the nose daily.  16 g  3  . furosemide (LASIX) 20 MG tablet TAKE ONE TABLET BY MOUTH DAILY  30 tablet  5  . glucose blood (ONE TOUCH ULTRA TEST) test strip Use as instructed. Dx: 250.00  100 each  6  . lidocaine (LIDODERM) 5 % Place 1 patch onto the skin at bedtime.      Marland Kitchen losartan (COZAAR) 50 MG tablet TAKE 1 TABLET (50 MG TOTAL) BY MOUTH AT BEDTIME.  30 tablet  3  . metFORMIN (GLUCOPHAGE-XR) 500 MG 24 hr tablet TAKE 4 TABLETS (2,000 MG TOTAL) BY MOUTH DAILY WITH BREAKFAST.  120 tablet  2  . mirabegron ER (MYRBETRIQ) 50 MG TB24 Take 50 mg by mouth every evening.      Marland Kitchen NEXIUM 40 MG capsule TAKE 1 CAPSULE (40 MG TOTAL) BY MOUTH DAILY BEFORE BREAKFAST.  30 capsule  2  . oxyCODONE (OXY IR/ROXICODONE) 5 MG immediate release tablet Take 5 mg by mouth 3 (three) times daily.      Marland Kitchen POTASSIUM GLUCONATE PO Take 1 tablet by mouth as needed.      . promethazine (PHENERGAN) 25 MG tablet Take 25 mg by mouth at bedtime. Patient takes every day per patient      . rOPINIRole (REQUIP) 4 MG tablet TAKE 1 TABLET (4 MG TOTAL) BY MOUTH AT BEDTIME.  30 tablet  3  . sitaGLIPtin (JANUVIA) 100 MG tablet Take 1 tablet (100 mg total) by mouth daily.  30 tablet  5  . tiZANidine (ZANAFLEX) 4 MG tablet Take 4 mg by mouth at bedtime.      . traZODone (DESYREL) 100 MG tablet Take 1 - 2 tablets at bedtime as needed.      Marland Kitchen UNABLE TO FIND C-Pap machine , use nightly      .  gabapentin (NEURONTIN) 100 MG capsule TAKE 1 CAPSULE (100 MG TOTAL) BY MOUTH 3 (THREE) TIMES  daily.  (Pt only takes 1 a day)       No current facility-administered medications on file prior to visit.    BP 126/84  Pulse 77  Temp(Src) 98.3 F (36.8 C) (Oral)  Resp 18  Ht 5' 5.25" (1.657 m)  Wt  247 lb 1.3 oz (112.075 kg)  BMI 40.82 kg/m2  SpO2 97%       Objective:   Physical Exam  Constitutional: She is oriented to person, place, and time. She appears well-developed and well-nourished. No distress.  HENT:  Head: Normocephalic and atraumatic.  Cardiovascular: Normal rate and regular rhythm.   No murmur heard. Pulmonary/Chest: Effort normal and breath sounds normal. No respiratory distress. She has no wheezes. She has no rales. She exhibits no tenderness.  Lymphadenopathy:    She has no cervical adenopathy.  Neurological: She is alert and oriented to person, place, and time.  Skin:  Intertriginous rash beneath bilateral breasts  Psychiatric: She has a normal mood and affect. Her behavior is normal. Judgment and thought content normal.          Assessment & Plan:

## 2014-03-29 NOTE — Patient Instructions (Addendum)
Please complete x ray on the first floor.   Start nystatin for your rash. Stop gabapentin. Continue amlodipine (norvasc) 10mg . Please schedule a follow up appointment in 2 months.

## 2014-03-29 NOTE — Telephone Encounter (Signed)
Notified pt and she voices understanding and is agreeable to proceed with DEXA.

## 2014-03-29 NOTE — Assessment & Plan Note (Signed)
Not improved with gabapentin. D/C gabapentin.  Monitor.

## 2014-03-31 ENCOUNTER — Telehealth: Payer: Self-pay | Admitting: *Deleted

## 2014-03-31 MED ORDER — TOPIRAMATE 50 MG PO TABS: 50.0000 mg | ORAL_TABLET | Freq: Two times a day (BID) | ORAL | Status: DC

## 2014-03-31 NOTE — Telephone Encounter (Signed)
Received fax from [facility] that pt was expecting Rx of topiramate. Was previously on 50mg  twice a day.  Please advise.

## 2014-04-03 ENCOUNTER — Other Ambulatory Visit: Payer: Self-pay | Admitting: Family

## 2014-04-20 ENCOUNTER — Other Ambulatory Visit: Payer: Self-pay | Admitting: Family

## 2014-04-24 ENCOUNTER — Other Ambulatory Visit: Payer: Self-pay | Admitting: Family

## 2014-04-28 ENCOUNTER — Other Ambulatory Visit: Payer: Self-pay | Admitting: Family

## 2014-05-03 ENCOUNTER — Telehealth: Payer: Self-pay | Admitting: Family

## 2014-05-03 MED ORDER — SITAGLIPTIN PHOSPHATE 100 MG PO TABS: 100.0000 mg | ORAL_TABLET | Freq: Every day | ORAL | Status: DC

## 2014-05-03 NOTE — Telephone Encounter (Signed)
[facility]  [phone]

## 2014-05-03 NOTE — Telephone Encounter (Signed)
Rx sent to [facility] per [facility] protocol.

## 2014-05-14 ENCOUNTER — Telehealth: Payer: Self-pay | Admitting: *Deleted

## 2014-05-14 NOTE — Telephone Encounter (Signed)
Spoke with pt. She states that she slept most of today and did not follow advice given by call a nurse. Advised pt she should proceed to the nearest urgent care for evaluation today. Pt voices understanding.  [facility] Triage Call Report Triage Record Num: [id number] Operator: Feliberto Harts Patient Name: [name] Call Date & Time: 05/13/2014 7:19:30PM Patient Phone: [phone] PCP: [name], NP Patient Gender: Female PCP Fax : [phone] Patient DOB: 03-21-1956 Practice Name: [facility] Reason for Call: Caller: [id number]; PCP: Inda Castle, Melissa (Adults only); CB#: [phone]; Call regarding Sun poisoning; Has been putting Aloe on it. Got sun poisoning on 6/24 but was in sun 6/23. Itching is getting worse. Red Rash is from knees down and lower legs are tight/swollen. Temp 99.6 oral. States used 50 sunscreen. Had hysterectomy age 89. Triaged per Rash guideline. Disposition See Provider within 24 hrs per Generalized rash and not responding to 8 hrs home care. Care advice given and voices understanding. Protocol(s) Used: Rash Recommended Outcome per Protocol: See Provider within 24 hours Reason for Outcome: Generalized rash AND not responding to 8 hours of home care Care Advice: ~ Call provider if symptoms worsen or new symptoms develop. ~ Wear loose-fitting, non-restricting clothing. Cool/tepid showers or baths may help relieve itching. If cool water alone does not relieve itching, try adding 1/2 to 1 cup baking soda or colloidal oatmeal (Aveeno) to bath water. ~ Call EMS 911 if develop signs and symptoms of anaphylaxis within minutes to several hours of exposure: severe difficulty breathing; rapid, weak or irregular pulse; pruritus, urticaria, swelling of face, lips, tongue, or throat causing tightness or difficulty swallowing; abdominal cramping, nausea, vomiting or diarrhea. ~ For symptom relief, consider nonprescription antihistamines (such as  Allerest, Allegra, Claritin, Zyrtec, Chlor-Trimetron, Benadryl, etc.) as directed on label or by pharmacist. Drowsiness may result, especially in geriatric patients. Non-sedating antihistamines are available without a prescription.

## 2014-06-01 ENCOUNTER — Ambulatory Visit: Payer: PRIVATE HEALTH INSURANCE | Admitting: Family

## 2014-06-02 ENCOUNTER — Telehealth: Payer: Self-pay

## 2014-06-02 DIAGNOSIS — E785 Hyperlipidemia, unspecified: Secondary | ICD-10-CM

## 2014-06-02 DIAGNOSIS — E119 Type 2 diabetes mellitus without complications: Secondary | ICD-10-CM

## 2014-06-02 NOTE — Telephone Encounter (Signed)
mychart message sent asking pt to come in to ldl and A1C rechecked

## 2014-06-04 IMAGING — CR DG SHOULDER 2+V*L*
3 series · 3 of 3 positions shown · non-contrast
Comparison: None.

CLINICAL DATA: The patient slipped, fell down 13 stairs and has
left shoulder pain

LEFT SHOULDER - 2+ VIEW

[w shoulder ap internal left]
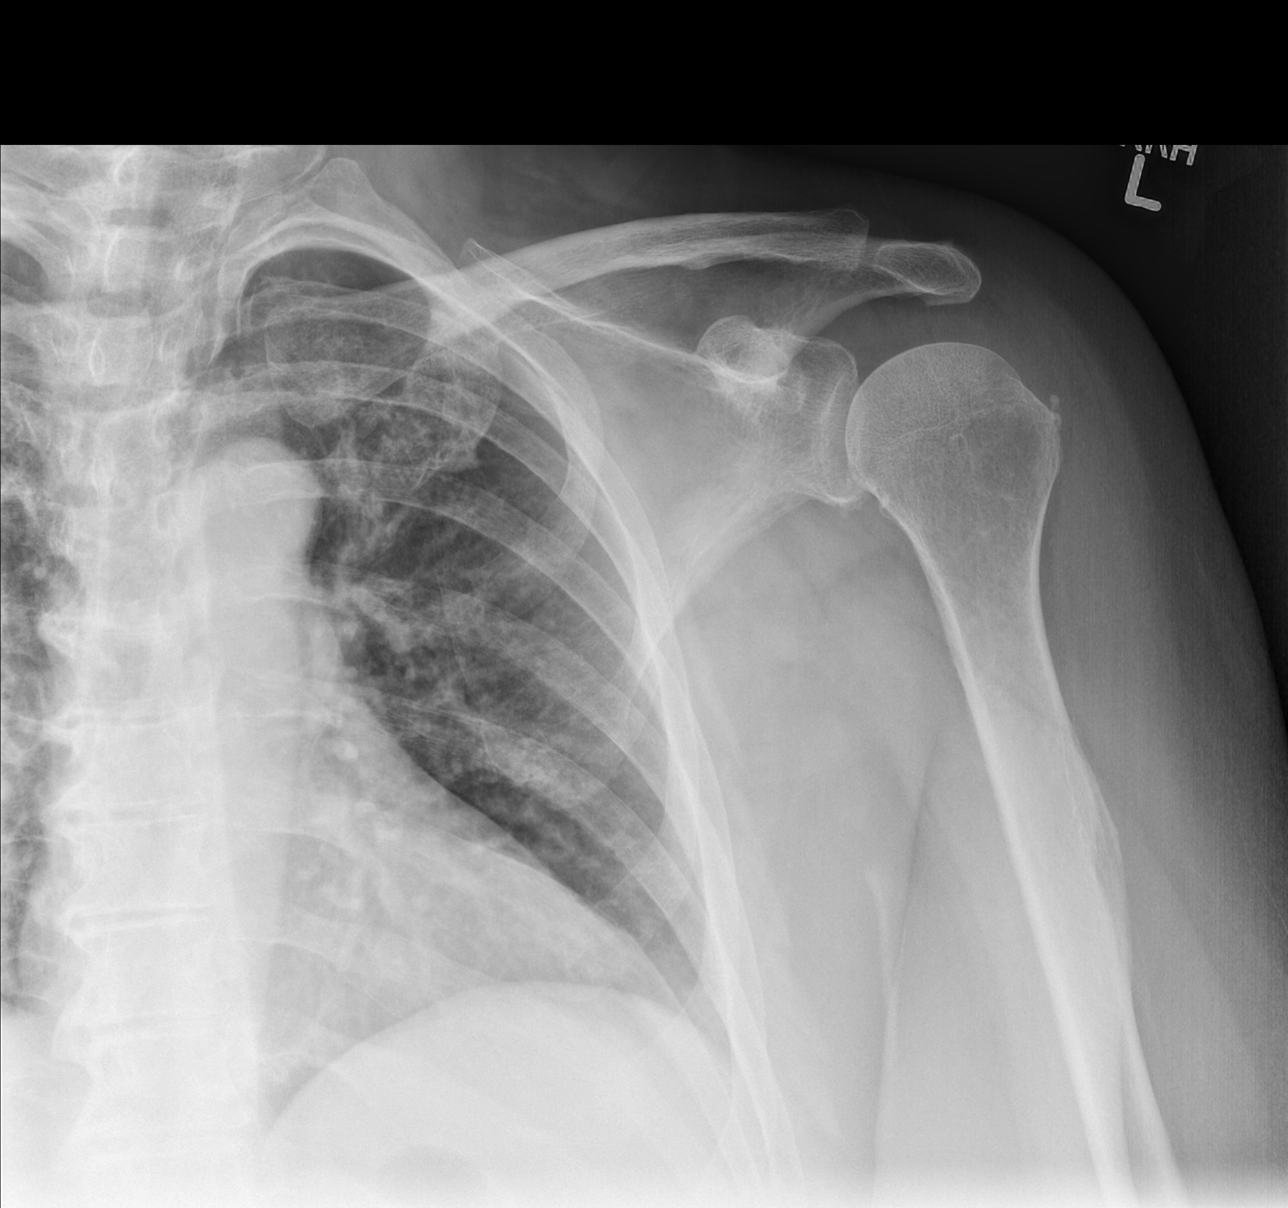

[w shoulder y view left]
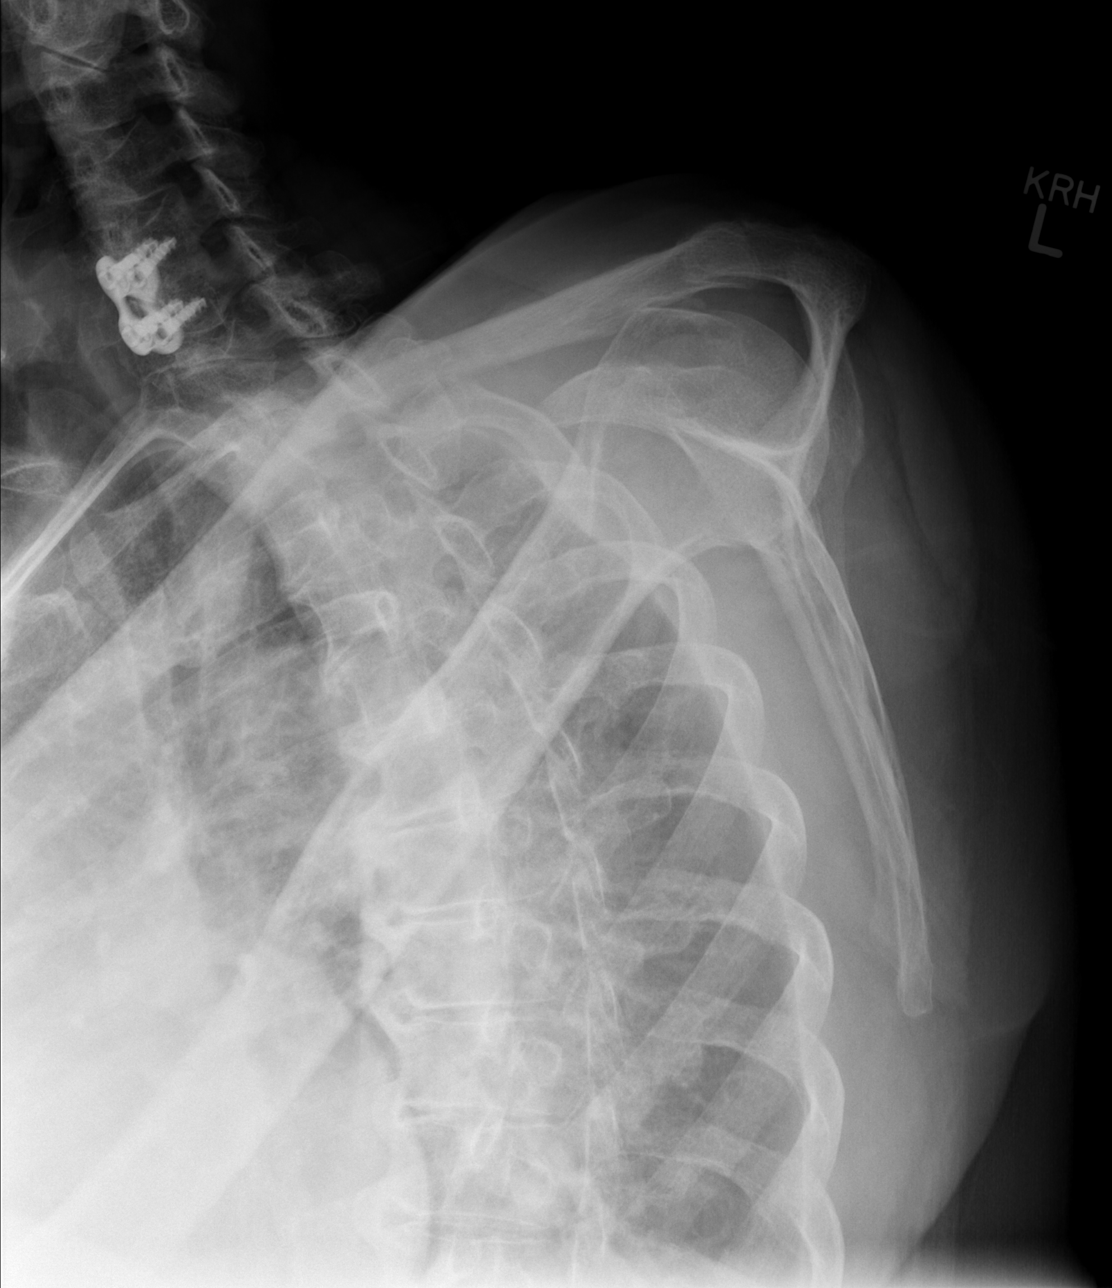

[x shoulder axillary left]
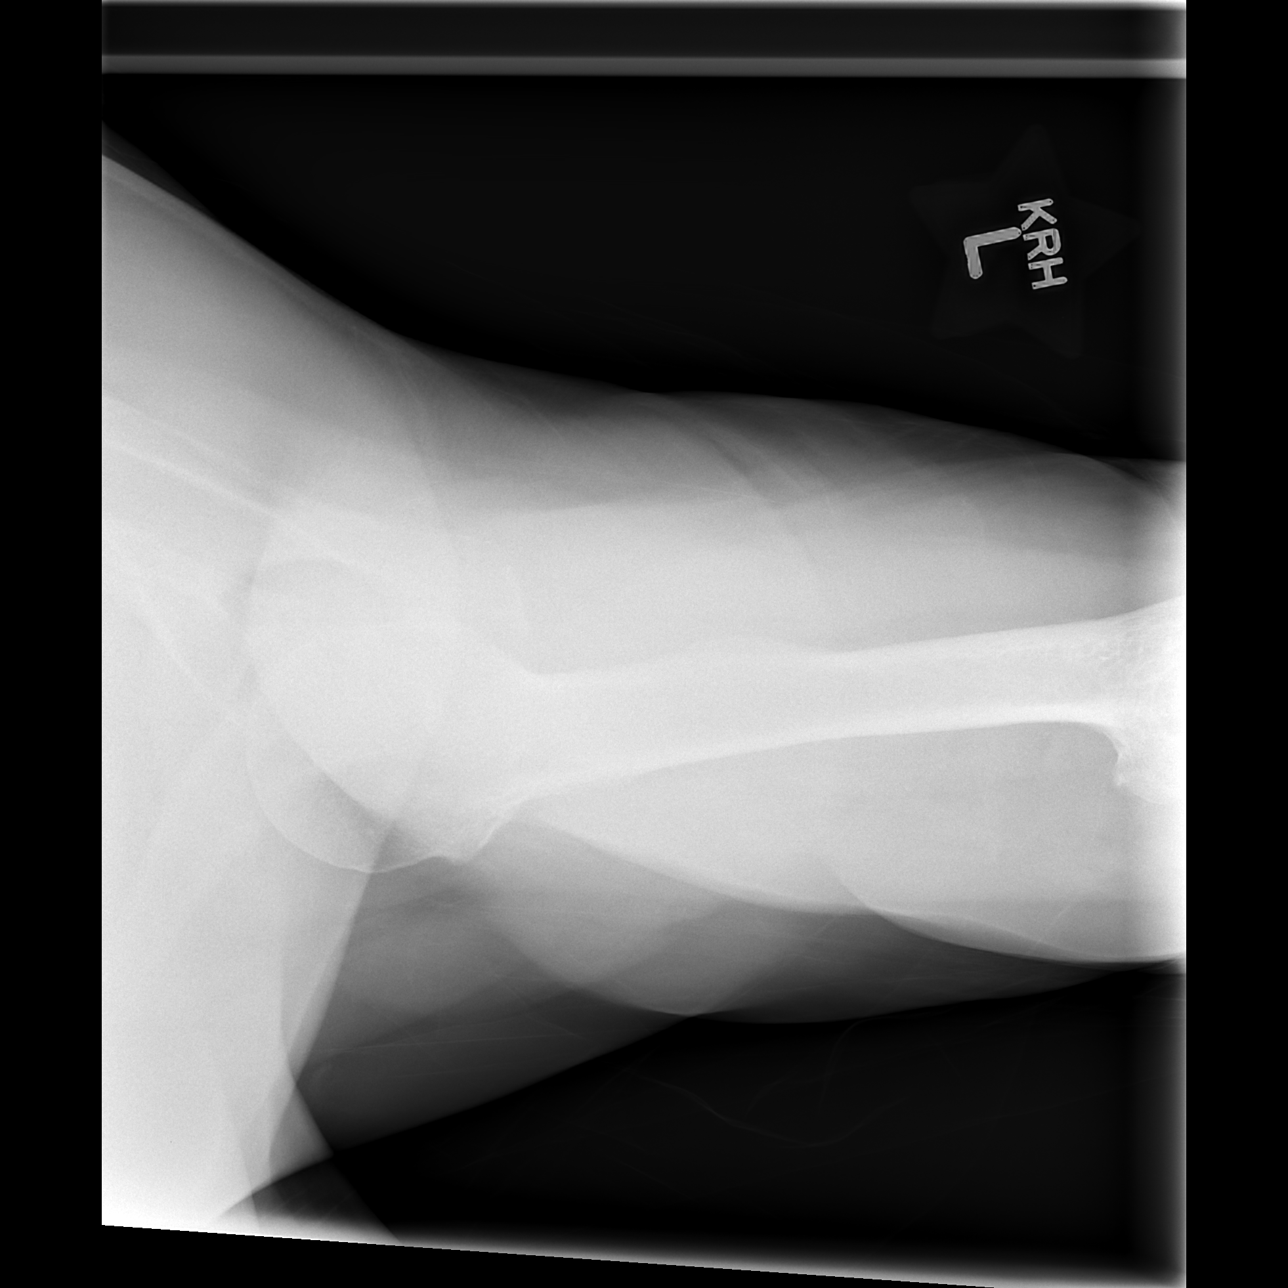

[3 of 3 positions shown; findings below may reference images not displayed]

FINDINGS: There is no acute fracture or dislocation.  The
visualized left ribs and left lung field are normal.
IMPRESSION: No acute fracture or dislocation.

## 2014-06-08 ENCOUNTER — Encounter: Payer: Self-pay | Admitting: Family

## 2014-06-08 ENCOUNTER — Ambulatory Visit (INDEPENDENT_AMBULATORY_CARE_PROVIDER_SITE_OTHER): Payer: PRIVATE HEALTH INSURANCE | Admitting: Family

## 2014-06-08 VITALS — BP 178/96 | HR 90 | Temp 98.1°F | Resp 16 | Ht 65.25 in | Wt 248.0 lb

## 2014-06-08 DIAGNOSIS — F411 Generalized anxiety disorder: Secondary | ICD-10-CM

## 2014-06-08 DIAGNOSIS — F329 Major depressive disorder, single episode, unspecified: Secondary | ICD-10-CM

## 2014-06-08 DIAGNOSIS — R61 Generalized hyperhidrosis: Secondary | ICD-10-CM

## 2014-06-08 DIAGNOSIS — R232 Flushing: Secondary | ICD-10-CM

## 2014-06-08 DIAGNOSIS — L74519 Primary focal hyperhidrosis, unspecified: Secondary | ICD-10-CM

## 2014-06-08 DIAGNOSIS — I10 Essential (primary) hypertension: Secondary | ICD-10-CM

## 2014-06-08 DIAGNOSIS — N951 Menopausal and female climacteric states: Secondary | ICD-10-CM

## 2014-06-08 DIAGNOSIS — E119 Type 2 diabetes mellitus without complications: Secondary | ICD-10-CM

## 2014-06-08 DIAGNOSIS — F3289 Other specified depressive episodes: Secondary | ICD-10-CM

## 2014-06-08 LAB — BASIC METABOLIC PANEL WITH GFR
BUN: 16 mg/dL (ref 6–23)
CALCIUM: 9.3 mg/dL (ref 8.4–10.5)
CHLORIDE: 96 meq/L (ref 96–112)
CO2: 28 meq/L (ref 19–32)
Creat: 0.83 mg/dL (ref 0.50–1.10)
GFR, Est African American: >89 mL/min
GFR, Est Non African American: 79 mL/min
GLUCOSE: 164 mg/dL — AB (ref 70–99)
POTASSIUM: 4 meq/L (ref 3.5–5.3)
SODIUM: 138 meq/L (ref 135–145)

## 2014-06-08 LAB — HEMOGLOBIN A1C
HEMOGLOBIN A1C: 6.9 % — AB (ref <5.7)
Mean Plasma Glucose: 151 mg/dL — ABNORMAL HIGH (ref <117)

## 2014-06-08 LAB — TSH: TSH: 1.844 u[IU]/mL (ref 0.350–4.500)

## 2014-06-08 NOTE — Progress Notes (Signed)
Pre visit review using our clinic review tool, if applicable. No additional management support is needed unless otherwise documented below in the visit note. 

## 2014-06-08 NOTE — Progress Notes (Signed)
Subjective:    Patient ID: [name], female    DOB: 17-Sep-1956, 58 y.o.   MRN: [id number]  HPI  [name] is a 58 yr old female who presents today for follow up.  DM2-  Current meds include glucophage and januvia. Tries to watch her diet.  She denies hypoglycemia.  Lab Results  Component Value Date   HGBA1C 6.8* 02/09/2014   HGBA1C 7.9* 11/02/2013   HGBA1C 6.8* 03/31/2013   Lab Results  Component Value Date   MICROALBUR 0.57 02/09/2014   LDLCALC 111* 02/09/2014   CREATININE 0.68 02/09/2014    HTN-  Current meds include amlodipine, lasix, and losartan.  Reports feeling anxious this AM. Reports + med compliance, denies CP/SOB or swelling.  BP Readings from Last 3 Encounters:  06/08/14 178/96  03/29/14 126/84  02/09/14 146/80   Anxiety/Depression- maintained on pristiq and xanax. She is following with psychiatry.   Excessive sweating- she tried gabapentin, but reports that symptoms have worsened.  Reports symptoms have been present x 1 year.  She is s/p partial hysterectomy.    Review of Systems See HPI  Past Medical History  Diagnosis Date  . Diverticulosis   . Restless leg syndrome   . IBS (irritable bowel syndrome)   . IC (interstitial cystitis)   . Hypertension   . Diabetes mellitus     diet controlled  . Depression   . Fibromyalgia   . Anxiety   . Osteopenia   . Arthritis     in knees  . Dyslipidemia   . Chronic fatigue syndrome   . Chronic abdominal pain   . Esophageal reflux   . TIA (transient ischemic attack) 08/22/2012  . Obesity   . Sleep apnea     History   Social History  . Marital Status: Divorced    Spouse Name: N/A    Number of Children: 2  . Years of Education: N/A   Occupational History  . disabled    Social History Main Topics  . Smoking status: Former Smoker -- 0.10 packs/day for 1 years    Types: Cigarettes    Quit date: 10/19/2012  . Smokeless tobacco: Never Used     Comment: using electronic cigarettes  . Alcohol  Use: Yes     Comment: 2 drinks per month  . Drug Use: No  . Sexual Activity: Not on file   Other Topics Concern  . Not on file   Social History Narrative   Disabled   Divorced, lives alone - 1 dtr, 1 son    Past Surgical History  Procedure Laterality Date  . Total knee arthroplasty      bilateral  . Tonsillectomy and adenoidectomy    . Breast cyst excision  1978    left  . Laparotomy  1982    cyst removed from right ovary and removal of adhesions  . Abdominal exploration surgery  1983    diverticulum removed  . Appendectomy  1983    with exploration  . Laparoscopic incisional / umbilical / ventral hernia repair  1991, 2001, 2007    x 3  . Vaginal hysterectomy  1992  . Laparotomy  1991  . Scalp surgery  1993, 1998    Removal of tumor  . Nasal sinus surgery  2004  . Rectocele repair  2009  . Cervical fusion  2010  . Bladder hydrodistention      Family History  Problem Relation Age of Onset  . Breast cancer Mother   .  Dementia Mother   . Breast cancer Maternal Grandmother   . Throat cancer Maternal Grandmother   . Esophageal cancer Maternal Grandmother   . Breast cancer Other     maternal great aunts x 5  . Rheum arthritis Other   . Colon cancer Neg Hx   . Diabetes Father   . Dementia Father   . Diabetes Maternal Grandfather     Allergies  Allergen Reactions  . Chlorzoxazone     unknown  . Gabapentin     nausea  . Gemfibrozil     nausea  . Metronidazole     nausea  . Morphine And Related     vomiting  . Nitrofurantoin     nausea  . Olmesartan Medoxomil-Hctz     nausea  . Ondansetron Nausea And Vomiting  . Pentosan Polysulfate Sodium     unknown  . Propoxyphene N-Acetaminophen     nausea  . Ramipril     REACTION: cough  . Sulfonamide Derivatives     nausea    Current Outpatient Prescriptions on File Prior to Visit  Medication Sig Dispense Refill  . ALPRAZolam (XANAX) 1 MG tablet Take 1 mg by mouth 3 (three) times daily as needed. For  anxiety      . amLODipine (NORVASC) 10 MG tablet TAKE 1 TABLET (10 MG TOTAL) BY MOUTH DAILY.  30 tablet  1  . aspirin EC 325 MG tablet Take 325 mg by mouth daily.      Marland Kitchen desvenlafaxine (PRISTIQ) 50 MG 24 hr tablet Take 150 mg by mouth daily.      Marland Kitchen dicyclomine (BENTYL) 20 MG tablet Take 1 tablet (20 mg total) by mouth 2 (two) times daily as needed. Irration bowels  60 tablet  11  . fentaNYL (DURAGESIC) 100 MCG/HR Place 1 patch onto the skin every 2 days      . glucose blood (ONE TOUCH ULTRA TEST) test strip Use as instructed. Dx: 250.00  100 each  6  . lidocaine (LIDODERM) 5 % Place 1 patch onto the skin at bedtime.      Marland Kitchen losartan (COZAAR) 50 MG tablet TAKE 1 TABLET (50 MG TOTAL) BY MOUTH AT BEDTIME.  30 tablet  3  . metFORMIN (GLUCOPHAGE-XR) 500 MG 24 hr tablet TAKE 4 TABLETS (2,000 MG TOTAL) BY MOUTH DAILY WITH BREAKFAST.  120 tablet  4  . mirabegron ER (MYRBETRIQ) 50 MG TB24 Take 50 mg by mouth every evening.      Marland Kitchen NEXIUM 40 MG capsule TAKE 1 CAPSULE (40 MG TOTAL) BY MOUTH DAILY BEFORE BREAKFAST.  30 capsule  2  . nystatin (MYCOSTATIN) powder Apply topically 2 (two) times daily.  15 g  2  . oxyCODONE (OXY IR/ROXICODONE) 5 MG immediate release tablet Take 5 mg by mouth 3 (three) times daily.      Marland Kitchen POTASSIUM GLUCONATE PO Take 1 tablet by mouth as needed.      . promethazine (PHENERGAN) 25 MG tablet Take 25 mg by mouth at bedtime. Patient takes every day per patient      . rOPINIRole (REQUIP) 4 MG tablet TAKE 1 TABLET (4 MG TOTAL) BY MOUTH AT BEDTIME.  30 tablet  3  . sitaGLIPtin (JANUVIA) 100 MG tablet Take 1 tablet (100 mg total) by mouth daily.  30 tablet  2  . tiZANidine (ZANAFLEX) 4 MG tablet Take 4 mg by mouth at bedtime.      . traZODone (DESYREL) 100 MG tablet Take 1 - 2 tablets at  bedtime as needed.      Marland Kitchen UNABLE TO FIND C-Pap machine , use nightly       No current facility-administered medications on file prior to visit.    BP 178/96  Pulse 90  Temp(Src) 98.1 F (36.7 C)  (Oral)  Resp 16  Ht 5' 5.25" (1.657 m)  Wt 248 lb 0.6 oz (112.51 kg)  BMI 40.98 kg/m2  SpO2 99%       Objective:   Physical Exam  Constitutional: She is oriented to person, place, and time. She appears well-developed and well-nourished. No distress.  HENT:  Head: Normocephalic and atraumatic.  Cardiovascular: Normal rate and regular rhythm.   No murmur heard. Pulmonary/Chest: Breath sounds normal. No respiratory distress. She has no wheezes. She has no rales. She exhibits no tenderness.  Musculoskeletal: She exhibits no edema.  Neurological: She is alert and oriented to person, place, and time.  Psychiatric: She has a normal mood and affect. Her behavior is normal. Judgment and thought content normal.          Assessment & Plan:

## 2014-06-08 NOTE — Patient Instructions (Signed)
Check blood pressure tomorrow AM and contact me with your reading. Complete lab work prior to leaving. Follow up in 3 months, sooner if problems or concerns.

## 2014-06-09 ENCOUNTER — Telehealth: Payer: Self-pay | Admitting: Family

## 2014-06-09 ENCOUNTER — Encounter: Payer: Self-pay | Admitting: Family

## 2014-06-09 DIAGNOSIS — I10 Essential (primary) hypertension: Secondary | ICD-10-CM

## 2014-06-09 MED ORDER — LOSARTAN POTASSIUM 100 MG PO TABS: 100.0000 mg | ORAL_TABLET | Freq: Every day | ORAL | Status: DC

## 2014-06-09 NOTE — Telephone Encounter (Signed)
Patient states that she was told to call in today with blood pressure reading, 142/100

## 2014-06-09 NOTE — Telephone Encounter (Signed)
Left message to return my call.  

## 2014-06-09 NOTE — Telephone Encounter (Signed)
Increase losartan to 100mg  once daily.  Follow up in 2 weeks for nurse visit BP check and bmet (htn)

## 2014-06-11 DIAGNOSIS — R61 Generalized hyperhidrosis: Secondary | ICD-10-CM | POA: Insufficient documentation

## 2014-06-11 NOTE — Assessment & Plan Note (Signed)
Stable, managed by psych 

## 2014-06-11 NOTE — Assessment & Plan Note (Signed)
Lab Results  Component Value Date   HGBA1C 6.9* 06/08/2014   A1C remains at goal. Continue current meds.

## 2014-06-11 NOTE — Assessment & Plan Note (Signed)
Uncontrolled. Much higher today than her usual. I have asked pt to recheck BP at home tomorrow and contact me with her reading.

## 2014-06-11 NOTE — Assessment & Plan Note (Signed)
TSH normal, suspect this is side effect from pritiq, advised pt to discuss with her psychiatrist.

## 2014-06-14 NOTE — Telephone Encounter (Signed)
Left message for pt to return my call.

## 2014-06-14 NOTE — Telephone Encounter (Signed)
Pt left messaeg returning my call. Attempted to reach pt and left message to let us know if we can leave her a detailed message when we call her back.

## 2014-06-14 NOTE — Telephone Encounter (Signed)
Notified pt and she voices understanding. Nurse visit scheduled for 06/28/14 at 1:30pm, lab order entered.

## 2014-06-15 ENCOUNTER — Other Ambulatory Visit: Payer: Self-pay | Admitting: Family

## 2014-06-24 ENCOUNTER — Other Ambulatory Visit: Payer: Self-pay | Admitting: Family

## 2014-06-26 ENCOUNTER — Other Ambulatory Visit: Payer: Self-pay | Admitting: Family

## 2014-06-28 ENCOUNTER — Ambulatory Visit (INDEPENDENT_AMBULATORY_CARE_PROVIDER_SITE_OTHER): Payer: PRIVATE HEALTH INSURANCE | Admitting: Family

## 2014-06-28 VITALS — BP 126/80 | HR 76 | Wt 250.0 lb

## 2014-06-28 DIAGNOSIS — I10 Essential (primary) hypertension: Secondary | ICD-10-CM

## 2014-06-28 DIAGNOSIS — N3010 Interstitial cystitis (chronic) without hematuria: Secondary | ICD-10-CM

## 2014-06-28 DIAGNOSIS — R252 Cramp and spasm: Secondary | ICD-10-CM

## 2014-06-28 NOTE — Progress Notes (Signed)
Pt notes cramps in her left upper thigh and toes on left foot x 3 weeks. Pt is having bmet checked and per verbal from Provider ok to add magnesium. Pt also requests b12 level due to her interstitial cystitis. Verbal ok from Provider to add B12.

## 2014-06-29 LAB — BASIC METABOLIC PANEL
BUN: 16 mg/dL (ref 6–23)
CHLORIDE: 99 meq/L (ref 96–112)
CO2: 31 mEq/L (ref 19–32)
Calcium: 9.4 mg/dL (ref 8.4–10.5)
Creat: 0.74 mg/dL (ref 0.50–1.10)
Glucose, Bld: 88 mg/dL (ref 70–99)
POTASSIUM: 5.1 meq/L (ref 3.5–5.3)
SODIUM: 135 meq/L (ref 135–145)

## 2014-07-01 DIAGNOSIS — R252 Cramp and spasm: Secondary | ICD-10-CM | POA: Insufficient documentation

## 2014-07-01 NOTE — Assessment & Plan Note (Signed)
BP Readings from Last 3 Encounters:  06/28/14 126/80  06/08/14 178/96  03/29/14 126/84   BP is improved. Continue current meds. Obtain bmet.

## 2014-07-01 NOTE — Assessment & Plan Note (Signed)
Obtain bmet and mag level.

## 2014-07-22 ENCOUNTER — Other Ambulatory Visit: Payer: Self-pay | Admitting: Family

## 2014-07-31 ENCOUNTER — Emergency Department (HOSPITAL_COMMUNITY): Payer: PRIVATE HEALTH INSURANCE

## 2014-07-31 ENCOUNTER — Encounter (HOSPITAL_COMMUNITY): Payer: Self-pay | Admitting: Emergency Medicine

## 2014-07-31 ENCOUNTER — Inpatient Hospital Stay (HOSPITAL_COMMUNITY)
Admission: EM | Admit: 2014-07-31 | Discharge: 2014-08-03 | DRG: 556 | Disposition: A | Discharge status: Home Health | Payer: PRIVATE HEALTH INSURANCE | Attending: Internal Medicine | Admitting: Internal Medicine

## 2014-07-31 DIAGNOSIS — E119 Type 2 diabetes mellitus without complications: Secondary | ICD-10-CM

## 2014-07-31 DIAGNOSIS — R232 Flushing: Secondary | ICD-10-CM

## 2014-07-31 DIAGNOSIS — F329 Major depressive disorder, single episode, unspecified: Secondary | ICD-10-CM | POA: Diagnosis present

## 2014-07-31 DIAGNOSIS — Z6841 Body Mass Index (BMI) 40.0 and over, adult: Secondary | ICD-10-CM | POA: Diagnosis not present

## 2014-07-31 DIAGNOSIS — K2100 Gastro-esophageal reflux disease with esophagitis, without bleeding: Secondary | ICD-10-CM

## 2014-07-31 DIAGNOSIS — G2581 Restless legs syndrome: Secondary | ICD-10-CM

## 2014-07-31 DIAGNOSIS — K589 Irritable bowel syndrome without diarrhea: Secondary | ICD-10-CM | POA: Diagnosis present

## 2014-07-31 DIAGNOSIS — Z23 Encounter for immunization: Secondary | ICD-10-CM | POA: Diagnosis not present

## 2014-07-31 DIAGNOSIS — Z8673 Personal history of transient ischemic attack (TIA), and cerebral infarction without residual deficits: Secondary | ICD-10-CM

## 2014-07-31 DIAGNOSIS — Z96659 Presence of unspecified artificial knee joint: Secondary | ICD-10-CM | POA: Diagnosis not present

## 2014-07-31 DIAGNOSIS — M899 Disorder of bone, unspecified: Secondary | ICD-10-CM

## 2014-07-31 DIAGNOSIS — R61 Generalized hyperhidrosis: Secondary | ICD-10-CM

## 2014-07-31 DIAGNOSIS — E669 Obesity, unspecified: Secondary | ICD-10-CM | POA: Diagnosis present

## 2014-07-31 DIAGNOSIS — R1012 Left upper quadrant pain: Secondary | ICD-10-CM | POA: Diagnosis not present

## 2014-07-31 DIAGNOSIS — R109 Unspecified abdominal pain: Secondary | ICD-10-CM | POA: Diagnosis present

## 2014-07-31 DIAGNOSIS — R252 Cramp and spasm: Secondary | ICD-10-CM

## 2014-07-31 DIAGNOSIS — M7981 Nontraumatic hematoma of soft tissue: Principal | ICD-10-CM | POA: Diagnosis present

## 2014-07-31 DIAGNOSIS — R1032 Left lower quadrant pain: Secondary | ICD-10-CM

## 2014-07-31 DIAGNOSIS — F3289 Other specified depressive episodes: Secondary | ICD-10-CM | POA: Diagnosis present

## 2014-07-31 DIAGNOSIS — E559 Vitamin D deficiency, unspecified: Secondary | ICD-10-CM

## 2014-07-31 DIAGNOSIS — I10 Essential (primary) hypertension: Secondary | ICD-10-CM | POA: Diagnosis present

## 2014-07-31 DIAGNOSIS — Z87891 Personal history of nicotine dependence: Secondary | ICD-10-CM

## 2014-07-31 DIAGNOSIS — J309 Allergic rhinitis, unspecified: Secondary | ICD-10-CM

## 2014-07-31 DIAGNOSIS — R509 Fever, unspecified: Secondary | ICD-10-CM

## 2014-07-31 DIAGNOSIS — Z981 Arthrodesis status: Secondary | ICD-10-CM

## 2014-07-31 DIAGNOSIS — K219 Gastro-esophageal reflux disease without esophagitis: Secondary | ICD-10-CM | POA: Diagnosis present

## 2014-07-31 DIAGNOSIS — K30 Functional dyspepsia: Secondary | ICD-10-CM

## 2014-07-31 DIAGNOSIS — Z79899 Other long term (current) drug therapy: Secondary | ICD-10-CM

## 2014-07-31 DIAGNOSIS — G4733 Obstructive sleep apnea (adult) (pediatric): Secondary | ICD-10-CM | POA: Diagnosis present

## 2014-07-31 DIAGNOSIS — L304 Erythema intertrigo: Secondary | ICD-10-CM

## 2014-07-31 DIAGNOSIS — N3010 Interstitial cystitis (chronic) without hematuria: Secondary | ICD-10-CM

## 2014-07-31 DIAGNOSIS — F411 Generalized anxiety disorder: Secondary | ICD-10-CM

## 2014-07-31 DIAGNOSIS — Z7982 Long term (current) use of aspirin: Secondary | ICD-10-CM | POA: Diagnosis not present

## 2014-07-31 DIAGNOSIS — M949 Disorder of cartilage, unspecified: Secondary | ICD-10-CM

## 2014-07-31 DIAGNOSIS — G894 Chronic pain syndrome: Secondary | ICD-10-CM

## 2014-07-31 DIAGNOSIS — G459 Transient cerebral ischemic attack, unspecified: Secondary | ICD-10-CM | POA: Diagnosis present

## 2014-07-31 DIAGNOSIS — E785 Hyperlipidemia, unspecified: Secondary | ICD-10-CM

## 2014-07-31 DIAGNOSIS — M129 Arthropathy, unspecified: Secondary | ICD-10-CM

## 2014-07-31 DIAGNOSIS — S3011XA Contusion of abdominal wall, initial encounter: Secondary | ICD-10-CM | POA: Diagnosis present

## 2014-07-31 DIAGNOSIS — S301XXA Contusion of abdominal wall, initial encounter: Secondary | ICD-10-CM | POA: Diagnosis present

## 2014-07-31 DIAGNOSIS — Z87442 Personal history of urinary calculi: Secondary | ICD-10-CM

## 2014-07-31 DIAGNOSIS — IMO0001 Reserved for inherently not codable concepts without codable children: Secondary | ICD-10-CM

## 2014-07-31 LAB — COMPREHENSIVE METABOLIC PANEL
ALT: 20 U/L (ref 0–35)
AST: 18 U/L (ref 0–37)
Albumin: 4 g/dL (ref 3.5–5.2)
Alkaline Phosphatase: 78 U/L (ref 39–117)
Anion gap: 14 (ref 5–15)
BUN: 16 mg/dL (ref 6–23)
CALCIUM: 9.6 mg/dL (ref 8.4–10.5)
CHLORIDE: 98 meq/L (ref 96–112)
CO2: 25 meq/L (ref 19–32)
Creatinine, Ser: 0.69 mg/dL (ref 0.50–1.10)
GFR calc Af Amer: >90 mL/min (ref >90)
GLUCOSE: 153 mg/dL — AB (ref 70–99)
Potassium: 3.7 mEq/L (ref 3.7–5.3)
SODIUM: 137 meq/L (ref 137–147)
Total Bilirubin: 0.4 mg/dL (ref 0.3–1.2)
Total Protein: 7.1 g/dL (ref 6.0–8.3)

## 2014-07-31 LAB — CBC
HCT: 38.8 % (ref 36.0–46.0)
HEMATOCRIT: 37.2 % (ref 36.0–46.0)
HEMOGLOBIN: 13.1 g/dL (ref 12.0–15.0)
HEMOGLOBIN: 12.5 g/dL (ref 12.0–15.0)
MCH: 27.8 pg (ref 26.0–34.0)
MCH: 27.8 pg (ref 26.0–34.0)
MCHC: 33.8 g/dL (ref 30.0–36.0)
MCHC: 33.6 g/dL (ref 30.0–36.0)
MCV: 82.4 fL (ref 78.0–100.0)
MCV: 82.9 fL (ref 78.0–100.0)
Platelets: 211 10*3/uL (ref 150–400)
Platelets: 193 10*3/uL (ref 150–400)
RBC: 4.71 MIL/uL (ref 3.87–5.11)
RBC: 4.49 MIL/uL (ref 3.87–5.11)
RDW: 12.9 % (ref 11.5–15.5)
RDW: 13 % (ref 11.5–15.5)
WBC: 8 10*3/uL (ref 4.0–10.5)
WBC: 6.9 10*3/uL (ref 4.0–10.5)

## 2014-07-31 LAB — I-STAT CHEM 8, ED
BUN: 16 mg/dL (ref 6–23)
CREATININE: 0.7 mg/dL (ref 0.50–1.10)
Calcium, Ion: 1.09 mmol/L — ABNORMAL LOW (ref 1.12–1.23)
Chloride: 101 mEq/L (ref 96–112)
Glucose, Bld: 158 mg/dL — ABNORMAL HIGH (ref 70–99)
HCT: 42 % (ref 36.0–46.0)
Hemoglobin: 14.3 g/dL (ref 12.0–15.0)
POTASSIUM: 3.4 meq/L — AB (ref 3.7–5.3)
SODIUM: 136 meq/L — AB (ref 137–147)
TCO2: 24 mmol/L (ref 0–100)

## 2014-07-31 LAB — GLUCOSE, CAPILLARY: Glucose-Capillary: 167 mg/dL — ABNORMAL HIGH (ref 70–99)

## 2014-07-31 LAB — TYPE AND SCREEN
ABO/RH(D): A POS
Antibody Screen: NEGATIVE

## 2014-07-31 LAB — LIPASE, BLOOD: Lipase: 13 U/L (ref 11–59)

## 2014-07-31 LAB — I-STAT CG4 LACTIC ACID, ED: Lactic Acid, Venous: 2.78 mmol/L — ABNORMAL HIGH (ref 0.5–2.2)

## 2014-07-31 MED ORDER — HYDROMORPHONE HCL PF 1 MG/ML IJ SOLN
1.0000 mg | Freq: Once | INTRAMUSCULAR | Status: AC
  Administered 2014-07-31: 1 mg via INTRAVENOUS
  Filled 2014-07-31: qty 1

## 2014-07-31 MED ORDER — IOHEXOL 350 MG/ML SOLN
100.0000 mL | Freq: Once | INTRAVENOUS | Status: AC | PRN
  Administered 2014-07-31: 100 mL via INTRAVENOUS

## 2014-07-31 MED ORDER — INFLUENZA VAC SPLIT QUAD 0.5 ML IM SUSY
0.5000 mL | PREFILLED_SYRINGE | INTRAMUSCULAR | Status: AC
  Administered 2014-08-02: 0.5 mL via INTRAMUSCULAR
  Filled 2014-07-31: qty 0.5

## 2014-07-31 MED ORDER — ALPRAZOLAM 0.5 MG PO TABS
1.0000 mg | ORAL_TABLET | Freq: Three times a day (TID) | ORAL | Status: DC | PRN
  Administered 2014-07-31 – 2014-08-02 (×3): 1 mg via ORAL
  Filled 2014-07-31 (×3): qty 2

## 2014-07-31 MED ORDER — POTASSIUM CHLORIDE 10 MEQ/100ML IV SOLN
10.0000 meq | INTRAVENOUS | Status: AC
  Administered 2014-07-31 – 2014-08-01 (×4): 10 meq via INTRAVENOUS
  Filled 2014-07-31 (×4): qty 100

## 2014-07-31 MED ORDER — FLUTICASONE PROPIONATE 50 MCG/ACT NA SUSP: 2.0000 | Freq: Every day | NASAL | Status: DC | PRN

## 2014-07-31 MED ORDER — PROMETHAZINE HCL 25 MG/ML IJ SOLN
25.0000 mg | Freq: Once | INTRAMUSCULAR | Status: DC
  Administered 2014-07-31: 25 mg via INTRAVENOUS
  Filled 2014-07-31: qty 1

## 2014-07-31 MED ORDER — ROPINIROLE HCL 1 MG PO TABS
1.0000 mg | ORAL_TABLET | Freq: Every day | ORAL | Status: DC
  Administered 2014-07-31 – 2014-08-02 (×3): 1 mg via ORAL
  Filled 2014-07-31 (×4): qty 1

## 2014-07-31 MED ORDER — MIRABEGRON ER 50 MG PO TB24
50.0000 mg | ORAL_TABLET | Freq: Every evening | ORAL | Status: DC
  Administered 2014-07-31 – 2014-08-02 (×3): 50 mg via ORAL
  Filled 2014-07-31 (×4): qty 1

## 2014-07-31 MED ORDER — TIZANIDINE HCL 4 MG PO TABS
4.0000 mg | ORAL_TABLET | Freq: Every day | ORAL | Status: DC
  Administered 2014-07-31 – 2014-08-02 (×3): 4 mg via ORAL
  Filled 2014-07-31 (×5): qty 1

## 2014-07-31 MED ORDER — SODIUM CHLORIDE 0.9 % IV SOLN
INTRAVENOUS | Status: DC
  Administered 2014-07-31: 21:00:00 via INTRAVENOUS

## 2014-07-31 MED ORDER — HEPARIN SODIUM (PORCINE) 5000 UNIT/ML IJ SOLN
5000.0000 [IU] | Freq: Three times a day (TID) | INTRAMUSCULAR | Status: DC
  Administered 2014-07-31: 5000 [IU] via SUBCUTANEOUS

## 2014-07-31 MED ORDER — INSULIN ASPART 100 UNIT/ML ~~LOC~~ SOLN
0.0000 [IU] | Freq: Three times a day (TID) | SUBCUTANEOUS | Status: DC
  Administered 2014-08-01 (×2): 1 [IU] via SUBCUTANEOUS
  Administered 2014-08-01 – 2014-08-02 (×2): 2 [IU] via SUBCUTANEOUS
  Administered 2014-08-02: 08:00:00 via SUBCUTANEOUS
  Administered 2014-08-02: 2 [IU] via SUBCUTANEOUS
  Administered 2014-08-03: 1 [IU] via SUBCUTANEOUS
  Administered 2014-08-03: 2 [IU] via SUBCUTANEOUS

## 2014-07-31 MED ORDER — CETYLPYRIDINIUM CHLORIDE 0.05 % MT LIQD
7.0000 mL | Freq: Two times a day (BID) | OROMUCOSAL | Status: DC
  Administered 2014-08-01 – 2014-08-03 (×5): 7 mL via OROMUCOSAL

## 2014-07-31 MED ORDER — AMLODIPINE BESYLATE 10 MG PO TABS
10.0000 mg | ORAL_TABLET | Freq: Every day | ORAL | Status: DC
  Administered 2014-07-31 – 2014-08-03 (×4): 10 mg via ORAL
  Filled 2014-07-31 (×4): qty 1

## 2014-07-31 MED ORDER — SODIUM CHLORIDE 0.9 % IV BOLUS (SEPSIS)
1000.0000 mL | Freq: Once | INTRAVENOUS | Status: AC
  Administered 2014-07-31: 1000 mL via INTRAVENOUS

## 2014-07-31 MED ORDER — LOSARTAN POTASSIUM 50 MG PO TABS
100.0000 mg | ORAL_TABLET | Freq: Every day | ORAL | Status: DC
  Administered 2014-07-31 – 2014-08-03 (×4): 100 mg via ORAL
  Filled 2014-07-31 (×4): qty 2

## 2014-07-31 MED ORDER — FENTANYL 50 MCG/HR TD PT72
50.0000 ug | MEDICATED_PATCH | TRANSDERMAL | Status: DC
  Administered 2014-07-31: 50 ug via TRANSDERMAL
  Filled 2014-07-31: qty 1

## 2014-07-31 MED ORDER — PROMETHAZINE HCL 25 MG/ML IJ SOLN: 25.0000 mg | Freq: Four times a day (QID) | INTRAMUSCULAR | Status: DC | PRN

## 2014-07-31 MED ORDER — CHLORHEXIDINE GLUCONATE 0.12 % MT SOLN
15.0000 mL | Freq: Two times a day (BID) | OROMUCOSAL | Status: DC
  Administered 2014-08-01 – 2014-08-03 (×5): 15 mL via OROMUCOSAL
  Filled 2014-07-31 (×7): qty 15

## 2014-07-31 MED ORDER — PANTOPRAZOLE SODIUM 40 MG PO TBEC
40.0000 mg | DELAYED_RELEASE_TABLET | Freq: Every day | ORAL | Status: DC
  Administered 2014-07-31 – 2014-08-03 (×4): 40 mg via ORAL
  Filled 2014-07-31 (×5): qty 1

## 2014-07-31 MED ORDER — LIDOCAINE 5 % EX PTCH
1.0000 | MEDICATED_PATCH | Freq: Every evening | CUTANEOUS | Status: DC | PRN
  Filled 2014-07-31: qty 1

## 2014-07-31 MED ORDER — PROMETHAZINE HCL 25 MG/ML IJ SOLN
25.0000 mg | Freq: Once | INTRAMUSCULAR | Status: AC
  Administered 2014-07-31: 25 mg via INTRAVENOUS
  Filled 2014-07-31: qty 1

## 2014-07-31 MED ORDER — TRAZODONE HCL 100 MG PO TABS
100.0000 mg | ORAL_TABLET | Freq: Every evening | ORAL | Status: DC | PRN
  Administered 2014-07-31 – 2014-08-02 (×3): 100 mg via ORAL
  Filled 2014-07-31 (×4): qty 1

## 2014-07-31 MED ORDER — ASPIRIN EC 325 MG PO TBEC
325.0000 mg | DELAYED_RELEASE_TABLET | Freq: Every day | ORAL | Status: DC
  Filled 2014-07-31 (×2): qty 1

## 2014-07-31 MED ORDER — TOPIRAMATE 25 MG PO TABS
50.0000 mg | ORAL_TABLET | Freq: Two times a day (BID) | ORAL | Status: DC
  Administered 2014-08-01 – 2014-08-03 (×4): 50 mg via ORAL
  Filled 2014-07-31 (×7): qty 2

## 2014-07-31 MED ORDER — HYDROMORPHONE HCL PF 1 MG/ML IJ SOLN
1.0000 mg | INTRAMUSCULAR | Status: DC | PRN
  Administered 2014-08-01 – 2014-08-02 (×6): 1 mg via INTRAVENOUS
  Filled 2014-07-31 (×6): qty 1

## 2014-07-31 MED ORDER — DESVENLAFAXINE SUCCINATE ER 50 MG PO TB24
150.0000 mg | ORAL_TABLET | Freq: Every day | ORAL | Status: DC
  Administered 2014-08-01 – 2014-08-03 (×3): 150 mg via ORAL
  Filled 2014-07-31 (×4): qty 1

## 2014-07-31 MED ORDER — DICYCLOMINE HCL 20 MG PO TABS
20.0000 mg | ORAL_TABLET | Freq: Two times a day (BID) | ORAL | Status: DC | PRN
  Administered 2014-08-01 (×2): 20 mg via ORAL
  Filled 2014-07-31 (×3): qty 1

## 2014-07-31 NOTE — Progress Notes (Signed)
Admitting Diagnosis: spontaneous rectus hematoma  Pertinent Hx: Diverticulosis, Restless leg syndrome, IBS (irritable bowel syndrome), IC (interstitial cystitis), Hypertension, Diabetes mellitus, Depression, Fibromyalgia, Anxiety, Osteopenia, Arthritis, Dyslipidemia, Chronic fatigue syndrome, Chronic abdominal pain, Esophageal reflux, TIA (transient ischemic attack), Obesity, Sleep apnea  Living Situation: Home alone  Diet Tolerance: NPO  Foley: None  DVT: Heparin  Mobility: bed rest  Tele: none  Skin: none  Abnormal Labs:  Issues that need to be addressed:  Consults, Needs or Recommendation:  Pending Procedures:   Bowel Movement:9/12  IV/ Drips/Abx: Rt A/C 9/12  Patient Disposition:

## 2014-07-31 NOTE — ED Provider Notes (Signed)
CSN: [id number]     Arrival date & time 07/31/14  1615 History   First MD Initiated Contact with Patient 07/31/14 1633     Chief Complaint  Patient presents with  . Abdominal Pain  . Nausea     (Consider location/radiation/quality/duration/timing/severity/associated sxs/prior Treatment) Patient is a 58 y.o. female presenting with abdominal pain. The history is provided by the patient.  Abdominal Pain Pain location:  Epigastric and LUQ Pain quality: aching and sharp   Pain quality: not squeezing and not stabbing   Pain radiates to:  Back Pain severity:  Severe Onset quality:  Sudden Timing:  Constant Progression:  Worsening Chronicity:  New Context comment:  Has not had her fentanyl patch, pain began while driving Relieved by:  Nothing Worsened by:  Nothing tried Associated symptoms: nausea and vomiting   Associated symptoms: no chest pain, no chills, no cough, no fever and no shortness of breath     Past Medical History  Diagnosis Date  . Diverticulosis   . Restless leg syndrome   . IBS (irritable bowel syndrome)   . IC (interstitial cystitis)   . Hypertension   . Diabetes mellitus     diet controlled  . Depression   . Fibromyalgia   . Anxiety   . Osteopenia   . Arthritis     in knees  . Dyslipidemia   . Chronic fatigue syndrome   . Chronic abdominal pain   . Esophageal reflux   . TIA (transient ischemic attack) 08/22/2012  . Obesity   . Sleep apnea    Past Surgical History  Procedure Laterality Date  . Total knee arthroplasty      bilateral  . Tonsillectomy and adenoidectomy    . Breast cyst excision  1978    left  . Laparotomy  1982    cyst removed from right ovary and removal of adhesions  . Abdominal exploration surgery  1983    diverticulum removed  . Appendectomy  1983    with exploration  . Laparoscopic incisional / umbilical / ventral hernia repair  1991, 2001, 2007    x 3  . Vaginal hysterectomy  1992  . Laparotomy  1991  . Scalp surgery   1993, 1998    Removal of tumor  . Nasal sinus surgery  2004  . Rectocele repair  2009  . Cervical fusion  2010  . Bladder hydrodistention     Family History  Problem Relation Age of Onset  . Breast cancer Mother   . Dementia Mother   . Breast cancer Maternal Grandmother   . Throat cancer Maternal Grandmother   . Esophageal cancer Maternal Grandmother   . Breast cancer Other     maternal great aunts x 5  . Rheum arthritis Other   . Colon cancer Neg Hx   . Diabetes Father   . Dementia Father   . Diabetes Maternal Grandfather    History  Substance Use Topics  . Smoking status: Former Smoker -- 0.10 packs/day for 1 years    Types: Cigarettes    Quit date: 10/19/2012  . Smokeless tobacco: Never Used     Comment: using electronic cigarettes  . Alcohol Use: Yes     Comment: 2 drinks per month   OB History   Grav Para Term Preterm Abortions TAB SAB Ect Mult Living                 Review of Systems  Constitutional: Negative for fever and chills.  Respiratory:  Negative for cough and shortness of breath.   Cardiovascular: Negative for chest pain and leg swelling.  Gastrointestinal: Positive for nausea, vomiting and abdominal pain.  All other systems reviewed and are negative.     Allergies  Chlorzoxazone; Gabapentin; Gemfibrozil; Metronidazole; Morphine and related; Nitrofurantoin; Olmesartan medoxomil-hctz; Ondansetron; Pentosan polysulfate sodium; Propoxyphene n-acetaminophen; Ramipril; and Sulfonamide derivatives  Home Medications   Prior to Admission medications   Medication Sig Start Date End Date Taking? Authorizing Provider  ALPRAZolam Duanne Moron) 1 MG tablet Take 1 mg by mouth 3 (three) times daily as needed. For anxiety    Historical Provider, MD  amLODipine (NORVASC) 10 MG tablet TAKE 1 TABLET (10 MG TOTAL) BY MOUTH DAILY. 06/15/14   [name], NP  aspirin EC 325 MG tablet Take 325 mg by mouth daily.    Historical Provider, MD  chlorhexidine (PERIDEX) 0.12  % solution X 14 days per dentist 06/04/14   Historical Provider, MD  desvenlafaxine (PRISTIQ) 50 MG 24 hr tablet Take 150 mg by mouth daily.    Historical Provider, MD  dicyclomine (BENTYL) 20 MG tablet Take 1 tablet (20 mg total) by mouth 2 (two) times daily as needed. Irration bowels 12/29/13   Milus Banister, MD  fentaNYL (DURAGESIC) 100 MCG/HR Place 1 patch onto the skin every 2 days    Historical Provider, MD  fluticasone (FLONASE) 50 MCG/ACT nasal spray Place 2 sprays into the nose daily as needed. 02/11/12   Rowe Clack, MD  furosemide (LASIX) 20 MG tablet TAKE ONE TABLET BY MOUTH EVERY 3 DAYs 03/29/14   [name], NP  glucose blood (ONE TOUCH ULTRA TEST) test strip Use as instructed. Dx: 250.00 07/17/13   [name], NP  ibuprofen (ADVIL,MOTRIN) 600 MG tablet Per dentist 06/04/14   Historical Provider, MD  JANUVIA 100 MG tablet TAKE 1 TABLET (100 MG TOTAL) BY MOUTH DAILY. 07/23/14   [name], NP  lidocaine (LIDODERM) 5 % Place 1 patch onto the skin at bedtime as needed.  03/26/13   Historical Provider, MD  losartan (COZAAR) 100 MG tablet Take 1 tablet (100 mg total) by mouth daily. 06/09/14   [name], NP  metFORMIN (GLUCOPHAGE-XR) 500 MG 24 hr tablet TAKE 4 TABLETS (2,000 MG TOTAL) BY MOUTH DAILY WITH BREAKFAST.    [name], NP  mirabegron ER (MYRBETRIQ) 50 MG TB24 Take 50 mg by mouth every evening.    Historical Provider, MD  NEXIUM 40 MG capsule TAKE 1 CAPSULE (40 MG TOTAL) BY MOUTH DAILY BEFORE BREAKFAST. 07/23/14   [name], NP  nystatin (MYCOSTATIN) powder Apply topically 2 (two) times daily. 03/29/14   [name], NP  oxyCODONE (OXY IR/ROXICODONE) 5 MG immediate release tablet Take 5 mg by mouth 3 (three) times daily.    Historical Provider, MD  POTASSIUM GLUCONATE PO Take 1 tablet by mouth as needed.    Historical Provider, MD  promethazine (PHENERGAN) 25 MG tablet Take 25 mg by mouth at bedtime. Patient takes every day per  patient 08/03/11   Rowe Clack, MD  rOPINIRole (REQUIP) 4 MG tablet TAKE 1 TABLET (4 MG TOTAL) BY MOUTH AT BEDTIME.    [name], NP  tiZANidine (ZANAFLEX) 4 MG tablet Take 4 mg by mouth at bedtime. 01/14/13   Historical Provider, MD  topiramate (TOPAMAX) 50 MG tablet Take 50 mg by mouth 2 (two) times daily as needed. 03/31/14   [name], NP  traZODone (DESYREL) 100 MG tablet Take 1 - 2 tablets at bedtime as  needed.    Historical Provider, MD  UNABLE TO FIND C-Pap machine , use nightly    Historical Provider, MD   BP 160/80  Pulse 79  Temp(Src) 97.9 F (36.6 C) (Oral)  Resp 18  SpO2 99% Physical Exam  Nursing note and vitals reviewed. Constitutional: She is oriented to person, place, and time. She appears well-developed and well-nourished. No distress.  HENT:  Head: Normocephalic and atraumatic.  Mouth/Throat: Oropharynx is clear and moist.  Eyes: EOM are normal. Pupils are equal, round, and reactive to light.  Neck: Normal range of motion. Neck supple.  Cardiovascular: Normal rate and regular rhythm.  Exam reveals no friction rub.   No murmur heard. Pulmonary/Chest: Effort normal and breath sounds normal. No respiratory distress. She has no wheezes. She has no rales.  Abdominal: Soft. She exhibits no distension. There is no tenderness. There is no rebound.  Musculoskeletal: Normal range of motion. She exhibits no edema.  Neurological: She is alert and oriented to person, place, and time. No cranial nerve deficit. She exhibits normal muscle tone. Coordination normal.  Skin: No rash noted. She is not diaphoretic.    ED Course  Procedures (including critical care time) Labs Review Labs Reviewed  I-STAT CHEM 8, ED - Abnormal; Notable for the following:    Sodium 136 (*)    Potassium 3.4 (*)    Glucose, Bld 158 (*)    Calcium, Ion 1.09 (*)    All other components within normal limits  I-STAT CG4 LACTIC ACID, ED - Abnormal; Notable for the following:     Lactic Acid, Venous 2.78 (*)    All other components within normal limits  CBC  COMPREHENSIVE METABOLIC PANEL  LIPASE, BLOOD    Imaging Review No results found.   EKG Interpretation   Date/Time:  Saturday July 31 2014 16:25:43 EDT Ventricular Rate:  83 PR Interval:  104 QRS Duration: 94 QT Interval:  404 QTC Calculation: 474 R Axis:   69 Text Interpretation:  Sinus rhythm with short PR Nonspecific ST  abnormality Abnormal ECG Similar to prior Confirmed by Mingo Amber  MD, [place]  ([id number]) on 07/31/2014 4:40:48 PM      MDM   Final diagnoses:  Left upper quadrant pain  Allergic rhinitis, cause unspecified  Type II or unspecified type diabetes mellitus without mention of complication, not stated as uncontrolled  Unspecified essential hypertension  Rectus sheath hematoma, initial encounter    38F with acute onset abdominal pain while driving. Hasn't had her fentanyl patch in a few days. No SOB, CP. No headaches or fever. Began while driving. Having severe upper abdominal pain in LUQ. Vitals with HTN, otherwise ok. LUQ with small mobile mass under the skin, possible hernia. No other abdominal pain. She is exquisitely tender in the LUQ. Pain meds and nausea medication given.  CT shows large left-sided rectum sheath hematoma. Pain not controlled, will admit for pain control and serial H&H. No active extravasation into the hematoma.     Evelina Bucy, MD 08/01/14 0010

## 2014-07-31 NOTE — Progress Notes (Signed)
The patient was transferred from the ED and placed in bed. Patient is alert and oriented with no skin break down. IV in right A/C. Patient oriented to unit by charge nurse.  Charnel Giles J. Conley Canal RN MSN

## 2014-07-31 NOTE — ED Notes (Signed)
Pt arrived by [facility] with c/o left upper abdominal pain. Pt was on her way home from the beach and started having the pain with radiation to back. C/o nausea as well. Denies any strenuous activity.

## 2014-07-31 NOTE — ED Notes (Signed)
NOTIFIED DR. Mingo Amber FOR PATIENTS LAB RESULTS OF CG4+ LACTIC ACID @17 :10 PM ,07/31/2014.

## 2014-07-31 NOTE — H&P (Addendum)
[facility] History and Physical  [name] HQI:[id number] DOB: [birth year] DOA: 07/31/2014  Referring physician: ED physician PCP: [name], NP  Specialists:   Chief Complaint: abdominal pain  HPI: [name] is a 58 y.o. female with past medical history of IBS, hypertension, diabetes type 2, depression, anxiety disorder, TIA, OSA, history of ventral hernia repair, appendectomy, who presents with acute onset of abdominal pain.  Patient reports that her abdominal pain started suddenly at about 1 PM today, while she was in [facility]. The pain is located at the left upper quadrant, constant, with intermittent accentuation, 9/10 in severity, sharp, radiating to back. It is aggravated by  movement. Patient does not have fever, chills, diarrhea. No bloody stool, no hematuria. She denies any symptoms for UTI. Of note, patient has extensive history of abdominal surgeries. Because of worsening abdominal pain, she comes to the emergency room for further evaluation and treatment. In emergency room, patient was found to have large LEFT rectus sheath hematoma superficial to mesh ventral hernia repair on CT scan. Patient does not have leukocytosis. Her lactic acid was 2.78 on admission. BMP showed potassium 3.4. When I evaluated the patient in ED, she is hemodynamically stable. Hemoglobin level is 13.1. Patient is admitted to inpatient. Surgeon was consulted.   Review of Systems: As presented in the history of presenting illness, rest negative.  Where does patient live?  Lives with her cat at home in [place] Can patient participate in ADLs? Yes  Allergy:  Allergies  Allergen Reactions  . Chlorzoxazone     unknown  . Gabapentin     nausea  . Gemfibrozil     nausea  . Metronidazole     nausea  . Morphine And Related     vomiting  . Nitrofurantoin     nausea  . Olmesartan Medoxomil-Hctz     nausea  . Ondansetron Nausea And Vomiting  . Pentosan Polysulfate  Sodium     unknown  . Propoxyphene N-Acetaminophen     nausea  . Ramipril     REACTION: cough  . Sulfonamide Derivatives     nausea    Past Medical History  Diagnosis Date  . Diverticulosis   . Restless leg syndrome   . IBS (irritable bowel syndrome)   . IC (interstitial cystitis)   . Hypertension   . Diabetes mellitus     diet controlled  . Depression   . Fibromyalgia   . Anxiety   . Osteopenia   . Arthritis     in knees  . Dyslipidemia   . Chronic fatigue syndrome   . Chronic abdominal pain   . Esophageal reflux   . TIA (transient ischemic attack) 08/22/2012  . Obesity   . Sleep apnea     Past Surgical History  Procedure Laterality Date  . Total knee arthroplasty      bilateral  . Tonsillectomy and adenoidectomy    . Breast cyst excision  1978    left  . Laparotomy  1982    cyst removed from right ovary and removal of adhesions  . Abdominal exploration surgery  1983    diverticulum removed  . Appendectomy  1983    with exploration  . Laparoscopic incisional / umbilical / ventral hernia repair  1991, 2001, 2007    x 3  . Vaginal hysterectomy  1992  . Laparotomy  1991  . Scalp surgery  1993, 1998    Removal of tumor  . Nasal sinus surgery  2004  . Rectocele repair  2009  . Cervical fusion  2010  . Bladder hydrodistention      Social History:  reports that she quit smoking about 21 months ago. Her smoking use included Cigarettes. She has a .1 pack-year smoking history. She has never used smokeless tobacco. She reports that she drinks alcohol. She reports that she does not use illicit drugs.  Family History:  Family History  Problem Relation Age of Onset  . Breast cancer Mother   . Dementia Mother   . Breast cancer Maternal Grandmother   . Throat cancer Maternal Grandmother   . Esophageal cancer Maternal Grandmother   . Breast cancer Other     maternal great aunts x 5  . Rheum arthritis Other   . Colon cancer Neg Hx   . Diabetes Father   .  Dementia Father   . Diabetes Maternal Grandfather      Prior to Admission medications   Medication Sig Start Date End Date Taking? Authorizing Provider  ALPRAZolam Duanne Moron) 1 MG tablet Take 1 mg by mouth 3 (three) times daily as needed. For anxiety    Historical Provider, MD  amLODipine (NORVASC) 10 MG tablet TAKE 1 TABLET (10 MG TOTAL) BY MOUTH DAILY. 06/15/14   [name], NP  aspirin EC 325 MG tablet Take 325 mg by mouth daily.    Historical Provider, MD  chlorhexidine (PERIDEX) 0.12 % solution X 14 days per dentist 06/04/14   Historical Provider, MD  desvenlafaxine (PRISTIQ) 50 MG 24 hr tablet Take 150 mg by mouth daily.    Historical Provider, MD  dicyclomine (BENTYL) 20 MG tablet Take 1 tablet (20 mg total) by mouth 2 (two) times daily as needed. Irration bowels 12/29/13   Milus Banister, MD  fentaNYL (DURAGESIC) 100 MCG/HR Place 1 patch onto the skin every 2 days    Historical Provider, MD  fluticasone (FLONASE) 50 MCG/ACT nasal spray Place 2 sprays into the nose daily as needed. 02/11/12   Rowe Clack, MD  furosemide (LASIX) 20 MG tablet TAKE ONE TABLET BY MOUTH EVERY 3 DAYs 03/29/14   [name], NP  glucose blood (ONE TOUCH ULTRA TEST) test strip Use as instructed. Dx: 250.00 07/17/13   [name], NP  ibuprofen (ADVIL,MOTRIN) 600 MG tablet Per dentist 06/04/14   Historical Provider, MD  JANUVIA 100 MG tablet TAKE 1 TABLET (100 MG TOTAL) BY MOUTH DAILY. 07/23/14   [name], NP  lidocaine (LIDODERM) 5 % Place 1 patch onto the skin at bedtime as needed.  03/26/13   Historical Provider, MD  losartan (COZAAR) 100 MG tablet Take 1 tablet (100 mg total) by mouth daily. 06/09/14   [name], NP  metFORMIN (GLUCOPHAGE-XR) 500 MG 24 hr tablet TAKE 4 TABLETS (2,000 MG TOTAL) BY MOUTH DAILY WITH BREAKFAST.    [name], NP  mirabegron ER (MYRBETRIQ) 50 MG TB24 Take 50 mg by mouth every evening.    Historical Provider, MD  NEXIUM 40 MG capsule TAKE 1  CAPSULE (40 MG TOTAL) BY MOUTH DAILY BEFORE BREAKFAST. 07/23/14   [name], NP  nystatin (MYCOSTATIN) powder Apply topically 2 (two) times daily. 03/29/14   [name], NP  oxyCODONE (OXY IR/ROXICODONE) 5 MG immediate release tablet Take 5 mg by mouth 3 (three) times daily.    Historical Provider, MD  POTASSIUM GLUCONATE PO Take 1 tablet by mouth as needed.    Historical Provider, MD  promethazine (PHENERGAN) 25 MG tablet Take 25 mg by mouth  at bedtime. Patient takes every day per patient 08/03/11   Rowe Clack, MD  rOPINIRole (REQUIP) 4 MG tablet TAKE 1 TABLET (4 MG TOTAL) BY MOUTH AT BEDTIME.    [name], NP  tiZANidine (ZANAFLEX) 4 MG tablet Take 4 mg by mouth at bedtime. 01/14/13   Historical Provider, MD  topiramate (TOPAMAX) 50 MG tablet Take 50 mg by mouth 2 (two) times daily as needed. 03/31/14   [name], NP  traZODone (DESYREL) 100 MG tablet Take 1 - 2 tablets at bedtime as needed.    Historical Provider, MD  UNABLE TO FIND C-Pap machine , use nightly    Historical Provider, MD    Physical Exam: Filed Vitals:   07/31/14 1628 07/31/14 1848 07/31/14 1920 07/31/14 1924  BP: 160/80 143/74 144/75   Pulse:   84   Temp:      TempSrc:      Resp:  16 14   SpO2:  100% 100% 98%   General: Not in acute distress HEENT:       Eyes: PERRL, EOMI, no scleral icterus       ENT: No discharge from the ears and nose, no pharynx injection, no tonsillar enlargement.        Neck: No JVD, no bruit, no mass felt. Cardiac: S1/S2, RRR, No murmurs, gallops or rubs Pulm: Good air movement bilaterally. Clear to auscultation bilaterally. No rales, wheezing, rhonchi or rubs. Abd: distended, there is surgical scar in the midline, tender over the LUQ, no rebound pain, no organomegaly, BS present Ext: No edema. 2+DP/PT pulse bilaterally Musculoskeletal: No joint deformities, erythema, or stiffness, ROM full Skin: No rashes.  Neuro: Alert and oriented X3, cranial nerves  II-XII grossly intact, muscle strength 5/5 in all extremeties, sensation to light touch intact.  Psych: Patient is not psychotic, no suicidal or hemocidal ideation.  Labs on Admission:  Basic Metabolic Panel:  Recent Labs Lab 07/31/14 1700 07/31/14 1709  NA 137 136*  K 3.7 3.4*  CL 98 101  CO2 25  --   GLUCOSE 153* 158*  BUN 16 16  CREATININE 0.69 0.70  CALCIUM 9.6  --    Liver Function Tests:  Recent Labs Lab 07/31/14 1700  AST 18  ALT 20  ALKPHOS 78  BILITOT 0.4  PROT 7.1  ALBUMIN 4.0    Recent Labs Lab 07/31/14 1700  LIPASE 13   No results found for this basename: AMMONIA,  in the last 168 hours CBC:  Recent Labs Lab 07/31/14 1700 07/31/14 1709 07/31/14 2001  WBC 8.0  --  6.9  HGB 13.1 14.3 12.5  HCT 38.8 42.0 37.2  MCV 82.4  --  82.9  PLT 211  --  193   Cardiac Enzymes: No results found for this basename: CKTOTAL, CKMB, CKMBINDEX, TROPONINI,  in the last 168 hours  BNP (last 3 results) No results found for this basename: PROBNP,  in the last 8760 hours CBG: No results found for this basename: GLUCAP,  in the last 168 hours  Radiological Exams on Admission: Ct Cta Abd/pel W/cm &/or W/o Cm  07/31/2014   CLINICAL DATA:  LEFT upper quadrant abdominal pain.  EXAM: CTA ABDOMEN AND PELVIS wITHOUT AND WITH CONTRAST  TECHNIQUE: Multidetector CT imaging of the abdomen and pelvis was performed using the standard protocol during bolus administration of intravenous contrast. Multiplanar reconstructed images and MIPs were obtained and reviewed to evaluate the vascular anatomy.  CONTRAST:  118mL OMNIPAQUE IOHEXOL 350 MG/ML SOLN  COMPARISON:  07/16/2013  FINDINGS: Musculoskeletal: Lumbosacral degenerative disease. No aggressive osseous lesions appear  Lung Bases: Normal.  Liver: Mild hepatomegaly with 18.6 cm liver span. Associated rounding of the inferior RIGHT hepatic lobe.  Spleen:  Normal.  Gallbladder:  Normal.  Common bile duct:  Normal.  Pancreas:  Normal.   Adrenal glands:  Normal.  Kidneys:  Normal.  Both ureters appear normal.  Stomach:  Normal.  Small bowel:  Normal.  Colon: Appendix not identified. Diverticulosis without diverticulitis.  Pelvic Genitourinary:  Hysterectomy.  Urinary bladder normal.  Vasculature: No acute vascular abnormality.  Body Wall: LEFT rectus sheath hematoma is present superficial to mesh ventral hernia repair. This measures 44 mm AP, 10 cm transverse and 13 cm craniocaudal. No active extravasation into the hematoma. Midline scarring of the abdominal wall.  Review of the MIP images confirms the above findings.  IMPRESSION: Large LEFT rectus sheath hematoma superficial to mesh ventral hernia repair. No acute intra-abdominal abnormality.   Electronically Signed   By: Dereck Ligas M.D.   On: 07/31/2014 18:30    EKG: Independently reviewed. Sinus rhythm, regular, normal axis, normal R wave progression, normal QT interval, No ischemic change in T waves or ST segments.  Assessment/Plan Principal Problem:   Abdominal pain Active Problems:   Diabetes mellitus, Type 2   DEPRESSION   HYPERTENSION   Irritable bowel syndrome (IBS)   OSA (obstructive sleep apnea)   TIA (transient ischemic attack)   Rectus sheath hematoma   1. Abdominal pain: As evidenced by CT, pt has rectus sheath hematoma superficial to mesh ventral hernia repair. This is the most likely etiology for her abdominal pain. Lipase negative on admission.  - admitted to [place] bed -Pain control, patient is allergic to morphine, tolerated Dilaudid in the past -Phenergan for nausea -IV fluid: Received 1 L normal saline bolus in ED, will continue normal saline at 125 cc per hour -Monitor hemoglobin level, CBC q 6 hour -Type and cross - INR, PTT -Trend lactic acid level - Surgeon consulted by ED, with followup recommendations.  2. hypertension: Blood pressure is okay, 143/74 on admission -Discontinue home Lasix -Continue other home medications  3.  depression: Stable, no suicidal or homicidal ideation -Continue home medications  4.diabetes mellitus, type II: Last A1c 6.9 on 06/08/14. -Discontinue home oral medications -Sliding scale insulin  5. OSA: cpap  6. IBS: Stable, no signs of acute flare up -Continue home medications   DVT ppx: SQ Heparin   Code Status: Full code Family Communication:  Yes, patient's friend at bed side Disposition Plan: Admit to inpatient  Ivor Costa [facility] Pager [phone]  If 7PM-7AM, please contact night-coverage [email or web address] Password [facility] 07/31/2014, 8:26 PM

## 2014-07-31 NOTE — Consult Note (Signed)
Reason for Consult:rectus  hematoma Referring Physician: Dr. Mick Sell is an 58 y.o. female.  HPI: I have been asked to evaluate this patient with a spontaneous rectus hematoma. She developed the sudden onset of  Left sided abdominal pain while returning from [facility]. She denies any trauma. She denies any heavy lifting. She is not on blood thinning medication. She is otherwise without complaints.  Past Medical History  Diagnosis Date  . Diverticulosis   . Restless leg syndrome   . IBS (irritable bowel syndrome)   . IC (interstitial cystitis)   . Hypertension   . Diabetes mellitus     diet controlled  . Depression   . Fibromyalgia   . Anxiety   . Osteopenia   . Arthritis     in knees  . Dyslipidemia   . Chronic fatigue syndrome   . Chronic abdominal pain   . Esophageal reflux   . TIA (transient ischemic attack) 08/22/2012  . Obesity   . Sleep apnea     Past Surgical History  Procedure Laterality Date  . Total knee arthroplasty      bilateral  . Tonsillectomy and adenoidectomy    . Breast cyst excision  1978    left  . Laparotomy  1982    cyst removed from right ovary and removal of adhesions  . Abdominal exploration surgery  1983    diverticulum removed  . Appendectomy  1983    with exploration  . Laparoscopic incisional / umbilical / ventral hernia repair  1991, 2001, 2007    x 3  . Vaginal hysterectomy  1992  . Laparotomy  1991  . Scalp surgery  1993, 1998    Removal of tumor  . Nasal sinus surgery  2004  . Rectocele repair  2009  . Cervical fusion  2010  . Bladder hydrodistention      Family History  Problem Relation Age of Onset  . Breast cancer Mother   . Dementia Mother   . Breast cancer Maternal Grandmother   . Throat cancer Maternal Grandmother   . Esophageal cancer Maternal Grandmother   . Breast cancer Other     maternal great aunts x 5  . Rheum arthritis Other   . Colon cancer Neg Hx   . Diabetes Father   . Dementia Father    . Diabetes Maternal Grandfather     Social History:  reports that she quit smoking about 21 months ago. Her smoking use included Cigarettes. She has a .1 pack-year smoking history. She has never used smokeless tobacco. She reports that she drinks alcohol. She reports that she does not use illicit drugs.  Allergies:  Allergies  Allergen Reactions  . Chlorzoxazone Other (See Comments)    Unknown reaction  . Gabapentin Nausea And Vomiting  . Gemfibrozil Nausea And Vomiting  . Metronidazole Nausea And Vomiting  . Morphine And Related Nausea And Vomiting  . Nitrofurantoin Nausea And Vomiting  . Olmesartan Medoxomil-Hctz Nausea And Vomiting  . Ondansetron Nausea And Vomiting  . Pentosan Polysulfate Sodium Other (See Comments)    Unknown reaction  . Propoxyphene N-Acetaminophen Nausea And Vomiting  . Ramipril Cough  . Sulfonamide Derivatives Nausea And Vomiting    Medications: I have reviewed the patient's current medications.  Results for orders placed during the hospital encounter of 07/31/14 (from the past 48 hour(s))  CBC     Status: None   Collection Time    07/31/14  5:00 PM  Result Value Ref Range   WBC 8.0  4.0 - 10.5 K/uL   RBC 4.71  3.87 - 5.11 MIL/uL   Hemoglobin 13.1  12.0 - 15.0 g/dL   HCT 38.8  36.0 - 46.0 %   MCV 82.4  78.0 - 100.0 fL   MCH 27.8  26.0 - 34.0 pg   MCHC 33.8  30.0 - 36.0 g/dL   RDW 12.9  11.5 - 15.5 %   Platelets 211  150 - 400 K/uL  COMPREHENSIVE METABOLIC PANEL     Status: Abnormal   Collection Time    07/31/14  5:00 PM      Result Value Ref Range   Sodium 137  137 - 147 mEq/L   Potassium 3.7  3.7 - 5.3 mEq/L   Chloride 98  96 - 112 mEq/L   CO2 25  19 - 32 mEq/L   Glucose, Bld 153 (*) 70 - 99 mg/dL   BUN 16  6 - 23 mg/dL   Creatinine, Ser 0.69  0.50 - 1.10 mg/dL   Calcium 9.6  8.4 - 10.5 mg/dL   Total Protein 7.1  6.0 - 8.3 g/dL   Albumin 4.0  3.5 - 5.2 g/dL   AST 18  0 - 37 U/L   ALT 20  0 - 35 U/L   Alkaline Phosphatase 78  39 -  117 U/L   Total Bilirubin 0.4  0.3 - 1.2 mg/dL   GFR calc non Af Amer >90  >90 mL/min   GFR calc Af Amer >90  >90 mL/min   Comment: (NOTE)     The eGFR has been calculated using the CKD EPI equation.     This calculation has not been validated in all clinical situations.     eGFR's persistently <90 mL/min signify possible Chronic Kidney     Disease.   Anion gap 14  5 - 15  LIPASE, BLOOD     Status: None   Collection Time    07/31/14  5:00 PM      Result Value Ref Range   Lipase 13  11 - 59 U/L  I-STAT CHEM 8, ED     Status: Abnormal   Collection Time    07/31/14  5:09 PM      Result Value Ref Range   Sodium 136 (*) 137 - 147 mEq/L   Potassium 3.4 (*) 3.7 - 5.3 mEq/L   Chloride 101  96 - 112 mEq/L   BUN 16  6 - 23 mg/dL   Creatinine, Ser 0.70  0.50 - 1.10 mg/dL   Glucose, Bld 158 (*) 70 - 99 mg/dL   Calcium, Ion 1.09 (*) 1.12 - 1.23 mmol/L   TCO2 24  0 - 100 mmol/L   Hemoglobin 14.3  12.0 - 15.0 g/dL   HCT 42.0  36.0 - 46.0 %  I-STAT CG4 LACTIC ACID, ED     Status: Abnormal   Collection Time    07/31/14  5:09 PM      Result Value Ref Range   Lactic Acid, Venous 2.78 (*) 0.5 - 2.2 mmol/L  CBC     Status: None   Collection Time    07/31/14  8:01 PM      Result Value Ref Range   WBC 6.9  4.0 - 10.5 K/uL   RBC 4.49  3.87 - 5.11 MIL/uL   Hemoglobin 12.5  12.0 - 15.0 g/dL   HCT 37.2  36.0 - 46.0 %   MCV 82.9  78.0 - 100.0 fL   MCH 27.8  26.0 - 34.0 pg   MCHC 33.6  30.0 - 36.0 g/dL   RDW 13.0  11.5 - 15.5 %   Platelets 193  150 - 400 K/uL  TYPE AND SCREEN     Status: None   Collection Time    07/31/14  8:35 PM      Result Value Ref Range   ABO/RH(D) A POS     Antibody Screen NEG     Sample Expiration 08/03/2014      Ct Cta Abd/pel W/cm &/or W/o Cm  07/31/2014   CLINICAL DATA:  LEFT upper quadrant abdominal pain.  EXAM: CTA ABDOMEN AND PELVIS wITHOUT AND WITH CONTRAST  TECHNIQUE: Multidetector CT imaging of the abdomen and pelvis was performed using the standard  protocol during bolus administration of intravenous contrast. Multiplanar reconstructed images and MIPs were obtained and reviewed to evaluate the vascular anatomy.  CONTRAST:  149m OMNIPAQUE IOHEXOL 350 MG/ML SOLN  COMPARISON:  07/16/2013  FINDINGS: Musculoskeletal: Lumbosacral degenerative disease. No aggressive osseous lesions appear  Lung Bases: Normal.  Liver: Mild hepatomegaly with 18.6 cm liver span. Associated rounding of the inferior RIGHT hepatic lobe.  Spleen:  Normal.  Gallbladder:  Normal.  Common bile duct:  Normal.  Pancreas:  Normal.  Adrenal glands:  Normal.  Kidneys:  Normal.  Both ureters appear normal.  Stomach:  Normal.  Small bowel:  Normal.  Colon: Appendix not identified. Diverticulosis without diverticulitis.  Pelvic Genitourinary:  Hysterectomy.  Urinary bladder normal.  Vasculature: No acute vascular abnormality.  Body Wall: LEFT rectus sheath hematoma is present superficial to mesh ventral hernia repair. This measures 44 mm AP, 10 cm transverse and 13 cm craniocaudal. No active extravasation into the hematoma. Midline scarring of the abdominal wall.  Review of the MIP images confirms the above findings.  IMPRESSION: Large LEFT rectus sheath hematoma superficial to mesh ventral hernia repair. No acute intra-abdominal abnormality.   Electronically Signed   By: GDereck LigasM.D.   On: 07/31/2014 18:30    Review of Systems  All other systems reviewed and are negative.  Blood pressure 184/87, pulse 70, temperature 99.6 F (37.6 C), temperature source Oral, resp. rate 14, height 5' 5.5" (1.664 m), weight 245 lb 3.2 oz (111.222 kg), SpO2 97.00%. Physical Exam  Constitutional: No distress.  Morbidly obese  GI: Soft. There is tenderness. There is guarding.  Abdomen is obese.  There is tenderness with guarding on the left side of the abdomen    Assessment/Plan: Rectus hematoma  No surgical intervention is currently indicated. Recommendation is for pain control only. She is not  on any anti-coagulation medications  and I would not place her on a Lovenox.  I explained to her that this will resolve spontaneously over time. Rarely is any surgical intervention needed.  [name] A 07/31/2014, 10:24 PM

## 2014-08-01 DIAGNOSIS — S301XXA Contusion of abdominal wall, initial encounter: Secondary | ICD-10-CM

## 2014-08-01 DIAGNOSIS — G894 Chronic pain syndrome: Secondary | ICD-10-CM

## 2014-08-01 DIAGNOSIS — E119 Type 2 diabetes mellitus without complications: Secondary | ICD-10-CM

## 2014-08-01 DIAGNOSIS — R1012 Left upper quadrant pain: Secondary | ICD-10-CM

## 2014-08-01 DIAGNOSIS — G4733 Obstructive sleep apnea (adult) (pediatric): Secondary | ICD-10-CM

## 2014-08-01 DIAGNOSIS — I10 Essential (primary) hypertension: Secondary | ICD-10-CM

## 2014-08-01 LAB — APTT: APTT: 25 s (ref 24–37)

## 2014-08-01 LAB — BASIC METABOLIC PANEL
Anion gap: 11 (ref 5–15)
BUN: 12 mg/dL (ref 6–23)
CO2: 25 meq/L (ref 19–32)
CREATININE: 0.72 mg/dL (ref 0.50–1.10)
Calcium: 8.7 mg/dL (ref 8.4–10.5)
Chloride: 103 mEq/L (ref 96–112)
GFR calc Af Amer: >90 mL/min (ref >90)
GFR calc non Af Amer: >90 mL/min (ref >90)
GLUCOSE: 149 mg/dL — AB (ref 70–99)
Potassium: 4.1 mEq/L (ref 3.7–5.3)
Sodium: 139 mEq/L (ref 137–147)

## 2014-08-01 LAB — CBC
HCT: 34.8 % — ABNORMAL LOW (ref 36.0–46.0)
Hemoglobin: 11.6 g/dL — ABNORMAL LOW (ref 12.0–15.0)
MCH: 27.7 pg (ref 26.0–34.0)
MCHC: 33.3 g/dL (ref 30.0–36.0)
MCV: 83.1 fL (ref 78.0–100.0)
Platelets: 202 10*3/uL (ref 150–400)
RBC: 4.19 MIL/uL (ref 3.87–5.11)
RDW: 13.1 % (ref 11.5–15.5)
WBC: 6 10*3/uL (ref 4.0–10.5)

## 2014-08-01 LAB — GLUCOSE, CAPILLARY
GLUCOSE-CAPILLARY: 164 mg/dL — AB (ref 70–99)
Glucose-Capillary: 136 mg/dL — ABNORMAL HIGH (ref 70–99)
Glucose-Capillary: 143 mg/dL — ABNORMAL HIGH (ref 70–99)
Glucose-Capillary: 184 mg/dL — ABNORMAL HIGH (ref 70–99)

## 2014-08-01 LAB — LACTIC ACID, PLASMA
LACTIC ACID, VENOUS: 1.3 mmol/L (ref 0.5–2.2)
Lactic Acid, Venous: 1 mmol/L (ref 0.5–2.2)

## 2014-08-01 LAB — PROTIME-INR
INR: 1.13 (ref 0.00–1.49)
Prothrombin Time: 14.5 seconds (ref 11.6–15.2)

## 2014-08-01 MED ORDER — OXYCODONE HCL 5 MG PO TABS
15.0000 mg | ORAL_TABLET | ORAL | Status: DC | PRN
  Administered 2014-08-01 (×2): 20 mg via ORAL
  Administered 2014-08-01: 15 mg via ORAL
  Administered 2014-08-02: 20 mg via ORAL
  Filled 2014-08-01: qty 3
  Filled 2014-08-01 (×3): qty 4

## 2014-08-01 MED ORDER — FUROSEMIDE 20 MG PO TABS
20.0000 mg | ORAL_TABLET | ORAL | Status: DC
  Administered 2014-08-01 – 2014-08-03 (×2): 20 mg via ORAL
  Filled 2014-08-01 (×2): qty 1

## 2014-08-01 MED ORDER — FENTANYL 50 MCG/HR TD PT72
100.0000 ug | MEDICATED_PATCH | TRANSDERMAL | Status: DC
  Administered 2014-08-01: 100 ug via TRANSDERMAL
  Filled 2014-08-01: qty 2

## 2014-08-01 NOTE — Progress Notes (Addendum)
PATIENT DETAILS Name: [name] Age: 58 y.o. Sex: female Date of Birth: February 07, 1956 Admit Date: 07/31/2014 Admitting Physician Ivor Costa, MD PCP:O'SULLIVAN,MELISSA S., NP  Subjective: Main complaint is quadrant of the abdomen. Otherwise no major complaints  Assessment/Plan: Principal Problem: Left rectus hematoma - Patient developed a spontaneous hematoma, only on aspirin-not on any anticoagulation. - Seen by general surgery, recommendations are for conservative care and abdominal binder. - Still with significant pain, patient has chronic pain syndrome and sees pain management as an outpatient-normally on 100 mcg of transdermal fentanyl, along with oxycodone for breakthrough pain. Will increase oxycodone to 15-20 mg every 4 hours for breakthrough for moderate pain, use Dilaudid only for severe pain and continue with transdermal fentanyl-but increase to 100 mcg (placed on 50 mcg on admission). Have counseled patient that she is going to have pain for the next few weeks, and will need to optimize her pain regimen prior to discharge. Hemoglobin remaines stable. However will hold aspirin for the next few days.  Active Problems:   Diabetes mellitus, Type 2 - CBG is currently stable-continue with sliding scale insulin, resume Januvia and metformin on discharge.  Hypertension - Currently moderately controlled with amlodipine and losartan. Resume Lasix. Suspect it to be on the high side because of pain response  History of TIA - Hold Aspirin for the next few days. Stable, no deficits on exam    DEPRESSION - Stable, continue with trazodone,Pristiq and other home medications.    Irritable bowel syndrome (IBS) - Predominantly diarrhea-claims to be stable- currently at baseline. Continue with Bentyl    OSA (obstructive sleep apnea) -c/w CPAP QHS  History of chronic pain syndrome - claims secondary to chronic interstitial cystitis - On chronic narcotics-please see  above  Disposition: Remain inpatient  DVT Prophylaxis:  SCD's  Code Status: Full code   Family Communication None at bedside  Procedures:  None  CONSULTS:  None  Time spent 40 minutes-which includes 50% of the time with face-to-face with patient/ family and coordinating care related to the above assessment and plan.    MEDICATIONS: Scheduled Meds: . amLODipine  10 mg Oral Daily  . antiseptic oral rinse  7 mL Mouth Rinse q12n4p  . chlorhexidine  15 mL Mouth Rinse BID  . desvenlafaxine  150 mg Oral Daily  . fentaNYL  100 mcg Transdermal Q72H  . [START ON 08/02/2014] Influenza vac split quadrivalent PF  0.5 mL Intramuscular Tomorrow-1000  . insulin aspart  0-9 Units Subcutaneous TID WC  . losartan  100 mg Oral Daily  . mirabegron ER  50 mg Oral QPM  . pantoprazole  40 mg Oral Daily  . rOPINIRole  1 mg Oral QHS  . tiZANidine  4 mg Oral QHS  . topiramate  50 mg Oral Q12H   Continuous Infusions:  PRN Meds:.ALPRAZolam, dicyclomine, fluticasone, HYDROmorphone (DILAUDID) injection, lidocaine, oxyCODONE, promethazine, traZODone  Antibiotics: Anti-infectives   None       PHYSICAL EXAM: Vital signs in last 24 hours: Filed Vitals:   07/31/14 1924 07/31/14 2101 08/01/14 0014 08/01/14 0627  BP:  184/87  136/60  Pulse:  70 78 64  Temp:  99.6 F (37.6 C)  98.3 F (36.8 C)  TempSrc:  Oral  Oral  Resp:   16 15  Height:  5' 5.5" (1.664 m)    Weight:  111.222 kg (245 lb 3.2 oz)  111.267 kg (245 lb 4.8 oz)  SpO2: 98% 97% 97% 99%  Weight change:  Filed Weights   07/31/14 2101 08/01/14 0627  Weight: 111.222 kg (245 lb 3.2 oz) 111.267 kg (245 lb 4.8 oz)   Body mass index is 40.18 kg/(m^2).   Gen Exam: Awake and alert with clear speech.   Neck: Supple, No JVD.   Chest: B/L Clear.   CVS: S1 S2 Regular, no murmurs.  Abdomen: soft, BS +, very tender in LUQ area, non distended.  Extremities: no edema, lower extremities warm to touch. Neurologic: Non Focal.   Skin:  No Rash.  Wounds: N/A.    Intake/Output from previous day:  Intake/Output Summary (Last 24 hours) at 08/01/14 1023 Last data filed at 08/01/14 0900  Gross per 24 hour  Intake   1240 ml  Output      0 ml  Net   1240 ml     LAB RESULTS: CBC  Recent Labs Lab 07/31/14 1700 07/31/14 1709 07/31/14 2001 08/01/14 0228  WBC 8.0  --  6.9 6.0  HGB 13.1 14.3 12.5 11.6*  HCT 38.8 42.0 37.2 34.8*  PLT 211  --  193 202  MCV 82.4  --  82.9 83.1  MCH 27.8  --  27.8 27.7  MCHC 33.8  --  33.6 33.3  RDW 12.9  --  13.0 13.1    Chemistries   Recent Labs Lab 07/31/14 1700 07/31/14 1709 08/01/14 0228  NA 137 136* 139  K 3.7 3.4* 4.1  CL 98 101 103  CO2 25  --  25  GLUCOSE 153* 158* 149*  BUN 16 16 12   CREATININE 0.69 0.70 0.72  CALCIUM 9.6  --  8.7    CBG:  Recent Labs Lab 07/31/14 2239 08/01/14 0758  GLUCAP 167* 136*    GFR Estimated Creatinine Clearance: 97.3 ml/min (by C-G formula based on Cr of 0.72).  Coagulation profile  Recent Labs Lab 08/01/14 0228  INR 1.13    Cardiac Enzymes No results found for this basename: CK, CKMB, TROPONINI, MYOGLOBIN,  in the last 168 hours  No components found with this basename: POCBNP,  No results found for this basename: DDIMER,  in the last 72 hours No results found for this basename: HGBA1C,  in the last 72 hours No results found for this basename: CHOL, HDL, LDLCALC, TRIG, CHOLHDL, LDLDIRECT,  in the last 72 hours No results found for this basename: TSH, T4TOTAL, FREET3, T3FREE, THYROIDAB,  in the last 72 hours No results found for this basename: VITAMINB12, FOLATE, FERRITIN, TIBC, IRON, RETICCTPCT,  in the last 72 hours  Recent Labs  07/31/14 1700  LIPASE 13    Urine Studies No results found for this basename: UACOL, UAPR, USPG, UPH, UTP, UGL, UKET, UBIL, UHGB, UNIT, UROB, ULEU, UEPI, UWBC, URBC, UBAC, CAST, CRYS, UCOM, BILUA,  in the last 72 hours  MICROBIOLOGY: No results found for this or any previous visit  (from the past 240 hour(s)).  RADIOLOGY STUDIES/RESULTS: Ct Cta Abd/pel W/cm &/or W/o Cm  07/31/2014   CLINICAL DATA:  LEFT upper quadrant abdominal pain.  EXAM: CTA ABDOMEN AND PELVIS wITHOUT AND WITH CONTRAST  TECHNIQUE: Multidetector CT imaging of the abdomen and pelvis was performed using the standard protocol during bolus administration of intravenous contrast. Multiplanar reconstructed images and MIPs were obtained and reviewed to evaluate the vascular anatomy.  CONTRAST:  18mL OMNIPAQUE IOHEXOL 350 MG/ML SOLN  COMPARISON:  07/16/2013  FINDINGS: Musculoskeletal: Lumbosacral degenerative disease. No aggressive osseous lesions appear  Lung Bases: Normal.  Liver: Mild hepatomegaly with 18.6  cm liver span. Associated rounding of the inferior RIGHT hepatic lobe.  Spleen:  Normal.  Gallbladder:  Normal.  Common bile duct:  Normal.  Pancreas:  Normal.  Adrenal glands:  Normal.  Kidneys:  Normal.  Both ureters appear normal.  Stomach:  Normal.  Small bowel:  Normal.  Colon: Appendix not identified. Diverticulosis without diverticulitis.  Pelvic Genitourinary:  Hysterectomy.  Urinary bladder normal.  Vasculature: No acute vascular abnormality.  Body Wall: LEFT rectus sheath hematoma is present superficial to mesh ventral hernia repair. This measures 44 mm AP, 10 cm transverse and 13 cm craniocaudal. No active extravasation into the hematoma. Midline scarring of the abdominal wall.  Review of the MIP images confirms the above findings.  IMPRESSION: Large LEFT rectus sheath hematoma superficial to mesh ventral hernia repair. No acute intra-abdominal abnormality.   Electronically Signed   By: Dereck Ligas M.D.   On: 07/31/2014 18:30    Oren Binet, MD  [facility] Pager:[phone]  If 7PM-7AM, please contact night-coverage [email or web address] Password [email or web address] 08/01/2014, 10:23 AM   LOS: 1 day   **Disclaimer: This note may have been dictated with voice recognition software. Similar sounding words  can inadvertently be transcribed and this note may contain transcription errors which may not have been corrected upon publication of note.**

## 2014-08-01 NOTE — Progress Notes (Signed)
  Subjective: Pt sore LUQ abdomen  Objective: Vital signs in last 24 hours: Temp:  [97.9 F (36.6 C)-99.6 F (37.6 C)] 98.3 F (36.8 C) (09/13 0627) Pulse Rate:  [64-84] 64 (09/13 0627) Resp:  [14-18] 15 (09/13 0627) BP: (136-187)/(60-94) 136/60 mmHg (09/13 0627) SpO2:  [97 %-100 %] 99 % (09/13 0627) Weight:  [245 lb 3.2 oz (111.222 kg)-245 lb 4.8 oz (111.267 kg)] 245 lb 4.8 oz (111.267 kg) (09/13 0627) Last BM Date: 07/31/14  Intake/Output from previous day: 09/12 0701 - 09/13 0700 In: 1000 [IV Piggyback:1000] Out: -  Intake/Output this shift:    GI: scar noted.  tender LUQ with mass.  no recurrent hernia  Lab Results:   Recent Labs  07/31/14 2001 08/01/14 0228  WBC 6.9 6.0  HGB 12.5 11.6*  HCT 37.2 34.8*  PLT 193 202   BMET  Recent Labs  07/31/14 1700 07/31/14 1709 08/01/14 0228  NA 137 136* 139  K 3.7 3.4* 4.1  CL 98 101 103  CO2 25  --  25  GLUCOSE 153* 158* 149*  BUN 16 16 12   CREATININE 0.69 0.70 0.72  CALCIUM 9.6  --  8.7   PT/INR  Recent Labs  08/01/14 0228  LABPROT 14.5  INR 1.13   ABG No results found for this basename: PHART, PCO2, PO2, HCO3,  in the last 72 hours  Studies/Results: Ct Cta Abd/pel W/cm &/or W/o Cm  07/31/2014   CLINICAL DATA:  LEFT upper quadrant abdominal pain.  EXAM: CTA ABDOMEN AND PELVIS wITHOUT AND WITH CONTRAST  TECHNIQUE: Multidetector CT imaging of the abdomen and pelvis was performed using the standard protocol during bolus administration of intravenous contrast. Multiplanar reconstructed images and MIPs were obtained and reviewed to evaluate the vascular anatomy.  CONTRAST:  124mL OMNIPAQUE IOHEXOL 350 MG/ML SOLN  COMPARISON:  07/16/2013  FINDINGS: Musculoskeletal: Lumbosacral degenerative disease. No aggressive osseous lesions appear  Lung Bases: Normal.  Liver: Mild hepatomegaly with 18.6 cm liver span. Associated rounding of the inferior RIGHT hepatic lobe.  Spleen:  Normal.  Gallbladder:  Normal.  Common bile  duct:  Normal.  Pancreas:  Normal.  Adrenal glands:  Normal.  Kidneys:  Normal.  Both ureters appear normal.  Stomach:  Normal.  Small bowel:  Normal.  Colon: Appendix not identified. Diverticulosis without diverticulitis.  Pelvic Genitourinary:  Hysterectomy.  Urinary bladder normal.  Vasculature: No acute vascular abnormality.  Body Wall: LEFT rectus sheath hematoma is present superficial to mesh ventral hernia repair. This measures 44 mm AP, 10 cm transverse and 13 cm craniocaudal. No active extravasation into the hematoma. Midline scarring of the abdominal wall.  Review of the MIP images confirms the above findings.  IMPRESSION: Large LEFT rectus sheath hematoma superficial to mesh ventral hernia repair. No acute intra-abdominal abnormality.   Electronically Signed   By: Dereck Ligas M.D.   On: 07/31/2014 18:30    Anti-infectives: Anti-infectives   None      Assessment/Plan: Left rectus hematoma Abdominal binder Can follow up as needed with Dr Excell Seltzer as outpatient as needed.  This will usually resolve on its own.  Will sign off  LOS: 1 day    Cherylanne Ardelean A. 08/01/2014

## 2014-08-02 DIAGNOSIS — F329 Major depressive disorder, single episode, unspecified: Secondary | ICD-10-CM

## 2014-08-02 DIAGNOSIS — F3289 Other specified depressive episodes: Secondary | ICD-10-CM

## 2014-08-02 DIAGNOSIS — M7981 Nontraumatic hematoma of soft tissue: Secondary | ICD-10-CM | POA: Diagnosis not present

## 2014-08-02 LAB — CBC
HCT: 36.9 % (ref 36.0–46.0)
Hemoglobin: 12 g/dL (ref 12.0–15.0)
MCH: 27.5 pg (ref 26.0–34.0)
MCHC: 32.5 g/dL (ref 30.0–36.0)
MCV: 84.4 fL (ref 78.0–100.0)
Platelets: 201 10*3/uL (ref 150–400)
RBC: 4.37 MIL/uL (ref 3.87–5.11)
RDW: 13.2 % (ref 11.5–15.5)
WBC: 5.7 10*3/uL (ref 4.0–10.5)

## 2014-08-02 LAB — GLUCOSE, CAPILLARY
GLUCOSE-CAPILLARY: 169 mg/dL — AB (ref 70–99)
Glucose-Capillary: 161 mg/dL — ABNORMAL HIGH (ref 70–99)
Glucose-Capillary: 151 mg/dL — ABNORMAL HIGH (ref 70–99)
Glucose-Capillary: 195 mg/dL — ABNORMAL HIGH (ref 70–99)

## 2014-08-02 MED ORDER — OXYCODONE HCL 5 MG PO TABS
25.0000 mg | ORAL_TABLET | ORAL | Status: DC | PRN
  Administered 2014-08-02 – 2014-08-03 (×4): 30 mg via ORAL
  Filled 2014-08-02 (×4): qty 6

## 2014-08-02 MED ORDER — HYDROMORPHONE HCL PF 1 MG/ML IJ SOLN
1.0000 mg | Freq: Four times a day (QID) | INTRAMUSCULAR | Status: DC | PRN
  Administered 2014-08-02 – 2014-08-03 (×4): 1 mg via INTRAVENOUS
  Filled 2014-08-02 (×4): qty 1

## 2014-08-02 MED ORDER — POLYETHYLENE GLYCOL 3350 17 G PO PACK
17.0000 g | PACK | Freq: Two times a day (BID) | ORAL | Status: DC
  Administered 2014-08-02 – 2014-08-03 (×3): 17 g via ORAL
  Filled 2014-08-02 (×4): qty 1

## 2014-08-02 NOTE — Progress Notes (Signed)
PATIENT DETAILS Name: [name] Age: 58 y.o. Sex: female Date of Birth: Apr 18, 1956 Admit Date: 07/31/2014 Admitting Physician Ivor Costa, MD PCP:O'SULLIVAN,MELISSA S., NP  Subjective: Continues to have left-sided abdominal pain-otherwise no major complaints  Assessment/Plan: Principal Problem: Left rectus hematoma - Patient developed a spontaneous hematoma, only on aspirin-not on any anticoagulation. - Seen by general surgery, recommendations are for conservative care and abdominal binder. General surgery has signed off - continue with transdermal fentanyl 100 mcg to 72 hours, increase oxycodone to 25-30 mg every 4 hours for breakthrough. -Patient has been explained that, she would likely have ongoing abdominal pain for the next few weeks. I will have physical therapy see her, and plan for discharge either to SNF all home with home health services. Patient is agreeable with this plan. Hemoglobin used to be stable at 12.0 today.   Active Problems:   Diabetes mellitus, Type 2 - CBG is currently stable-continue with sliding scale insulin, resume Januvia and metformin on discharge.  Hypertension - Currently  controlled with amlodipine, losartan and Lasix.   History of TIA - Hold Aspirin for the next few days. Stable, no deficits on exam    DEPRESSION - Stable, continue with trazodone,Pristiq and other home medications.    Irritable bowel syndrome (IBS) - Predominantly diarrhea-claims to be stable- currently at baseline. Continue with Bentyl    OSA (obstructive sleep apnea) -c/w CPAP QHS  History of chronic pain syndrome - claims secondary to chronic interstitial cystitis - On chronic narcotics-please see above  Disposition: Remain inpatient- either SNF or home with home health services today or tomorrow  DVT Prophylaxis:  SCD's  Code Status: Full code   Family Communication None at bedside  Procedures:  None  CONSULTS:   None  MEDICATIONS: Scheduled Meds: . amLODipine  10 mg Oral Daily  . antiseptic oral rinse  7 mL Mouth Rinse q12n4p  . chlorhexidine  15 mL Mouth Rinse BID  . desvenlafaxine  150 mg Oral Daily  . fentaNYL  100 mcg Transdermal Q72H  . furosemide  20 mg Oral QODAY  . insulin aspart  0-9 Units Subcutaneous TID WC  . losartan  100 mg Oral Daily  . mirabegron ER  50 mg Oral QPM  . pantoprazole  40 mg Oral Daily  . polyethylene glycol  17 g Oral BID  . rOPINIRole  1 mg Oral QHS  . tiZANidine  4 mg Oral QHS  . topiramate  50 mg Oral Q12H   Continuous Infusions:  PRN Meds:.ALPRAZolam, dicyclomine, fluticasone, HYDROmorphone (DILAUDID) injection, lidocaine, oxyCODONE, promethazine, traZODone  Antibiotics: Anti-infectives   None       PHYSICAL EXAM: Vital signs in last 24 hours: Filed Vitals:   08/01/14 1427 08/01/14 2110 08/01/14 2246 08/02/14 0541  BP: 126/68 133/74  115/66  Pulse: 77 73 75 63  Temp: 99 F (37.2 C) 98.7 F (37.1 C)  97.6 F (36.4 C)  TempSrc: Oral Oral  Oral  Resp: 16 13 16 14   Height:      Weight:    111.177 kg (245 lb 1.6 oz)  SpO2: 98% 96% 96% 98%    Weight change: -0.045 kg (-1.6 oz) Filed Weights   07/31/14 2101 08/01/14 0627 08/02/14 0541  Weight: 111.222 kg (245 lb 3.2 oz) 111.267 kg (245 lb 4.8 oz) 111.177 kg (245 lb 1.6 oz)   Body mass index is 40.15 kg/(m^2).   Gen Exam: Awake and alert with clear speech.  Neck: Supple, No JVD.   Chest: B/L Clear.  No rales or rhonchi CVS: S1 S2 Regular, no murmurs.  Abdomen: soft, BS +, tender in LUQ area, non distended.  Extremities: no edema, lower extremities warm to touch. Neurologic: Non Focal.   Skin: No Rash.  Wounds: N/A.    Intake/Output from previous day:  Intake/Output Summary (Last 24 hours) at 08/02/14 1128 Last data filed at 08/02/14 0919  Gross per 24 hour  Intake    740 ml  Output    840 ml  Net   -100 ml     LAB RESULTS: CBC  Recent Labs Lab 07/31/14 1700  07/31/14 1709 07/31/14 2001 08/01/14 0228 08/02/14 0843  WBC 8.0  --  6.9 6.0 5.7  HGB 13.1 14.3 12.5 11.6* 12.0  HCT 38.8 42.0 37.2 34.8* 36.9  PLT 211  --  193 202 201  MCV 82.4  --  82.9 83.1 84.4  MCH 27.8  --  27.8 27.7 27.5  MCHC 33.8  --  33.6 33.3 32.5  RDW 12.9  --  13.0 13.1 13.2    Chemistries   Recent Labs Lab 07/31/14 1700 07/31/14 1709 08/01/14 0228  NA 137 136* 139  K 3.7 3.4* 4.1  CL 98 101 103  CO2 25  --  25  GLUCOSE 153* 158* 149*  BUN 16 16 12   CREATININE 0.69 0.70 0.72  CALCIUM 9.6  --  8.7    CBG:  Recent Labs Lab 08/01/14 0758 08/01/14 1155 08/01/14 1712 08/01/14 2104 08/02/14 0741  GLUCAP 136* 164* 143* 184* 161*    GFR Estimated Creatinine Clearance: 97.3 ml/min (by C-G formula based on Cr of 0.72).  Coagulation profile  Recent Labs Lab 08/01/14 0228  INR 1.13    Cardiac Enzymes No results found for this basename: CK, CKMB, TROPONINI, MYOGLOBIN,  in the last 168 hours  No components found with this basename: POCBNP,  No results found for this basename: DDIMER,  in the last 72 hours No results found for this basename: HGBA1C,  in the last 72 hours No results found for this basename: CHOL, HDL, LDLCALC, TRIG, CHOLHDL, LDLDIRECT,  in the last 72 hours No results found for this basename: TSH, T4TOTAL, FREET3, T3FREE, THYROIDAB,  in the last 72 hours No results found for this basename: VITAMINB12, FOLATE, FERRITIN, TIBC, IRON, RETICCTPCT,  in the last 72 hours  Recent Labs  07/31/14 1700  LIPASE 13    Urine Studies No results found for this basename: UACOL, UAPR, USPG, UPH, UTP, UGL, UKET, UBIL, UHGB, UNIT, UROB, ULEU, UEPI, UWBC, URBC, UBAC, CAST, CRYS, UCOM, BILUA,  in the last 72 hours  MICROBIOLOGY: No results found for this or any previous visit (from the past 240 hour(s)).  RADIOLOGY STUDIES/RESULTS: Ct Cta Abd/pel W/cm &/or W/o Cm  07/31/2014   CLINICAL DATA:  LEFT upper quadrant abdominal pain.  EXAM: CTA  ABDOMEN AND PELVIS wITHOUT AND WITH CONTRAST  TECHNIQUE: Multidetector CT imaging of the abdomen and pelvis was performed using the standard protocol during bolus administration of intravenous contrast. Multiplanar reconstructed images and MIPs were obtained and reviewed to evaluate the vascular anatomy.  CONTRAST:  19mL OMNIPAQUE IOHEXOL 350 MG/ML SOLN  COMPARISON:  07/16/2013  FINDINGS: Musculoskeletal: Lumbosacral degenerative disease. No aggressive osseous lesions appear  Lung Bases: Normal.  Liver: Mild hepatomegaly with 18.6 cm liver span. Associated rounding of the inferior RIGHT hepatic lobe.  Spleen:  Normal.  Gallbladder:  Normal.  Common bile duct:  Normal.  Pancreas:  Normal.  Adrenal glands:  Normal.  Kidneys:  Normal.  Both ureters appear normal.  Stomach:  Normal.  Small bowel:  Normal.  Colon: Appendix not identified. Diverticulosis without diverticulitis.  Pelvic Genitourinary:  Hysterectomy.  Urinary bladder normal.  Vasculature: No acute vascular abnormality.  Body Wall: LEFT rectus sheath hematoma is present superficial to mesh ventral hernia repair. This measures 44 mm AP, 10 cm transverse and 13 cm craniocaudal. No active extravasation into the hematoma. Midline scarring of the abdominal wall.  Review of the MIP images confirms the above findings.  IMPRESSION: Large LEFT rectus sheath hematoma superficial to mesh ventral hernia repair. No acute intra-abdominal abnormality.   Electronically Signed   By: Dereck Ligas M.D.   On: 07/31/2014 18:30    Oren Binet, MD  [facility] Pager:[phone]  If 7PM-7AM, please contact night-coverage [email or web address] Password [email or web address] 08/02/2014, 11:28 AM   LOS: 2 days   **Disclaimer: This note may have been dictated with voice recognition software. Similar sounding words can inadvertently be transcribed and this note may contain transcription errors which may not have been corrected upon publication of note.**

## 2014-08-02 NOTE — Progress Notes (Signed)
RT placed patient on CPAP 10cmH20, 21% FIO2.

## 2014-08-02 NOTE — Evaluation (Signed)
Physical Therapy Evaluation Patient Details Name: [name] MRN: [id number] DOB: 17-Feb-1956 Today's Date: 08/02/2014   History of Present Illness  Patient is a 58 y/o female with PMH of IBS, hypertension, diabetes type 2, depression, anxiety disorder, TIA, OSA, history of ventral hernia repair, appendectomy, who presents with acute onset of abdominal pain. Pt found to have large LEFT rectus sheath hematoma superficial to mesh ventral hernia repair on CT scan. Surgery consulted and recommendating conservative treatment and pain control .    Clinical Impression  Patient presents with increased pain in abdomen worsened during gait, generalized weakness and symptoms described as "swimmy headed" during gait training limiting safe mobility. Ambulation distance limited due to needing to sit as pt reports "I think I am going down." Pt just given pain medication prior to evaluation. Pt not safe to be home alone at this time due to safety concerns. Pt would benefit from acute PT to improve balance, gait and overall mobility so pt can maximize independence and return to PLOF. Pt needs to negotiate 20 steps to get into apt.     Follow Up Recommendations SNF;Supervision/Assistance - 24 hour    Equipment Recommendations  None recommended by PT    Recommendations for Other Services       Precautions / Restrictions Precautions Precautions: Fall Restrictions Weight Bearing Restrictions: No      Mobility  Bed Mobility Overal bed mobility: Modified Independent             General bed mobility comments: HOB elevated, use of rail.  Transfers Overall transfer level: Needs assistance Equipment used: None Transfers: Sit to/from Stand Sit to Stand: Supervision         General transfer comment: Supervision for safety as pt complains of feeling groggy.  Ambulation/Gait Ambulation/Gait assistance: Min guard Ambulation Distance (Feet): 75 Feet Assistive device: Rolling walker (2  wheeled) Gait Pattern/deviations: Step-through pattern;Decreased stride length Gait velocity: .54 ft/sec Gait velocity interpretation: Below normal speed for age/gender General Gait Details: Pt with very slow, mildly unsteady gait. pt reports feeling "swimmy headed" Leans on rail in hallway and reports , "I think I am going down" Immediately grabbed chair and pt with controlled descent into chair. Pt given dilaudid and oxycodone prior to eval per pt report.  Stairs            Wheelchair Mobility    Modified Rankin (Stroke Patients Only)       Balance Overall balance assessment: Needs assistance   Sitting balance-Leahy Scale: Good       Standing balance-Leahy Scale: Fair Standing balance comment: Requires UE support during dynamic standing due to pain in abdomen and extra support.                              Pertinent Vitals/Pain Pain Assessment: 0-10 Pain Score: 8  Pain Location: left sided abdominal pain Pain Descriptors / Indicators: Sore;Sharp;Aching Pain Intervention(s): Limited activity within patient's tolerance;Premedicated before session;Repositioned;Monitored during session    [place] expects to be discharged to:: Private residence Living Arrangements: Alone   Type of Home: Apartment Home Access: Stairs to enter Entrance Stairs-Rails: Right Entrance Stairs-Number of Steps: 10 +10 Home Layout: One level Home Equipment: Walker - 2 wheels      Prior Function Level of Independence: Independent               Hand Dominance        Extremity/Trunk Assessment  Upper Extremity Assessment: Overall WFL for tasks assessed           Lower Extremity Assessment: Generalized weakness         Communication   Communication: No difficulties  Cognition Arousal/Alertness: Awake/alert Behavior During Therapy: WFL for tasks assessed/performed Overall Cognitive Status: Within Functional Limits for tasks assessed                       General Comments General comments (skin integrity, edema, etc.): BP taken during gait when pt reported feeling lightheaded. 129/71    Exercises        Assessment/Plan    PT Assessment Patient needs continued PT services  PT Diagnosis Generalized weakness;Acute pain   PT Problem List Decreased strength;Pain;Decreased activity tolerance;Decreased balance;Decreased mobility;Decreased knowledge of precautions;Decreased safety awareness;Decreased knowledge of use of DME  PT Treatment Interventions DME instruction;Balance training;Gait training;Functional mobility training;Patient/family education;Therapeutic activities;Therapeutic exercise;Stair training   PT Goals (Current goals can be found in the Care Plan section) Acute Rehab PT Goals Patient Stated Goal: to be able to get better PT Goal Formulation: With patient Time For Goal Achievement: 08/16/14 Potential to Achieve Goals: Good    Frequency Min 3X/week   Barriers to discharge Decreased caregiver support;Inaccessible home environment Pt lives alone and has to climb 20 steps to get to apt.    Co-evaluation               End of Session Equipment Utilized During Treatment: Gait belt Activity Tolerance: Patient limited by pain (Limited due to symptoms of light headedness resulting in need to sit.) Patient left: in bed;with call bell/phone within reach;with bed alarm set Nurse Communication: Mobility status         Time: 6378-5885 PT Time Calculation (min): 26 min   Charges:   PT Evaluation $Initial PT Evaluation Tier I: 1 Procedure PT Treatments $Gait Training: 8-22 mins   PT G CodesCandy Sledge A 08/02/2014, 2:35 PM Candy Sledge, [place], DPT [phone]

## 2014-08-02 NOTE — Care Management Note (Signed)
    Page 1 of 2   08/03/2014     12:29:14 PM CARE MANAGEMENT NOTE 08/03/2014  Patient:  [name], [name]   Account Number:  [id number]  Date Initiated:  08/02/2014  Documentation initiated by:  Tomi Bamberger  Subjective/Objective Assessment:   dx spont rectal sheath hematoma  admit- lives alonle     Action/Plan:   pt eval-rec snf.   Anticipated DC Date:  08/03/2014   Anticipated DC Plan:  SKILLED NURSING FACILITY  In-house referral  Clinical Social Worker      DC Forensic scientist  CM consult      [facility] Choice  HOME HEALTH   Choice offered to / List presented to:  C-1 Patient        [place] arranged  HH-1 RN  [place].   Status of service:  Completed, signed off Medicare Important Message given?  YES (If response is "NO", the following Medicare IM given date fields will be blank) Date Medicare IM given:  08/02/2014 Medicare IM given by:  Tomi Bamberger Date Additional Medicare IM given:   Additional Medicare IM given by:    Discharge Disposition:  [place]  Per UR Regulation:    If discussed at Long Length of Stay Meetings, dates discussed:    Comments:  08/03/14 [place], BSN [phone] patient is for dc today, she chose [facility] for [facility], PT, OT, aide and social worker.  Referral made to [facility], Butch Penny notified.  Soc will begin 24-48 hrs post dc.   Patient has a rolling walker at home.   She stated she does not ned a bsc.

## 2014-08-03 ENCOUNTER — Telehealth: Payer: Self-pay | Admitting: Family

## 2014-08-03 LAB — GLUCOSE, CAPILLARY
Glucose-Capillary: 132 mg/dL — ABNORMAL HIGH (ref 70–99)
Glucose-Capillary: 159 mg/dL — ABNORMAL HIGH (ref 70–99)

## 2014-08-03 MED ORDER — OXYCODONE HCL 5 MG PO TABS: 15.0000 mg | ORAL_TABLET | Freq: Four times a day (QID) | ORAL | Status: DC | PRN

## 2014-08-03 MED ORDER — POLYETHYLENE GLYCOL 3350 17 G PO PACK: 17.0000 g | PACK | Freq: Two times a day (BID) | ORAL | Status: DC

## 2014-08-03 NOTE — Progress Notes (Signed)
SW received message from CM who reported that pt has decided to go home. Pt does not want rehab at a SNF. CM will arrange home health.  Charlene Brooke, MSW Clinical Social Worker [phone]

## 2014-08-03 NOTE — Discharge Summary (Signed)
Physician Discharge Summary  [name] HYW:[id number] DOB: 12-03-1955 DOA: 07/31/2014  PCP: [name], NP  Admit date: 07/31/2014 Discharge date: 08/03/2014  Time spent:45 minutes  Recommendations for Outpatient Follow-up:  1. Follow up with PCP in one week to recheck CBC, and for further monitoring of left rectus sheath hematoma. 2. Will need followup with general surgery for rectus hematoma post hospital discharge, and also for perhaps a repeat CT scan of the abdomen to ensure resolution of the hematoma 3. Follow up with pain clinic for future pain control medications  Discharge Diagnoses:  Principal Problem:   Abdominal pain Active Problems:   Diabetes mellitus, Type 2   DEPRESSION   HYPERTENSION   Irritable bowel syndrome (IBS)   OSA (obstructive sleep apnea)   TIA (transient ischemic attack)   Rectus sheath hematoma   Discharge Condition: stable  Diet recommendation: diabetic diet  Filed Weights   08/01/14 0627 08/02/14 0541 08/03/14 0611  Weight: 111.267 kg (245 lb 4.8 oz) 111.177 kg (245 lb 1.6 oz) 111.6 kg (246 lb 0.5 oz)    History of present illness:  [name] is a 58 y.o. Female with history of hypertension, TIA, and chronic pain syndrome secondary to interstitial cystitis who presented 9/12 with abrupt onset abdominal pain. She was found to have a spontaneous left rectus hematoma on CT. Left sided abdominal pain continues to persist but has improved significantly since admission. Hgb has remained stable throughout hospital stay.  Hospital Course:   Left rectus hematoma  - Patient developed a spontaneous hematoma, was only on aspirin-not on any anticoagulation.  - Seen by general surgery, whose recommendations were for conservative care and abdominal binder. General surgery signed off.Hb remained stable throughout the hospital course - continued with transdermal fentanyl and increase oxycodone to 25-30 mg every 4 hours for breakthrough pain.   -Patient understands that she will likely have ongoing abdominal pain for the next few weeks.PT has seen patient, and plan for discharge with home health services. Initially it was felt she needed to go to a skilled nursing facility, social work was consulted, however patient on the day of discharge wanted to go home with home health services. Patient is agreeable with this plan. Hemoglobin remains stable at 12.0 9/14.   Diabetes mellitus, Type 2  - CBG remained stable through hospital course -utilized sliding scale insulin, resume Januvia and metformin on discharge.   Hypertension  -Controlled with amlodipine, losartan and Lasix.   History of TIA  - Hold Aspirin for the next few days.  - Stable, no deficits on exam   DEPRESSION  - Stable throughout inpatient stay, continue with trazodone,Pristiq and other home medications.   Irritable bowel syndrome (IBS)  - Predominantly diarrhea-claims to be stable- currently at baseline. Continue with Bentyl   OSA (obstructive sleep apnea)  -c/w CPAP QHS   History of chronic pain syndrome  - claims secondary to chronic interstitial cystitis  - On chronic narcotics   Procedures:  None  Consultations:  General Surgery  Discharge Exam: Filed Vitals:   08/03/14 0611  BP: 105/62  Pulse: 67  Temp: 97.8 F (36.6 C)  Resp: 18    General: Well developed, well nourished female in NAD, appears stated age  Cardiovascular: RRR with no rubs, murmurs or gallops auscultated.  Respiratory: Clear to auscultation bilaterally with equal chest rise, no rales, wheezes or rhonchi  Abdomen: Soft, tender to deep palpation in LUQ and LLQ, nondistended.  Neuro: AAOx3, cranial nerves grossly intact.  Discharge Instructions You were cared for by a hospitalist during your hospital stay. If you have any questions about your discharge medications or the care you received while you were in the hospital after you are discharged, you can call the unit and  asked to speak with the hospitalist on call if the hospitalist that took care of you is not available. Once you are discharged, your primary care physician will handle any further medical issues. Please note that NO REFILLS for any discharge medications will be authorized once you are discharged, as it is imperative that you return to your primary care physician (or establish a relationship with a primary care physician if you do not have one) for your aftercare needs so that they can reassess your need for medications and monitor your lab values.  Discharge Instructions   Call MD for:  severe uncontrolled pain    Complete by:  As directed      Diet - low sodium heart healthy    Complete by:  As directed      Increase activity slowly    Complete by:  As directed           Current Discharge Medication List    START taking these medications   Details  polyethylene glycol (MIRALAX / GLYCOLAX) packet Take 17 g by mouth 2 (two) times daily. Qty: 14 each, Refills: 0      CONTINUE these medications which have CHANGED   Details  oxyCODONE (OXY IR/ROXICODONE) 5 MG immediate release tablet Take 3-4 tablets (15-20 mg total) by mouth every 6 (six) hours as needed for breakthrough pain (pain). Qty: 30 tablet, Refills: 0      CONTINUE these medications which have NOT CHANGED   Details  ALPRAZolam (XANAX) 1 MG tablet Take 1 mg by mouth 3 (three) times daily as needed for anxiety.     amLODipine (NORVASC) 10 MG tablet Take 10 mg by mouth at bedtime.    aspirin EC 325 MG tablet Take 325 mg by mouth daily.    desvenlafaxine (PRISTIQ) 50 MG 24 hr tablet Take 100 mg by mouth daily.     Diclofenac Sodium (PENNSAID) 2 % SOLN Place 1 application onto the skin at bedtime as needed (knee pain).    dicyclomine (BENTYL) 20 MG tablet Take 20 mg by mouth 3 (three) times daily as needed for spasms.    esomeprazole (NEXIUM) 40 MG capsule Take 40 mg by mouth at bedtime.    fentaNYL (DURAGESIC) 100 MCG/HR  Place 100 mcg onto the skin every other day.     furosemide (LASIX) 20 MG tablet Take 20 mg by mouth every other day.    lidocaine (LIDODERM) 5 % Place 2 patches onto the skin at bedtime as needed (hip pain).     losartan (COZAAR) 100 MG tablet Take 100 mg by mouth at bedtime.    metFORMIN (GLUCOPHAGE-XR) 500 MG 24 hr tablet Take 2,000 mg by mouth daily with breakfast.    mirabegron ER (MYRBETRIQ) 50 MG TB24 Take 50 mg by mouth at bedtime.     nystatin (MYCOSTATIN/NYSTOP) 100000 UNIT/GM POWD Apply topically daily. Apply small amount under breasts every morning after shower    Omega-3 Fatty Acids (FISH OIL) 1000 MG CAPS Take 1,000 mg by mouth daily.    OVER THE COUNTER MEDICATION Take 650 mg by mouth daily. Plant Sterol Ester    potassium gluconate 595 MG TABS tablet Take 595 mg by mouth daily as needed (leg cramps).  promethazine (PHENERGAN) 25 MG tablet Take 25 mg by mouth daily as needed for nausea or vomiting.     rOPINIRole (REQUIP) 4 MG tablet Take 4 mg by mouth at bedtime.    sitaGLIPtin (JANUVIA) 100 MG tablet Take 100 mg by mouth at bedtime.    tiZANidine (ZANAFLEX) 4 MG tablet Take 4 mg by mouth at bedtime.    topiramate (TOPAMAX) 50 MG tablet Take 50 mg by mouth 2 (two) times daily as needed (headaches).     traZODone (DESYREL) 100 MG tablet Take 100 mg by mouth at bedtime. Take 1 - 2 tablets at bedtime as needed.    glucose blood (ONE TOUCH ULTRA TEST) test strip Use as instructed. Dx: 250.00 Qty: 100 each, Refills: 6    UNABLE TO FIND C-Pap machine , use nightly      STOP taking these medications     fluticasone (FLONASE) 50 MCG/ACT nasal spray        Allergies  Allergen Reactions  . Chlorzoxazone Other (See Comments)    Unknown reaction  . Gabapentin Nausea And Vomiting  . Gemfibrozil Nausea And Vomiting  . Metronidazole Nausea And Vomiting  . Morphine And Related Nausea And Vomiting  . Nitrofurantoin Nausea And Vomiting  . Olmesartan Medoxomil-Hctz  Nausea And Vomiting  . Ondansetron Nausea And Vomiting  . Pentosan Polysulfate Sodium Other (See Comments)    Unknown reaction  . Propoxyphene N-Acetaminophen Nausea And Vomiting  . Ramipril Cough  . Sulfonamide Derivatives Nausea And Vomiting   Follow-up Information   Follow up with O'SULLIVAN,MELISSA S., NP In 1 week.   Specialty:  Internal Medicine   Contact information:   [address] [place] [address] [phone]       Follow up with Edward Jolly, MD. Schedule an appointment as soon as possible for a visit in 2 weeks. (please call office for appointment)    Specialty:  General Surgery   Contact information:   [address] [place] [address] [phone]        The results of significant diagnostics from this hospitalization (including imaging, microbiology, ancillary and laboratory) are listed below for reference.    Significant Diagnostic Studies: Ct Cta Abd/pel W/cm &/or W/o Cm  07/31/2014   CLINICAL DATA:  LEFT upper quadrant abdominal pain.  EXAM: CTA ABDOMEN AND PELVIS wITHOUT AND WITH CONTRAST  TECHNIQUE: Multidetector CT imaging of the abdomen and pelvis was performed using the standard protocol during bolus administration of intravenous contrast. Multiplanar reconstructed images and MIPs were obtained and reviewed to evaluate the vascular anatomy.  CONTRAST:  144mL OMNIPAQUE IOHEXOL 350 MG/ML SOLN  COMPARISON:  07/16/2013  FINDINGS: Musculoskeletal: Lumbosacral degenerative disease. No aggressive osseous lesions appear  Lung Bases: Normal.  Liver: Mild hepatomegaly with 18.6 cm liver span. Associated rounding of the inferior RIGHT hepatic lobe.  Spleen:  Normal.  Gallbladder:  Normal.  Common bile duct:  Normal.  Pancreas:  Normal.  Adrenal glands:  Normal.  Kidneys:  Normal.  Both ureters appear normal.  Stomach:  Normal.  Small bowel:  Normal.  Colon: Appendix not identified. Diverticulosis without diverticulitis.  Pelvic  Genitourinary:  Hysterectomy.  Urinary bladder normal.  Vasculature: No acute vascular abnormality.  Body Wall: LEFT rectus sheath hematoma is present superficial to mesh ventral hernia repair. This measures 44 mm AP, 10 cm transverse and 13 cm craniocaudal. No active extravasation into the hematoma. Midline scarring of the abdominal wall.  Review of the MIP  images confirms the above findings.  IMPRESSION: Large LEFT rectus sheath hematoma superficial to mesh ventral hernia repair. No acute intra-abdominal abnormality.   Electronically Signed   By: Dereck Ligas M.D.   On: 07/31/2014 18:30      Labs: Basic Metabolic Panel:  Recent Labs Lab 07/31/14 1700 07/31/14 1709 08/01/14 0228  NA 137 136* 139  K 3.7 3.4* 4.1  CL 98 101 103  CO2 25  --  25  GLUCOSE 153* 158* 149*  BUN 16 16 12   CREATININE 0.69 0.70 0.72  CALCIUM 9.6  --  8.7   Liver Function Tests:  Recent Labs Lab 07/31/14 1700  AST 18  ALT 20  ALKPHOS 78  BILITOT 0.4  PROT 7.1  ALBUMIN 4.0    Recent Labs Lab 07/31/14 1700  LIPASE 13   CBC:  Recent Labs Lab 07/31/14 1700 07/31/14 1709 07/31/14 2001 08/01/14 0228 08/02/14 0843  WBC 8.0  --  6.9 6.0 5.7  HGB 13.1 14.3 12.5 11.6* 12.0  HCT 38.8 42.0 37.2 34.8* 36.9  MCV 82.4  --  82.9 83.1 84.4  PLT 211  --  193 202 201   CBG:  Recent Labs Lab 08/02/14 0741 08/02/14 1241 08/02/14 1701 08/02/14 2110 08/03/14 0753  GLUCAP 161* 151* 195* 169* 132*   Signed:  Waylan Rocher, PA-S  [facility] 08/03/2014, 10:55 AM  AttendingPatient was seen, examined,treatment plan was discussed with the Physician extender. I have directly reviewed the clinical findings, lab, imaging studies and management of this patient in detail. I have made the necessary changes to the above noted documentation, and agree with the documentation, as recorded by the Physician extender.  Nena Alexander MD [facility].

## 2014-08-03 NOTE — Telephone Encounter (Signed)
Pt discharged from hospital. Needs 1 week follow up please.

## 2014-08-03 NOTE — Telephone Encounter (Signed)
Admit date: 07/31/2014  Discharge date: 08/03/2014  Called patient. She has just been discharged and is on her way home.  Informed patient that I would call her back tomorrow morning once she has settled in at home.  Pt stated understanding and was agreement with me calling her back tomorrow.

## 2014-08-03 NOTE — Progress Notes (Signed)
PROGRESS NOTE  [name] MHD:[id number] DOB: 09-Nov-1956 DOA: 07/31/2014 PCP: [name], NP  HPI/Subjective: [name] is a 58 y.o. Female with history of hypertension, TIA, and chronic pain syndrome secondary to interstitial cystitis who presented 9/12 with abrupt onset abdominal pain. She was found to have a spontaneous left  rectus hematoma on CT. Left sided abdominal pain continues to persist but has improved significantly since admission.   Assessment/Plan: Left rectus hematoma  - Patient developed a spontaneous hematoma, only on aspirin-not on any anticoagulation.  - Seen by general surgery, whose recommendations are for conservative care and abdominal binder. General surgery has signed off  - continue with transdermal fentanyl 100 mcg for 72 hours total, increase oxycodone to 25-30 mg every 4 hours for breakthrough pain.  -Patient has been explained that she will likely have ongoing abdominal pain for the next few weeks.PT will see patient, and plan for discharge either to SNF or home with home health services. Patient is agreeable with this plan. Hemoglobin remains stable at 12.0 9/14.  Diabetes mellitus, Type 2  - CBG is currently stable-continue with sliding scale insulin, resume Januvia and metformin on discharge.   Hypertension  - Currently controlled with amlodipine, losartan and Lasix.   History of TIA  - Hold Aspirin for the next few days. Stable, no deficits on exam   DEPRESSION  - Stable, continue with trazodone,Pristiq and other home medications.   Irritable bowel syndrome (IBS)  - Predominantly diarrhea-claims to be stable- currently at baseline. Continue with Bentyl   OSA (obstructive sleep apnea)  -c/w CPAP QHS   History of chronic pain syndrome  - claims secondary to chronic interstitial cystitis  - On chronic narcotics-please see above    DVT Prophylaxis:  SCD's  Code Status: Full Family Communication: patient alert and  oriented Disposition Plan: home with home health services or SNF  Consultants:  General Surgery  Procedures:  None  Antibiotics: None  Objective: Filed Vitals:   08/02/14 0541 08/02/14 1350 08/02/14 2109 08/03/14 0611  BP: 115/66 116/62 112/65 105/62  Pulse: 63 69 72 67  Temp: 97.6 F (36.4 C) 98.2 F (36.8 C) 98.2 F (36.8 C) 97.8 F (36.6 C)  TempSrc: Oral Oral Oral Oral  Resp: 14 16 18 18   Height:      Weight: 111.177 kg (245 lb 1.6 oz)   111.6 kg (246 lb 0.5 oz)  SpO2: 98% 100% 99% 100%    Intake/Output Summary (Last 24 hours) at 08/03/14 0839 Last data filed at 08/02/14 1815  Gross per 24 hour  Intake    682 ml  Output      0 ml  Net    682 ml   Filed Weights   08/01/14 0627 08/02/14 0541 08/03/14 0611  Weight: 111.267 kg (245 lb 4.8 oz) 111.177 kg (245 lb 1.6 oz) 111.6 kg (246 lb 0.5 oz)    Exam: General: Well developed, well nourished female in NAD, appears stated age  Cardiovascular: RRR with no rubs, murmurs or gallops auscultated.   Respiratory: Clear to auscultation bilaterally with equal chest rise, no rales, wheezes or rhonchi Abdomen: Soft, tender to deep palpation in LUQ and LLQ, nondistended.  Neuro: AAOx3, cranial nerves grossly intact. Psych: Normal affect and demeanor with intact judgement and insight   Data Reviewed: Basic Metabolic Panel:  Recent Labs Lab 07/31/14 1700 07/31/14 1709 08/01/14 0228  NA 137 136* 139  K 3.7 3.4* 4.1  CL 98 101 103  CO2 25  --  25  GLUCOSE 153* 158* 149*  BUN 16 16 12   CREATININE 0.69 0.70 0.72  CALCIUM 9.6  --  8.7   Liver Function Tests:  Recent Labs Lab 07/31/14 1700  AST 18  ALT 20  ALKPHOS 78  BILITOT 0.4  PROT 7.1  ALBUMIN 4.0    Recent Labs Lab 07/31/14 1700  LIPASE 13   No results found for this basename: AMMONIA,  in the last 168 hours CBC:  Recent Labs Lab 07/31/14 1700 07/31/14 1709 07/31/14 2001 08/01/14 0228 08/02/14 0843  WBC 8.0  --  6.9 6.0 5.7  HGB 13.1  14.3 12.5 11.6* 12.0  HCT 38.8 42.0 37.2 34.8* 36.9  MCV 82.4  --  82.9 83.1 84.4  PLT 211  --  193 202 201  CBG:  Recent Labs Lab 08/02/14 0741 08/02/14 1241 08/02/14 1701 08/02/14 2110 08/03/14 0753  GLUCAP 161* 151* 195* 169* 132*     Studies: No results found.  Scheduled Meds: . amLODipine  10 mg Oral Daily  . antiseptic oral rinse  7 mL Mouth Rinse q12n4p  . chlorhexidine  15 mL Mouth Rinse BID  . desvenlafaxine  150 mg Oral Daily  . fentaNYL  100 mcg Transdermal Q72H  . furosemide  20 mg Oral QODAY  . insulin aspart  0-9 Units Subcutaneous TID WC  . losartan  100 mg Oral Daily  . mirabegron ER  50 mg Oral QPM  . pantoprazole  40 mg Oral Daily  . polyethylene glycol  17 g Oral BID  . rOPINIRole  1 mg Oral QHS  . tiZANidine  4 mg Oral QHS  . topiramate  50 mg Oral Q12H   Continuous Infusions:   Principal Problem:   Abdominal pain Active Problems:   Diabetes mellitus, Type 2   DEPRESSION   HYPERTENSION   Irritable bowel syndrome (IBS)   OSA (obstructive sleep apnea)   TIA (transient ischemic attack)   Rectus sheath hematoma  Waylan Rocher, PA-S2   [facility] Pager [phone]. If 7PM-7AM, please contact night-coverage at [email or web address], password [facility] 08/03/2014, 8:39 AM  LOS: 3 days

## 2014-08-03 NOTE — Progress Notes (Signed)
Patient was discharged home by MD order; discharged instructions review and give to patient with care notes and prescriptions; IV DIC; skin intact; patient will be escorted to the car by nurse tech via wheelchair.  

## 2014-08-04 NOTE — Telephone Encounter (Signed)
We can discuss CPAP settings at her follow up appointment.  If she becomes completely unable to eat or drink, she should return to ED.  If no family is available to drive her she can call EMS.  Would she be a candidate for meals on wheels?  If she is willing could you please call to see if they can bring her some meals?

## 2014-08-04 NOTE — Telephone Encounter (Signed)
Yes.  This note is part of the TCM process.  If there is anything you suggest for the patient after reading it that needs to be addressed prior to follow up appointment, please let me know.  Thanks.

## 2014-08-04 NOTE — Telephone Encounter (Signed)
Admit date: 07/31/2014  Discharge date: 08/03/2014  Reason for admission:  Abdominal pain  Recommendations for Outpatient Follow-up:  1. Follow up with PCP in one week to recheck CBC, and for further monitoring of left rectus sheath hematoma. 2. Will need followup with general surgery for rectus hematoma post hospital discharge, and also for perhaps a repeat CT scan of the abdomen to ensure resolution of the hematoma 3. Follow up with pain clinic for future pain control medications  Transition Care Management Follow-up Telephone Call  How have you been since you were released from the hospital? Pt states that she has not been doing well.  She does not feel that she is able to make it at home.  She wishes she would have gone to a skilled nursing facility.  She is in a lot of pain (9/10) even though she's taking her pain medications (Fentanyl patch and Oxycodone).  She has not felt well enough to eat.  She does not have food at home.  Stating that she only has toast and water.  The earliest the daughter who lives in [place] can come by the house is Friday.   She states that [employer] are scheduled to come out and see patient tomorrow.     Do you understand why you were in the hospital? yes   Do you understand the discharge instrcutions? yes  Items Reviewed:  Medications reviewed: yes; pt has not taken any of her medications today with the exception of pain meds.  She has not started Miralax yet, but has medication at home.    Allergies reviewed: yes  Dietary changes reviewed: yes, low sodium hearth healthy.  She was encouraged to have her daughter bring her food to the house and for her to eat and stay hydrated.  She is diabetic and taking pain medication.  Referrals reviewed: yes   Functional Questionnaire:   Activities of Daily Living (ADLs):   She states they are independent in the following: ambulation, feeding, continence, grooming, toileting and dressing States they  require assistance with the following: bathing and hygiene   Any transportation issues/concerns?: yes   Any patient concerns? Yes, CPAP settings need to be adjusted.  "The air is too forceful."   Confirmed importance and date/time of follow-up visits scheduled: yes    Confirmed with patient if condition begins to worsen call PCP or go to the ER.  Patient was given the Call-a-Nurse line [phone]: Yes  Appointment scheduled with [name] on 08/10/14 @ 10:00 am.

## 2014-08-04 NOTE — Telephone Encounter (Signed)
Was this sent to me as an FYI?

## 2014-08-04 NOTE — Telephone Encounter (Signed)
[name]'s note was discussed with patient.  Pt states that she has had 2 pieces of toast and a bottle of water.  Pt understands that if her symptoms worsen or she becomes weak, she is to call EMS and go back to the hospital.  Called to see if patient would be a candidate for meals on wheels as well as Mobile Meals and due to pt's age and current status (they do not do emergency deliveries), they are unable to provide services to her.

## 2014-08-05 ENCOUNTER — Telehealth: Payer: Self-pay

## 2014-08-05 NOTE — Telephone Encounter (Signed)
Fredna Dow Physical Therapist [phone]  Merry Proud called to let us know his company was going to fax an [facility] for Ms Dant. She was in the [facility] hospital 07-31-14 till 08-03-14 with a blood clot that burst, she went home because she thought she would be able to handle it, she has now realized that she needs to go to a facility for a little while to recuperate. She would like to go to [facility] tomorrow if at all possible.

## 2014-08-06 NOTE — Telephone Encounter (Signed)
FL2 form forwarded to Provider for completion.

## 2014-08-06 NOTE — Telephone Encounter (Signed)
Pt called requesting that FL2 form be completed as soon as possible.  Pt stated that [facility] has a bed for her.  Forms placed in [place] O'Sullivan's folder for completion and signature.

## 2014-08-07 NOTE — Telephone Encounter (Signed)
Spoke to patient. She reports that she is using a walker to ambulate, feels very weak.  Today she has loose stool and had episode of fecal incontinence- but attributes that to taking miralax daily due to constipation from pain meds.   Sugar about 167, 116 on another reading, hasn't checked today.  She reports that she is unable to to come to the office this week for visit due to lack of transportation.  Daughter stopped by today and brought her cereal and grits, but she is having trouble preparing the food due to weakness.  I advised pt to contact her daughter to bring her to [facility] for further evaluation- and ultimately she will need SNF placement.  If her daughter is unable to bring her to the hospital then I advised that she contact 911 to bring her to the ED.  She verbalizes understanding.

## 2014-08-07 NOTE — Telephone Encounter (Signed)
Attempted to contact pt to see how she is doing. No answer.

## 2014-08-08 ENCOUNTER — Observation Stay (HOSPITAL_COMMUNITY)
Admission: EM | Admit: 2014-08-08 | Discharge: 2014-08-09 | Disposition: A | Discharge status: Skilled Nursing Facility | Payer: PRIVATE HEALTH INSURANCE | Attending: Family Medicine | Admitting: Family Medicine

## 2014-08-08 ENCOUNTER — Encounter (HOSPITAL_COMMUNITY): Payer: Self-pay | Admitting: Emergency Medicine

## 2014-08-08 DIAGNOSIS — J309 Allergic rhinitis, unspecified: Secondary | ICD-10-CM

## 2014-08-08 DIAGNOSIS — K30 Functional dyspepsia: Secondary | ICD-10-CM

## 2014-08-08 DIAGNOSIS — E559 Vitamin D deficiency, unspecified: Secondary | ICD-10-CM

## 2014-08-08 DIAGNOSIS — K219 Gastro-esophageal reflux disease without esophagitis: Secondary | ICD-10-CM | POA: Diagnosis present

## 2014-08-08 DIAGNOSIS — E785 Hyperlipidemia, unspecified: Secondary | ICD-10-CM | POA: Insufficient documentation

## 2014-08-08 DIAGNOSIS — D500 Iron deficiency anemia secondary to blood loss (chronic): Secondary | ICD-10-CM

## 2014-08-08 DIAGNOSIS — G894 Chronic pain syndrome: Secondary | ICD-10-CM | POA: Diagnosis present

## 2014-08-08 DIAGNOSIS — Z79899 Other long term (current) drug therapy: Secondary | ICD-10-CM | POA: Diagnosis not present

## 2014-08-08 DIAGNOSIS — E669 Obesity, unspecified: Secondary | ICD-10-CM | POA: Diagnosis not present

## 2014-08-08 DIAGNOSIS — Z87891 Personal history of nicotine dependence: Secondary | ICD-10-CM | POA: Insufficient documentation

## 2014-08-08 DIAGNOSIS — N3010 Interstitial cystitis (chronic) without hematuria: Secondary | ICD-10-CM

## 2014-08-08 DIAGNOSIS — G8929 Other chronic pain: Secondary | ICD-10-CM | POA: Diagnosis not present

## 2014-08-08 DIAGNOSIS — M899 Disorder of bone, unspecified: Secondary | ICD-10-CM | POA: Diagnosis not present

## 2014-08-08 DIAGNOSIS — Z8673 Personal history of transient ischemic attack (TIA), and cerebral infarction without residual deficits: Secondary | ICD-10-CM | POA: Insufficient documentation

## 2014-08-08 DIAGNOSIS — G459 Transient cerebral ischemic attack, unspecified: Secondary | ICD-10-CM | POA: Diagnosis present

## 2014-08-08 DIAGNOSIS — S3981XA Other specified injuries of abdomen, initial encounter: Secondary | ICD-10-CM | POA: Diagnosis not present

## 2014-08-08 DIAGNOSIS — G4733 Obstructive sleep apnea (adult) (pediatric): Secondary | ICD-10-CM | POA: Diagnosis present

## 2014-08-08 DIAGNOSIS — IMO0002 Reserved for concepts with insufficient information to code with codable children: Secondary | ICD-10-CM

## 2014-08-08 DIAGNOSIS — E1169 Type 2 diabetes mellitus with other specified complication: Secondary | ICD-10-CM | POA: Diagnosis present

## 2014-08-08 DIAGNOSIS — IMO0001 Reserved for inherently not codable concepts without codable children: Secondary | ICD-10-CM | POA: Insufficient documentation

## 2014-08-08 DIAGNOSIS — I10 Essential (primary) hypertension: Secondary | ICD-10-CM | POA: Diagnosis not present

## 2014-08-08 DIAGNOSIS — G2581 Restless legs syndrome: Secondary | ICD-10-CM | POA: Diagnosis not present

## 2014-08-08 DIAGNOSIS — R232 Flushing: Secondary | ICD-10-CM

## 2014-08-08 DIAGNOSIS — R61 Generalized hyperhidrosis: Secondary | ICD-10-CM

## 2014-08-08 DIAGNOSIS — F411 Generalized anxiety disorder: Secondary | ICD-10-CM | POA: Insufficient documentation

## 2014-08-08 DIAGNOSIS — S301XXA Contusion of abdominal wall, initial encounter: Secondary | ICD-10-CM | POA: Diagnosis present

## 2014-08-08 DIAGNOSIS — E119 Type 2 diabetes mellitus without complications: Secondary | ICD-10-CM | POA: Diagnosis not present

## 2014-08-08 DIAGNOSIS — F329 Major depressive disorder, single episode, unspecified: Secondary | ICD-10-CM | POA: Diagnosis not present

## 2014-08-08 DIAGNOSIS — M949 Disorder of cartilage, unspecified: Secondary | ICD-10-CM

## 2014-08-08 DIAGNOSIS — R5383 Other fatigue: Secondary | ICD-10-CM | POA: Diagnosis present

## 2014-08-08 DIAGNOSIS — R531 Weakness: Secondary | ICD-10-CM | POA: Diagnosis present

## 2014-08-08 DIAGNOSIS — M1710 Unilateral primary osteoarthritis, unspecified knee: Secondary | ICD-10-CM | POA: Diagnosis not present

## 2014-08-08 DIAGNOSIS — K589 Irritable bowel syndrome without diarrhea: Secondary | ICD-10-CM | POA: Insufficient documentation

## 2014-08-08 DIAGNOSIS — F3289 Other specified depressive episodes: Secondary | ICD-10-CM

## 2014-08-08 DIAGNOSIS — R5381 Other malaise: Secondary | ICD-10-CM | POA: Insufficient documentation

## 2014-08-08 DIAGNOSIS — Z5189 Encounter for other specified aftercare: Secondary | ICD-10-CM

## 2014-08-08 DIAGNOSIS — R252 Cramp and spasm: Secondary | ICD-10-CM

## 2014-08-08 DIAGNOSIS — R109 Unspecified abdominal pain: Secondary | ICD-10-CM | POA: Diagnosis present

## 2014-08-08 DIAGNOSIS — R509 Fever, unspecified: Secondary | ICD-10-CM

## 2014-08-08 DIAGNOSIS — M129 Arthropathy, unspecified: Secondary | ICD-10-CM

## 2014-08-08 DIAGNOSIS — X58XXXA Exposure to other specified factors, initial encounter: Secondary | ICD-10-CM | POA: Diagnosis not present

## 2014-08-08 DIAGNOSIS — Z87442 Personal history of urinary calculi: Secondary | ICD-10-CM

## 2014-08-08 DIAGNOSIS — L304 Erythema intertrigo: Secondary | ICD-10-CM

## 2014-08-08 DIAGNOSIS — K2100 Gastro-esophageal reflux disease with esophagitis, without bleeding: Secondary | ICD-10-CM

## 2014-08-08 DIAGNOSIS — S301XXD Contusion of abdominal wall, subsequent encounter: Secondary | ICD-10-CM

## 2014-08-08 LAB — CBC WITH DIFFERENTIAL/PLATELET
Basophils Absolute: 0 10*3/uL (ref 0.0–0.1)
Basophils Relative: 0 % (ref 0–1)
EOS PCT: 2 % (ref 0–5)
Eosinophils Absolute: 0.1 10*3/uL (ref 0.0–0.7)
HEMATOCRIT: 35.3 % — AB (ref 36.0–46.0)
Hemoglobin: 11.9 g/dL — ABNORMAL LOW (ref 12.0–15.0)
Lymphocytes Relative: 25 % (ref 12–46)
Lymphs Abs: 1.3 10*3/uL (ref 0.7–4.0)
MCH: 28.1 pg (ref 26.0–34.0)
MCHC: 33.7 g/dL (ref 30.0–36.0)
MCV: 83.3 fL (ref 78.0–100.0)
Monocytes Absolute: 0.4 10*3/uL (ref 0.1–1.0)
Monocytes Relative: 7 % (ref 3–12)
Neutro Abs: 3.5 10*3/uL (ref 1.7–7.7)
Neutrophils Relative %: 66 % (ref 43–77)
PLATELETS: 212 10*3/uL (ref 150–400)
RBC: 4.24 MIL/uL (ref 3.87–5.11)
RDW: 13.1 % (ref 11.5–15.5)
WBC: 5.2 10*3/uL (ref 4.0–10.5)

## 2014-08-08 LAB — GLUCOSE, CAPILLARY
GLUCOSE-CAPILLARY: 86 mg/dL (ref 70–99)
Glucose-Capillary: 102 mg/dL — ABNORMAL HIGH (ref 70–99)

## 2014-08-08 LAB — COMPREHENSIVE METABOLIC PANEL
ALK PHOS: 77 U/L (ref 39–117)
ALT: 19 U/L (ref 0–35)
AST: 19 U/L (ref 0–37)
Albumin: 3.5 g/dL (ref 3.5–5.2)
Anion gap: 10 (ref 5–15)
BUN: 14 mg/dL (ref 6–23)
CALCIUM: 9.2 mg/dL (ref 8.4–10.5)
CO2: 26 mEq/L (ref 19–32)
Chloride: 99 mEq/L (ref 96–112)
Creatinine, Ser: 0.72 mg/dL (ref 0.50–1.10)
GFR calc Af Amer: >90 mL/min (ref >90)
GLUCOSE: 116 mg/dL — AB (ref 70–99)
Potassium: 4.5 mEq/L (ref 3.7–5.3)
Sodium: 135 mEq/L — ABNORMAL LOW (ref 137–147)
Total Bilirubin: 0.5 mg/dL (ref 0.3–1.2)
Total Protein: 6.5 g/dL (ref 6.0–8.3)

## 2014-08-08 LAB — LIPASE, BLOOD: Lipase: 12 U/L (ref 11–59)

## 2014-08-08 MED ORDER — ACETAMINOPHEN 325 MG PO TABS: 650.0000 mg | ORAL_TABLET | Freq: Four times a day (QID) | ORAL | Status: DC | PRN

## 2014-08-08 MED ORDER — ALBUTEROL SULFATE (2.5 MG/3ML) 0.083% IN NEBU: 2.5000 mg | INHALATION_SOLUTION | RESPIRATORY_TRACT | Status: DC | PRN

## 2014-08-08 MED ORDER — OMEGA-3-ACID ETHYL ESTERS 1 G PO CAPS
1.0000 g | ORAL_CAPSULE | Freq: Two times a day (BID) | ORAL | Status: DC
  Administered 2014-08-09: 1 g via ORAL
  Filled 2014-08-08 (×3): qty 1

## 2014-08-08 MED ORDER — ALUM & MAG HYDROXIDE-SIMETH 200-200-20 MG/5ML PO SUSP: 30.0000 mL | Freq: Four times a day (QID) | ORAL | Status: DC | PRN

## 2014-08-08 MED ORDER — FUROSEMIDE 20 MG PO TABS
20.0000 mg | ORAL_TABLET | ORAL | Status: DC
  Administered 2014-08-09: 20 mg via ORAL
  Filled 2014-08-08: qty 1

## 2014-08-08 MED ORDER — PANTOPRAZOLE SODIUM 40 MG PO TBEC
40.0000 mg | DELAYED_RELEASE_TABLET | Freq: Every day | ORAL | Status: DC
  Administered 2014-08-08: 40 mg via ORAL
  Filled 2014-08-08: qty 1

## 2014-08-08 MED ORDER — PROMETHAZINE HCL 25 MG PO TABS: 25.0000 mg | ORAL_TABLET | Freq: Every day | ORAL | Status: DC | PRN

## 2014-08-08 MED ORDER — LINAGLIPTIN 5 MG PO TABS
5.0000 mg | ORAL_TABLET | Freq: Every day | ORAL | Status: DC
  Administered 2014-08-09: 5 mg via ORAL
  Filled 2014-08-08: qty 1

## 2014-08-08 MED ORDER — POLYETHYLENE GLYCOL 3350 17 G PO PACK
17.0000 g | PACK | Freq: Two times a day (BID) | ORAL | Status: DC
  Filled 2014-08-08 (×2): qty 1

## 2014-08-08 MED ORDER — ACETAMINOPHEN 650 MG RE SUPP: 650.0000 mg | Freq: Four times a day (QID) | RECTAL | Status: DC | PRN

## 2014-08-08 MED ORDER — ROPINIROLE HCL 1 MG PO TABS
4.0000 mg | ORAL_TABLET | Freq: Every day | ORAL | Status: DC
  Administered 2014-08-08: 4 mg via ORAL
  Filled 2014-08-08 (×2): qty 4

## 2014-08-08 MED ORDER — HYDROMORPHONE HCL 1 MG/ML IJ SOLN
1.0000 mg | Freq: Once | INTRAMUSCULAR | Status: AC
  Administered 2014-08-08: 1 mg via INTRAVENOUS
  Filled 2014-08-08: qty 1

## 2014-08-08 MED ORDER — SORBITOL 70 % SOLN
30.0000 mL | Freq: Every day | Status: DC | PRN
Start: 2014-08-08 — End: 2014-08-09
  Filled 2014-08-08: qty 30

## 2014-08-08 MED ORDER — TIZANIDINE HCL 4 MG PO TABS
4.0000 mg | ORAL_TABLET | Freq: Every day | ORAL | Status: DC
  Administered 2014-08-08: 4 mg via ORAL
  Filled 2014-08-08 (×2): qty 1

## 2014-08-08 MED ORDER — ONDANSETRON HCL 4 MG/2ML IJ SOLN: 4.0000 mg | Freq: Four times a day (QID) | INTRAMUSCULAR | Status: DC | PRN

## 2014-08-08 MED ORDER — DESVENLAFAXINE SUCCINATE ER 100 MG PO TB24
100.0000 mg | ORAL_TABLET | Freq: Every day | ORAL | Status: DC
  Administered 2014-08-09: 100 mg via ORAL
  Filled 2014-08-08: qty 1

## 2014-08-08 MED ORDER — HYDROMORPHONE HCL 1 MG/ML IJ SOLN
1.0000 mg | INTRAMUSCULAR | Status: DC | PRN
  Administered 2014-08-08 – 2014-08-09 (×4): 1 mg via INTRAVENOUS
  Filled 2014-08-08 (×4): qty 1

## 2014-08-08 MED ORDER — LOSARTAN POTASSIUM 50 MG PO TABS
100.0000 mg | ORAL_TABLET | Freq: Every day | ORAL | Status: DC
  Administered 2014-08-08: 100 mg via ORAL
  Filled 2014-08-08 (×2): qty 2

## 2014-08-08 MED ORDER — DICLOFENAC SODIUM 1 % TD GEL: 2.0000 g | Freq: Every evening | TRANSDERMAL | Status: DC | PRN

## 2014-08-08 MED ORDER — LIDOCAINE 5 % EX PTCH
2.0000 | MEDICATED_PATCH | Freq: Every evening | CUTANEOUS | Status: DC | PRN
  Filled 2014-08-08: qty 2

## 2014-08-08 MED ORDER — FENTANYL 100 MCG/HR TD PT72: 100.0000 ug | MEDICATED_PATCH | TRANSDERMAL | Status: DC

## 2014-08-08 MED ORDER — TOPIRAMATE 25 MG PO TABS
50.0000 mg | ORAL_TABLET | Freq: Two times a day (BID) | ORAL | Status: DC | PRN
  Filled 2014-08-08: qty 2

## 2014-08-08 MED ORDER — POTASSIUM GLUCONATE 595 (99 K) MG PO TABS: 595.0000 mg | ORAL_TABLET | Freq: Every day | ORAL | Status: DC | PRN

## 2014-08-08 MED ORDER — ONDANSETRON HCL 4 MG PO TABS: 4.0000 mg | ORAL_TABLET | Freq: Four times a day (QID) | ORAL | Status: DC | PRN

## 2014-08-08 MED ORDER — DICYCLOMINE HCL 20 MG PO TABS
20.0000 mg | ORAL_TABLET | Freq: Three times a day (TID) | ORAL | Status: DC | PRN
  Filled 2014-08-08: qty 1

## 2014-08-08 MED ORDER — MIRABEGRON ER 50 MG PO TB24
50.0000 mg | ORAL_TABLET | Freq: Every day | ORAL | Status: DC
  Administered 2014-08-08: 50 mg via ORAL
  Filled 2014-08-08 (×2): qty 1

## 2014-08-08 MED ORDER — AMLODIPINE BESYLATE 10 MG PO TABS
10.0000 mg | ORAL_TABLET | Freq: Every day | ORAL | Status: DC
  Administered 2014-08-08: 10 mg via ORAL
  Filled 2014-08-08 (×2): qty 1

## 2014-08-08 MED ORDER — TRAZODONE HCL 100 MG PO TABS
100.0000 mg | ORAL_TABLET | Freq: Every day | ORAL | Status: DC
  Administered 2014-08-08: 100 mg via ORAL
  Filled 2014-08-08 (×2): qty 1

## 2014-08-08 MED ORDER — DICLOFENAC SODIUM 2 % TD SOLN
1.0000 | Freq: Every evening | TRANSDERMAL | Status: DC | PRN
Start: 2014-08-08 — End: 2014-08-08

## 2014-08-08 MED ORDER — ASPIRIN EC 325 MG PO TBEC
325.0000 mg | DELAYED_RELEASE_TABLET | Freq: Every day | ORAL | Status: DC
  Administered 2014-08-09: 325 mg via ORAL
  Filled 2014-08-08 (×2): qty 1

## 2014-08-08 MED ORDER — ALPRAZOLAM 0.5 MG PO TABS: 1.0000 mg | ORAL_TABLET | Freq: Three times a day (TID) | ORAL | Status: DC | PRN

## 2014-08-08 MED ORDER — OXYCODONE HCL 5 MG PO TABS
5.0000 mg | ORAL_TABLET | ORAL | Status: DC | PRN
  Administered 2014-08-09: 5 mg via ORAL
  Filled 2014-08-08: qty 1

## 2014-08-08 MED ORDER — INSULIN ASPART 100 UNIT/ML ~~LOC~~ SOLN
0.0000 [IU] | Freq: Three times a day (TID) | SUBCUTANEOUS | Status: DC
  Administered 2014-08-09: 3 [IU] via SUBCUTANEOUS

## 2014-08-08 MED ORDER — NYSTATIN 100000 UNIT/GM EX POWD
Freq: Every day | CUTANEOUS | Status: DC
  Administered 2014-08-09: 10:00:00 via TOPICAL
  Filled 2014-08-08: qty 15

## 2014-08-08 NOTE — H&P (Signed)
Patient Demographics  [name], is a 58 y.o. female  MRN: [id number]   DOB - 05/13/1956  Admit Date - 08/08/2014  Outpatient Primary MD for the patient is [name], NP   With History of -  Past Medical History  Diagnosis Date  . Diverticulosis   . Restless leg syndrome   . IBS (irritable bowel syndrome)   . IC (interstitial cystitis)   . Hypertension   . Diabetes mellitus     diet controlled  . Depression   . Fibromyalgia   . Anxiety   . Osteopenia   . Arthritis     in knees  . Dyslipidemia   . Chronic fatigue syndrome   . Chronic abdominal pain   . Esophageal reflux   . TIA (transient ischemic attack) 08/22/2012  . Obesity   . Sleep apnea       Past Surgical History  Procedure Laterality Date  . Total knee arthroplasty      bilateral  . Tonsillectomy and adenoidectomy    . Breast cyst excision  1978    left  . Laparotomy  1982    cyst removed from right ovary and removal of adhesions  . Abdominal exploration surgery  1983    diverticulum removed  . Appendectomy  1983    with exploration  . Laparoscopic incisional / umbilical / ventral hernia repair  1991, 2001, 2007    x 3  . Vaginal hysterectomy  1992  . Laparotomy  1991  . Scalp surgery  1993, 1998    Removal of tumor  . Nasal sinus surgery  2004  . Rectocele repair  2009  . Cervical fusion  2010  . Bladder hydrodistention      in for   Chief Complaint  Patient presents with  . Abdominal Pain  . Fatigue     HPI  [name]  is a 58 y.o. female, who was recently discharged from [facility] for spontaneous hematoma in the abdomen a rectus sheath, patient was on aspirin, evaluated by surgical service then, her hemoglobin has stabilized, patient has been complaining of significant abdominal pain pain, or she was discharged on large dose by mouth oxycodone as needed, patient was supposed to be discharged to skilled nursing facility, but she decided to be discharged  home so she can take care of her cat, but patient reports since then she has not been able to take care of herself, not able to cook, as well she has not been able to take care of her hands, and she has not been eating, and has been feeling so weak, she contacted her primary care office who instructed her to come to the hospital for admission, and placement, and reports her abdominal pain has been at baseline no change, there is no worsening of her bruising, her hemoglobin has been stable since discharge, patella service requested to admit the patient. She denies any chest pain, shortness of breath, hemoptysis, dizziness or lightheadedness.    Review of Systems    In addition to the HPI above,  No Fever-chills, No Headache, No changes with Vision or hearing, No problems swallowing food or Liquids, No Chest pain, Cough or Shortness of Breath, Complaints of abdominal pain secondary to hematoma which has been stable, No Nausea or Vommitting, Bowel movements are regular, No Blood in stool or Urine, No dysuria, No new skin rashes or bruises, No new joints pains-aches,  No new weakness, tingling, numbness in any extremity,  complaining of generalized weakness. No recent weight gain or loss, No polyuria, polydypsia or polyphagia, No significant Mental Stressors.  A full 10 point Review of Systems was done, except as stated above, all other Review of Systems were negative.   Social History History  Substance Use Topics  . Smoking status: Former Smoker -- 0.10 packs/day for 1 years    Types: Cigarettes    Quit date: 10/19/2012  . Smokeless tobacco: Never Used     Comment: using electronic cigarettes  . Alcohol Use: Yes     Comment: 2 drinks per month     Family History Family History  Problem Relation Age of Onset  . Breast cancer Mother   . Dementia Mother   . Breast cancer Maternal Grandmother   . Throat cancer Maternal Grandmother   . Esophageal cancer Maternal Grandmother   .  Breast cancer Other     maternal great aunts x 5  . Rheum arthritis Other   . Colon cancer Neg Hx   . Diabetes Father   . Dementia Father   . Diabetes Maternal Grandfather      Prior to Admission medications   Medication Sig Start Date End Date Taking? Authorizing Provider  ALPRAZolam Duanne Moron) 1 MG tablet Take 1 mg by mouth 3 (three) times daily as needed for anxiety.     Historical Provider, MD  amLODipine (NORVASC) 10 MG tablet Take 10 mg by mouth at bedtime.    Historical Provider, MD  aspirin EC 325 MG tablet Take 325 mg by mouth daily.    Historical Provider, MD  desvenlafaxine (PRISTIQ) 50 MG 24 hr tablet Take 100 mg by mouth daily.     Historical Provider, MD  Diclofenac Sodium (PENNSAID) 2 % SOLN Place 1 application onto the skin at bedtime as needed (knee pain).    Historical Provider, MD  dicyclomine (BENTYL) 20 MG tablet Take 20 mg by mouth 3 (three) times daily as needed for spasms.    Historical Provider, MD  esomeprazole (NEXIUM) 40 MG capsule Take 40 mg by mouth at bedtime.    Historical Provider, MD  fentaNYL (DURAGESIC) 100 MCG/HR Place 100 mcg onto the skin every other day.     Historical Provider, MD  furosemide (LASIX) 20 MG tablet Take 20 mg by mouth every other day.    Historical Provider, MD  glucose blood (ONE TOUCH ULTRA TEST) test strip Use as instructed. Dx: 250.00 07/17/13   [name], NP  lidocaine (LIDODERM) 5 % Place 2 patches onto the skin at bedtime as needed (hip pain).  03/26/13   Historical Provider, MD  losartan (COZAAR) 100 MG tablet Take 100 mg by mouth at bedtime.    Historical Provider, MD  metFORMIN (GLUCOPHAGE-XR) 500 MG 24 hr tablet Take 2,000 mg by mouth daily with breakfast.    Historical Provider, MD  mirabegron ER (MYRBETRIQ) 50 MG TB24 Take 50 mg by mouth at bedtime.     Historical Provider, MD  nystatin (MYCOSTATIN/NYSTOP) 100000 UNIT/GM POWD Apply topically daily. Apply small amount under breasts every morning after shower     Historical Provider, MD  Omega-3 Fatty Acids (FISH OIL) 1000 MG CAPS Take 1,000 mg by mouth daily.    Historical Provider, MD  OVER THE COUNTER MEDICATION Take 650 mg by mouth daily. Plant Sterol Ester    Historical Provider, MD  oxyCODONE (OXY IR/ROXICODONE) 5 MG immediate release tablet Take 3-4 tablets (15-20 mg total) by mouth every 6 (six) hours as needed for breakthrough  pain (pain). 08/03/14   Shanker Kristeen Mans, MD  polyethylene glycol (MIRALAX / GLYCOLAX) packet Take 17 g by mouth 2 (two) times daily. 08/03/14   Shanker Kristeen Mans, MD  potassium gluconate 595 MG TABS tablet Take 595 mg by mouth daily as needed (leg cramps).    Historical Provider, MD  promethazine (PHENERGAN) 25 MG tablet Take 25 mg by mouth daily as needed for nausea or vomiting.  08/03/11   Rowe Clack, MD  rOPINIRole (REQUIP) 4 MG tablet Take 4 mg by mouth at bedtime.    Historical Provider, MD  sitaGLIPtin (JANUVIA) 100 MG tablet Take 100 mg by mouth at bedtime.    Historical Provider, MD  tiZANidine (ZANAFLEX) 4 MG tablet Take 4 mg by mouth at bedtime. 01/14/13   Historical Provider, MD  topiramate (TOPAMAX) 50 MG tablet Take 50 mg by mouth 2 (two) times daily as needed (headaches).  03/31/14   [name], NP  traZODone (DESYREL) 100 MG tablet Take 100 mg by mouth at bedtime. Take 1 - 2 tablets at bedtime as needed.    Historical Provider, MD  UNABLE TO FIND C-Pap machine , use nightly    Historical Provider, MD    Allergies  Allergen Reactions  . Chlorzoxazone Other (See Comments)    Unknown reaction  . Gabapentin Nausea And Vomiting  . Gemfibrozil Nausea And Vomiting  . Metronidazole Nausea And Vomiting  . Morphine And Related Nausea And Vomiting  . Nitrofurantoin Nausea And Vomiting  . Olmesartan Medoxomil-Hctz Nausea And Vomiting  . Ondansetron Nausea And Vomiting  . Pentosan Polysulfate Sodium Other (See Comments)    Unknown reaction  . Propoxyphene N-Acetaminophen Nausea And Vomiting  .  Ramipril Cough  . Sulfonamide Derivatives Nausea And Vomiting    Physical Exam  Vitals  Blood pressure 119/60, pulse 74, temperature 98.3 F (36.8 C), temperature source Oral, resp. rate 18, weight 111.585 kg (246 lb), SpO2 100.00%.   1. General obese female lying in bed in NAD,   2. Normal affect and insight, Not Suicidal or Homicidal, Awake Alert, Oriented X 3.  3. No F.N deficits, ALL C.Nerves Intact, Strength 5/5 all 4 extremities, Sensation intact all 4 extremities, Plantars down going.  4. Ears and Eyes appear Normal, Conjunctivae clear, PERRLA. Moist Oral Mucosa.  5. Supple Neck, No JVD, No cervical lymphadenopathy appriciated, No Carotid Bruits.  6. Symmetrical Chest wall movement, Good air movement bilaterally, CTAB.  7. RRR, No Gallops, Rubs or Murmurs, No Parasternal Heave.  8. Positive Bowel Sounds, , No tenderness, No organomegaly appriciated,No rebound -guarding or rigidity. Has tenderness to palpation in the left abdominal area, with ecchymosis in the left anterior abdomen and left flank area, patient reports it has been stable.  9.  No Cyanosis, Normal Skin Turgor, No Skin Rash or Bruise.  10. Good muscle tone,  joints appear normal , no effusions, Normal ROM.  11. No Palpable Lymph Nodes in Neck or Axillae    Data Review  CBC  Recent Labs Lab 08/02/14 0843 08/08/14 1530  WBC 5.7 5.2  HGB 12.0 11.9*  HCT 36.9 35.3*  PLT 201 212  MCV 84.4 83.3  MCH 27.5 28.1  MCHC 32.5 33.7  RDW 13.2 13.1  LYMPHSABS  --  1.3  MONOABS  --  0.4  EOSABS  --  0.1  BASOSABS  --  0.0   ------------------------------------------------------------------------------------------------------------------  Chemistries   Recent Labs Lab 08/08/14 1530  NA 135*  K 4.5  CL 99  CO2  26  GLUCOSE 116*  BUN 14  CREATININE 0.72  CALCIUM 9.2  AST 19  ALT 19  ALKPHOS 77  BILITOT 0.5    ------------------------------------------------------------------------------------------------------------------ CrCl is unknown because both a height and weight (above a minimum accepted value) are required for this calculation. ------------------------------------------------------------------------------------------------------------------ No results found for this basename: TSH, T4TOTAL, FREET3, T3FREE, THYROIDAB,  in the last 72 hours   Coagulation profile No results found for this basename: INR, PROTIME,  in the last 168 hours ------------------------------------------------------------------------------------------------------------------- No results found for this basename: DDIMER,  in the last 72 hours -------------------------------------------------------------------------------------------------------------------  Cardiac Enzymes No results found for this basename: CK, CKMB, TROPONINI, MYOGLOBIN,  in the last 168 hours ------------------------------------------------------------------------------------------------------------------ No components found with this basename: POCBNP,    ---------------------------------------------------------------------------------------------------------------  Urinalysis    Component Value Date/Time   COLORURINE YELLOW 02/09/2014 1342   APPEARANCEUR TURBID* 02/09/2014 1342   LABSPEC 1.024 02/09/2014 1342   PHURINE 5.5 02/09/2014 1342   GLUCOSEU > 1000* 02/09/2014 1342   HGBUR NEG 02/09/2014 1342   BILIRUBINUR NEG 02/09/2014 1342   BILIRUBINUR negative 04/28/2013 1109   KETONESUR NEG 02/09/2014 1342   PROTEINUR NEG 02/09/2014 1342   PROTEINUR 30 04/28/2013 1109   UROBILINOGEN 0.2 02/09/2014 1342   UROBILINOGEN 0.2 04/28/2013 1109   NITRITE NEG 02/09/2014 1342   NITRITE positive 04/28/2013 1109   LEUKOCYTESUR NEG 02/09/2014 1342     ----------------------------------------------------------------------------------------------------------------  Imaging results:   No results found.     Assessment & Plan  Principal Problem:   Weakness Active Problems:   Abdominal pain   Rectus sheath hematoma   Diabetes mellitus, Type 2   DYSLIPIDEMIA   RESTLESS LEG SYNDROME   Chronic pain syndrome   HYPERTENSION   GERD   OSA (obstructive sleep apnea)   TIA (transient ischemic attack)    1. weakness and debilitation: Given patient's recent hospitalization, and abdominal pain hematoma with significant pain, patient cannot take care of herself, cannot ambulate, cannot cook for herself, was sent by her PCP for admission and placement, patient already seen by social workers during last admission and had a bed in a nursing facility but she chose not to go to, so she will be admitted to observation status, so we'll  reconsult social work for placement.  2. Abdominal pain/abdominal wall hematoma: Seems to be stated, hemoglobin is stable, will keep her on when necessary IV dilaudid as inpatient, and when she is in thefor discharge can be switched back to her when necessary oxycodone, we will recheck CBC in a.m. hold chemical anticoagulation.  3. diabetes mellitus: We'll hold metformin (in case she will need imaging study with IV contrast), and place on insulin sliding scale while inpatient , she can be resumed back on metformin upon upon discharge.  4. Obstructive sleep apnea: Continue with CPAP  5. GERD:  Continue with PPI  6. TIA: Continue with aspirin  7. Dyslipidemia: Continue with fish oil  8. Hypertension: Continue with home medication  9. Chronic pain syndrome: Continue with fentanyl patch,  10. Restless leg syndrome: Daily with the Requip  DVT Prophylaxis  SCDs   AM Labs Ordered, also please review Full Orders  Family Communication: Admission, patients condition and plan of care including tests  being ordered have been discussed with the patient  who indicate understanding and agree with the plan and Code Status.  Code Status  full  Likely DC to  SNF in 24 hours  Condition GUARDED    Time spent in minutes : 55 minutes  [facility], DAWOOD M.D on 08/08/2014 at 5:39 PM  Between 7am to 7pm - Pager - [phone]  After 7pm go to [email or web address] - password [email or web address]  And look for the night coverage person covering me after hours  [facility] Office  [phone]   **Disclaimer: This note may have been dictated with voice recognition software. Similar sounding words can inadvertently be transcribed and this note may contain transcription errors which may not have been corrected upon publication of note.**

## 2014-08-08 NOTE — ED Provider Notes (Signed)
CSN: [id number]     Arrival date & time 08/08/14  1501 History   None    Chief Complaint  Patient presents with  . Abdominal Pain  . Fatigue    HPI Pt with chronic pain syndrome, DM, IBS, TIA on ASA who presents for eval of abd pain and weakness.  Onset of abd pain was 9/12 and acute. Found to have left rectus sheath hematoma, spontaneous. Only on ASA not other anticoagulants. Was admitted for pain control with plan for SNF but left hospital before arranged.  General surgery was following pt and recommended conservative management.  Pt reports worsening LUQ pain, n/v. Poor appetite. Difficulty with preparing food due to weakness and overall taking care of self.   Reports generalized weakness and fatigue.  No orthostasis. Had one episode of fecal incontinence which she says is typical for her when she takes MiraLax while on narcotics.  Taking oxy 20mg , but pain returns after 2hrs. On fentanyl patch, last placed this AM.   Past Medical History  Diagnosis Date  . Diverticulosis   . Restless leg syndrome   . IBS (irritable bowel syndrome)   . IC (interstitial cystitis)   . Hypertension   . Diabetes mellitus     diet controlled  . Depression   . Fibromyalgia   . Anxiety   . Osteopenia   . Arthritis     in knees  . Dyslipidemia   . Chronic fatigue syndrome   . Chronic abdominal pain   . Esophageal reflux   . TIA (transient ischemic attack) 08/22/2012  . Obesity   . Sleep apnea    Past Surgical History  Procedure Laterality Date  . Total knee arthroplasty      bilateral  . Tonsillectomy and adenoidectomy    . Breast cyst excision  1978    left  . Laparotomy  1982    cyst removed from right ovary and removal of adhesions  . Abdominal exploration surgery  1983    diverticulum removed  . Appendectomy  1983    with exploration  . Laparoscopic incisional / umbilical / ventral hernia repair  1991, 2001, 2007    x 3  . Vaginal hysterectomy  1992  . Laparotomy  1991  . Scalp  surgery  1993, 1998    Removal of tumor  . Nasal sinus surgery  2004  . Rectocele repair  2009  . Cervical fusion  2010  . Bladder hydrodistention     Family History  Problem Relation Age of Onset  . Breast cancer Mother   . Dementia Mother   . Breast cancer Maternal Grandmother   . Throat cancer Maternal Grandmother   . Esophageal cancer Maternal Grandmother   . Breast cancer Other     maternal great aunts x 5  . Rheum arthritis Other   . Colon cancer Neg Hx   . Diabetes Father   . Dementia Father   . Diabetes Maternal Grandfather    History  Substance Use Topics  . Smoking status: Former Smoker -- 0.10 packs/day for 1 years    Types: Cigarettes    Quit date: 10/19/2012  . Smokeless tobacco: Never Used     Comment: using electronic cigarettes  . Alcohol Use: Yes     Comment: 2 drinks per month   OB History   Grav Para Term Preterm Abortions TAB SAB Ect Mult Living                 Review  of Systems  Constitutional: Positive for fatigue. Negative for fever, chills and diaphoresis.  Eyes: Negative for visual disturbance.  Respiratory: Negative for cough, shortness of breath and wheezing.   Cardiovascular: Negative for chest pain.  Gastrointestinal: Positive for nausea, vomiting and diarrhea. Negative for abdominal pain and blood in stool.  Genitourinary: Negative for dysuria and hematuria.  Musculoskeletal: Negative for back pain.  Skin: Negative for rash.  Neurological: Positive for weakness. Negative for numbness.  All other systems reviewed and are negative.     Allergies  Chlorzoxazone; Gabapentin; Gemfibrozil; Metronidazole; Morphine and related; Nitrofurantoin; Olmesartan medoxomil-hctz; Ondansetron; Pentosan polysulfate sodium; Propoxyphene n-acetaminophen; Ramipril; and Sulfonamide derivatives  Home Medications   Prior to Admission medications   Medication Sig Start Date End Date Taking? Authorizing Provider  ALPRAZolam Duanne Moron) 1 MG tablet Take 1 mg by  mouth 3 (three) times daily as needed for anxiety.     Historical Provider, MD  amLODipine (NORVASC) 10 MG tablet Take 10 mg by mouth at bedtime.    Historical Provider, MD  aspirin EC 325 MG tablet Take 325 mg by mouth daily.    Historical Provider, MD  desvenlafaxine (PRISTIQ) 50 MG 24 hr tablet Take 100 mg by mouth daily.     Historical Provider, MD  Diclofenac Sodium (PENNSAID) 2 % SOLN Place 1 application onto the skin at bedtime as needed (knee pain).    Historical Provider, MD  dicyclomine (BENTYL) 20 MG tablet Take 20 mg by mouth 3 (three) times daily as needed for spasms.    Historical Provider, MD  esomeprazole (NEXIUM) 40 MG capsule Take 40 mg by mouth at bedtime.    Historical Provider, MD  fentaNYL (DURAGESIC) 100 MCG/HR Place 100 mcg onto the skin every other day.     Historical Provider, MD  furosemide (LASIX) 20 MG tablet Take 20 mg by mouth every other day.    Historical Provider, MD  glucose blood (ONE TOUCH ULTRA TEST) test strip Use as instructed. Dx: 250.00 07/17/13   [name], NP  lidocaine (LIDODERM) 5 % Place 2 patches onto the skin at bedtime as needed (hip pain).  03/26/13   Historical Provider, MD  losartan (COZAAR) 100 MG tablet Take 100 mg by mouth at bedtime.    Historical Provider, MD  metFORMIN (GLUCOPHAGE-XR) 500 MG 24 hr tablet Take 2,000 mg by mouth daily with breakfast.    Historical Provider, MD  mirabegron ER (MYRBETRIQ) 50 MG TB24 Take 50 mg by mouth at bedtime.     Historical Provider, MD  nystatin (MYCOSTATIN/NYSTOP) 100000 UNIT/GM POWD Apply topically daily. Apply small amount under breasts every morning after shower    Historical Provider, MD  Omega-3 Fatty Acids (FISH OIL) 1000 MG CAPS Take 1,000 mg by mouth daily.    Historical Provider, MD  OVER THE COUNTER MEDICATION Take 650 mg by mouth daily. Plant Sterol Ester    Historical Provider, MD  oxyCODONE (OXY IR/ROXICODONE) 5 MG immediate release tablet Take 3-4 tablets (15-20 mg total) by mouth  every 6 (six) hours as needed for breakthrough pain (pain). 08/03/14   Shanker Kristeen Mans, MD  polyethylene glycol (MIRALAX / GLYCOLAX) packet Take 17 g by mouth 2 (two) times daily. 08/03/14   Shanker Kristeen Mans, MD  potassium gluconate 595 MG TABS tablet Take 595 mg by mouth daily as needed (leg cramps).    Historical Provider, MD  promethazine (PHENERGAN) 25 MG tablet Take 25 mg by mouth daily as needed for nausea or vomiting.  08/03/11  Rowe Clack, MD  rOPINIRole (REQUIP) 4 MG tablet Take 4 mg by mouth at bedtime.    Historical Provider, MD  sitaGLIPtin (JANUVIA) 100 MG tablet Take 100 mg by mouth at bedtime.    Historical Provider, MD  tiZANidine (ZANAFLEX) 4 MG tablet Take 4 mg by mouth at bedtime. 01/14/13   Historical Provider, MD  topiramate (TOPAMAX) 50 MG tablet Take 50 mg by mouth 2 (two) times daily as needed (headaches).  03/31/14   [name], NP  traZODone (DESYREL) 100 MG tablet Take 100 mg by mouth at bedtime. Take 1 - 2 tablets at bedtime as needed.    Historical Provider, MD  UNABLE TO FIND C-Pap machine , use nightly    Historical Provider, MD   BP 126/68  Pulse 88  Temp(Src) 98.3 F (36.8 C) (Oral)  Resp 18  Wt 246 lb (111.585 kg)  SpO2 98% Physical Exam  Nursing note and vitals reviewed. Constitutional: She is oriented to person, place, and time. She appears well-developed and well-nourished. No distress.  HENT:  Head: Normocephalic and atraumatic.  Nose: Nose normal.  Eyes: Conjunctivae are normal.  No conj pallor  Neck: Normal range of motion. Neck supple. No tracheal deviation present.  Cardiovascular: Normal rate, regular rhythm and normal heart sounds.   No murmur heard. Pulmonary/Chest: Effort normal and breath sounds normal. No respiratory distress. She has no rales.  Abdominal: Soft. Bowel sounds are normal. She exhibits no distension and no mass. There is tenderness (Epigastric and entire left side). There is guarding (voluntary). There is no  rebound.  No mass appreciated  Musculoskeletal: Normal range of motion. She exhibits no edema and no tenderness.  Neurological: She is alert and oriented to person, place, and time.  Skin: Skin is warm and dry. No rash noted.  Left sided abdominal ecchymoses  Psychiatric: She has a normal mood and affect.    ED Course  Procedures (including critical care time) Labs Review Labs Reviewed  CBC WITH DIFFERENTIAL - Abnormal; Notable for the following:    Hemoglobin 11.9 (*)    HCT 35.3 (*)    All other components within normal limits  COMPREHENSIVE METABOLIC PANEL - Abnormal; Notable for the following:    Sodium 135 (*)    Glucose, Bld 116 (*)    All other components within normal limits  GLUCOSE, CAPILLARY - Abnormal; Notable for the following:    Glucose-Capillary 102 (*)    All other components within normal limits  LIPASE, BLOOD  GLUCOSE, CAPILLARY  BASIC METABOLIC PANEL  CBC    Imaging Review No results found.   EKG Interpretation None      MDM   Final diagnoses:  Rectus sheath hematoma, subsequent encounter  Weakness generalized  Anemia, blood loss    Pt with CFS presents as subsequent encounter for left rectus sheath hematoma. Spontaneous.  On ASA.  Pain not well controlled at home.  Pain constant, supposed to go to SNF and primary wanted her admitted so she could be placed in SNF.  No signs of peritonitis or systemic toxicity to suggest surgical emergency. Do not feel repeat imaging necessary given rather benign exam. VSS.   Hgb near baseline. Screening labs otherwise unremarkable.  Hospitalist to admit and work on pain control and placement.      Tammy Sours, MD 08/09/14 [phone]

## 2014-08-08 NOTE — ED Notes (Signed)
Pt reports being seen here last Saturday for sudden onset of abd pain. Had ct scan done and was told she had a blood clot in abd that ruptured. Pt was admitted and then chose to be dc home instead of skill nursing facility. Pt is back today due to still having abd pain, n/v and reports generalized weakness and difficulty caring for herself at home.

## 2014-08-09 DIAGNOSIS — R5381 Other malaise: Secondary | ICD-10-CM | POA: Diagnosis not present

## 2014-08-09 LAB — BASIC METABOLIC PANEL
ANION GAP: 12 (ref 5–15)
BUN: 11 mg/dL (ref 6–23)
CHLORIDE: 101 meq/L (ref 96–112)
CO2: 25 meq/L (ref 19–32)
Calcium: 9 mg/dL (ref 8.4–10.5)
Creatinine, Ser: 0.75 mg/dL (ref 0.50–1.10)
GFR calc Af Amer: >90 mL/min (ref >90)
GFR calc non Af Amer: >90 mL/min (ref >90)
GLUCOSE: 104 mg/dL — AB (ref 70–99)
Potassium: 4.4 mEq/L (ref 3.7–5.3)
SODIUM: 138 meq/L (ref 137–147)

## 2014-08-09 LAB — CBC
HEMATOCRIT: 35 % — AB (ref 36.0–46.0)
HEMOGLOBIN: 11.6 g/dL — AB (ref 12.0–15.0)
MCH: 28.2 pg (ref 26.0–34.0)
MCHC: 33.1 g/dL (ref 30.0–36.0)
MCV: 85 fL (ref 78.0–100.0)
Platelets: 200 10*3/uL (ref 150–400)
RBC: 4.12 MIL/uL (ref 3.87–5.11)
RDW: 13.6 % (ref 11.5–15.5)
WBC: 4.5 10*3/uL (ref 4.0–10.5)

## 2014-08-09 LAB — GLUCOSE, CAPILLARY
GLUCOSE-CAPILLARY: 159 mg/dL — AB (ref 70–99)
Glucose-Capillary: 112 mg/dL — ABNORMAL HIGH (ref 70–99)

## 2014-08-09 MED ORDER — ALPRAZOLAM 1 MG PO TABS: 1.0000 mg | ORAL_TABLET | Freq: Three times a day (TID) | ORAL | Status: AC | PRN

## 2014-08-09 MED ORDER — OXYCODONE HCL 5 MG PO TABS: 15.0000 mg | ORAL_TABLET | Freq: Four times a day (QID) | ORAL | Status: DC | PRN

## 2014-08-09 MED ORDER — TIZANIDINE HCL 4 MG PO TABS: 4.0000 mg | ORAL_TABLET | Freq: Every day | ORAL | Status: AC

## 2014-08-09 MED ORDER — FENTANYL 100 MCG/HR TD PT72: 100.0000 ug | MEDICATED_PATCH | TRANSDERMAL | Status: DC

## 2014-08-09 NOTE — Clinical Social Work Note (Signed)
CSW spoke with [facility] SNF who reported they were still able to accept the patient.  CSW sent required clinicals. CSW paged doctor concerning patient discharge disposition.  CSW will continue to follow.  Domenica Reamer, [place] Social Worker [phone]

## 2014-08-09 NOTE — Evaluation (Signed)
Physical Therapy Evaluation Patient Details Name: [name] MRN: [id number] DOB: 1956-07-27 Today's Date: 08/09/2014   History of Present Illness  Patient is a 58 y/o female with PMH of IBS, hypertension, diabetes type 2, depression, anxiety disorder, TIA, OSA, history of ventral hernia repair, appendectomy, who presents with acute onset of abdominal pain. Pt found to have large LEFT rectus sheath hematoma superficial to mesh ventral hernia repair on CT scan. Surgery consulted and recommendating conservative treatment and pain control.  Pt went home after recent hospital stay and was unable to care for herself and therefore came back to the hospital.   Clinical Impression  Pt admitted for above reasons. Pt currently with functional limitations due to the deficits listed below (see PT Problem List).  Pt will benefit from skilled PT to increase their independence and safety with mobility to allow discharge to home eventually.  Would benefit from SNF until mobility, balance, and ability to care for herself are improved.      Follow Up Recommendations SNF;Supervision for mobility/OOB    Equipment Recommendations  None recommended by PT    Recommendations for Other Services OT consult     Precautions / Restrictions Precautions Precautions: Fall Restrictions Weight Bearing Restrictions: No      Mobility  Bed Mobility Overal bed mobility: Modified Independent             General bed mobility comments: HOB elevated, use of rail.  Transfers Overall transfer level: Needs assistance Equipment used: None Transfers: Sit to/from Stand Sit to Stand: Min guard         General transfer comment: took increased time and required use of bilateral UE's  Ambulation/Gait Ambulation/Gait assistance: Min guard Ambulation Distance (Feet): 10 Feet (also ambulated 5 feet) Assistive device: Rolling walker (2 wheeled) Gait Pattern/deviations: Step-through pattern;Decreased stride  length Gait velocity: Decreased Gait velocity interpretation: Below normal speed for age/gender General Gait Details: When up pt reports a constant "swimmy" feeling in her head which she attributes to her pain meds.  Did not get worse with standing or gait.  Pt needed to sit after 10 feet due to fatigue.  Stairs            Wheelchair Mobility    Modified Rankin (Stroke Patients Only)       Balance Overall balance assessment: Needs assistance Sitting-balance support: Feet supported;No upper extremity supported Sitting balance-Leahy Scale: Good       Standing balance-Leahy Scale: Fair                               Pertinent Vitals/Pain Pain Assessment: 0-10 Pain Score: 8  Pain Location: abdomen Pain Intervention(s): Limited activity within patient's tolerance;Premedicated before session;Repositioned    Home Living Family/patient expects to be discharged to:: Skilled nursing facility                      Prior Function Level of Independence: Independent with assistive device(s)         Comments: Pt was living at home, but reports she was not able to care for herself properly. Reports she was afraid to take her pain meds as prescribed due to fear of falls as they make her "swimmy" in the head.     Hand Dominance        Extremity/Trunk Assessment   Upper Extremity Assessment: Defer to OT evaluation  Lower Extremity Assessment: Generalized weakness      Cervical / Trunk Assessment: Normal  Communication   Communication: No difficulties  Cognition Arousal/Alertness: Awake/alert Behavior During Therapy: WFL for tasks assessed/performed Overall Cognitive Status: Within Functional Limits for tasks assessed                      General Comments      Exercises        Assessment/Plan    PT Assessment Patient needs continued PT services  PT Diagnosis Generalized weakness;Acute pain   PT Problem List  Decreased strength;Decreased activity tolerance;Decreased balance;Decreased mobility;Pain;Obesity  PT Treatment Interventions DME instruction;Gait training;Stair training;Balance training;Therapeutic exercise;Patient/family education   PT Goals (Current goals can be found in the Care Plan section) Acute Rehab PT Goals Patient Stated Goal: to get better, return home, and be able to visit parents who have dementia PT Goal Formulation: With patient Time For Goal Achievement: 08/16/14 Potential to Achieve Goals: Good    Frequency Min 3X/week   Barriers to discharge Decreased caregiver support;Inaccessible home environment 20 steps to enter    Co-evaluation               End of Session Equipment Utilized During Treatment: Gait belt Activity Tolerance: Patient limited by fatigue Patient left: in chair;with call bell/phone within reach Nurse Communication: Mobility status    Functional Assessment Tool Used: clinical judgement Functional Limitation: Mobility: Walking and moving around Mobility: Walking and Moving Around Current Status (L4562): At least 20 percent but less than 40 percent impaired, limited or restricted Mobility: Walking and Moving Around Goal Status [phone]): At least 1 percent but less than 20 percent impaired, limited or restricted    Time: 1001-1024 PT Time Calculation (min): 23 min   Charges:   PT Evaluation $Initial PT Evaluation Tier I: 1 Procedure PT Treatments $Gait Training: 8-22 mins   PT G Codes:   Functional Assessment Tool Used: clinical judgement Functional Limitation: Mobility: Walking and moving around    [employer] 08/09/2014, 10:43 AM Lavonia Dana, PT  [phone] 08/09/2014

## 2014-08-09 NOTE — Progress Notes (Signed)
Report called to [facility] to [facility].

## 2014-08-09 NOTE — Telephone Encounter (Signed)
Patient was admitted over the weekend and will be sent to SNF via hospital.

## 2014-08-09 NOTE — Progress Notes (Signed)
UR completed 

## 2014-08-09 NOTE — Evaluation (Signed)
Occupational Therapy Evaluation Patient Details Name: [name] MRN: [id number] DOB: 12-12-1955 Today's Date: 08/09/2014    History of Present Illness Patient is a 58 y/o female with PMH of IBS, hypertension, diabetes type 2, depression, anxiety disorder, TIA, OSA, history of ventral hernia repair, appendectomy, who presents with acute onset of abdominal pain. Pt found to have large LEFT rectus sheath hematoma superficial to mesh ventral hernia repair on CT scan. Surgery consulted and recommendating conservative treatment and pain control.  Pt went home after recent hospital stay and was unable to care for herself and therefore came back to the hospital.    Clinical Impression   PT admitted with readmitted due to weakness with recent LT rectus sheath hematoma. Pt currently with functional limitiations due to the deficits listed below (see OT problem list). PTA at home requiring (A) for adls after recent hospitalization. Pt will benefit from skilled OT to increase their independence and safety with adls and balance to allow discharge SNF. OT to defer all further care to SNF level. Pt currently with balance deficits with adls.     Follow Up Recommendations  SNF    Equipment Recommendations  Other (comment) (defer snf)    Recommendations for Other Services       Precautions / Restrictions Precautions Precautions: Fall Restrictions Weight Bearing Restrictions: No      Mobility Bed Mobility Overal bed mobility: Modified Independent             General bed mobility comments: in chair on arrival  Transfers Overall transfer level: Needs assistance Equipment used: Rolling walker (2 wheeled) Transfers: Sit to/from Stand Sit to Stand: Min guard         General transfer comment: took increased time and required use of bilateral UE's    Balance Overall balance assessment: Needs assistance Sitting-balance support: Feet supported;No upper extremity supported Sitting  balance-Leahy Scale: Good     Standing balance support: No upper extremity supported;During functional activity Standing balance-Leahy Scale: Fair                              ADL Overall ADL's : Needs assistance/impaired Eating/Feeding: Modified independent   Grooming: Min guard;Wash/dry hands Grooming Details (indicate cue type and reason): fatigued adn requesting to sit down             Lower Body Dressing: Minimal assistance;Sit to/from stand Lower Body Dressing Details (indicate cue type and reason): pt able to cross bil LE with LT weaker than Rt. Pt reports Lt abdomen pain with crossing. pt required extended time Toilet Transfer: Minimal assistance;Ambulation;RW;Regular Glass blower/designer Details (indicate cue type and reason): pt used grab bar Toileting- Clothing Manipulation and Hygiene: Minimal assistance;Sit to/from stand Toileting - Clothing Manipulation Details (indicate cue type and reason): (A) to manage clothes due to LOB with bil UE use     Functional mobility during ADLs: Min guard;Rolling walker General ADL Comments: pt demonstrates balance deficits with basic adls.pt reports 10 out 10 fatigue with chair to toilet to sink task. Pt required rest break x1     Vision                     Perception     Praxis      Pertinent Vitals/Pain Pain Assessment: 0-10 Pain Score: 8  Pain Location: abdomen Pain Intervention(s): Limited activity within patient's tolerance;Premedicated before session;Repositioned     Hand Dominance Right  Extremity/Trunk Assessment Upper Extremity Assessment Upper Extremity Assessment: Overall WFL for tasks assessed   Lower Extremity Assessment Lower Extremity Assessment: Defer to PT evaluation   Cervical / Trunk Assessment Cervical / Trunk Assessment: Normal   Communication Communication Communication: No difficulties   Cognition Arousal/Alertness: Awake/alert Behavior During Therapy: WFL for  tasks assessed/performed Overall Cognitive Status: Within Functional Limits for tasks assessed                     General Comments       Exercises       Shoulder Instructions      Home Living Family/patient expects to be discharged to:: Skilled nursing facility                                        Prior Functioning/Environment Level of Independence: Independent with assistive device(s);Needs assistance  Gait / Transfers Assistance Needed: used RW ADL's / Homemaking Assistance Needed: pt reports inability to cook clean care for cat and adls were very difficult   Comments: Pt was living at home, but reports she was not able to care for herself properly. Reports she was afraid to take her pain meds as prescribed due to fear of falls as they make her "swimmy" in the head.    OT Diagnosis:     OT Problem List:     OT Treatment/Interventions:      OT Goals(Current goals can be found in the care plan section) Acute Rehab OT Goals Patient Stated Goal: to go to rehab to return home so i can manage  OT Frequency:     Barriers to D/C:            Co-evaluation              End of Session Equipment Utilized During Treatment: Rolling walker Nurse Communication: Mobility status;Precautions  Activity Tolerance: Patient tolerated treatment well Patient left: in chair;with call bell/phone within reach   Time: 6962-9528 OT Time Calculation (min): 11 min Charges:  OT General Charges $OT Visit: 1 Procedure OT Evaluation $Initial OT Evaluation Tier I: 1 Procedure OT Treatments $Self Care/Home Management : 8-22 mins G-Codes: OT G-codes **NOT FOR INPATIENT CLASS** Functional Assessment Tool Used: clinical judgement Functional Limitation: Self care Self Care Current Status (U1324): At least 40 percent but less than 60 percent impaired, limited or restricted Self Care Goal Status (M0102): At least 40 percent but less than 60 percent impaired, limited  or restricted Self Care Discharge Status [phone]): At least 40 percent but less than 60 percent impaired, limited or restricted  Peri Maris 08/09/2014, 11:20 AM Pager: [phone]

## 2014-08-09 NOTE — Clinical Social Work Psychosocial (Signed)
Clinical Social Work Department BRIEF PSYCHOSOCIAL ASSESSMENT 08/09/2014  Patient:  [name], [name]     Account Number:  [id number]     [place] date:  08/08/2014  Clinical Social Worker:  Domenica Reamer, [place]  Date/Time:  08/09/2014 10:23 AM  Referred by:  Physician  Date Referred:  08/09/2014 Referred for  SNF Placement   Other Referral:   Interview type:  Patient Other interview type:    PSYCHOSOCIAL DATA Living Status:  ALONE Admitted from facility:   Level of care:   Primary support name:  Bethena Midget Primary support relationship to patient:  CHILD, ADULT Degree of support available:   Patient reported high level of emotional support but stated that her daughter, who lives in [place], is very busy with her two children and her job and is unable to offer much physicial support.    CURRENT CONCERNS Current Concerns  Post-Acute Placement   Other Concerns:    SOCIAL WORK ASSESSMENT / PLAN CSW spoke with patient about SNF placment.  Patient reported that she now realizes she needs SNF in order to return to living independently and being capable of taking care of her two elderly parents who both have dementia. Patient stated she has been in contact with [facility] about [facility] bed but that she would also be willing to go to [facility]. CSW will continue to follow.   Assessment/plan status:  Psychosocial Support/Ongoing Assessment of Needs Other assessment/ plan:   FL2 update   Information/referral to community resources:   [facility]    PATIENT'S/FAMILY'S RESPONSE TO PLAN OF CARE: Patient is agreeable to SNF placement and is now ready to committ to SNF so that she won't have to return to the hospital.       Domenica Reamer, [place] Social Worker [phone]

## 2014-08-09 NOTE — Discharge Summary (Signed)
Physician Discharge Summary  [name] DGU:[id number] DOB: 11-25-1955 DOA: 08/08/2014  PCP: [name], NP  Admit date: 08/08/2014 Discharge date: 08/09/2014  Time spent: > 35 minutes  Recommendations for Outpatient Follow-up:  1. Continue to monitor patient and adjust pain medication with improvement 2. Physical therapy while at SNF  Discharge Diagnoses:  Please see list below.  Discharge Condition: stable  Diet recommendation: diabetic diet  Filed Weights   08/08/14 1512 08/08/14 1827  Weight: 111.585 kg (246 lb) 116.212 kg (256 lb 3.2 oz)    History of present illness:  From original history of present illness: [name] is a 58 y.o. female, who was recently discharged from [facility] for spontaneous hematoma in the abdomen a rectus sheath, patient was on aspirin, evaluated by surgical service then, her hemoglobin has stabilized, patient has been complaining of significant abdominal pain pain, or she was discharged on large dose by mouth oxycodone as needed, patient was supposed to be discharged to skilled nursing facility, but she decided to be discharged home so she can take care of her cat, but patient reports since then she has not been able to take care of herself.  Hospital Course:   1. weakness and debilitation:  - Discussed social work will its bed available for skilled nursing facility.  2. Abdominal pain/abdominal wall hematoma:  - Continue home pain management regimen,  as patient reports improved pain control on this regimen  3. diabetes mellitus:  - Recommend diabetic diet and continuing home regimen. Serum creatinine 0.75  4. Obstructive sleep apnea:  Continue with CPAP   5. GERD:  Continue with PPI   6. TIA:  Continue with aspirin   7. Dyslipidemia:  Continue with fish oil   8. Hypertension:  Continue with home medication   9. Chronic pain syndrome:  Continue with fentanyl patch,   10. Restless leg syndrome:   Stable continue with  Requip   Procedures:  None  Consultations:  None this admission.  Was seen by general surgery on prior admission: Dr. Ninfa Linden  Discharge Exam: Filed Vitals:   08/09/14 0501  BP: 115/58  Pulse: 66  Temp: 98.2 F (36.8 C)  Resp: 16    General: Pt in nad, alert and awake Cardiovascular: rrr, no mrg Respiratory: cta bl, no wheezes  Discharge Instructions You were cared for by a hospitalist during your hospital stay. If you have any questions about your discharge medications or the care you received while you were in the hospital after you are discharged, you can call the unit and asked to speak with the hospitalist on call if the hospitalist that took care of you is not available. Once you are discharged, your primary care physician will handle any further medical issues. Please note that NO REFILLS for any discharge medications will be authorized once you are discharged, as it is imperative that you return to your primary care physician (or establish a relationship with a primary care physician if you do not have one) for your aftercare needs so that they can reassess your need for medications and monitor your lab values.  Discharge Instructions   Diet - low sodium heart healthy    Complete by:  As directed      Discharge instructions    Complete by:  As directed   Please be sure to follow up with your primary care physician in 1-2 weeks or sooner should any new concerns arise.     Increase activity slowly  Complete by:  As directed           Current Discharge Medication List    CONTINUE these medications which have CHANGED   Details  ALPRAZolam (XANAX) 1 MG tablet Take 1 tablet (1 mg total) by mouth 3 (three) times daily as needed for anxiety. Qty: 30 tablet, Refills: 0    fentaNYL (DURAGESIC) 100 MCG/HR Place 1 patch (100 mcg total) onto the skin every other day. Qty: 5 patch, Refills: 0    oxyCODONE (OXY IR/ROXICODONE) 5 MG immediate  release tablet Take 3-4 tablets (15-20 mg total) by mouth every 6 (six) hours as needed for breakthrough pain (pain). Qty: 30 tablet, Refills: 0    tiZANidine (ZANAFLEX) 4 MG tablet Take 1 tablet (4 mg total) by mouth at bedtime. Qty: 30 tablet, Refills: 0      CONTINUE these medications which have NOT CHANGED   Details  amLODipine (NORVASC) 10 MG tablet Take 10 mg by mouth at bedtime.    aspirin EC 325 MG tablet Take 325 mg by mouth daily.    desvenlafaxine (PRISTIQ) 50 MG 24 hr tablet Take 100 mg by mouth daily.     Diclofenac Sodium (PENNSAID) 2 % SOLN Place 1 application onto the skin at bedtime as needed (knee pain).    dicyclomine (BENTYL) 20 MG tablet Take 20 mg by mouth 3 (three) times daily as needed for spasms.    esomeprazole (NEXIUM) 40 MG capsule Take 40 mg by mouth at bedtime.    furosemide (LASIX) 20 MG tablet Take 20 mg by mouth every other day.    glucose blood (ONE TOUCH ULTRA TEST) test strip Use as instructed. Dx: 250.00 Qty: 100 each, Refills: 6    lidocaine (LIDODERM) 5 % Place 2 patches onto the skin at bedtime as needed (hip pain).     losartan (COZAAR) 100 MG tablet Take 100 mg by mouth at bedtime.    metFORMIN (GLUCOPHAGE-XR) 500 MG 24 hr tablet Take 2,000 mg by mouth daily with breakfast.    mirabegron ER (MYRBETRIQ) 50 MG TB24 Take 50 mg by mouth at bedtime.     nystatin (MYCOSTATIN/NYSTOP) 100000 UNIT/GM POWD Apply topically daily. Apply small amount under breasts every morning after shower    Omega-3 Fatty Acids (FISH OIL) 1000 MG CAPS Take 1,000 mg by mouth daily.    OVER THE COUNTER MEDICATION Take 650 mg by mouth daily. Plant Sterol Ester    polyethylene glycol (MIRALAX / GLYCOLAX) packet Take 17 g by mouth 2 (two) times daily. Qty: 14 each, Refills: 0    potassium gluconate 595 MG TABS tablet Take 595 mg by mouth daily as needed (leg cramps).    promethazine (PHENERGAN) 25 MG tablet Take 25 mg by mouth daily as needed for nausea or  vomiting.     rOPINIRole (REQUIP) 4 MG tablet Take 4 mg by mouth at bedtime.    sitaGLIPtin (JANUVIA) 100 MG tablet Take 100 mg by mouth at bedtime.    topiramate (TOPAMAX) 50 MG tablet Take 50 mg by mouth 2 (two) times daily as needed (headaches).     traZODone (DESYREL) 100 MG tablet Take 100 mg by mouth at bedtime. Take 1 - 2 tablets at bedtime as needed.    UNABLE TO FIND C-Pap machine , use nightly       Allergies  Allergen Reactions  . Chlorzoxazone Other (See Comments)    Unknown reaction  . Gabapentin Nausea And Vomiting  . Gemfibrozil Nausea And Vomiting  .  Metronidazole Nausea And Vomiting  . Morphine And Related Nausea And Vomiting  . Nitrofurantoin Nausea And Vomiting  . Olmesartan Medoxomil-Hctz Nausea And Vomiting  . Ondansetron Nausea And Vomiting  . Pentosan Polysulfate Sodium Other (See Comments)    Unknown reaction  . Propoxyphene N-Acetaminophen Nausea And Vomiting  . Ramipril Cough  . Sulfonamide Derivatives Nausea And Vomiting      The results of significant diagnostics from this hospitalization (including imaging, microbiology, ancillary and laboratory) are listed below for reference.    Significant Diagnostic Studies: Ct Cta Abd/pel W/cm &/or W/o Cm  07/31/2014   CLINICAL DATA:  LEFT upper quadrant abdominal pain.  EXAM: CTA ABDOMEN AND PELVIS wITHOUT AND WITH CONTRAST  TECHNIQUE: Multidetector CT imaging of the abdomen and pelvis was performed using the standard protocol during bolus administration of intravenous contrast. Multiplanar reconstructed images and MIPs were obtained and reviewed to evaluate the vascular anatomy.  CONTRAST:  167mL OMNIPAQUE IOHEXOL 350 MG/ML SOLN  COMPARISON:  07/16/2013  FINDINGS: Musculoskeletal: Lumbosacral degenerative disease. No aggressive osseous lesions appear  Lung Bases: Normal.  Liver: Mild hepatomegaly with 18.6 cm liver span. Associated rounding of the inferior RIGHT hepatic lobe.  Spleen:  Normal.  Gallbladder:   Normal.  Common bile duct:  Normal.  Pancreas:  Normal.  Adrenal glands:  Normal.  Kidneys:  Normal.  Both ureters appear normal.  Stomach:  Normal.  Small bowel:  Normal.  Colon: Appendix not identified. Diverticulosis without diverticulitis.  Pelvic Genitourinary:  Hysterectomy.  Urinary bladder normal.  Vasculature: No acute vascular abnormality.  Body Wall: LEFT rectus sheath hematoma is present superficial to mesh ventral hernia repair. This measures 44 mm AP, 10 cm transverse and 13 cm craniocaudal. No active extravasation into the hematoma. Midline scarring of the abdominal wall.  Review of the MIP images confirms the above findings.  IMPRESSION: Large LEFT rectus sheath hematoma superficial to mesh ventral hernia repair. No acute intra-abdominal abnormality.   Electronically Signed   By: Dereck Ligas M.D.   On: 07/31/2014 18:30    Microbiology: No results found for this or any previous visit (from the past 240 hour(s)).   Labs: Basic Metabolic Panel:  Recent Labs Lab 08/08/14 1530 08/09/14 0850  NA 135* 138  K 4.5 4.4  CL 99 101  CO2 26 25  GLUCOSE 116* 104*  BUN 14 11  CREATININE 0.72 0.75  CALCIUM 9.2 9.0   Liver Function Tests:  Recent Labs Lab 08/08/14 1530  AST 19  ALT 19  ALKPHOS 77  BILITOT 0.5  PROT 6.5  ALBUMIN 3.5    Recent Labs Lab 08/08/14 1530  LIPASE 12   No results found for this basename: AMMONIA,  in the last 168 hours CBC:  Recent Labs Lab 08/08/14 1530 08/09/14 0850  WBC 5.2 4.5  NEUTROABS 3.5  --   HGB 11.9* 11.6*  HCT 35.3* 35.0*  MCV 83.3 85.0  PLT 212 200   Cardiac Enzymes: No results found for this basename: CKTOTAL, CKMB, CKMBINDEX, TROPONINI,  in the last 168 hours BNP: BNP (last 3 results) No results found for this basename: PROBNP,  in the last 8760 hours CBG:  Recent Labs Lab 08/03/14 1219 08/08/14 1820 08/08/14 2227 08/09/14 0814 08/09/14 1156  GLUCAP 159* 86 102* 112* 159*       Signed:  Velvet Bathe  [facility] 08/09/2014, 1:28 PM

## 2014-08-09 NOTE — Progress Notes (Signed)
D/c to SNF via ambulance.  Breathing regular and unlabored A&O.  Pain denied. VSS. D/c orders sent with paramedics

## 2014-08-09 NOTE — Clinical Social Work Note (Addendum)
Patient will discharge to [facility] Report #:[id number] Anticipated discharge date: 9/21 Family notified: Patient to notify daughter, Wendi Snipes by [employer]- called at 2:40  [place] signing off.  Domenica Reamer, [place] Social Worker [phone]

## 2014-08-09 NOTE — ED Provider Notes (Signed)
I saw and evaluated the patient, reviewed the resident's note and I agree with the findings and plan.   EKG Interpretation None      Patient be admitted for pain control for her rectus sheath hematoma.  Overall well-appearing.  Hoy Morn, MD 08/09/14 [phone]

## 2014-08-09 NOTE — Clinical Social Work Placement (Signed)
Clinical Social Work Department CLINICAL SOCIAL WORK PLACEMENT NOTE 08/09/2014  Patient:  [name], [name]  Account Number:  [id number] [place] date:  08/08/2014  Clinical Social Worker:  Domenica Reamer, CLINICAL SOCIAL WORKER  Date/time:  08/09/2014 02:37 PM  Clinical Social Work is seeking post-discharge placement for this patient at the following level of care:   SKILLED NURSING   (*CSW will update this form in Epic as items are completed)   08/09/2014  Patient/family provided with [place] Department of Clinical Social Work's list of facilities offering this level of care within the geographic area requested by the patient (or if unable, by the patient's family).  08/09/2014  Patient/family informed of their freedom to choose among providers that offer the needed level of care, that participate in Medicare, Medicaid or managed care program needed by the patient, have an available bed and are willing to accept the patient.  08/09/2014  Patient/family informed of [facility]' ownership interest in [facility], as well as of the fact that they are under no obligation to receive care at this facility.  PASARR submitted to EDS on 08/09/2014 PASARR number received on 08/09/2014  FL2 transmitted to all facilities in geographic area requested by pt/family on  08/09/2014 FL2 transmitted to all facilities within larger geographic area on   Patient informed that his/her managed care company has contracts with or will negotiate with  certain facilities, including the following:     Patient/family informed of bed offers received:  08/09/2014 Patient chooses bed at [facility] Physician recommends and patient chooses bed at    Patient to be transferred to [place] on  08/09/2014 Patient to be transferred to facility by [facility] Patient and family notified of transfer on 08/09/2014 Name of family member notified:  Bethena Midget  The following physician request were  entered in Epic:   Additional Comments:   Domenica Reamer, [place] Social Worker [phone]

## 2014-08-10 ENCOUNTER — Ambulatory Visit: Payer: PRIVATE HEALTH INSURANCE | Admitting: Family

## 2014-08-10 ENCOUNTER — Telehealth: Payer: Self-pay | Admitting: *Deleted

## 2014-08-10 NOTE — Telephone Encounter (Signed)
Please do not charge No Show d/t pt in hospital/SLS Thanks. Confidential Office Message [address] [place], [place] [address] p. [phone] f. [phone] To: Arnold Long at [facility] (After Hours Triage) Fax: [phone] From: [facility] Date/ Time: 08/09/2014 9:29 PM Taken By: Jackelyn Knife, CSR Caller: [name]: not collected Patient: Jenna, Rowe DOB: Mar 23, 1956 Phone: [phone] Reason for Call: Discharged from hospital today to [place]; Staff not allowing her to take meds from home, and they do not have correct orders. Spoke with pt who states orders for meds have now been corrected, but she also notes she hopes to be transferred to [facility] 08/10/14, does not want to stay at [facility]. Regarding Appointment: Appt Date: Appt Time: Unknown Provider: Reason: Confidential Details: Outcome:

## 2014-08-11 ENCOUNTER — Ambulatory Visit: Payer: PRIVATE HEALTH INSURANCE | Admitting: Family

## 2014-08-18 NOTE — Progress Notes (Signed)
AGREE WITH ABOVE

## 2014-08-20 ENCOUNTER — Other Ambulatory Visit: Payer: Self-pay | Admitting: Family

## 2014-08-23 NOTE — Telephone Encounter (Signed)
Rx request to pharmacy/SLS  

## 2014-08-30 ENCOUNTER — Encounter: Payer: Self-pay | Admitting: Family

## 2014-08-30 ENCOUNTER — Other Ambulatory Visit: Payer: Self-pay | Admitting: Family

## 2014-08-30 ENCOUNTER — Ambulatory Visit (INDEPENDENT_AMBULATORY_CARE_PROVIDER_SITE_OTHER): Payer: PRIVATE HEALTH INSURANCE | Admitting: Family

## 2014-08-30 VITALS — BP 122/67 | HR 73 | Temp 98.6°F | Resp 16 | Ht 65.25 in | Wt 247.6 lb

## 2014-08-30 DIAGNOSIS — E1165 Type 2 diabetes mellitus with hyperglycemia: Secondary | ICD-10-CM

## 2014-08-30 DIAGNOSIS — IMO0002 Reserved for concepts with insufficient information to code with codable children: Secondary | ICD-10-CM

## 2014-08-30 DIAGNOSIS — D501 Sideropenic dysphagia: Secondary | ICD-10-CM

## 2014-08-30 DIAGNOSIS — I10 Essential (primary) hypertension: Secondary | ICD-10-CM

## 2014-08-30 DIAGNOSIS — G4733 Obstructive sleep apnea (adult) (pediatric): Secondary | ICD-10-CM

## 2014-08-30 DIAGNOSIS — S301XXD Contusion of abdominal wall, subsequent encounter: Secondary | ICD-10-CM

## 2014-08-30 DIAGNOSIS — D508 Other iron deficiency anemias: Secondary | ICD-10-CM

## 2014-08-30 LAB — CBC WITH DIFFERENTIAL/PLATELET
BASOS PCT: 0.4 % (ref 0.0–3.0)
Basophils Absolute: 0 10*3/uL (ref 0.0–0.1)
EOS PCT: 1.7 % (ref 0.0–5.0)
Eosinophils Absolute: 0.1 10*3/uL (ref 0.0–0.7)
HEMATOCRIT: 39.9 % (ref 36.0–46.0)
Hemoglobin: 13 g/dL (ref 12.0–15.0)
LYMPHS ABS: 1.7 10*3/uL (ref 0.7–4.0)
Lymphocytes Relative: 30.7 % (ref 12.0–46.0)
MCHC: 32.6 g/dL (ref 30.0–36.0)
MCV: 84.9 fl (ref 78.0–100.0)
MONO ABS: 0.3 10*3/uL (ref 0.1–1.0)
MONOS PCT: 6 % (ref 3.0–12.0)
Neutro Abs: 3.3 10*3/uL (ref 1.4–7.7)
Neutrophils Relative %: 61.2 % (ref 43.0–77.0)
Platelets: 179 10*3/uL (ref 150.0–400.0)
RBC: 4.7 Mil/uL (ref 3.87–5.11)
RDW: 14.4 % (ref 11.5–15.5)
WBC: 5.4 10*3/uL (ref 4.0–10.5)

## 2014-08-30 LAB — IRON: Iron: 33 ug/dL — ABNORMAL LOW (ref 42–145)

## 2014-08-30 NOTE — Assessment & Plan Note (Signed)
Recent control has been stable. Too soon to recheck A1C, continue [place].

## 2014-08-30 NOTE — Assessment & Plan Note (Addendum)
Clinically improved, obtain follow up CBC. She is wondering if she needs to continue iron. Will check level to determine ongoing iron needs. Pt to keep upcoming appointment with gen surgeon.

## 2014-08-30 NOTE — Patient Instructions (Signed)
Please complete your lab work prior to leaving. Follow up in 2 months, sooner if problems/concerns.

## 2014-08-30 NOTE — Assessment & Plan Note (Signed)
BP stable on current meds.   

## 2014-08-30 NOTE — Progress Notes (Signed)
Subjective:    Patient ID: [name], female    DOB: Jul 26, 1956, 58 y.o.   MRN: [id number]  HPI  [name] is a 58 yr old female who presents today for hospital follow up.  Records are reviewed. She was re-admitted to the hospital on 9/20 following discharge 9/15 for a rectus sheath hematoma.  She was discharged to SNF on 9/21.  She was released on 10/1 from snf.  She is working with home health PT.  She hopes to do some PT in the pool. She reports that abdominal pain has improved. She has follow up with Dr. Excell Seltzer on 10/28.    HTN- denies significant swelling.  BP Readings from Last 3 Encounters:  08/30/14 122/67  08/09/14 116/62  08/03/14 119/63    DM2- Has not been checking sugars regularly.  Lab Results  Component Value Date   HGBA1C 6.9* 06/08/2014   HGBA1C 6.8* 02/09/2014   HGBA1C 7.9* 11/02/2013   Lab Results  Component Value Date   MICROALBUR 0.57 02/09/2014   LDLCALC 111* 02/09/2014   CREATININE 0.75 08/09/2014    Review of Systems See HPI  Past Medical History  Diagnosis Date  . Diverticulosis   . Restless leg syndrome   . IBS (irritable bowel syndrome)   . IC (interstitial cystitis)   . Hypertension   . Diabetes mellitus     diet controlled  . Depression   . Fibromyalgia   . Anxiety   . Osteopenia   . Arthritis     in knees  . Dyslipidemia   . Chronic fatigue syndrome   . Chronic abdominal pain   . Esophageal reflux   . TIA (transient ischemic attack) 08/22/2012  . Obesity   . Sleep apnea     History   Social History  . Marital Status: Divorced    Spouse Name: N/A    Number of Children: 2  . Years of Education: N/A   Occupational History  . disabled    Social History Main Topics  . Smoking status: Former Smoker -- 0.10 packs/day for 1 years    Types: Cigarettes    Quit date: 10/19/2012  . Smokeless tobacco: Never Used     Comment: using electronic cigarettes  . Alcohol Use: Yes     Comment: 2 drinks per month  . Drug Use:  No  . Sexual Activity: Not on file   Other Topics Concern  . Not on file   Social History Narrative   Disabled   Divorced, lives alone - 1 dtr, 1 son    Past Surgical History  Procedure Laterality Date  . Total knee arthroplasty      bilateral  . Tonsillectomy and adenoidectomy    . Breast cyst excision  1978    left  . Laparotomy  1982    cyst removed from right ovary and removal of adhesions  . Abdominal exploration surgery  1983    diverticulum removed  . Appendectomy  1983    with exploration  . Laparoscopic incisional / umbilical / ventral hernia repair  1991, 2001, 2007    x 3  . Vaginal hysterectomy  1992  . Laparotomy  1991  . Scalp surgery  1993, 1998    Removal of tumor  . Nasal sinus surgery  2004  . Rectocele repair  2009  . Cervical fusion  2010  . Bladder hydrodistention      Family History  Problem Relation Age of Onset  . Breast  cancer Mother   . Dementia Mother   . Breast cancer Maternal Grandmother   . Throat cancer Maternal Grandmother   . Esophageal cancer Maternal Grandmother   . Breast cancer Other     maternal great aunts x 5  . Rheum arthritis Other   . Colon cancer Neg Hx   . Diabetes Father   . Dementia Father   . Diabetes Maternal Grandfather     Allergies  Allergen Reactions  . Chlorzoxazone Other (See Comments)    Unknown reaction  . Gabapentin Nausea And Vomiting  . Gemfibrozil Nausea And Vomiting  . Metronidazole Nausea And Vomiting  . Morphine And Related Nausea And Vomiting  . Nitrofurantoin Nausea And Vomiting  . Olmesartan Medoxomil-Hctz Nausea And Vomiting  . Ondansetron Nausea And Vomiting  . Pentosan Polysulfate Sodium Other (See Comments)    Unknown reaction  . Propoxyphene N-Acetaminophen Nausea And Vomiting  . Ramipril Cough  . Sulfonamide Derivatives Nausea And Vomiting    Current Outpatient Prescriptions on File Prior to Visit  Medication Sig Dispense Refill  . ALPRAZolam (XANAX) 1 MG tablet Take 1  tablet (1 mg total) by mouth 3 (three) times daily as needed for anxiety.  30 tablet  0  . amLODipine (NORVASC) 10 MG tablet Take 10 mg by mouth at bedtime.      Marland Kitchen aspirin EC 325 MG tablet Take 325 mg by mouth daily.      Marland Kitchen desvenlafaxine (PRISTIQ) 50 MG 24 hr tablet Take 100 mg by mouth daily.       . Diclofenac Sodium (PENNSAID) 2 % SOLN Place 1 application onto the skin at bedtime as needed (knee pain).      Marland Kitchen dicyclomine (BENTYL) 20 MG tablet Take 20 mg by mouth 3 (three) times daily as needed for spasms.      Marland Kitchen esomeprazole (NEXIUM) 40 MG capsule Take 40 mg by mouth at bedtime.      . fentaNYL (DURAGESIC) 100 MCG/HR Place 1 patch (100 mcg total) onto the skin every other day.  5 patch  0  . furosemide (LASIX) 20 MG tablet Take 20 mg by mouth every other day.      Marland Kitchen glucose blood (ONE TOUCH ULTRA TEST) test strip Use as instructed. Dx: 250.00  100 each  6  . lidocaine (LIDODERM) 5 % Place 2 patches onto the skin at bedtime as needed (hip pain).       Marland Kitchen losartan (COZAAR) 100 MG tablet Take 100 mg by mouth at bedtime.      . metFORMIN (GLUCOPHAGE-XR) 500 MG 24 hr tablet Take 2,000 mg by mouth daily with breakfast.      . mirabegron ER (MYRBETRIQ) 50 MG TB24 Take 50 mg by mouth at bedtime.       Marland Kitchen nystatin (MYCOSTATIN/NYSTOP) 100000 UNIT/GM POWD APPLY TOPICALLY 2 (TWO) TIMES DAILY.  15 g  0  . Omega-3 Fatty Acids (FISH OIL) 1000 MG CAPS Take 1,000 mg by mouth daily.      Marland Kitchen OVER THE COUNTER MEDICATION Take 650 mg by mouth daily. Plant Sterol Ester      . oxyCODONE (OXY IR/ROXICODONE) 5 MG immediate release tablet Take 3-4 tablets (15-20 mg total) by mouth every 6 (six) hours as needed for breakthrough pain (pain).  30 tablet  0  . polyethylene glycol (MIRALAX / GLYCOLAX) packet Take 17 g by mouth 2 (two) times daily.  14 each  0  . potassium gluconate 595 MG TABS tablet Take 595 mg by  mouth daily as needed (leg cramps).      . promethazine (PHENERGAN) 25 MG tablet Take 25 mg by mouth daily as  needed for nausea or vomiting.       Marland Kitchen rOPINIRole (REQUIP) 4 MG tablet Take 4 mg by mouth at bedtime.      . sitaGLIPtin (JANUVIA) 100 MG tablet Take 100 mg by mouth at bedtime.      Marland Kitchen tiZANidine (ZANAFLEX) 4 MG tablet Take 1 tablet (4 mg total) by mouth at bedtime.  30 tablet  0  . topiramate (TOPAMAX) 50 MG tablet Take 50 mg by mouth 2 (two) times daily as needed (headaches).       . traZODone (DESYREL) 100 MG tablet Take 100 mg by mouth at bedtime. Take 1 - 2 tablets at bedtime as needed.      Marland Kitchen UNABLE TO FIND C-Pap machine , use nightly       No current facility-administered medications on file prior to visit.    BP 122/67  Pulse 73  Temp(Src) 98.6 F (37 C) (Oral)  Resp 16  Ht 5' 5.25" (1.657 m)  Wt 247 lb 9.6 oz (112.311 kg)  BMI 40.91 kg/m2  SpO2 100%       Objective:   Physical Exam  Constitutional: She is oriented to person, place, and time. She appears well-developed and well-nourished. No distress.  HENT:  Head: Normocephalic and atraumatic.  Cardiovascular: Normal rate and regular rhythm.   No murmur heard. Pulmonary/Chest: Effort normal and breath sounds normal. No respiratory distress. She has no wheezes. She has no rales. She exhibits no tenderness.  Neurological: She is alert and oriented to person, place, and time.  Skin: Skin is dry.  Psychiatric: She has a normal mood and affect. Her behavior is normal. Judgment and thought content normal.          Assessment & Plan:

## 2014-08-30 NOTE — Progress Notes (Signed)
Pre visit review using our clinic review tool, if applicable. No additional management support is needed unless otherwise documented below in the visit note. 

## 2014-08-31 ENCOUNTER — Telehealth: Payer: Self-pay | Admitting: Family

## 2014-08-31 NOTE — Telephone Encounter (Signed)
Notified pt and she voices understanding. 

## 2014-08-31 NOTE — Telephone Encounter (Signed)
Amlodipine refilled per protocol. JG//CMA 

## 2014-08-31 NOTE — Telephone Encounter (Signed)
Lab shows mild iron deficiency. She should add mvi with minerals (including iron) once daily.

## 2014-09-11 IMAGING — CT CT CHEST W/ CM
2 of 3 series · 15 of 36 positions shown, 18 images · IV contrast (APPLIED)
Comparison: None.

CLINICAL DATA: Fever of unknown origin.

EXAM:
CT CHEST WITH CONTRAST
TECHNIQUE: Multidetector CT imaging of the chest was performed during
intravenous contrast administration.
CONTRAST:  80mL OMNIPAQUE IOHEXOL 300 MG/ML  SOLN

[Series 2: chest 5.0 b31f · axial · 0.79mm/px · z∈[-504,-238]mm · 12 of 63 slices shown, 15 images]
[im 5/63  mediastinal]
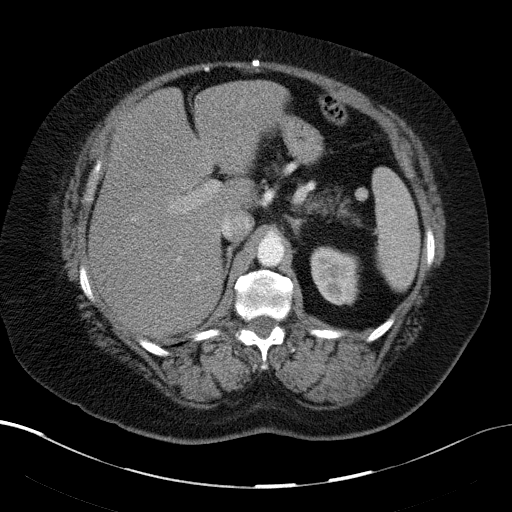
[im 5/63  lung]
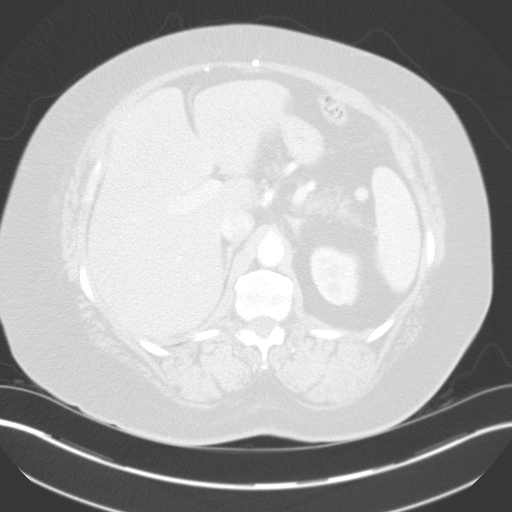
[im 10/63  lung]
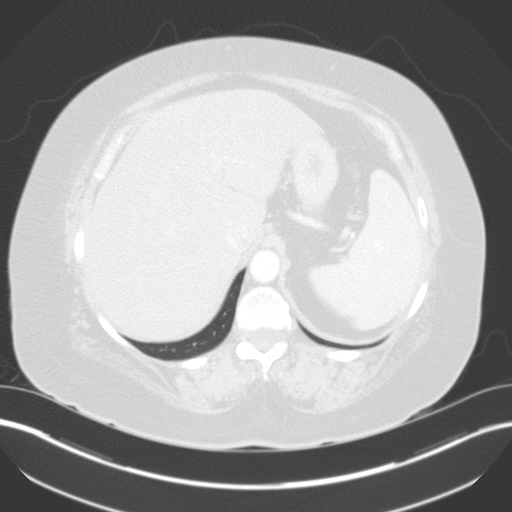
[im 14/63  lung]
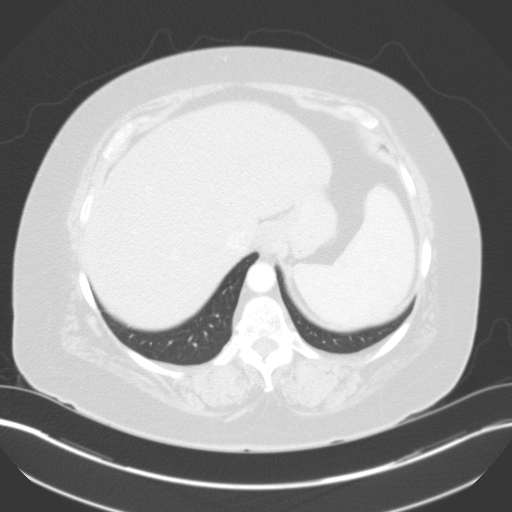
[im 19/63  lung]
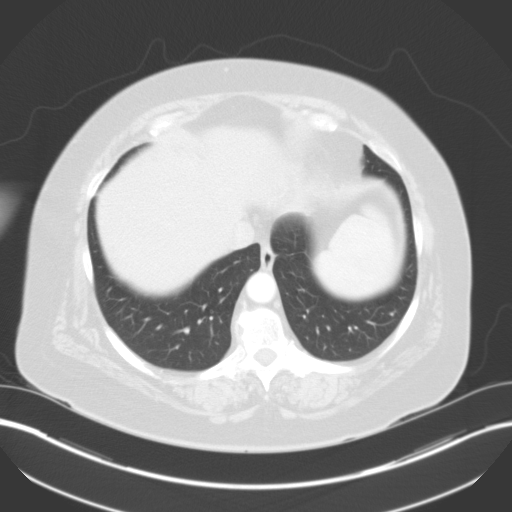
[im 23/63  mediastinal]
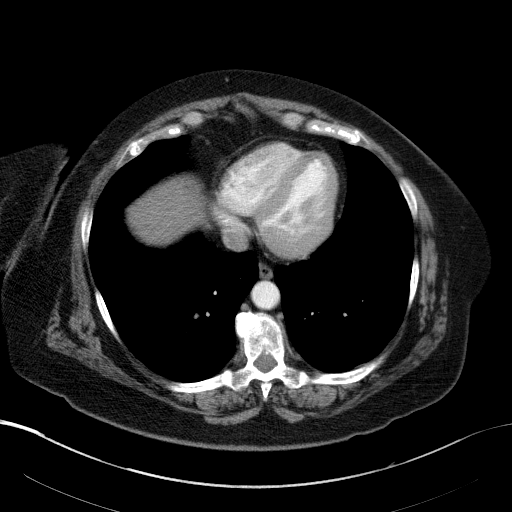
[im 23/63  lung]
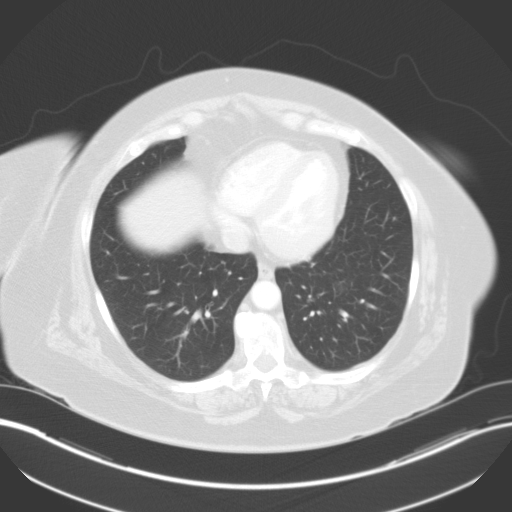
[im 28/63  lung]
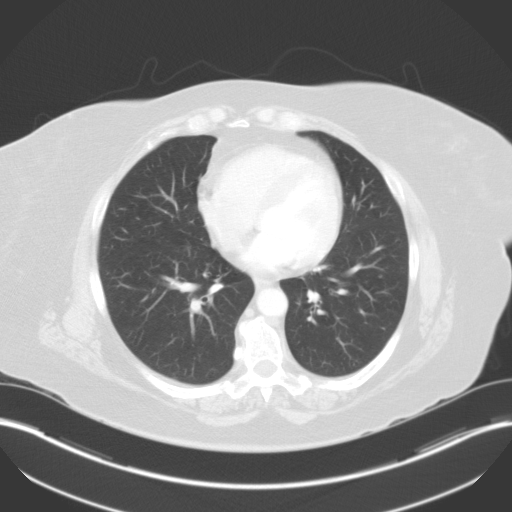
[im 35/63  lung]
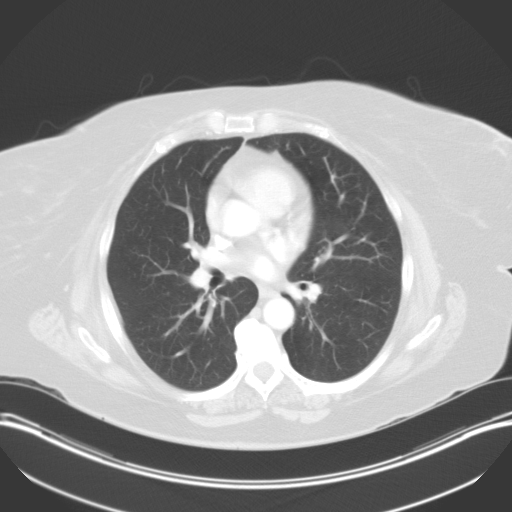
[im 40/63  lung]
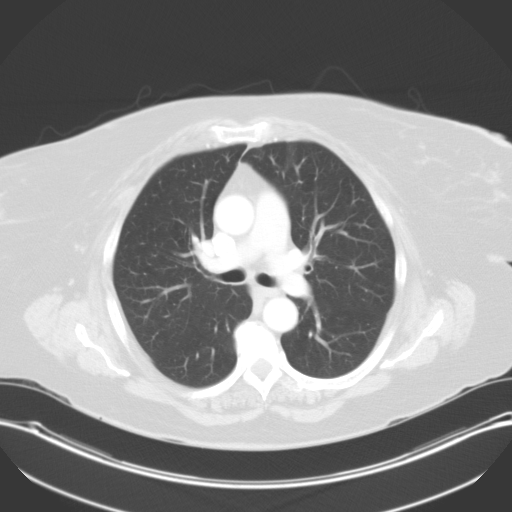
[im 44/63  mediastinal]
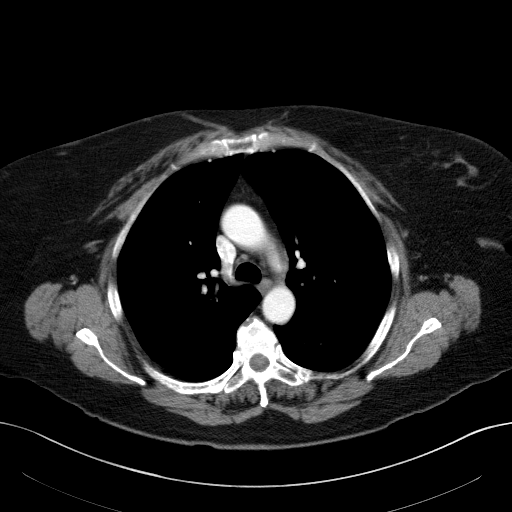
[im 44/63  lung]
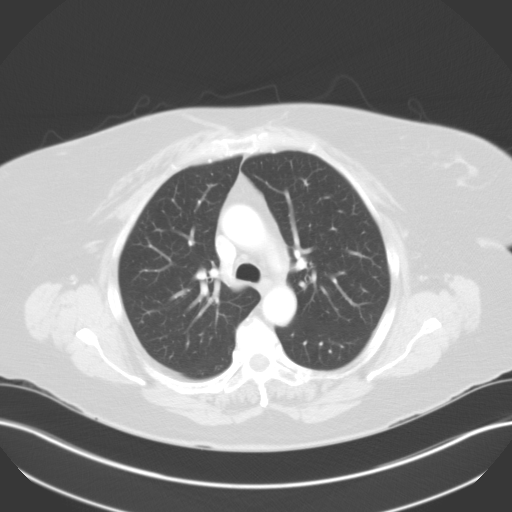
[im 49/63  lung]
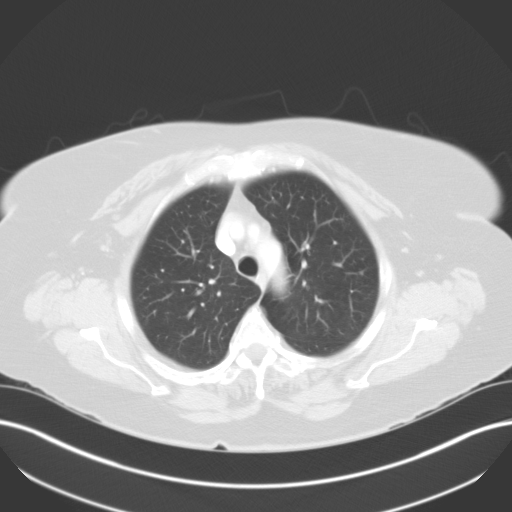
[im 53/63  lung]
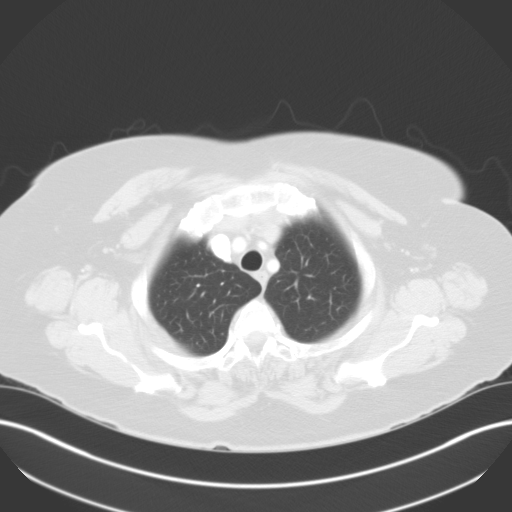
[im 58/63  lung]
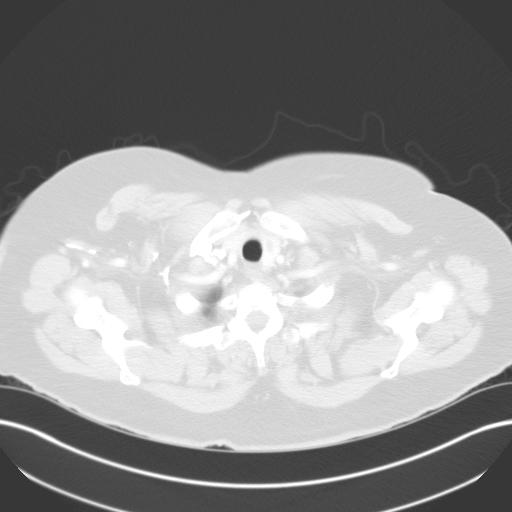

[Series 6: chest 3.0 coronal · coronal · 0.62mm/px · 3 of 107 slices shown]
[im 22/107  lung]
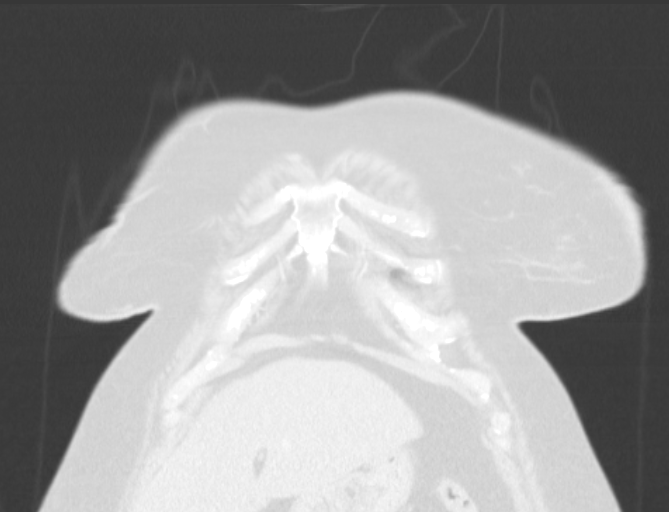
[im 43/107  lung]
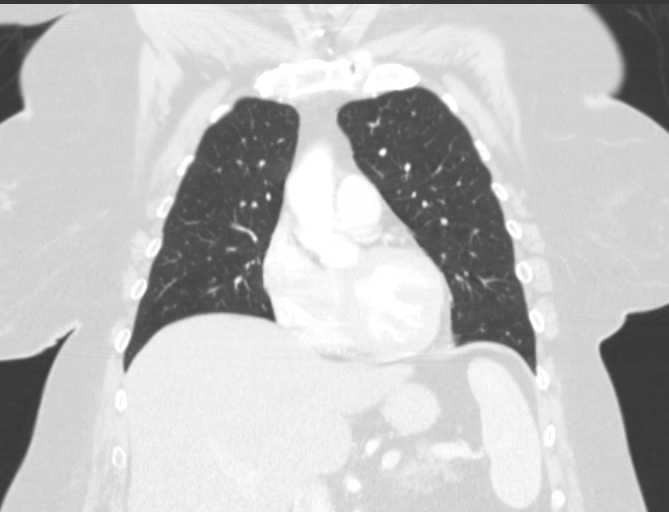
[im 64/107  lung]
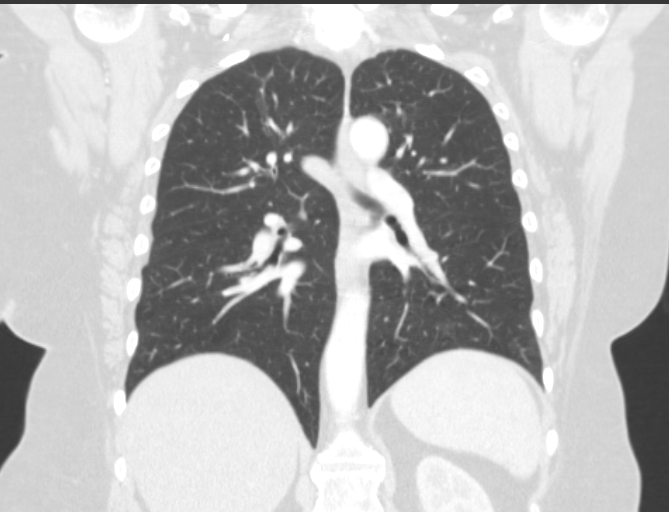

[15 of 36 positions shown; findings below may reference images not displayed]

FINDINGS: No evidence of mediastinal or hilar masses. No lymphadenopathy seen
elsewhere within the thorax. No evidence of pleural or pericardial
effusion. Tiny sub-cm low attenuation left thyroid nodule noted.

No evidence of pulmonary infiltrate or central endobronchial lesion.
No suspicious pulmonary nodules or masses are identified. No
evidence of chest wall mass or suspicious bone lesions. The adrenal
glands are normal in appearance.
IMPRESSION: Negative.  No pulmonary disease or lymphadenopathy identified.

## 2014-09-14 ENCOUNTER — Ambulatory Visit (INDEPENDENT_AMBULATORY_CARE_PROVIDER_SITE_OTHER): Payer: PRIVATE HEALTH INSURANCE | Admitting: Family

## 2014-09-14 ENCOUNTER — Encounter: Payer: Self-pay | Admitting: Family

## 2014-09-14 VITALS — BP 160/90 | HR 87 | Temp 97.6°F | Resp 16 | Ht 65.25 in | Wt 241.8 lb

## 2014-09-14 DIAGNOSIS — K219 Gastro-esophageal reflux disease without esophagitis: Secondary | ICD-10-CM

## 2014-09-14 DIAGNOSIS — I10 Essential (primary) hypertension: Secondary | ICD-10-CM

## 2014-09-14 DIAGNOSIS — IMO0002 Reserved for concepts with insufficient information to code with codable children: Secondary | ICD-10-CM

## 2014-09-14 DIAGNOSIS — E1165 Type 2 diabetes mellitus with hyperglycemia: Secondary | ICD-10-CM

## 2014-09-14 DIAGNOSIS — E785 Hyperlipidemia, unspecified: Secondary | ICD-10-CM

## 2014-09-14 DIAGNOSIS — E119 Type 2 diabetes mellitus without complications: Secondary | ICD-10-CM

## 2014-09-14 LAB — LIPID PANEL
CHOL/HDL RATIO: 4
CHOLESTEROL: 207 mg/dL — AB (ref 0–200)
HDL: 48.3 mg/dL (ref >39.00)
LDL CALC: 132 mg/dL — AB (ref 0–99)
NonHDL: 158.7
Triglycerides: 133 mg/dL (ref 0.0–149.0)
VLDL: 26.6 mg/dL (ref 0.0–40.0)

## 2014-09-14 LAB — HEMOGLOBIN A1C: Hgb A1c MFr Bld: 6.2 % (ref 4.6–6.5)

## 2014-09-14 MED ORDER — ESOMEPRAZOLE MAGNESIUM 40 MG PO CPDR: 40.0000 mg | DELAYED_RELEASE_CAPSULE | Freq: Every day | ORAL | Status: DC

## 2014-09-14 NOTE — Patient Instructions (Addendum)
Please complete lab work prior to leaving. Follow up in 3 months.  

## 2014-09-14 NOTE — Assessment & Plan Note (Signed)
Stable, rx sent for generic nexium.

## 2014-09-14 NOTE — Assessment & Plan Note (Signed)
We discussed healthy diet, discussed trying to have her groceries delivered to her home.  She is trying to move out of her current apt and move to a single level home without stairs. Obtain A1C. Continue metformin.

## 2014-09-14 NOTE — Progress Notes (Signed)
Subjective:    Patient ID: [name], female    DOB: [birth year], 57 y.o.   MRN: [id number]  HPI  [name] is a 58 yr old female who presents today for follow up of multiple medical problems:  GERD- her insurance will no longer cover nexium after 1/16. They will cover esomeprezole generic rx.  HTN- follow up manual BP check 120/80. BP Readings from Last 3 Encounters:  09/14/14 160/90  08/30/14 122/67  08/09/14 116/62   DM2- she is not on a statin. Reports that her diet is poor.  Limited by trouble carrying groceries up the stairs. Lab Results  Component Value Date   HGBA1C 6.9* 06/08/2014   HGBA1C 6.8* 02/09/2014   HGBA1C 7.9* 11/02/2013   Lab Results  Component Value Date   MICROALBUR 0.57 02/09/2014   LDLCALC 111* 02/09/2014   CREATININE 0.75 08/09/2014     Review of Systems See HPI  Past Medical History  Diagnosis Date  . Diverticulosis   . Restless leg syndrome   . IBS (irritable bowel syndrome)   . IC (interstitial cystitis)   . Hypertension   . Diabetes mellitus     diet controlled  . Depression   . Fibromyalgia   . Anxiety   . Osteopenia   . Arthritis     in knees  . Dyslipidemia   . Chronic fatigue syndrome   . Chronic abdominal pain   . Esophageal reflux   . TIA (transient ischemic attack) 08/22/2012  . Obesity   . Sleep apnea     History   Social History  . Marital Status: Divorced    Spouse Name: N/A    Number of Children: 2  . Years of Education: N/A   Occupational History  . disabled    Social History Main Topics  . Smoking status: Former Smoker -- 0.10 packs/day for 1 years    Types: Cigarettes    Quit date: 10/19/2012  . Smokeless tobacco: Never Used     Comment: using electronic cigarettes  . Alcohol Use: Yes     Comment: 2 drinks per month  . Drug Use: No  . Sexual Activity: Not on file   Other Topics Concern  . Not on file   Social History Narrative   Disabled   Divorced, lives alone - 1 dtr, 1 son    Past  Surgical History  Procedure Laterality Date  . Total knee arthroplasty      bilateral  . Tonsillectomy and adenoidectomy    . Breast cyst excision  1978    left  . Laparotomy  1982    cyst removed from right ovary and removal of adhesions  . Abdominal exploration surgery  1983    diverticulum removed  . Appendectomy  1983    with exploration  . Laparoscopic incisional / umbilical / ventral hernia repair  1991, 2001, 2007    x 3  . Vaginal hysterectomy  1992  . Laparotomy  1991  . Scalp surgery  1993, 1998    Removal of tumor  . Nasal sinus surgery  2004  . Rectocele repair  2009  . Cervical fusion  2010  . Bladder hydrodistention      Family History  Problem Relation Age of Onset  . Breast cancer Mother   . Dementia Mother   . Breast cancer Maternal Grandmother   . Throat cancer Maternal Grandmother   . Esophageal cancer Maternal Grandmother   . Breast cancer Other  maternal great aunts x 5  . Rheum arthritis Other   . Colon cancer Neg Hx   . Diabetes Father   . Dementia Father   . Diabetes Maternal Grandfather     Allergies  Allergen Reactions  . Chlorzoxazone Other (See Comments)    Unknown reaction  . Gabapentin Nausea And Vomiting  . Gemfibrozil Nausea And Vomiting  . Metronidazole Nausea And Vomiting  . Morphine And Related Nausea And Vomiting  . Nitrofurantoin Nausea And Vomiting  . Olmesartan Medoxomil-Hctz Nausea And Vomiting  . Ondansetron Nausea And Vomiting  . Pentosan Polysulfate Sodium Other (See Comments)    Unknown reaction  . Propoxyphene N-Acetaminophen Nausea And Vomiting  . Ramipril Cough  . Sulfonamide Derivatives Nausea And Vomiting    Current Outpatient Prescriptions on File Prior to Visit  Medication Sig Dispense Refill  . ALPRAZolam (XANAX) 1 MG tablet Take 1 tablet (1 mg total) by mouth 3 (three) times daily as needed for anxiety.  30 tablet  0  . amLODipine (NORVASC) 10 MG tablet Take 10 mg by mouth at bedtime.      Marland Kitchen aspirin  EC 325 MG tablet Take 325 mg by mouth daily.      Marland Kitchen desvenlafaxine (PRISTIQ) 50 MG 24 hr tablet Take 100 mg by mouth daily.       . Diclofenac Sodium (PENNSAID) 2 % SOLN Place 1 application onto the skin at bedtime as needed (knee pain).      Marland Kitchen dicyclomine (BENTYL) 20 MG tablet Take 20 mg by mouth 3 (three) times daily as needed for spasms.      . fentaNYL (DURAGESIC) 100 MCG/HR Place 1 patch (100 mcg total) onto the skin every other day.  5 patch  0  . furosemide (LASIX) 20 MG tablet Take 20 mg by mouth every other day.      Marland Kitchen glucose blood (ONE TOUCH ULTRA TEST) test strip Use as instructed. Dx: 250.00  100 each  6  . lidocaine (LIDODERM) 5 % Place 2 patches onto the skin at bedtime as needed (hip pain).       Marland Kitchen losartan (COZAAR) 100 MG tablet Take 100 mg by mouth at bedtime.      . metFORMIN (GLUCOPHAGE-XR) 500 MG 24 hr tablet Take 2,000 mg by mouth daily with breakfast.      . mirabegron ER (MYRBETRIQ) 50 MG TB24 Take 50 mg by mouth at bedtime.       Marland Kitchen nystatin (MYCOSTATIN/NYSTOP) 100000 UNIT/GM POWD APPLY TOPICALLY 2 (TWO) TIMES DAILY.  15 g  0  . Omega-3 Fatty Acids (FISH OIL) 1000 MG CAPS Take 1,000 mg by mouth daily.      Marland Kitchen OVER THE COUNTER MEDICATION Take 650 mg by mouth daily. Plant Sterol Ester      . oxyCODONE (OXY IR/ROXICODONE) 5 MG immediate release tablet Take 3-4 tablets (15-20 mg total) by mouth every 6 (six) hours as needed for breakthrough pain (pain).  30 tablet  0  . polyethylene glycol (MIRALAX / GLYCOLAX) packet Take 17 g by mouth 2 (two) times daily.  14 each  0  . potassium gluconate 595 MG TABS tablet Take 595 mg by mouth daily as needed (leg cramps).      . promethazine (PHENERGAN) 25 MG tablet Take 25 mg by mouth daily as needed for nausea or vomiting.       Marland Kitchen rOPINIRole (REQUIP) 4 MG tablet Take 4 mg by mouth at bedtime.      . sitaGLIPtin (JANUVIA)  100 MG tablet Take 100 mg by mouth at bedtime.      Marland Kitchen tiZANidine (ZANAFLEX) 4 MG tablet Take 1 tablet (4 mg total) by  mouth at bedtime.  30 tablet  0  . topiramate (TOPAMAX) 50 MG tablet Take 50 mg by mouth 2 (two) times daily as needed (headaches).       . traZODone (DESYREL) 100 MG tablet Take 100 mg by mouth at bedtime. Take 1 - 2 tablets at bedtime as needed.      Marland Kitchen UNABLE TO FIND C-Pap machine , use nightly       No current facility-administered medications on file prior to visit.    BP 160/90  Pulse 87  Temp(Src) 97.6 F (36.4 C) (Oral)  Resp 16  Ht 5' 5.25" (1.657 m)  Wt 241 lb 12.8 oz (109.68 kg)  BMI 39.95 kg/m2  SpO2 97%       Objective:   Physical Exam  Constitutional: She is oriented to person, place, and time. She appears well-developed and well-nourished. No distress.  HENT:  Head: Normocephalic and atraumatic.  Cardiovascular: Normal rate and regular rhythm.   No murmur heard. Pulmonary/Chest: Effort normal and breath sounds normal. No respiratory distress. She has no wheezes. She has no rales. She exhibits no tenderness.  Abdominal: Soft. Bowel sounds are normal. She exhibits no distension.  Neurological: She is alert and oriented to person, place, and time.  Skin: Skin is warm and dry.  Psychiatric: She has a normal mood and affect. Her behavior is normal. Judgment and thought content normal.          Assessment & Plan:  The patient is requesting rx for aquatic therapy.  Rx provided. This is at the recommendation of her pain management specialist who she has not been able to contact.

## 2014-09-14 NOTE — Progress Notes (Signed)
Pre visit review using our clinic review tool, if applicable. No additional management support is needed unless otherwise documented below in the visit note. 

## 2014-09-14 NOTE — Assessment & Plan Note (Signed)
Obtain follow up lipid panel.

## 2014-09-14 NOTE — Assessment & Plan Note (Signed)
BP stable on current meds. Continue same.  

## 2014-09-15 ENCOUNTER — Other Ambulatory Visit: Payer: Self-pay | Admitting: Family

## 2014-09-16 ENCOUNTER — Other Ambulatory Visit: Payer: Self-pay | Admitting: Family

## 2014-09-16 DIAGNOSIS — E785 Hyperlipidemia, unspecified: Secondary | ICD-10-CM

## 2014-09-16 MED ORDER — ATORVASTATIN CALCIUM 10 MG PO TABS: 10.0000 mg | ORAL_TABLET | Freq: Every day | ORAL | Status: DC

## 2014-09-16 NOTE — Telephone Encounter (Signed)
Called and spoke with the pt and informed her of recent lab results and note below.   Pt understood and agreed.  Future Lipid panel ordered and sent.  Rx sent to the pharmacy by e-script.//AB/CMA

## 2014-09-16 NOTE — Telephone Encounter (Signed)
Sugar control has improved. Cholesterol remains above goal and has gone up since the last time that we checked. I would recommend that the patient start a low dose statin if she is willing. If it worsens her intersitial cystitis symptoms we can discuss discontinuing (I know that she was concerned about this).  However, I think that it is important for her to decrease risk for heart disease.  If she starts statin (pended below), she will need to repeat flp in 3 months, dx hyperlipidemia.

## 2014-09-23 ENCOUNTER — Institutional Professional Consult (permissible substitution): Payer: PRIVATE HEALTH INSURANCE | Admitting: Pulmonary Disease

## 2014-10-04 ENCOUNTER — Encounter: Payer: Self-pay | Admitting: Family

## 2014-10-21 ENCOUNTER — Other Ambulatory Visit: Payer: Self-pay | Admitting: Family

## 2014-10-21 NOTE — Telephone Encounter (Signed)
Requesting Nystatin 100000 units-Apply Topicallly 2 times daily. Last refill:08/23/14';#15g,0 Last OV:09/14/14 Please advise.//AB/CMA

## 2014-10-22 ENCOUNTER — Telehealth: Payer: Self-pay

## 2014-10-22 NOTE — Telephone Encounter (Signed)
[name] [phone]  [name] called to see if she could get a letter stating that she needs a bottom floor due to her health, for [employer].

## 2014-10-22 NOTE — Telephone Encounter (Signed)
Attempted to reach pt and left detailed message to call us back with additional information. Is she requesting floor be replaced throughout apt or just certain area and how is current situation affecting her health?  Also left message that pt may send response through mychart as well.

## 2014-10-22 NOTE — Telephone Encounter (Signed)
  Sorry -- [name] needs a bottom floor apartment due to her health, at this time she is having to go up 20 some stairs to get to her apartment.

## 2014-10-24 ENCOUNTER — Encounter: Payer: Self-pay | Admitting: Family

## 2014-10-24 NOTE — Telephone Encounter (Signed)
See letter.

## 2014-10-26 NOTE — Telephone Encounter (Signed)
Letter mailed to patient.

## 2014-10-29 ENCOUNTER — Ambulatory Visit: Payer: PRIVATE HEALTH INSURANCE | Admitting: Family

## 2014-11-04 ENCOUNTER — Institutional Professional Consult (permissible substitution): Payer: Medicare Other | Admitting: Pulmonary Disease

## 2014-11-17 ENCOUNTER — Other Ambulatory Visit: Payer: Self-pay | Admitting: Family

## 2014-11-18 ENCOUNTER — Other Ambulatory Visit: Payer: Self-pay | Admitting: Family

## 2014-12-08 ENCOUNTER — Other Ambulatory Visit: Payer: Self-pay | Admitting: Family

## 2014-12-15 ENCOUNTER — Other Ambulatory Visit: Payer: Self-pay | Admitting: Family

## 2014-12-15 NOTE — Telephone Encounter (Signed)
Rx request to pharmacy/SLS  

## 2014-12-19 ENCOUNTER — Other Ambulatory Visit: Payer: Self-pay | Admitting: Family

## 2014-12-20 NOTE — Telephone Encounter (Signed)
Rx request to pharmacy/SLS  

## 2014-12-28 ENCOUNTER — Ambulatory Visit: Payer: PRIVATE HEALTH INSURANCE | Admitting: Family

## 2015-01-13 ENCOUNTER — Ambulatory Visit: Payer: Medicaid Other | Admitting: Family

## 2015-01-14 ENCOUNTER — Ambulatory Visit: Payer: Medicaid Other | Admitting: Family

## 2015-01-19 ENCOUNTER — Ambulatory Visit (INDEPENDENT_AMBULATORY_CARE_PROVIDER_SITE_OTHER): Payer: Medicare Other | Admitting: Family

## 2015-01-19 ENCOUNTER — Other Ambulatory Visit (HOSPITAL_COMMUNITY)
Admission: RE | Admit: 2015-01-19 | Discharge: 2015-01-19 | Disposition: A | Discharge status: Home / Self Care | Payer: Medicare Other | Source: Ambulatory Visit | Attending: Family | Admitting: Family

## 2015-01-19 ENCOUNTER — Encounter: Payer: Self-pay | Admitting: Family

## 2015-01-19 VITALS — BP 130/80 | HR 82 | Temp 98.2°F | Resp 18 | Ht 65.25 in | Wt 246.0 lb

## 2015-01-19 DIAGNOSIS — E1165 Type 2 diabetes mellitus with hyperglycemia: Secondary | ICD-10-CM | POA: Diagnosis not present

## 2015-01-19 DIAGNOSIS — N760 Acute vaginitis: Secondary | ICD-10-CM | POA: Diagnosis present

## 2015-01-19 DIAGNOSIS — F32A Depression, unspecified: Secondary | ICD-10-CM

## 2015-01-19 DIAGNOSIS — E119 Type 2 diabetes mellitus without complications: Secondary | ICD-10-CM

## 2015-01-19 DIAGNOSIS — IMO0002 Reserved for concepts with insufficient information to code with codable children: Secondary | ICD-10-CM

## 2015-01-19 DIAGNOSIS — F329 Major depressive disorder, single episode, unspecified: Secondary | ICD-10-CM

## 2015-01-19 DIAGNOSIS — L304 Erythema intertrigo: Secondary | ICD-10-CM

## 2015-01-19 DIAGNOSIS — R0789 Other chest pain: Secondary | ICD-10-CM

## 2015-01-19 DIAGNOSIS — N898 Other specified noninflammatory disorders of vagina: Secondary | ICD-10-CM

## 2015-01-19 DIAGNOSIS — L298 Other pruritus: Secondary | ICD-10-CM

## 2015-01-19 DIAGNOSIS — E785 Hyperlipidemia, unspecified: Secondary | ICD-10-CM

## 2015-01-19 MED ORDER — PRAVASTATIN SODIUM 20 MG PO TABS: 20.0000 mg | ORAL_TABLET | Freq: Every day | ORAL | Status: DC

## 2015-01-19 MED ORDER — NYSTATIN 100000 UNIT/GM EX CREA: 1.0000 "application " | TOPICAL_CREAM | Freq: Two times a day (BID) | CUTANEOUS | Status: DC

## 2015-01-19 NOTE — Patient Instructions (Addendum)
Please complete your lab work prior to leaving. Follow up in 3 months, sooner if problems/concerns.  Go to the ER if you develop recurrent chest pain.

## 2015-01-19 NOTE — Progress Notes (Signed)
Pre visit review using our clinic review tool, if applicable. No additional management support is needed unless otherwise documented below in the visit note. 

## 2015-01-19 NOTE — Progress Notes (Signed)
Subjective:    Patient ID: [name], female    DOB: [birth year], 59 y.o.   MRN: [id number]   HPI   Pt is currently maintained on the following medications for diabetes: Januvia/metformin Last A1C was:  Lab Results  Component Value Date   HGBA1C 6.2 09/14/2014  Last diabetic eye exam was- 6 mos ago told early cataracts.  Denies polyuria/polydipsia Denies hypoglycemia Not doing well with diet.  Home glucose readings range 157-215 post prandial  HTN- Patient is currently maintained on the following medications for blood pressure: amlodipine/losartan Patient reports good compliance with blood pressure medications. Reports CP and SOB when climbing her steps. She reports that it occurred several days ago.  The following day she had mild chest pain.  No chest pain since.    Last 3 blood pressure readings in our office are as follows: BP Readings from Last 3 Encounters:  01/19/15 130/80  09/14/14 160/90  08/30/14 122/67   Depression- managed by psych- recently began rexulti.  Feeling better.  Lipid- Patient is currently maintained on the following medication for hyperlipidemia: atorvastatin- stopped because of body aches and worsened IC.  Last lipid panel as follows: Lab Results  Component Value Date   CHOL 207* 09/14/2014   HDL 48.30 09/14/2014   LDLCALC 132* 09/14/2014   LDLDIRECT 136.5 10/11/2009   TRIG 133.0 09/14/2014   CHOLHDL 4 09/14/2014    Patient denies myalgia. Patient reports good compliance with low fat/low cholesterol diet.   Anemia-  Lab Results  Component Value Date   WBC 5.4 08/30/2014   HGB 13.0 08/30/2014   HCT 39.9 08/30/2014   MCV 84.9 08/30/2014   PLT 179.0 08/30/2014   Breast rash- tried nystatin powder. + vaginal itching following abx.   Review of Systems See HPI  Past Medical History  Diagnosis Date  . Diverticulosis   . Restless leg syndrome   . IBS (irritable bowel syndrome)   . IC (interstitial cystitis)   . Hypertension     . Diabetes mellitus     diet controlled  . Depression   . Fibromyalgia   . Anxiety   . Osteopenia   . Arthritis     in knees  . Dyslipidemia   . Chronic fatigue syndrome   . Chronic abdominal pain   . Esophageal reflux   . TIA (transient ischemic attack) 08/22/2012  . Obesity   . Sleep apnea     History   Social History  . Marital Status: Divorced    Spouse Name: N/A  . Number of Children: 2  . Years of Education: N/A   Occupational History  . disabled    Social History Main Topics  . Smoking status: Former Smoker -- 0.10 packs/day for 1 years    Types: Cigarettes    Quit date: 10/19/2012  . Smokeless tobacco: Never Used     Comment: using electronic cigarettes  . Alcohol Use: Yes     Comment: 2 drinks per month  . Drug Use: No  . Sexual Activity: Not on file   Other Topics Concern  . Not on file   Social History Narrative   Disabled   Divorced, lives alone - 1 dtr, 1 son    Past Surgical History  Procedure Laterality Date  . Total knee arthroplasty      bilateral  . Tonsillectomy and adenoidectomy    . Breast cyst excision  1978    left  . Laparotomy  1982    cyst  removed from right ovary and removal of adhesions  . Abdominal exploration surgery  1983    diverticulum removed  . Appendectomy  1983    with exploration  . Laparoscopic incisional / umbilical / ventral hernia repair  1991, 2001, 2007    x 3  . Vaginal hysterectomy  1992  . Laparotomy  1991  . Scalp surgery  1993, 1998    Removal of tumor  . Nasal sinus surgery  2004  . Rectocele repair  2009  . Cervical fusion  2010  . Bladder hydrodistention  12/24/14    Family History  Problem Relation Age of Onset  . Breast cancer Mother   . Dementia Mother   . Breast cancer Maternal Grandmother   . Throat cancer Maternal Grandmother   . Esophageal cancer Maternal Grandmother   . Breast cancer Other     maternal great aunts x 5  . Rheum arthritis Other   . Colon cancer Neg Hx   .  Diabetes Father   . Dementia Father   . Diabetes Maternal Grandfather     Allergies  Allergen Reactions  . Atorvastatin     Body aches, worsened interstitial cystitis.  . Chlorzoxazone Other (See Comments)    Unknown reaction  . Gabapentin Nausea And Vomiting  . Gemfibrozil Nausea And Vomiting  . Metronidazole Nausea And Vomiting  . Morphine And Related Nausea And Vomiting  . Nitrofurantoin Nausea And Vomiting  . Olmesartan Medoxomil-Hctz Nausea And Vomiting  . Ondansetron Nausea And Vomiting  . Pentosan Polysulfate Sodium Other (See Comments)    Unknown reaction  . Propoxyphene N-Acetaminophen Nausea And Vomiting  . Ramipril Cough  . Sulfonamide Derivatives Nausea And Vomiting    Current Outpatient Prescriptions on File Prior to Visit  Medication Sig Dispense Refill  . ALPRAZolam (XANAX) 1 MG tablet Take 1 tablet (1 mg total) by mouth 3 (three) times daily as needed for anxiety. 30 tablet 0  . amLODipine (NORVASC) 10 MG tablet Take 10 mg by mouth at bedtime.    Marland Kitchen aspirin EC 325 MG tablet Take 325 mg by mouth daily.    Marland Kitchen desvenlafaxine (PRISTIQ) 50 MG 24 hr tablet Take 100 mg by mouth daily.     . Diclofenac Sodium (PENNSAID) 2 % SOLN Place 1 application onto the skin at bedtime as needed (knee pain).    Marland Kitchen dicyclomine (BENTYL) 20 MG tablet Take 20 mg by mouth 3 (three) times daily as needed for spasms.    . fentaNYL (DURAGESIC) 100 MCG/HR Place 1 patch (100 mcg total) onto the skin every other day. 5 patch 0  . ferrous sulfate 325 (65 FE) MG tablet Take 325 mg by mouth daily with breakfast.    . furosemide (LASIX) 20 MG tablet TAKE ONE TABLET BY MOUTH DAILY 30 tablet 5  . glucose blood (ONE TOUCH ULTRA TEST) test strip Use as instructed. Dx: 250.00 100 each 6  . JANUVIA 100 MG tablet TAKE 1 TABLET BY MOUTH AT BEDTIME 30 tablet 2  . lidocaine (LIDODERM) 5 % Place 2 patches onto the skin at bedtime as needed (hip pain).     Marland Kitchen losartan (COZAAR) 100 MG tablet TAKE 1 TABLET (100 MG  TOTAL) BY MOUTH DAILY. 30 tablet 3  . metFORMIN (GLUCOPHAGE-XR) 500 MG 24 hr tablet TAKE 4 TABLETS (2,000 MG TOTAL) BY MOUTH DAILY WITH BREAKFAST. 120 tablet 1  . mirabegron ER (MYRBETRIQ) 50 MG TB24 Take 50 mg by mouth at bedtime.     Marland Kitchen  NEXIUM 40 MG capsule TAKE 1 CAPSULE (40 MG TOTAL) BY MOUTH DAILY AT 12 NOON. 30 capsule 2  . nystatin (MYCOSTATIN/NYSTOP) 100000 UNIT/GM POWD APPLY TOPICALLY 2 (TWO) TIMES DAILY. 15 g 0  . Omega-3 Fatty Acids (FISH OIL) 1000 MG CAPS Take 1,000 mg by mouth daily.    Marland Kitchen oxyCODONE (OXY IR/ROXICODONE) 5 MG immediate release tablet Take 3-4 tablets (15-20 mg total) by mouth every 6 (six) hours as needed for breakthrough pain (pain). (Patient taking differently: Take 10 mg by mouth every 8 (eight) hours as needed for breakthrough pain (pain). ) 30 tablet 0  . polyethylene glycol (MIRALAX / GLYCOLAX) packet Take 17 g by mouth 2 (two) times daily. 14 each 0  . potassium gluconate 595 MG TABS tablet Take 595 mg by mouth daily as needed (leg cramps).    . promethazine (PHENERGAN) 25 MG tablet Take 25 mg by mouth daily as needed for nausea or vomiting.     Marland Kitchen rOPINIRole (REQUIP) 4 MG tablet TAKE 1 TABLET BY MOUTH AT BEDTIME 30 tablet 2  . tiZANidine (ZANAFLEX) 4 MG tablet Take 1 tablet (4 mg total) by mouth at bedtime. 30 tablet 0  . topiramate (TOPAMAX) 50 MG tablet Take 50 mg by mouth 2 (two) times daily as needed (headaches).     . traZODone (DESYREL) 100 MG tablet Take 100 mg by mouth at bedtime. Take 1 - 2 tablets at bedtime as needed.    Marland Kitchen UNABLE TO FIND C-Pap machine , use nightly     No current facility-administered medications on file prior to visit.    BP 130/80 mmHg  Pulse 82  Temp(Src) 98.2 F (36.8 C) (Oral)  Resp 18  Ht 5' 5.25" (1.657 m)  Wt 246 lb (111.585 kg)  BMI 40.64 kg/m2  SpO2 96%       Objective:   Physical Exam  Constitutional: She appears well-developed and well-nourished.  Cardiovascular: Normal rate, regular rhythm and normal heart  sounds.   No murmur heard. Pulmonary/Chest: Effort normal and breath sounds normal. No respiratory distress. She has no wheezes.  Skin:  Mild intertrigo, beneath breasts, bilateral groin.  Psychiatric: She has a normal mood and affect. Her behavior is normal. Judgment and thought content normal.          Assessment & Plan:

## 2015-01-20 LAB — BASIC METABOLIC PANEL
BUN: 20 mg/dL (ref 6–23)
CALCIUM: 9.7 mg/dL (ref 8.4–10.5)
CHLORIDE: 102 meq/L (ref 96–112)
CO2: 28 meq/L (ref 19–32)
Creatinine, Ser: 0.81 mg/dL (ref 0.40–1.20)
GFR: 77.31 mL/min (ref >60.00)
GLUCOSE: 94 mg/dL (ref 70–99)
Potassium: 4.1 mEq/L (ref 3.5–5.1)
Sodium: 136 mEq/L (ref 135–145)

## 2015-01-20 LAB — HEMOGLOBIN A1C: Hgb A1c MFr Bld: 6.5 % (ref 4.6–6.5)

## 2015-01-21 ENCOUNTER — Encounter: Payer: Self-pay | Admitting: Family

## 2015-01-21 LAB — CERVICOVAGINAL ANCILLARY ONLY
WET PREP (BD AFFIRM): NEGATIVE
WET PREP (BD AFFIRM): NEGATIVE
Wet Prep (BD Affirm): NEGATIVE

## 2015-01-22 ENCOUNTER — Other Ambulatory Visit: Payer: Self-pay | Admitting: Family

## 2015-01-23 DIAGNOSIS — R0789 Other chest pain: Secondary | ICD-10-CM | POA: Insufficient documentation

## 2015-01-23 NOTE — Assessment & Plan Note (Signed)
Lab Results  Component Value Date   HGBA1C 6.5 01/19/2015   Stable, continue januvia and metformin.

## 2015-01-23 NOTE — Assessment & Plan Note (Signed)
Uncontrolled Will rx with nystatin ointment rather than powder.

## 2015-01-23 NOTE — Assessment & Plan Note (Signed)
Improving,managed by psychiatry.

## 2015-01-23 NOTE — Assessment & Plan Note (Signed)
Lab Results  Component Value Date   CHOL 207* 09/14/2014   HDL 48.30 09/14/2014   LDLCALC 132* 09/14/2014   LDLDIRECT 136.5 10/11/2009   TRIG 133.0 09/14/2014   CHOLHDL 4 09/14/2014   Uncontrolled, trial of pravastatin to see if she can tolerate better.

## 2015-01-23 NOTE — Assessment & Plan Note (Signed)
Resolved, previous hx of same with normal myoview spring 2014.  EKG is performed today and personally reviewed and notes no sign of ischemia.  She is advised to go to the ED if recurrent chest pain.

## 2015-01-25 ENCOUNTER — Other Ambulatory Visit: Payer: Self-pay | Admitting: Family

## 2015-01-25 DIAGNOSIS — Z1231 Encounter for screening mammogram for malignant neoplasm of breast: Secondary | ICD-10-CM

## 2015-01-29 ENCOUNTER — Other Ambulatory Visit: Payer: Self-pay | Admitting: Family

## 2015-01-31 NOTE — Telephone Encounter (Signed)
Rx request to pharmacy/SLS  

## 2015-02-02 ENCOUNTER — Other Ambulatory Visit: Payer: Self-pay | Admitting: Family

## 2015-02-08 ENCOUNTER — Ambulatory Visit (HOSPITAL_BASED_OUTPATIENT_CLINIC_OR_DEPARTMENT_OTHER): Payer: Medicare Other

## 2015-02-22 LAB — HM DIABETES EYE EXAM

## 2015-02-28 ENCOUNTER — Ambulatory Visit (HOSPITAL_BASED_OUTPATIENT_CLINIC_OR_DEPARTMENT_OTHER): Payer: Medicare Other

## 2015-03-02 ENCOUNTER — Telehealth: Payer: Self-pay | Admitting: *Deleted

## 2015-03-02 MED ORDER — AMLODIPINE BESYLATE 10 MG PO TABS: 10.0000 mg | ORAL_TABLET | Freq: Every day | ORAL | Status: DC

## 2015-03-02 NOTE — Telephone Encounter (Signed)
Received fax from [facility], [facility] requesting amlodipine 10mg  daily.

## 2015-03-16 ENCOUNTER — Telehealth: Payer: Self-pay | Admitting: Family

## 2015-03-16 ENCOUNTER — Telehealth: Payer: Self-pay | Admitting: *Deleted

## 2015-03-16 MED ORDER — SITAGLIPTIN PHOSPHATE 100 MG PO TABS: 100.0000 mg | ORAL_TABLET | Freq: Every day | ORAL | Status: DC

## 2015-03-16 MED ORDER — ROPINIROLE HCL 4 MG PO TABS: 4.0000 mg | ORAL_TABLET | Freq: Every day | ORAL | Status: DC

## 2015-03-16 NOTE — Telephone Encounter (Signed)
Caller name: diane at [facility] in [place] Relation to pt: Call back number: [phone] Pharmacy:  Reason for call:   Requesting rOPINIRole (REQUIP) refill for patient

## 2015-03-16 NOTE — Telephone Encounter (Signed)
Received fax from [facility] for [place]. Refill sent.

## 2015-03-16 NOTE — Telephone Encounter (Signed)
Refill sent.

## 2015-03-17 ENCOUNTER — Encounter: Payer: Self-pay | Admitting: Family

## 2015-04-26 ENCOUNTER — Ambulatory Visit (INDEPENDENT_AMBULATORY_CARE_PROVIDER_SITE_OTHER): Payer: Medicare Other | Admitting: Family

## 2015-04-26 ENCOUNTER — Encounter: Payer: Self-pay | Admitting: Family

## 2015-04-26 VITALS — BP 144/80 | HR 73 | Temp 97.9°F | Resp 16 | Ht 65.25 in | Wt 259.0 lb

## 2015-04-26 DIAGNOSIS — I10 Essential (primary) hypertension: Secondary | ICD-10-CM

## 2015-04-26 DIAGNOSIS — E119 Type 2 diabetes mellitus without complications: Secondary | ICD-10-CM | POA: Diagnosis not present

## 2015-04-26 DIAGNOSIS — E785 Hyperlipidemia, unspecified: Secondary | ICD-10-CM

## 2015-04-26 DIAGNOSIS — R635 Abnormal weight gain: Secondary | ICD-10-CM | POA: Diagnosis not present

## 2015-04-26 LAB — BASIC METABOLIC PANEL
BUN: 16 mg/dL (ref 6–23)
CHLORIDE: 98 meq/L (ref 96–112)
CO2: 29 mEq/L (ref 19–32)
CREATININE: 0.76 mg/dL (ref 0.40–1.20)
Calcium: 9.6 mg/dL (ref 8.4–10.5)
GFR: 83.14 mL/min (ref >60.00)
Glucose, Bld: 96 mg/dL (ref 70–99)
Potassium: 3.9 mEq/L (ref 3.5–5.1)
Sodium: 135 mEq/L (ref 135–145)

## 2015-04-26 LAB — LIPID PANEL
CHOL/HDL RATIO: 4
Cholesterol: 210 mg/dL — ABNORMAL HIGH (ref 0–200)
HDL: 53.4 mg/dL (ref >39.00)
LDL Cholesterol: 121 mg/dL — ABNORMAL HIGH (ref 0–99)
NonHDL: 156.6
TRIGLYCERIDES: 179 mg/dL — AB (ref 0.0–149.0)
VLDL: 35.8 mg/dL (ref 0.0–40.0)

## 2015-04-26 LAB — TSH: TSH: 2.54 u[IU]/mL (ref 0.35–4.50)

## 2015-04-26 LAB — HEMOGLOBIN A1C: HEMOGLOBIN A1C: 7 % — AB (ref 4.6–6.5)

## 2015-04-26 NOTE — Assessment & Plan Note (Signed)
BP is elevated slightly today. Continue current meds. Monitor.

## 2015-04-26 NOTE — Assessment & Plan Note (Signed)
Did not tolerate statin. Will obtain lipid panel.

## 2015-04-26 NOTE — Assessment & Plan Note (Signed)
Fair control per report.  Continue current meds.

## 2015-04-26 NOTE — Assessment & Plan Note (Signed)
Could be side effect of psych med. Did discuss increasing exercise.  Will obtain A1C.

## 2015-04-26 NOTE — Progress Notes (Signed)
Subjective:    Patient ID: [name], female    DOB: 10/01/1956, 59 y.o.   MRN: [id number]  HPI   [name] is a 59 yr old female who presents today for follow up of multiple medical problems.  She does report that she fell backwards on the steps of the pool last Saturday. She has soreness to the right upper back and left buttock. Did have some neck pain which resolved.   Patient presents today for follow up of multiple medical problems.  Diabetes Type 2  Pt is currently maintained on the following medications for diabetes: metformin, januvia.   Lab Results  Component Value Date   HGBA1C 6.5 01/19/2015   HGBA1C 6.2 09/14/2014   HGBA1C 6.9* 06/08/2014    Lab Results  Component Value Date   MICROALBUR 0.57 02/09/2014   LDLCALC 132* 09/14/2014   CREATININE 0.81 01/19/2015   Wt Readings from Last 3 Encounters:  04/26/15 259 lb (117.482 kg)  01/19/15 246 lb (111.585 kg)  09/14/14 241 lb 12.8 oz (109.68 kg)  She attributes the weight gain to use of Brexpiprazole which is being prescribed by psychiatry.   Last diabetic eye exam was:  02/22/15 Denies polyuria/polydipsia. Denies hypoglycemia Home glucose readings range: last night sugar was 164, AM sugars not checking regularly  Hyperlipidemia  Patient is currently maintained on the following medication for hyperlipidemia: Fish oil- reports that she was intolerant to pravastatin due to myalgia.  Last lipid panel as follows:  Lab Results  Component Value Date   CHOL 207* 09/14/2014   HDL 48.30 09/14/2014   LDLCALC 132* 09/14/2014   LDLDIRECT 136.5 10/11/2009   TRIG 133.0 09/14/2014   CHOLHDL 4 09/14/2014  Patient reports good compliance with low fat/low cholesterol diet.   Hypertension  Patient is currently maintained on the following medications for blood pressure: amlodipine, lasix, losartan Patient reports good compliance with blood pressure medications. Patient denies chest pain or swelling. Does get some  shortness of breath with exercise which she attributes to weight gain Last 3 blood pressure readings in our office are as follows: BP Readings from Last 3 Encounters:  04/26/15 144/80  01/19/15 130/80  09/14/14 160/90    Review of Systems    see HPI  Past Medical History  Diagnosis Date  . Diverticulosis   . Restless leg syndrome   . IBS (irritable bowel syndrome)   . IC (interstitial cystitis)   . Hypertension   . Diabetes mellitus     diet controlled  . Depression   . Fibromyalgia   . Anxiety   . Osteopenia   . Arthritis     in knees  . Dyslipidemia   . Chronic fatigue syndrome   . Chronic abdominal pain   . Esophageal reflux   . TIA (transient ischemic attack) 08/22/2012  . Obesity   . Sleep apnea     History   Social History  . Marital Status: Divorced    Spouse Name: N/A  . Number of Children: 2  . Years of Education: N/A   Occupational History  . disabled    Social History Main Topics  . Smoking status: Former Smoker -- 0.10 packs/day for 1 years    Types: Cigarettes    Quit date: 10/19/2012  . Smokeless tobacco: Never Used     Comment: using electronic cigarettes  . Alcohol Use: Yes     Comment: 2 drinks per month  . Drug Use: No  . Sexual Activity: Not on  file   Other Topics Concern  . Not on file   Social History Narrative   Disabled   Divorced, lives alone - 1 dtr, 1 son    Past Surgical History  Procedure Laterality Date  . Total knee arthroplasty      bilateral  . Tonsillectomy and adenoidectomy    . Breast cyst excision  1978    left  . Laparotomy  1982    cyst removed from right ovary and removal of adhesions  . Abdominal exploration surgery  1983    diverticulum removed  . Appendectomy  1983    with exploration  . Laparoscopic incisional / umbilical / ventral hernia repair  1991, 2001, 2007    x 3  . Vaginal hysterectomy  1992  . Laparotomy  1991  . Scalp surgery  1993, 1998    Removal of tumor  . Nasal sinus surgery   2004  . Rectocele repair  2009  . Cervical fusion  2010  . Bladder hydrodistention  12/24/14    Family History  Problem Relation Age of Onset  . Breast cancer Mother   . Dementia Mother   . Breast cancer Maternal Grandmother   . Throat cancer Maternal Grandmother   . Esophageal cancer Maternal Grandmother   . Breast cancer Other     maternal great aunts x 5  . Rheum arthritis Other   . Colon cancer Neg Hx   . Diabetes Father   . Dementia Father   . Diabetes Maternal Grandfather     Allergies  Allergen Reactions  . Atorvastatin     Body aches, worsened interstitial cystitis.  . Chlorzoxazone Other (See Comments)    Unknown reaction  . Gabapentin Nausea And Vomiting  . Gemfibrozil Nausea And Vomiting  . Metronidazole Nausea And Vomiting  . Morphine And Related Nausea And Vomiting  . Nitrofurantoin Nausea And Vomiting  . Olmesartan Medoxomil-Hctz Nausea And Vomiting  . Ondansetron Nausea And Vomiting  . Pentosan Polysulfate Sodium Other (See Comments)    Unknown reaction  . Pravastatin Other (See Comments)    Body aches, worsening interstitial cystitis.  Marland Kitchen Propoxyphene N-Acetaminophen Nausea And Vomiting  . Ramipril Cough  . Sulfonamide Derivatives Nausea And Vomiting    Current Outpatient Prescriptions on File Prior to Visit  Medication Sig Dispense Refill  . ALPRAZolam (XANAX) 1 MG tablet Take 1 tablet (1 mg total) by mouth 3 (three) times daily as needed for anxiety. 30 tablet 0  . amLODipine (NORVASC) 10 MG tablet Take 1 tablet (10 mg total) by mouth at bedtime. 30 tablet 2  . aspirin EC 325 MG tablet Take 325 mg by mouth daily.    . Brexpiprazole 2 MG TABS Take 1 tablet by mouth daily.    Marland Kitchen desvenlafaxine (PRISTIQ) 50 MG 24 hr tablet Take 100 mg by mouth daily.     . Diclofenac Sodium (PENNSAID) 2 % SOLN Place 1 application onto the skin at bedtime as needed (knee pain).    Marland Kitchen dicyclomine (BENTYL) 20 MG tablet Take 20 mg by mouth 3 (three) times daily as needed for  spasms.    . fentaNYL (DURAGESIC) 100 MCG/HR Place 1 patch (100 mcg total) onto the skin every other day. 5 patch 0  . ferrous sulfate 325 (65 FE) MG tablet Take 325 mg by mouth daily with breakfast.    . furosemide (LASIX) 20 MG tablet TAKE ONE TABLET BY MOUTH DAILY 30 tablet 5  . glucose blood (ONE TOUCH ULTRA TEST)  test strip Use as instructed. Dx: 250.00 100 each 6  . lidocaine (LIDODERM) 5 % Place 2 patches onto the skin at bedtime as needed (hip pain).     Marland Kitchen losartan (COZAAR) 100 MG tablet TAKE 1 TABLET (100 MG TOTAL) BY MOUTH DAILY. 30 tablet 3  . metFORMIN (GLUCOPHAGE-XR) 500 MG 24 hr tablet TAKE 4 TABLETS (2,000 MG TOTAL) BY MOUTH DAILY WITH BREAKFAST. 120 tablet 1  . metFORMIN (GLUCOPHAGE-XR) 500 MG 24 hr tablet TAKE 4 TABLETS (2,000 MG TOTAL) BY MOUTH DAILY WITH BREAKFAST. 120 tablet 4  . mirabegron ER (MYRBETRIQ) 50 MG TB24 Take 50 mg by mouth at bedtime.     Marland Kitchen NEXIUM 40 MG capsule TAKE 1 CAPSULE (40 MG TOTAL) BY MOUTH DAILY AT 12 NOON. 30 capsule 2  . nystatin cream (MYCOSTATIN) Apply 1 application topically 2 (two) times daily. 30 g 2  . Omega-3 Fatty Acids (FISH OIL) 1000 MG CAPS Take 1,000 mg by mouth daily.    . potassium gluconate 595 MG TABS tablet Take 595 mg by mouth daily as needed (leg cramps).    . pravastatin (PRAVACHOL) 20 MG tablet Take 1 tablet (20 mg total) by mouth daily. 30 tablet 3  . promethazine (PHENERGAN) 25 MG tablet Take 25 mg by mouth daily as needed for nausea or vomiting.     Marland Kitchen rOPINIRole (REQUIP) 4 MG tablet Take 1 tablet (4 mg total) by mouth at bedtime. 30 tablet 2  . sitaGLIPtin (JANUVIA) 100 MG tablet Take 1 tablet (100 mg total) by mouth at bedtime. 30 tablet 2  . tiZANidine (ZANAFLEX) 4 MG tablet Take 1 tablet (4 mg total) by mouth at bedtime. 30 tablet 0  . topiramate (TOPAMAX) 50 MG tablet Take 50 mg by mouth 2 (two) times daily as needed (headaches).     . traZODone (DESYREL) 100 MG tablet Take 100 mg by mouth at bedtime. Take 1 - 2 tablets at  bedtime as needed.    Marland Kitchen UNABLE TO FIND C-Pap machine , use nightly     No current facility-administered medications on file prior to visit.    BP 144/80 mmHg  Pulse 73  Temp(Src) 97.9 F (36.6 C) (Oral)  Resp 16  Ht 5' 5.25" (1.657 m)  Wt 259 lb (117.482 kg)  BMI 42.79 kg/m2  SpO2 96%    Objective:   Physical Exam        Assessment & Plan:

## 2015-04-26 NOTE — Patient Instructions (Addendum)
Please complete lab work prior to leaving.   Please schedule a follow up appointment in 3 months.  

## 2015-04-26 NOTE — Progress Notes (Signed)
Pre visit review using our clinic review tool, if applicable. No additional management support is needed unless otherwise documented below in the visit note. 

## 2015-04-27 LAB — MICROALBUMIN / CREATININE URINE RATIO
Creatinine,U: 132.6 mg/dL
MICROALB/CREAT RATIO: 1.3 mg/g (ref 0.0–30.0)
Microalb, Ur: 1.7 mg/dL (ref 0.0–1.9)

## 2015-04-29 ENCOUNTER — Telehealth: Payer: Self-pay | Admitting: Family

## 2015-04-29 NOTE — Telephone Encounter (Signed)
ldl is above goal.

## 2015-05-09 ENCOUNTER — Telehealth: Payer: Self-pay | Admitting: Family

## 2015-05-09 MED ORDER — ESOMEPRAZOLE MAGNESIUM 40 MG PO CPDR: DELAYED_RELEASE_CAPSULE | ORAL | Status: DC

## 2015-05-09 NOTE — Telephone Encounter (Signed)
Refill sent to pharmacy.   

## 2015-05-09 NOTE — Telephone Encounter (Signed)
Caller name: [facility]  Relation to pt: pharmacy  Pharmacy:  [id number] - [place], [place] [phone] (Phone) [phone] (Fax)         Reason for call:  Pharmacy requesting a refill NEXIUM 40 MG capsule

## 2015-05-18 ENCOUNTER — Telehealth: Payer: Self-pay | Admitting: Family

## 2015-05-18 NOTE — Telephone Encounter (Signed)
FYI- Spoke with patient who stated her rash began last night (05/17/15) and is on her neck spreading to her back (the same place she had shingles previously).  She describes the rash as painful blistering with some open lesions.  She states that she planned to take her daughter who is pregnant to the pool tomorrow.  Notified patient that this was not a good idea as shingles is contagious and dangerous for a pregnant woman.    Scheduled for appointment with Dr. Etter Sjogren 2:45 05/19/15.

## 2015-05-18 NOTE — Telephone Encounter (Signed)
Caller name: Ornella Relation to pt: Call back number: [phone] Pharmacy:  Reason for call:   Patient states that she received the shingles shot last weekend and now has shingles, the rash. She wants to know if it is ok for her to go swimming in her local pool?

## 2015-05-19 ENCOUNTER — Ambulatory Visit (INDEPENDENT_AMBULATORY_CARE_PROVIDER_SITE_OTHER): Payer: Medicare Other | Admitting: Family Medicine

## 2015-05-19 ENCOUNTER — Encounter: Payer: Self-pay | Admitting: Family Medicine

## 2015-05-19 VITALS — BP 140/82 | HR 87 | Temp 98.1°F | Resp 18 | Ht 65.25 in | Wt 257.0 lb

## 2015-05-19 DIAGNOSIS — B029 Zoster without complications: Secondary | ICD-10-CM | POA: Diagnosis not present

## 2015-05-19 MED ORDER — VALACYCLOVIR HCL 1 G PO TABS: 1000.0000 mg | ORAL_TABLET | Freq: Three times a day (TID) | ORAL | Status: DC

## 2015-05-19 NOTE — Patient Instructions (Signed)

## 2015-05-19 NOTE — Progress Notes (Signed)
Subjective:    Patient ID: [name], female    DOB: December 05, 1955, 59 y.o.   MRN: [id number]  HPI  Patient here for rash on R side shoulder/chest that started 1 week after shingles vaccine.  No other complaints.  She first notice rash on Tuesday.    Past Medical History  Diagnosis Date  . Diverticulosis   . Restless leg syndrome   . IBS (irritable bowel syndrome)   . IC (interstitial cystitis)   . Hypertension   . Diabetes mellitus     diet controlled  . Depression   . Fibromyalgia   . Anxiety   . Osteopenia   . Arthritis     in knees  . Dyslipidemia   . Chronic fatigue syndrome   . Chronic abdominal pain   . Esophageal reflux   . TIA (transient ischemic attack) 08/22/2012  . Obesity   . Sleep apnea     Review of Systems  Constitutional: Positive for fatigue. Negative for activity change, appetite change and unexpected weight change.  Respiratory: Negative for cough and shortness of breath.   Cardiovascular: Negative for chest pain and palpitations.  Skin: Positive for color change and rash.  Psychiatric/Behavioral: Negative for behavioral problems and dysphoric mood. The patient is not nervous/anxious.     Current Outpatient Prescriptions on File Prior to Visit  Medication Sig Dispense Refill  . ALPRAZolam (XANAX) 1 MG tablet Take 1 tablet (1 mg total) by mouth 3 (three) times daily as needed for anxiety. 30 tablet 0  . amLODipine (NORVASC) 10 MG tablet Take 1 tablet (10 mg total) by mouth at bedtime. 30 tablet 2  . aspirin EC 325 MG tablet Take 325 mg by mouth daily.    . Brexpiprazole 2 MG TABS Take 1 tablet by mouth daily.    Marland Kitchen desvenlafaxine (PRISTIQ) 50 MG 24 hr tablet Take 100 mg by mouth daily.     . Diclofenac Sodium (PENNSAID) 2 % SOLN Place 1 application onto the skin at bedtime as needed (knee pain).    Marland Kitchen dicyclomine (BENTYL) 20 MG tablet Take 20 mg by mouth 3 (three) times daily as needed for spasms.    Marland Kitchen esomeprazole (NEXIUM) 40 MG capsule TAKE 1  CAPSULE (40 MG TOTAL) BY MOUTH DAILY AT 12 NOON. 30 capsule 5  . fentaNYL (DURAGESIC) 100 MCG/HR Place 1 patch (100 mcg total) onto the skin every other day. 5 patch 0  . ferrous sulfate 325 (65 FE) MG tablet Take 325 mg by mouth daily with breakfast.    . furosemide (LASIX) 20 MG tablet TAKE ONE TABLET BY MOUTH DAILY 30 tablet 5  . glucose blood (ONE TOUCH ULTRA TEST) test strip Use as instructed. Dx: 250.00 100 each 6  . lidocaine (LIDODERM) 5 % Place 2 patches onto the skin at bedtime as needed (hip pain).     Marland Kitchen losartan (COZAAR) 100 MG tablet TAKE 1 TABLET (100 MG TOTAL) BY MOUTH DAILY. 30 tablet 3  . metFORMIN (GLUCOPHAGE-XR) 500 MG 24 hr tablet TAKE 4 TABLETS (2,000 MG TOTAL) BY MOUTH DAILY WITH BREAKFAST. 120 tablet 4  . mirabegron ER (MYRBETRIQ) 50 MG TB24 Take 50 mg by mouth at bedtime.     Marland Kitchen nystatin cream (MYCOSTATIN) Apply 1 application topically 2 (two) times daily. 30 g 2  . Omega-3 Fatty Acids (FISH OIL) 1000 MG CAPS Take 1,000 mg by mouth daily.    . Oxycodone HCl 10 MG TABS Take 10 mg by mouth every 8 (  eight) hours as needed.  0  . potassium gluconate 595 MG TABS tablet Take 595 mg by mouth daily as needed (leg cramps).    . promethazine (PHENERGAN) 25 MG tablet Take 25 mg by mouth daily as needed for nausea or vomiting.     Marland Kitchen rOPINIRole (REQUIP) 4 MG tablet Take 1 tablet (4 mg total) by mouth at bedtime. 30 tablet 2  . sitaGLIPtin (JANUVIA) 100 MG tablet Take 1 tablet (100 mg total) by mouth at bedtime. 30 tablet 2  . tiZANidine (ZANAFLEX) 4 MG tablet Take 1 tablet (4 mg total) by mouth at bedtime. 30 tablet 0  . topiramate (TOPAMAX) 50 MG tablet Take 50 mg by mouth 2 (two) times daily as needed (headaches).     . traZODone (DESYREL) 100 MG tablet Take 100 mg by mouth at bedtime. Take 1 - 2 tablets at bedtime as needed.    Marland Kitchen UNABLE TO FIND C-Pap machine , use nightly    . [DISCONTINUED] pravastatin (PRAVACHOL) 20 MG tablet Take 1 tablet (20 mg total) by mouth daily. 30 tablet 3     No current facility-administered medications on file prior to visit.       Objective:    Physical Exam  Constitutional: She is oriented to person, place, and time. She appears well-developed and well-nourished.  HENT:  Head: Normocephalic and atraumatic.  Eyes: Conjunctivae and EOM are normal.  Neck: Normal range of motion. Neck supple. No JVD present. Carotid bruit is not present. No thyromegaly present.  Pulmonary/Chest: Effort normal and breath sounds normal. She exhibits no tenderness.  Musculoskeletal: She exhibits no edema.  Neurological: She is alert and oriented to person, place, and time.  Skin: Rash noted.  Psychiatric: She has a normal mood and affect.    BP 140/82 mmHg  Pulse 87  Temp(Src) 98.1 F (36.7 C) (Oral)  Resp 18  Ht 5' 5.25" (1.657 m)  Wt 257 lb (116.574 kg)  BMI 42.46 kg/m2  SpO2 98% Wt Readings from Last 3 Encounters:  05/19/15 257 lb (116.574 kg)  04/26/15 259 lb (117.482 kg)  01/19/15 246 lb (111.585 kg)     Lab Results  Component Value Date   WBC 5.4 08/30/2014   HGB 13.0 08/30/2014   HCT 39.9 08/30/2014   PLT 179.0 08/30/2014   GLUCOSE 96 04/26/2015   CHOL 210* 04/26/2015   TRIG 179.0* 04/26/2015   HDL 53.40 04/26/2015   LDLDIRECT 136.5 10/11/2009   LDLCALC 121* 04/26/2015   ALT 19 08/08/2014   AST 19 08/08/2014   NA 135 04/26/2015   K 3.9 04/26/2015   CL 98 04/26/2015   CREATININE 0.76 04/26/2015   BUN 16 04/26/2015   CO2 29 04/26/2015   TSH 2.54 04/26/2015   INR 1.13 08/01/2014   HGBA1C 7.0* 04/26/2015   MICROALBUR 1.7 04/26/2015       Assessment & Plan:   Problem List Items Addressed This Visit    None    Visit Diagnoses    Shingles    -  Primary    Relevant Medications    valACYclovir (VALTREX) 1000 MG tablet       I am having [name] start on valACYclovir. I am also having her maintain her desvenlafaxine, mirabegron ER, promethazine, aspirin EC, lidocaine, glucose blood, traZODone, UNABLE TO FIND,  topiramate, dicyclomine, Fish Oil, Diclofenac Sodium, potassium gluconate, ALPRAZolam, fentaNYL, tiZANidine, ferrous sulfate, furosemide, Brexpiprazole, nystatin cream, metFORMIN, losartan, amLODipine, rOPINIRole, sitaGLIPtin, Oxycodone HCl, and esomeprazole.  Meds ordered this encounter  Medications  . valACYclovir (VALTREX) 1000 MG tablet    Sig: Take 1 tablet (1,000 mg total) by mouth 3 (three) times daily.    Dispense:  30 tablet    Refill:  0     Garnet Koyanagi, DO

## 2015-05-19 NOTE — Progress Notes (Signed)
Pre visit review using our clinic review tool, if applicable. No additional management support is needed unless otherwise documented below in the visit note. 

## 2015-05-21 ENCOUNTER — Other Ambulatory Visit: Payer: Self-pay | Admitting: Family

## 2015-05-26 ENCOUNTER — Other Ambulatory Visit: Payer: Self-pay | Admitting: Family

## 2015-05-27 NOTE — Telephone Encounter (Signed)
Last OV:  04/26/15 Last filled:  02/03/15 Amt: 30, 3 [facility] at [facility] pt.  Pt states she no longer uses [facility] in [facility] and would like for med refill to be sent [facility] in [place], [place]. Previous Rx d/c'd.  [facility].

## 2015-05-29 ENCOUNTER — Telehealth: Payer: Self-pay | Admitting: Family

## 2015-05-29 DIAGNOSIS — M8580 Other specified disorders of bone density and structure, unspecified site: Secondary | ICD-10-CM

## 2015-05-29 NOTE — Telephone Encounter (Signed)
It looks like bone density test did not get scheduled which was ordered in May, could you contact pt and schedule?

## 2015-05-30 NOTE — Telephone Encounter (Signed)
Can you please place new order, original order was placed in 2015. I am unable to schedule for that order. Thanks

## 2015-05-30 NOTE — Telephone Encounter (Signed)
Order placed

## 2015-06-03 ENCOUNTER — Other Ambulatory Visit: Payer: Self-pay | Admitting: Family

## 2015-06-13 ENCOUNTER — Ambulatory Visit (HOSPITAL_BASED_OUTPATIENT_CLINIC_OR_DEPARTMENT_OTHER)
Admission: RE | Admit: 2015-06-13 | Discharge: 2015-06-13 | Disposition: A | Discharge status: Home / Self Care | Payer: Medicare Other | Source: Ambulatory Visit | Attending: Family | Admitting: Family

## 2015-06-13 DIAGNOSIS — Z13820 Encounter for screening for osteoporosis: Secondary | ICD-10-CM | POA: Diagnosis not present

## 2015-06-13 DIAGNOSIS — Z1231 Encounter for screening mammogram for malignant neoplasm of breast: Secondary | ICD-10-CM | POA: Insufficient documentation

## 2015-06-13 DIAGNOSIS — M8580 Other specified disorders of bone density and structure, unspecified site: Secondary | ICD-10-CM | POA: Insufficient documentation

## 2015-06-18 ENCOUNTER — Other Ambulatory Visit: Payer: Self-pay | Admitting: Family

## 2015-07-19 ENCOUNTER — Other Ambulatory Visit: Payer: Self-pay | Admitting: Family

## 2015-07-27 ENCOUNTER — Other Ambulatory Visit: Payer: Self-pay | Admitting: Family

## 2015-07-29 ENCOUNTER — Encounter (HOSPITAL_BASED_OUTPATIENT_CLINIC_OR_DEPARTMENT_OTHER): Payer: Self-pay | Admitting: *Deleted

## 2015-07-29 ENCOUNTER — Emergency Department (HOSPITAL_BASED_OUTPATIENT_CLINIC_OR_DEPARTMENT_OTHER)
Admission: EM | Admit: 2015-07-29 | Discharge: 2015-07-29 | Disposition: A | Discharge status: Home / Self Care | Payer: Medicare Other | Attending: Emergency Medicine | Admitting: Emergency Medicine

## 2015-07-29 DIAGNOSIS — F419 Anxiety disorder, unspecified: Secondary | ICD-10-CM | POA: Diagnosis not present

## 2015-07-29 DIAGNOSIS — Z8673 Personal history of transient ischemic attack (TIA), and cerebral infarction without residual deficits: Secondary | ICD-10-CM | POA: Insufficient documentation

## 2015-07-29 DIAGNOSIS — E119 Type 2 diabetes mellitus without complications: Secondary | ICD-10-CM | POA: Diagnosis not present

## 2015-07-29 DIAGNOSIS — Z79899 Other long term (current) drug therapy: Secondary | ICD-10-CM | POA: Insufficient documentation

## 2015-07-29 DIAGNOSIS — F329 Major depressive disorder, single episode, unspecified: Secondary | ICD-10-CM | POA: Diagnosis not present

## 2015-07-29 DIAGNOSIS — M1990 Unspecified osteoarthritis, unspecified site: Secondary | ICD-10-CM | POA: Insufficient documentation

## 2015-07-29 DIAGNOSIS — M797 Fibromyalgia: Secondary | ICD-10-CM | POA: Insufficient documentation

## 2015-07-29 DIAGNOSIS — G8929 Other chronic pain: Secondary | ICD-10-CM | POA: Diagnosis not present

## 2015-07-29 DIAGNOSIS — Z7982 Long term (current) use of aspirin: Secondary | ICD-10-CM | POA: Insufficient documentation

## 2015-07-29 DIAGNOSIS — Z87448 Personal history of other diseases of urinary system: Secondary | ICD-10-CM | POA: Insufficient documentation

## 2015-07-29 DIAGNOSIS — K589 Irritable bowel syndrome without diarrhea: Secondary | ICD-10-CM | POA: Insufficient documentation

## 2015-07-29 DIAGNOSIS — E669 Obesity, unspecified: Secondary | ICD-10-CM | POA: Insufficient documentation

## 2015-07-29 DIAGNOSIS — E785 Hyperlipidemia, unspecified: Secondary | ICD-10-CM | POA: Diagnosis not present

## 2015-07-29 DIAGNOSIS — R339 Retention of urine, unspecified: Secondary | ICD-10-CM | POA: Insufficient documentation

## 2015-07-29 DIAGNOSIS — I10 Essential (primary) hypertension: Secondary | ICD-10-CM | POA: Diagnosis not present

## 2015-07-29 NOTE — Discharge Instructions (Signed)
Return here as needed.  Follow-up with your urologist. °

## 2015-07-29 NOTE — ED Provider Notes (Signed)
CSN: [id number]     Arrival date & time 07/29/15  1804 History   First MD Initiated Contact with Patient 07/29/15 1830     Chief Complaint  Patient presents with  . Urinary Retention     (Consider location/radiation/quality/duration/timing/severity/associated sxs/prior Treatment) HPI Patient presents to the emergency department with urinary retention.  The patient had a procedure done today or she had Botox injection into the bladder for interstitial cystitis.  Patient states that she has never had this side effect, although this is a known side effect of the procedure.  The patient states she did not know some blood coming from her urethra when she tried to urinate earlier.  Patient states that she has had no nausea, vomiting, abdominal pain, weakness, dizziness, fever or syncope.  Patient states she does have a lot of pressure over her bladder Past Medical History  Diagnosis Date  . Diverticulosis   . Restless leg syndrome   . IBS (irritable bowel syndrome)   . IC (interstitial cystitis)   . Hypertension   . Diabetes mellitus     diet controlled  . Depression   . Fibromyalgia   . Anxiety   . Osteopenia   . Arthritis     in knees  . Dyslipidemia   . Chronic fatigue syndrome   . Chronic abdominal pain   . Esophageal reflux   . TIA (transient ischemic attack) 08/22/2012  . Obesity   . Sleep apnea    Past Surgical History  Procedure Laterality Date  . Total knee arthroplasty      bilateral  . Tonsillectomy and adenoidectomy    . Breast cyst excision  1978    left  . Laparotomy  1982    cyst removed from right ovary and removal of adhesions  . Abdominal exploration surgery  1983    diverticulum removed  . Appendectomy  1983    with exploration  . Laparoscopic incisional / umbilical / ventral hernia repair  1991, 2001, 2007    x 3  . Vaginal hysterectomy  1992  . Laparotomy  1991  . Scalp surgery  1993, 1998    Removal of tumor  . Nasal sinus surgery  2004  .  Rectocele repair  2009  . Cervical fusion  2010  . Bladder hydrodistention  12/24/14   Family History  Problem Relation Age of Onset  . Breast cancer Mother   . Dementia Mother   . Breast cancer Maternal Grandmother   . Throat cancer Maternal Grandmother   . Esophageal cancer Maternal Grandmother   . Breast cancer Other     maternal great aunts x 5  . Rheum arthritis Other   . Colon cancer Neg Hx   . Diabetes Father   . Dementia Father   . Diabetes Maternal Grandfather    Social History  Substance Use Topics  . Smoking status: Former Smoker -- 0.10 packs/day for 1 years    Types: Cigarettes    Quit date: 10/19/2012  . Smokeless tobacco: Never Used     Comment: using electronic cigarettes  . Alcohol Use: Yes     Comment: 2 drinks per month   OB History    No data available     Review of Systems All other systems negative except as documented in the HPI. All pertinent positives and negatives as reviewed in the HPI.  Allergies  Atorvastatin; Chlorzoxazone; Gabapentin; Gemfibrozil; Metronidazole; Morphine and related; Nitrofurantoin; Olmesartan medoxomil-hctz; Ondansetron; Pentosan polysulfate sodium; Pravastatin; Propoxyphene n-acetaminophen; Ramipril;  and Sulfonamide derivatives  Home Medications   Prior to Admission medications   Medication Sig Start Date End Date Taking? Authorizing Provider  ALPRAZolam Duanne Moron) 1 MG tablet Take 1 tablet (1 mg total) by mouth 3 (three) times daily as needed for anxiety. 08/09/14   Velvet Bathe, MD  amLODipine (NORVASC) 10 MG tablet TAKE 1 TABLET BY MOUTH EVERY NIGHT AT BEDTIME 05/24/15   [name], NP  aspirin EC 325 MG tablet Take 325 mg by mouth daily.    Historical Provider, MD  Brexpiprazole 2 MG TABS Take 1 tablet by mouth daily.    Historical Provider, MD  desvenlafaxine (PRISTIQ) 50 MG 24 hr tablet Take 100 mg by mouth daily.     Historical Provider, MD  Diclofenac Sodium (PENNSAID) 2 % SOLN Place 1 application onto the skin  at bedtime as needed (knee pain).    Historical Provider, MD  dicyclomine (BENTYL) 20 MG tablet Take 20 mg by mouth 3 (three) times daily as needed for spasms.    Historical Provider, MD  esomeprazole (NEXIUM) 40 MG capsule TAKE 1 CAPSULE (40 MG TOTAL) BY MOUTH DAILY AT 12 NOON. 05/09/15   [name], NP  fentaNYL (DURAGESIC) 100 MCG/HR Place 1 patch (100 mcg total) onto the skin every other day. 08/09/14   Velvet Bathe, MD  ferrous sulfate 325 (65 FE) MG tablet Take 325 mg by mouth daily with breakfast.    Historical Provider, MD  furosemide (LASIX) 20 MG tablet TAKE 1 TABLET BY MOUTH DAILY 07/29/15   [name], NP  glucose blood (ONE TOUCH ULTRA TEST) test strip Use as instructed. Dx: 250.00 07/17/13   [name], NP  JANUVIA 100 MG tablet TAKE 1 TABLET BY MOUTH EVERY NIGHT AT BEDTIME 06/06/15   [name], NP  lidocaine (LIDODERM) 5 % Place 2 patches onto the skin at bedtime as needed (hip pain).  03/26/13   Historical Provider, MD  losartan (COZAAR) 100 MG tablet TAKE 1 TABLET BY MOUTH DAILY 05/27/15   [name], NP  metFORMIN (GLUCOPHAGE-XR) 500 MG 24 hr tablet TAKE 4 TABLETS BY MOUTH DAILY WITH BREAKFAST 07/20/15   [name], NP  mirabegron ER (MYRBETRIQ) 50 MG TB24 Take 50 mg by mouth at bedtime.     Historical Provider, MD  nystatin cream (MYCOSTATIN) Apply 1 application topically 2 (two) times daily. 01/19/15   [name], NP  Omega-3 Fatty Acids (FISH OIL) 1000 MG CAPS Take 1,000 mg by mouth daily.    Historical Provider, MD  Oxycodone HCl 10 MG TABS Take 10 mg by mouth every 8 (eight) hours as needed. 04/16/15   Historical Provider, MD  potassium gluconate 595 MG TABS tablet Take 595 mg by mouth daily as needed (leg cramps).    Historical Provider, MD  promethazine (PHENERGAN) 25 MG tablet Take 25 mg by mouth daily as needed for nausea or vomiting.  08/03/11   Rowe Clack, MD  rOPINIRole (REQUIP) 4 MG tablet TAKE 1 TABLET(4 MG) BY MOUTH  AT BEDTIME 06/06/15   [name], NP  tiZANidine (ZANAFLEX) 4 MG tablet Take 1 tablet (4 mg total) by mouth at bedtime. 08/09/14   Velvet Bathe, MD  topiramate (TOPAMAX) 50 MG tablet Take 50 mg by mouth 2 (two) times daily as needed (headaches).  03/31/14   [name], NP  traZODone (DESYREL) 100 MG tablet Take 100 mg by mouth at bedtime. Take 1 - 2 tablets at bedtime as needed.    Historical Provider, MD  Karen Kays  TO FIND C-Pap machine , use nightly    Historical Provider, MD  valACYclovir (VALTREX) 1000 MG tablet Take 1 tablet (1,000 mg total) by mouth 3 (three) times daily. 05/19/15   Alferd Apa Lowne, DO   BP 156/78 mmHg  Pulse 93  Temp(Src) 98.4 F (36.9 C) (Oral)  Resp 20  Ht 5' 5.5" (1.664 m)  Wt 257 lb (116.574 kg)  BMI 42.10 kg/m2  SpO2 97% Physical Exam  Constitutional: She is oriented to person, place, and time. She appears well-developed and well-nourished.  HENT:  Head: Normocephalic and atraumatic.  Cardiovascular: Normal rate, regular rhythm and normal heart sounds.  Exam reveals no gallop and no friction rub.   No murmur heard. Pulmonary/Chest: Effort normal and breath sounds normal. No respiratory distress.  Abdominal: Soft. Normal appearance and bowel sounds are normal.    Neurological: She is alert and oriented to person, place, and time. She exhibits normal muscle tone. Coordination normal.  Skin: Skin is warm and dry. No rash noted. No erythema.  Nursing note and vitals reviewed.   ED Course  Procedures (including critical care time)  I have personally reviewed and evaluated these images and lab results as part of my medical decision-making.  7:40 PM.  The patient reported to the nurse that she was able to urinate without difficulty, but did not catch the urine.  She states she urinated a fair amount and she states she would like to go home at this time.  Patient is advised follow-up with her urologist.  Told to return here as needed  Dalia Heading, PA-C 07/29/15 [place], MD 07/29/15 [phone]

## 2015-07-29 NOTE — ED Notes (Signed)
Unable to urinate x 4 hours. She had a botox procedure for IC earlier today.

## 2015-08-09 ENCOUNTER — Ambulatory Visit: Payer: Medicare Other | Admitting: Family

## 2015-08-19 ENCOUNTER — Other Ambulatory Visit: Payer: Self-pay | Admitting: Family

## 2015-08-22 ENCOUNTER — Telehealth: Payer: Self-pay | Admitting: Family

## 2015-08-22 NOTE — Telephone Encounter (Signed)
Pre visit letter mailed 08/22/15  °

## 2015-08-24 ENCOUNTER — Other Ambulatory Visit: Payer: Self-pay | Admitting: Family

## 2015-09-12 ENCOUNTER — Telehealth: Payer: Self-pay | Admitting: Family

## 2015-09-12 ENCOUNTER — Encounter: Payer: Medicare Other | Admitting: Family

## 2015-09-12 NOTE — Telephone Encounter (Signed)
Patient no show appointment today due to her having a migrain patient [facility] appointment - charge or no charge

## 2015-09-12 NOTE — Telephone Encounter (Signed)
No charge. 

## 2015-09-20 ENCOUNTER — Encounter: Payer: Medicare Other | Admitting: Family

## 2015-09-20 ENCOUNTER — Telehealth: Payer: Self-pay | Admitting: Family

## 2015-09-20 DIAGNOSIS — Z0289 Encounter for other administrative examinations: Secondary | ICD-10-CM

## 2015-09-23 NOTE — Telephone Encounter (Signed)
Pt was no show 09/20/15 2:45pm for cpe appt, 2nd no show for cpe, pt is rescheduled for 11/28/15, no reason noted in chart for no show, 3rd no show since 12/2014, several cancellations. Charge or no charge?

## 2015-09-23 NOTE — Telephone Encounter (Signed)
Charge please 

## 2015-10-20 ENCOUNTER — Other Ambulatory Visit: Payer: Self-pay | Admitting: Family

## 2015-11-01 ENCOUNTER — Other Ambulatory Visit: Payer: Self-pay | Admitting: Family

## 2015-11-13 ENCOUNTER — Other Ambulatory Visit: Payer: Self-pay | Admitting: Physician Assistant

## 2015-11-13 NOTE — Telephone Encounter (Signed)
Will defer further refills of patient's medications to PCP  

## 2015-11-28 ENCOUNTER — Encounter: Payer: Medicare Other | Admitting: Family

## 2015-12-16 ENCOUNTER — Encounter: Payer: Self-pay | Admitting: Family

## 2015-12-16 ENCOUNTER — Ambulatory Visit (INDEPENDENT_AMBULATORY_CARE_PROVIDER_SITE_OTHER): Payer: Medicare Other | Admitting: Family

## 2015-12-16 ENCOUNTER — Ambulatory Visit (HOSPITAL_BASED_OUTPATIENT_CLINIC_OR_DEPARTMENT_OTHER)
Admission: RE | Admit: 2015-12-16 | Discharge: 2015-12-16 | Disposition: A | Discharge status: Home / Self Care | Payer: Medicare Other | Source: Ambulatory Visit | Attending: Family | Admitting: Family

## 2015-12-16 VITALS — BP 150/74 | HR 96 | Temp 99.2°F | Resp 18 | Ht 65.5 in | Wt 263.0 lb

## 2015-12-16 DIAGNOSIS — R05 Cough: Secondary | ICD-10-CM | POA: Insufficient documentation

## 2015-12-16 DIAGNOSIS — R509 Fever, unspecified: Secondary | ICD-10-CM | POA: Diagnosis not present

## 2015-12-16 LAB — POCT RAPID STREP A (OFFICE): Rapid Strep A Screen: NEGATIVE

## 2015-12-16 LAB — POCT INFLUENZA A/B
Influenza A, POC: NEGATIVE
Influenza B, POC: NEGATIVE

## 2015-12-16 MED ORDER — ALBUTEROL SULFATE HFA 108 (90 BASE) MCG/ACT IN AERS: 2.0000 | INHALATION_SPRAY | Freq: Four times a day (QID) | RESPIRATORY_TRACT | Status: DC | PRN

## 2015-12-16 MED ORDER — BENZONATATE 100 MG PO CAPS: 100.0000 mg | ORAL_CAPSULE | Freq: Three times a day (TID) | ORAL | Status: DC | PRN

## 2015-12-16 NOTE — Progress Notes (Signed)
Subjective:    Patient ID: [name], female    DOB: [birth year], 60 y.o.   MRN: [id number]  HPI  [name] is a 60 yr old female who presents today with chief complaint of cough/sore throat and fever.  Symptoms have been present x 1 week and is associated with myalgia.  Fever 100.2-100.5. Pt reports that her mother had similar symptoms. She has been taking dayquil and nyquil without significant improvement.    Review of Systems See HPI  Past Medical History  Diagnosis Date  . Diverticulosis   . Restless leg syndrome   . IBS (irritable bowel syndrome)   . IC (interstitial cystitis)   . Hypertension   . Diabetes mellitus     diet controlled  . Depression   . Fibromyalgia   . Anxiety   . Osteopenia   . Arthritis     in knees  . Dyslipidemia   . Chronic fatigue syndrome   . Chronic abdominal pain   . Esophageal reflux   . TIA (transient ischemic attack) 08/22/2012  . Obesity   . Sleep apnea     Social History   Social History  . Marital Status: Divorced    Spouse Name: N/A  . Number of Children: 2  . Years of Education: N/A   Occupational History  . disabled    Social History Main Topics  . Smoking status: Former Smoker -- 0.10 packs/day for 1 years    Types: Cigarettes    Quit date: 10/19/2012  . Smokeless tobacco: Never Used     Comment: using electronic cigarettes  . Alcohol Use: Yes     Comment: 2 drinks per month  . Drug Use: No  . Sexual Activity: Not on file   Other Topics Concern  . Not on file   Social History Narrative   Disabled   Divorced, lives alone - 1 dtr, 1 son    Past Surgical History  Procedure Laterality Date  . Total knee arthroplasty      bilateral  . Tonsillectomy and adenoidectomy    . Breast cyst excision  1978    left  . Laparotomy  1982    cyst removed from right ovary and removal of adhesions  . Abdominal exploration surgery  1983    diverticulum removed  . Appendectomy  1983    with exploration  .  Laparoscopic incisional / umbilical / ventral hernia repair  1991, 2001, 2007    x 3  . Vaginal hysterectomy  1992  . Laparotomy  1991  . Scalp surgery  1993, 1998    Removal of tumor  . Nasal sinus surgery  2004  . Rectocele repair  2009  . Cervical fusion  2010  . Bladder hydrodistention  12/24/14    Family History  Problem Relation Age of Onset  . Breast cancer Mother   . Dementia Mother   . Breast cancer Maternal Grandmother   . Throat cancer Maternal Grandmother   . Esophageal cancer Maternal Grandmother   . Breast cancer Other     maternal great aunts x 5  . Rheum arthritis Other   . Colon cancer Neg Hx   . Diabetes Father   . Dementia Father   . Diabetes Maternal Grandfather     Allergies  Allergen Reactions  . Atorvastatin     Body aches, worsened interstitial cystitis.  . Buprenorphine Hcl Nausea And Vomiting  . Chlorzoxazone Other (See Comments) and Nausea Only  Unknown reaction  . Gabapentin Nausea And Vomiting  . Gemfibrozil Nausea And Vomiting  . Metronidazole Nausea And Vomiting  . Morphine And Related Nausea And Vomiting  . Nitrofurantoin Nausea And Vomiting  . Olmesartan Medoxomil-Hctz Nausea And Vomiting  . Ondansetron Nausea And Vomiting  . Pentosan Polysulfate Sodium Other (See Comments)    Unknown reaction  . Pravastatin Other (See Comments)    Body aches, worsening interstitial cystitis.  Marland Kitchen Propoxyphene N-Acetaminophen Nausea And Vomiting  . Ramipril Cough  . Sulfonamide Derivatives Nausea And Vomiting    Current Outpatient Prescriptions on File Prior to Visit  Medication Sig Dispense Refill  . ALPRAZolam (XANAX) 1 MG tablet Take 1 tablet (1 mg total) by mouth 3 (three) times daily as needed for anxiety. 30 tablet 0  . amLODipine (NORVASC) 10 MG tablet TAKE 1 TABLET BY MOUTH EVERY NIGHT AT BEDTIME 30 tablet 5  . aspirin EC 325 MG tablet Take 325 mg by mouth daily.    . Brexpiprazole 2 MG TABS Take 1 tablet by mouth daily.    Marland Kitchen  desvenlafaxine (PRISTIQ) 50 MG 24 hr tablet Take 100 mg by mouth daily.     . Diclofenac Sodium (PENNSAID) 2 % SOLN Place 1 application onto the skin at bedtime as needed (knee pain).    Marland Kitchen dicyclomine (BENTYL) 20 MG tablet Take 20 mg by mouth 3 (three) times daily as needed for spasms.    Marland Kitchen esomeprazole (NEXIUM) 40 MG capsule Take 1 capsule (40 mg total) by mouth daily at 12 noon. 15 capsule 0  . fentaNYL (DURAGESIC) 100 MCG/HR Place 1 patch (100 mcg total) onto the skin every other day. 5 patch 0  . ferrous sulfate 325 (65 FE) MG tablet Take 325 mg by mouth daily with breakfast.    . furosemide (LASIX) 20 MG tablet TAKE 1 TABLET BY MOUTH DAILY 30 tablet 5  . glucose blood (ONE TOUCH ULTRA TEST) test strip Use as instructed. Dx: 250.00 100 each 6  . JANUVIA 100 MG tablet TAKE 1 TABLET BY MOUTH EVERY NIGHT AT BEDTIME 30 tablet 5  . lidocaine (LIDODERM) 5 % Place 2 patches onto the skin at bedtime as needed (hip pain).     Marland Kitchen losartan (COZAAR) 100 MG tablet TAKE 1 TABLET BY MOUTH DAILY 30 tablet 5  . metFORMIN (GLUCOPHAGE-XR) 500 MG 24 hr tablet TAKE 4 TABLETS BY MOUTH DAILY WITH BREAKFAST 120 tablet 5  . mirabegron ER (MYRBETRIQ) 50 MG TB24 Take 50 mg by mouth at bedtime.     Marland Kitchen nystatin cream (MYCOSTATIN) Apply 1 application topically 2 (two) times daily. 30 g 2  . Omega-3 Fatty Acids (FISH OIL) 1000 MG CAPS Take 1,000 mg by mouth daily.    . Oxycodone HCl 10 MG TABS Take 10 mg by mouth every 8 (eight) hours as needed.  0  . potassium gluconate 595 MG TABS tablet Take 595 mg by mouth daily as needed (leg cramps).    . promethazine (PHENERGAN) 25 MG tablet Take 25 mg by mouth daily as needed for nausea or vomiting.     Marland Kitchen rOPINIRole (REQUIP) 4 MG tablet TAKE 1 TABLET(4 MG) BY MOUTH AT BEDTIME 30 tablet 5  . tiZANidine (ZANAFLEX) 4 MG tablet Take 1 tablet (4 mg total) by mouth at bedtime. 30 tablet 0  . topiramate (TOPAMAX) 50 MG tablet Take 50 mg by mouth 2 (two) times daily as needed (headaches).      . traZODone (DESYREL) 100 MG tablet Take 100  mg by mouth at bedtime. Take 1 - 2 tablets at bedtime as needed.    Marland Kitchen UNABLE TO FIND C-Pap machine , use nightly    . valACYclovir (VALTREX) 1000 MG tablet Take 1 tablet (1,000 mg total) by mouth 3 (three) times daily. 30 tablet 0  . [DISCONTINUED] pravastatin (PRAVACHOL) 20 MG tablet Take 1 tablet (20 mg total) by mouth daily. 30 tablet 3   No current facility-administered medications on file prior to visit.    BP 150/74 mmHg  Pulse 96  Temp(Src) 99.2 F (37.3 C) (Oral)  Resp 18  Ht 5' 5.5" (1.664 m)  Wt 263 lb (119.296 kg)  BMI 43.08 kg/m2  SpO2 98%       Objective:   Physical Exam  Constitutional: She is oriented to person, place, and time. She appears well-developed and well-nourished.  HENT:  Head: Normocephalic and atraumatic.  Right Ear: Tympanic membrane and ear canal normal.  Left Ear: Tympanic membrane and ear canal normal.  Mouth/Throat: No oropharyngeal exudate, posterior oropharyngeal edema or posterior oropharyngeal erythema.  Cardiovascular: Normal rate, regular rhythm and normal heart sounds.   No murmur heard. Pulmonary/Chest: Effort normal and breath sounds normal. No respiratory distress.  Soft expiratory wheeze  Musculoskeletal: She exhibits no edema.  Lymphadenopathy:    She has no cervical adenopathy.  Neurological: She is alert and oriented to person, place, and time.  Skin: Skin is warm and dry.  Psychiatric: She has a normal mood and affect. Her behavior is normal. Judgment and thought content normal.          Assessment & Plan:  Viral URI with cough- CXR clear, strep and flu swabs negative.  Advised pt as follows:  Start tessalon as needed for cough. You may use albuterol inhaler every 6 hours as needed. Call if symptoms worsen, if you develop fever >101 or if symptoms are not improved in 3 days.

## 2015-12-16 NOTE — Patient Instructions (Signed)
Complete chest x ray on the first floor.  Start tessalon as needed for cough. You may use albuterol inhaler every 6 hours as needed. Call if symptoms worsen, if you develop fever >101 or if symptoms are not improved in 3 days.

## 2015-12-23 ENCOUNTER — Other Ambulatory Visit: Payer: Self-pay | Admitting: Family

## 2015-12-26 ENCOUNTER — Telehealth: Payer: Self-pay | Admitting: Family

## 2015-12-26 NOTE — Telephone Encounter (Signed)
Caller name: Joycelyn Schmid  Relation to pt: diabetes Case Manager from [facility] Call back number: [phone]  Ext [phone] fax [phone]  Pharmacy:  Reason for call:  Inquiring about the most BP and lab results

## 2015-12-29 NOTE — Telephone Encounter (Signed)
Called Margaret and left a voice message for call back.

## 2015-12-29 NOTE — Telephone Encounter (Signed)
DM Case Manager called back.  Recent BP and A1c given. No further questions or concerns voiced.

## 2016-02-02 ENCOUNTER — Other Ambulatory Visit: Payer: Self-pay | Admitting: Family

## 2016-02-21 ENCOUNTER — Telehealth: Payer: Self-pay | Admitting: Family

## 2016-02-21 NOTE — Telephone Encounter (Signed)
Patient called stating that she received No Show fees for 10/24 and 11/1 can these charges be reversed? Explained to patient that she called the day of the appointment to reschedule, thus she received a fee.

## 2016-02-21 NOTE — Telephone Encounter (Signed)
I requested no charge for the 10/24 no show.  Could you please reverse that charge?

## 2016-02-22 ENCOUNTER — Other Ambulatory Visit: Payer: Self-pay | Admitting: Family

## 2016-02-22 NOTE — Telephone Encounter (Signed)
Ok I will call the patient 

## 2016-02-22 NOTE — Telephone Encounter (Signed)
Yes we do waive the charge for 10/24, the patient no showed again on 09/20/15. So that is where these charges are accruing from. Would you like to waive the fee for the 09/20/15 no show as well?

## 2016-02-22 NOTE — Telephone Encounter (Signed)
No, please leave 11/1.

## 2016-03-05 ENCOUNTER — Other Ambulatory Visit: Payer: Self-pay | Admitting: Family

## 2016-03-13 ENCOUNTER — Encounter: Payer: Self-pay | Admitting: Family

## 2016-03-13 ENCOUNTER — Ambulatory Visit (INDEPENDENT_AMBULATORY_CARE_PROVIDER_SITE_OTHER): Payer: Medicare Other | Admitting: Family

## 2016-03-13 VITALS — BP 137/76 | HR 79 | Temp 98.7°F | Resp 16 | Ht 65.0 in | Wt 255.4 lb

## 2016-03-13 DIAGNOSIS — G2581 Restless legs syndrome: Secondary | ICD-10-CM

## 2016-03-13 DIAGNOSIS — F329 Major depressive disorder, single episode, unspecified: Secondary | ICD-10-CM

## 2016-03-13 DIAGNOSIS — I10 Essential (primary) hypertension: Secondary | ICD-10-CM

## 2016-03-13 DIAGNOSIS — E119 Type 2 diabetes mellitus without complications: Secondary | ICD-10-CM | POA: Diagnosis not present

## 2016-03-13 DIAGNOSIS — Z0000 Encounter for general adult medical examination without abnormal findings: Secondary | ICD-10-CM

## 2016-03-13 DIAGNOSIS — F32A Depression, unspecified: Secondary | ICD-10-CM

## 2016-03-13 DIAGNOSIS — G894 Chronic pain syndrome: Secondary | ICD-10-CM

## 2016-03-13 LAB — MICROALBUMIN / CREATININE URINE RATIO
CREATININE, U: 86.4 mg/dL
MICROALB UR: 0.7 mg/dL (ref 0.0–1.9)
MICROALB/CREAT RATIO: 0.8 mg/g (ref 0.0–30.0)

## 2016-03-13 LAB — HEMOGLOBIN A1C: HEMOGLOBIN A1C: 9.4 % — AB (ref 4.6–6.5)

## 2016-03-13 MED ORDER — CEPHALEXIN 500 MG PO CAPS: 500.0000 mg | ORAL_CAPSULE | Freq: Two times a day (BID) | ORAL | Status: DC

## 2016-03-13 NOTE — Assessment & Plan Note (Signed)
Fair control. Continue requip.

## 2016-03-13 NOTE — Assessment & Plan Note (Signed)
Fair control, managed by psych.

## 2016-03-13 NOTE — Assessment & Plan Note (Signed)
Stable, management per pain management.

## 2016-03-13 NOTE — Assessment & Plan Note (Signed)
Stable on current meds, continue same.  

## 2016-03-13 NOTE — Patient Instructions (Addendum)
Please complete lab work prior to leaving. Begin Keflex (antibiotic) for wound on your left big toe.  Call if you develop increased redness, pain, swelling or if not improved in 1 week. You will be contacted about scheduling your mammogram.  Please get your [place] notarized and provide [place] a copy for your chart.

## 2016-03-13 NOTE — Assessment & Plan Note (Signed)
Sugars are up.  Obtain A1C,urine microalbumin.  Continue current meds, adjust as needed based on A1c.

## 2016-03-13 NOTE — Progress Notes (Signed)
Subjective:    [name] is a 60 y.o. female who presents for Medicare Annual/Subsequent preventive examination.  Preventive Screening-Counseling & Management  Tobacco History  Smoking status  . Former Smoker -- 0.10 packs/day for 1 years  . Types: Cigarettes  . Quit date: 10/19/2012  Smokeless tobacco  . Never Used    Comment: using electronic cigarettes     Problems Prior to Visit 1. DM2- on januvia, metformin. Reports that she is checking sugars at home. Sugars have been in min 200's.  Not eating as well (dad died 2 weeks ago). Has eye exam tomorrow.   Lab Results  Component Value Date   HGBA1C 7.0* 04/26/2015   HGBA1C 6.5 01/19/2015   HGBA1C 6.2 09/14/2014   Lab Results  Component Value Date   MICROALBUR 1.7 04/26/2015   LDLCALC 121* 04/26/2015   CREATININE 0.76 04/26/2015   2.  Depression/anxiety- on pristiq, she is following with Dr. Mike Rowe (Psychiatry). Also seeing a therapist with same group.    3.  Interstitial cystitis- on myrbetriq for OAB.  Followed by Dr. Amalia Rowe who did botox which pt states is helping.    4.  Chronic pain management-  On oxycodone and duragesic.[name]. Reports pain is well managed.   5. RLS- maintained on requip. A few nights a month bothers her despite requip.   6. HTN- on amlodipine, losartan.  BP Readings from Last 3 Encounters:  03/13/16 137/76  12/16/15 150/74  07/29/15 156/78   Patient presents today for complete physical.  Immunizations: up to date Diet: needs improvement Exercise: some walking Colonoscopy: 2012 normal Dexa: 2016 Pap Smear: hysterectomy Mammogram: due in July.         Current Problems (verified) Patient Active Problem List   Diagnosis Date Noted  . Weight gain 04/26/2015  . Atypical chest pain 01/23/2015  . Abdominal pain 07/31/2014  . 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 Intertrigo 03/29/2014  . Delayed gastric emptying 07/14/2013  . Hot flashes 09/24/2012  . H/O abnormal Pap smear  06/24/2012  . OSA (obstructive sleep apnea) 08/27/2011  . Irritable bowel syndrome (IBS) 05/15/2011  . ARTHRITIS, KNEES, BILATERAL 10/17/2010  . Chronic pain syndrome 08/03/2010  . RESTLESS LEG SYNDROME 05/10/2010  . CARPAL TUNNEL SYNDROME, RIGHT 11/16/2009  . VITAMIN D DEFICIENCY 10/17/2009  . Hyperlipemia 10/11/2009  . ALLERGIC RHINITIS 10/11/2009  . GERD 10/11/2009  . INTERSTITIAL CYSTITIS 10/11/2009  . OSTEOPENIA 10/11/2009  . RENAL CALCULUS, HX OF 10/11/2009  . Diabetes type 2, controlled ([place]) 03/16/2008  . ANXIETY DISORDER 03/16/2008  . Depression 03/16/2008  . Essential hypertension 03/16/2008  . REFLUX ESOPHAGITIS 03/16/2008  . FIBROMYALGIA 03/16/2008    Medications Prior to Visit Current Outpatient Prescriptions on File Prior to Visit  Medication Sig Dispense Refill  . albuterol (PROVENTIL HFA;VENTOLIN HFA) 108 (90 Base) MCG/ACT inhaler Inhale 2 puffs into the lungs every 6 (six) hours as needed for wheezing or shortness of breath. 1 Inhaler 0  . ALPRAZolam (XANAX) 1 MG tablet Take 1 tablet (1 mg total) by mouth 3 (three) times daily as needed for anxiety. 30 tablet 0  . amLODipine (NORVASC) 10 MG tablet TAKE 1 TABLET BY MOUTH EVERY NIGHT AT BEDTIME 30 tablet 5  . aspirin EC 325 MG tablet Take 325 mg by mouth daily.    . benzonatate (TESSALON) 100 MG capsule Take 1 capsule (100 mg total) by mouth 3 (three) times daily as needed. 20 capsule 0  . Brexpiprazole 2 MG TABS Take 1 tablet by mouth daily.    Marland Kitchen  desvenlafaxine (PRISTIQ) 50 MG 24 hr tablet Take 100 mg by mouth daily.     . Diclofenac Sodium (PENNSAID) 2 % SOLN Place 1 application onto the skin at bedtime as needed (knee pain).    Marland Kitchen dicyclomine (BENTYL) 20 MG tablet Take 20 mg by mouth 3 (three) times daily as needed for spasms.    Marland Kitchen esomeprazole (NEXIUM) 40 MG capsule TAKE ONE CAPSULE BY MOUTH DAILY AT 12 NOON 30 capsule 5  . fentaNYL (DURAGESIC) 100 MCG/HR Place 1 patch (100 mcg total) onto the skin every other  day. 5 patch 0  . ferrous sulfate 325 (65 FE) MG tablet Take 325 mg by mouth daily with breakfast.    . furosemide (LASIX) 20 MG tablet TAKE 1 TABLET BY MOUTH DAILY 30 tablet 5  . glucose blood (ONE TOUCH ULTRA TEST) test strip Use as instructed. Dx: 250.00 100 each 6  . JANUVIA 100 MG tablet TAKE 1 TABLET BY MOUTH EVERY NIGHT AT BEDTIME 30 tablet 5  . lidocaine (LIDODERM) 5 % Place 2 patches onto the skin at bedtime as needed (hip pain).     Marland Kitchen losartan (COZAAR) 100 MG tablet TAKE 1 TABLET BY MOUTH DAILY 30 tablet 5  . metFORMIN (GLUCOPHAGE-XR) 500 MG 24 hr tablet TAKE 4 TABLETS BY MOUTH DAILY WITH BREAKFAST 120 tablet 5  . mirabegron ER (MYRBETRIQ) 50 MG TB24 Take 50 mg by mouth at bedtime.     Marland Kitchen nystatin cream (MYCOSTATIN) Apply 1 application topically 2 (two) times daily. 30 g 2  . Omega-3 Fatty Acids (FISH OIL) 1000 MG CAPS Take 1,000 mg by mouth daily.    . Oxycodone HCl 10 MG TABS Take 10 mg by mouth every 8 (eight) hours as needed.  0  . potassium gluconate 595 MG TABS tablet Take 595 mg by mouth daily as needed (leg cramps).    . promethazine (PHENERGAN) 25 MG tablet Take 25 mg by mouth daily as needed for nausea or vomiting.     Marland Kitchen rOPINIRole (REQUIP) 4 MG tablet TAKE 1 TABLET(4 MG) BY MOUTH AT BEDTIME 30 tablet 5  . tiZANidine (ZANAFLEX) 4 MG tablet Take 1 tablet (4 mg total) by mouth at bedtime. 30 tablet 0  . topiramate (TOPAMAX) 50 MG tablet Take 50 mg by mouth 2 (two) times daily as needed (headaches).     . traZODone (DESYREL) 100 MG tablet Take 100 mg by mouth at bedtime. Take 1 - 2 tablets at bedtime as needed.    Marland Kitchen UNABLE TO FIND C-Pap machine , use nightly    . valACYclovir (VALTREX) 1000 MG tablet Take 1 tablet (1,000 mg total) by mouth 3 (three) times daily. 30 tablet 0  . [DISCONTINUED] pravastatin (PRAVACHOL) 20 MG tablet Take 1 tablet (20 mg total) by mouth daily. 30 tablet 3   No current facility-administered medications on file prior to visit.    Current Medications  (verified) Current Outpatient Prescriptions  Medication Sig Dispense Refill  . albuterol (PROVENTIL HFA;VENTOLIN HFA) 108 (90 Base) MCG/ACT inhaler Inhale 2 puffs into the lungs every 6 (six) hours as needed for wheezing or shortness of breath. 1 Inhaler 0  . ALPRAZolam (XANAX) 1 MG tablet Take 1 tablet (1 mg total) by mouth 3 (three) times daily as needed for anxiety. 30 tablet 0  . amLODipine (NORVASC) 10 MG tablet TAKE 1 TABLET BY MOUTH EVERY NIGHT AT BEDTIME 30 tablet 5  . aspirin EC 325 MG tablet Take 325 mg by mouth daily.    Marland Kitchen  benzonatate (TESSALON) 100 MG capsule Take 1 capsule (100 mg total) by mouth 3 (three) times daily as needed. 20 capsule 0  . Brexpiprazole 2 MG TABS Take 1 tablet by mouth daily.    Marland Kitchen desvenlafaxine (PRISTIQ) 50 MG 24 hr tablet Take 100 mg by mouth daily.     . Diclofenac Sodium (PENNSAID) 2 % SOLN Place 1 application onto the skin at bedtime as needed (knee pain).    Marland Kitchen dicyclomine (BENTYL) 20 MG tablet Take 20 mg by mouth 3 (three) times daily as needed for spasms.    Marland Kitchen esomeprazole (NEXIUM) 40 MG capsule TAKE ONE CAPSULE BY MOUTH DAILY AT 12 NOON 30 capsule 5  . fentaNYL (DURAGESIC) 100 MCG/HR Place 1 patch (100 mcg total) onto the skin every other day. 5 patch 0  . ferrous sulfate 325 (65 FE) MG tablet Take 325 mg by mouth daily with breakfast.    . furosemide (LASIX) 20 MG tablet TAKE 1 TABLET BY MOUTH DAILY 30 tablet 5  . glucose blood (ONE TOUCH ULTRA TEST) test strip Use as instructed. Dx: 250.00 100 each 6  . JANUVIA 100 MG tablet TAKE 1 TABLET BY MOUTH EVERY NIGHT AT BEDTIME 30 tablet 5  . lidocaine (LIDODERM) 5 % Place 2 patches onto the skin at bedtime as needed (hip pain).     Marland Kitchen losartan (COZAAR) 100 MG tablet TAKE 1 TABLET BY MOUTH DAILY 30 tablet 5  . metFORMIN (GLUCOPHAGE-XR) 500 MG 24 hr tablet TAKE 4 TABLETS BY MOUTH DAILY WITH BREAKFAST 120 tablet 5  . mirabegron ER (MYRBETRIQ) 50 MG TB24 Take 50 mg by mouth at bedtime.     Marland Kitchen nystatin cream  (MYCOSTATIN) Apply 1 application topically 2 (two) times daily. 30 g 2  . Omega-3 Fatty Acids (FISH OIL) 1000 MG CAPS Take 1,000 mg by mouth daily.    . Oxycodone HCl 10 MG TABS Take 10 mg by mouth every 8 (eight) hours as needed.  0  . potassium gluconate 595 MG TABS tablet Take 595 mg by mouth daily as needed (leg cramps).    . promethazine (PHENERGAN) 25 MG tablet Take 25 mg by mouth daily as needed for nausea or vomiting.     Marland Kitchen rOPINIRole (REQUIP) 4 MG tablet TAKE 1 TABLET(4 MG) BY MOUTH AT BEDTIME 30 tablet 5  . tiZANidine (ZANAFLEX) 4 MG tablet Take 1 tablet (4 mg total) by mouth at bedtime. 30 tablet 0  . topiramate (TOPAMAX) 50 MG tablet Take 50 mg by mouth 2 (two) times daily as needed (headaches).     . traZODone (DESYREL) 100 MG tablet Take 100 mg by mouth at bedtime. Take 1 - 2 tablets at bedtime as needed.    Marland Kitchen UNABLE TO FIND C-Pap machine , use nightly    . valACYclovir (VALTREX) 1000 MG tablet Take 1 tablet (1,000 mg total) by mouth 3 (three) times daily. 30 tablet 0  . [DISCONTINUED] pravastatin (PRAVACHOL) 20 MG tablet Take 1 tablet (20 mg total) by mouth daily. 30 tablet 3   No current facility-administered medications for this visit.     Allergies (verified) Atorvastatin; Buprenorphine hcl; Chlorzoxazone; Gabapentin; Gemfibrozil; Metronidazole; Morphine and related; Nitrofurantoin; Olmesartan medoxomil-hctz; Ondansetron; Pentosan polysulfate sodium; Pravastatin; Propoxyphene n-acetaminophen; Ramipril; and Sulfonamide derivatives   PAST HISTORY  Family History Family History  Problem Relation Age of Onset  . Breast cancer Mother   . Dementia Mother   . Breast cancer Maternal Grandmother   . Throat cancer Maternal Grandmother   .  Esophageal cancer Maternal Grandmother   . Breast cancer Other     maternal great aunts x 5  . Rheum arthritis Other   . Colon cancer Neg Hx   . Diabetes Father   . Dementia Father   . Alzheimer's disease Father   . Diabetes Maternal  Grandfather     Social History Social History  Substance Use Topics  . Smoking status: Former Smoker -- 0.10 packs/day for 1 years    Types: Cigarettes    Quit date: 10/19/2012  . Smokeless tobacco: Never Used     Comment: using electronic cigarettes  . Alcohol Use: Yes     Comment: 2 drinks per month     Are there smokers in your home (other than you)? No  Risk Factors Current exercise habits: walks  Dietary issues discussed: healthy diet   Cardiac risk factors: advanced age (older than 9 for men, 74 for women), diabetes mellitus and hypertension.  Depression Screensee chart  Activities of Daily Living In your present state of health, do you have any difficulty performing the following activities?:  Driving? no Managing money?  No Feeding yourself? No Getting from bed to chair? No . Climbing a flight of stairs? No Preparing food and eating?: No Bathing or showering? No Getting dressed: No Getting to the toilet? No Using the toilet:No Moving around from place to place: No In the past year have you fallen or had a near fall?:No   Are you sexually active?  No  Do you have more than one partner?  No  Hearing Difficulties: No Do you often ask people to speak up or repeat themselves? No Do you experience ringing or noises in your ears? No Do you have difficulty understanding soft or whispered voices? No   Do you feel that you have a problem with memory? No sometimes attributes to worry and stress  Do you often misplace items? No  Do you feel safe at home?  Yes   Cognitive Testing  Alert? Yes  Normal Appearance?Yes  Oriented to person? Yes  Place? Yes   Time? Yes  Recall of three objects?  Yes  Can perform simple calculations? Yes  Displays appropriate judgment?Yes  Can read the correct time from a watch face?Yes   Advanced Directives have been discussed with the patient? Yes  List the Names of Other Physician/Practitioners you currently use: 1.  See  snapshot  Indicate any recent Medical Services you may have received from other than [facility] providers in the past year (date may be approximate).  Immunization History  Administered Date(s) Administered  . Influenza Split 08/03/2011, 07/29/2012  . Influenza Whole 08/19/2009  . Influenza,inj,Quad PF,36+ Mos 07/31/2013, 08/02/2014  . Influenza-Unspecified 08/30/2015  . Pneumococcal Polysaccharide-23 09/24/2012  . Tdap 09/24/2012  . Zoster 05/11/2015    Screening Tests Health Maintenance  Topic Date Due  . FOOT EXAM  12/30/2013  . HEMOGLOBIN A1C  10/26/2015  . OPHTHALMOLOGY EXAM  06/12/2016 (Originally 02/22/2016)  . INFLUENZA VACCINE  06/19/2016  . MAMMOGRAM  06/12/2017  . PNEUMOCOCCAL POLYSACCHARIDE VACCINE (2) 09/24/2017  . COLONOSCOPY  08/13/2021  . TETANUS/TDAP  09/24/2022  . Hepatitis C Screening  Completed  . HIV Screening  Completed    All answers were reviewed with the patient and necessary referrals were made:  O'SULLIVAN,Jagjit Riner S., NP   03/13/2016   History reviewed: allergies, current medications, past family history, past medical history, past social history, past surgical history and problem list  Review of Systems .ros  Review of Systems  Constitutional: Negative for weight loss.  HENT: Negative for hearing loss and tinnitus.   Eyes: Negative for blurred vision.  Respiratory: Negative for shortness of breath.   Cardiovascular: Negative for chest pain.  Gastrointestinal: Negative for constipation and blood in stool.       Diarrhea, chronic  Genitourinary:       See HPI  Musculoskeletal: Negative for joint pain.       Chronic fibromyalgia pain at baseline  Neurological:       Occasional Ha's  Psychiatric/Behavioral:       See HPI     Objective:     Body mass index is 42.5 kg/(m^2). BP 137/76 mmHg  Pulse 79  Temp(Src) 98.7 F (37.1 C) (Oral)  Resp 16  Ht 5\' 5"  (1.651 m)  Wt 255 lb 6.4 oz (115.849 kg)  BMI 42.50 kg/m2  SpO2 98%       Assessment:     Physical Exam  Constitutional: She is oriented to person, place, and time. She appears well-developed and well-nourished. No distress.  HENT:  Head: Normocephalic and atraumatic.  Right Ear: Tympanic membrane and ear canal normal.  Left Ear: Tympanic membrane and ear canal normal.  Mouth/Throat: Oropharynx is clear and moist.  Eyes: Pupils are equal, round, and reactive to light. No scleral icterus.  Neck: Normal range of motion. No thyromegaly present.  Cardiovascular: Normal rate and regular rhythm.   No murmur heard. Pulmonary/Chest: Effort normal and breath sounds normal. No respiratory distress. He has no wheezes. She has no rales. She exhibits no tenderness.  Abdominal: Soft. Bowel sounds are normal. He exhibits no distension and no mass. There is no tenderness. There is no rebound and no guarding.  Musculoskeletal: She exhibits no edema.  Lymphadenopathy:    She has no cervical adenopathy.  Neurological: She is alert and oriented to person, place, and time. She has normal patellar reflexes. She exhibits normal muscle tone. Coordination normal.  Skin: Skin is warm and dry.  Psychiatric: She has a normal mood and affect. Her behavior is normal. Judgment and thought content normal.  Breasts: Examined lying Right: Without masses, retractions, discharge or axillary adenopathy.  Left: Without masses, retractions, discharge or axillary adenopathy.          Assessment & Plan:         Plan:     During the course of the visit the patient was educated and counseled about appropriate screening and preventive services including:    Screening mammography  Advanced directives: Marland Kitchen  Diet review for nutrition referral? Yes ____  Not Indicated _x___ Reviewed diet   Patient Instructions (the written plan) was given to the patient.  Medicare Attestation I have personally reviewed: The patient's medical and social history Their use of alcohol, tobacco or  illicit drugs Their current medications and supplements The patient's functional ability including ADLs,fall risks, home safety risks, cognitive, and hearing and visual impairment Diet and physical activities Evidence for depression or mood disorders  The patient's weight, height, BMI, and visual acuity have been recorded in the chart.  I have made referrals, counseling, and provided education to the patient based on review of the above and I have provided the patient with a written personalized care plan for preventive services.     O'SULLIVAN,Terron Merfeld S., NP   03/13/2016

## 2016-03-14 ENCOUNTER — Telehealth: Payer: Self-pay | Admitting: Family

## 2016-03-14 MED ORDER — CANAGLIFLOZIN 100 MG PO TABS: 100.0000 mg | ORAL_TABLET | Freq: Every day | ORAL | Status: DC

## 2016-03-14 NOTE — Telephone Encounter (Signed)
Notified pt and she is agreeable to proceed with medication. 

## 2016-03-14 NOTE — Telephone Encounter (Signed)
Sugar is uncontrolled. Add invokana.

## 2016-03-22 ENCOUNTER — Telehealth: Payer: Self-pay | Admitting: Family

## 2016-03-22 MED ORDER — DICYCLOMINE HCL 20 MG PO TABS: 20.0000 mg | ORAL_TABLET | Freq: Four times a day (QID) | ORAL | Status: DC

## 2016-03-22 NOTE — Telephone Encounter (Signed)
Caller name:Self  Can be reached: [phone]   Pharmacy:  [place], [place] [phone] (Phone) [phone] (Fax)       Reason for call: Request refill on dicyclomine (BENTYL) 20 MG tablet [id number]

## 2016-03-22 NOTE — Telephone Encounter (Signed)
Last filled: Historical med Hx. IBS  Last OV: 03/13/16  Please advise.

## 2016-04-24 ENCOUNTER — Ambulatory Visit (INDEPENDENT_AMBULATORY_CARE_PROVIDER_SITE_OTHER): Payer: Medicare Other | Admitting: Family

## 2016-04-24 ENCOUNTER — Encounter: Payer: Self-pay | Admitting: Family

## 2016-04-24 ENCOUNTER — Ambulatory Visit (HOSPITAL_BASED_OUTPATIENT_CLINIC_OR_DEPARTMENT_OTHER)
Admission: RE | Admit: 2016-04-24 | Discharge: 2016-04-24 | Disposition: A | Discharge status: Home / Self Care | Payer: Medicare Other | Source: Ambulatory Visit | Attending: Family | Admitting: Family

## 2016-04-24 VITALS — BP 130/77 | HR 73 | Temp 98.6°F | Resp 18 | Ht 65.0 in | Wt 250.0 lb

## 2016-04-24 DIAGNOSIS — R1032 Left lower quadrant pain: Secondary | ICD-10-CM | POA: Diagnosis not present

## 2016-04-24 DIAGNOSIS — R1031 Right lower quadrant pain: Secondary | ICD-10-CM | POA: Diagnosis present

## 2016-04-24 DIAGNOSIS — R109 Unspecified abdominal pain: Secondary | ICD-10-CM | POA: Diagnosis not present

## 2016-04-24 DIAGNOSIS — F329 Major depressive disorder, single episode, unspecified: Secondary | ICD-10-CM

## 2016-04-24 DIAGNOSIS — K529 Noninfective gastroenteritis and colitis, unspecified: Secondary | ICD-10-CM | POA: Insufficient documentation

## 2016-04-24 DIAGNOSIS — K5732 Diverticulitis of large intestine without perforation or abscess without bleeding: Secondary | ICD-10-CM

## 2016-04-24 DIAGNOSIS — F32A Depression, unspecified: Secondary | ICD-10-CM

## 2016-04-24 LAB — CBC WITH DIFFERENTIAL/PLATELET
BASOS ABS: 0 10*3/uL (ref 0.0–0.1)
Basophils Relative: 0.3 % (ref 0.0–3.0)
EOS ABS: 0.1 10*3/uL (ref 0.0–0.7)
Eosinophils Relative: 2.1 % (ref 0.0–5.0)
HEMATOCRIT: 41.7 % (ref 36.0–46.0)
HEMOGLOBIN: 13.4 g/dL (ref 12.0–15.0)
LYMPHS PCT: 30.2 % (ref 12.0–46.0)
Lymphs Abs: 1.8 10*3/uL (ref 0.7–4.0)
MCHC: 32.2 g/dL (ref 30.0–36.0)
MCV: 80.9 fl (ref 78.0–100.0)
MONO ABS: 0.4 10*3/uL (ref 0.1–1.0)
Monocytes Relative: 6.3 % (ref 3.0–12.0)
Neutro Abs: 3.6 10*3/uL (ref 1.4–7.7)
Neutrophils Relative %: 61.1 % (ref 43.0–77.0)
Platelets: 230 10*3/uL (ref 150.0–400.0)
RBC: 5.15 Mil/uL — ABNORMAL HIGH (ref 3.87–5.11)
RDW: 14.8 % (ref 11.5–15.5)
WBC: 6 10*3/uL (ref 4.0–10.5)

## 2016-04-24 LAB — POCT URINALYSIS DIPSTICK
BILIRUBIN UA: NEGATIVE
Ketones, UA: NEGATIVE
Leukocytes, UA: NEGATIVE
NITRITE UA: NEGATIVE
PROTEIN UA: NEGATIVE
RBC UA: NEGATIVE
SPEC GRAV UA: 1.02
UROBILINOGEN UA: NEGATIVE
pH, UA: 6

## 2016-04-24 LAB — BASIC METABOLIC PANEL
BUN: 12 mg/dL (ref 6–23)
CALCIUM: 9.7 mg/dL (ref 8.4–10.5)
CO2: 32 mEq/L (ref 19–32)
Chloride: 101 mEq/L (ref 96–112)
Creatinine, Ser: 0.84 mg/dL (ref 0.40–1.20)
GFR: 73.81 mL/min (ref >60.00)
Glucose, Bld: 166 mg/dL — ABNORMAL HIGH (ref 70–99)
Potassium: 4.4 mEq/L (ref 3.5–5.1)
Sodium: 139 mEq/L (ref 135–145)

## 2016-04-24 LAB — HEPATIC FUNCTION PANEL
ALK PHOS: 79 U/L (ref 39–117)
ALT: 35 U/L (ref 0–35)
AST: 26 U/L (ref 0–37)
Albumin: 4.1 g/dL (ref 3.5–5.2)
BILIRUBIN DIRECT: 0.1 mg/dL (ref 0.0–0.3)
BILIRUBIN TOTAL: 0.4 mg/dL (ref 0.2–1.2)
TOTAL PROTEIN: 7 g/dL (ref 6.0–8.3)

## 2016-04-24 MED ORDER — IOPAMIDOL (ISOVUE-300) INJECTION 61%
100.0000 mL | Freq: Once | INTRAVENOUS | Status: AC | PRN
  Administered 2016-04-24: 100 mL via INTRAVENOUS

## 2016-04-24 MED ORDER — AMOXICILLIN-POT CLAVULANATE 875-125 MG PO TABS: 1.0000 | ORAL_TABLET | Freq: Two times a day (BID) | ORAL | Status: DC

## 2016-04-24 NOTE — Progress Notes (Signed)
Pre visit review using our clinic review tool, if applicable. No additional management support is needed unless otherwise documented below in the visit note. 

## 2016-04-24 NOTE — Patient Instructions (Addendum)
Please contact [facility] to schedule an appointment in [facility] [place] [facility]- [phone] Start augmentin for diverticulitis this afternoon.  Complete lab work prior to leaving. Complete CT scan on the first floor.

## 2016-04-24 NOTE — Progress Notes (Signed)
Subjective:    Patient ID: [name], female    DOB: 02/08/1956, 60 y.o.   MRN: [id number]  HPI   [name] is a 60 yr old female who presents today with chief complaint of left sided abdominal pain. Pain is described as dull and aching.Sharp on occasion. She denies known fever.  Denies nausea/vomitting. Reports she has had some diarrhea recently. Denies brbpr.  Pain is improve by laying flat.  Previous hx of diverticulitis. Pt reports that this feels the same as her previous diverticulitis.   Depression-  Reports that she was working with a therapist who quit.  Current psychiatrist is Dr. Mike Gip.     Review of Systems See HPI  Past Medical History  Diagnosis Date  . Diverticulosis   . Restless leg syndrome   . IBS (irritable bowel syndrome)   . IC (interstitial cystitis)   . Hypertension   . Diabetes mellitus     diet controlled  . Depression   . Fibromyalgia   . Anxiety   . Osteopenia   . Arthritis     in knees  . Dyslipidemia   . Chronic fatigue syndrome   . Chronic abdominal pain   . Esophageal reflux   . TIA (transient ischemic attack) 08/22/2012  . Obesity   . Sleep apnea      Social History   Social History  . Marital Status: Divorced    Spouse Name: N/A  . Number of Children: 2  . Years of Education: N/A   Occupational History  . disabled    Social History Main Topics  . Smoking status: Former Smoker -- 0.10 packs/day for 1 years    Types: Cigarettes    Quit date: 10/19/2012  . Smokeless tobacco: Never Used     Comment: using electronic cigarettes  . Alcohol Use: Yes     Comment: 2 drinks per month  . Drug Use: No  . Sexual Activity: Not on file   Other Topics Concern  . Not on file   Social History Narrative   Disabled   Divorced, lives alone - 1 dtr, 1 son    Past Surgical History  Procedure Laterality Date  . Total knee arthroplasty      bilateral  . Tonsillectomy and adenoidectomy    . Breast cyst excision  1978    left   . Laparotomy  1982    cyst removed from right ovary and removal of adhesions  . Abdominal exploration surgery  1983    diverticulum removed  . Appendectomy  1983    with exploration  . Laparoscopic incisional / umbilical / ventral hernia repair  1991, 2001, 2007    x 3  . Vaginal hysterectomy  1992  . Laparotomy  1991  . Scalp surgery  1993, 1998    Removal of tumor  . Nasal sinus surgery  2004  . Rectocele repair  2009  . Cervical fusion  2010  . Bladder hydrodistention  12/24/14    Family History  Problem Relation Age of Onset  . Breast cancer Mother   . Dementia Mother   . Breast cancer Maternal Grandmother   . Throat cancer Maternal Grandmother   . Esophageal cancer Maternal Grandmother   . Breast cancer Other     maternal great aunts x 5  . Rheum arthritis Other   . Colon cancer Neg Hx   . Diabetes Father   . Dementia Father   . Alzheimer's disease Father   .  Diabetes Maternal Grandfather     Allergies  Allergen Reactions  . Atorvastatin     Body aches, worsened interstitial cystitis.  . Buprenorphine Hcl Nausea And Vomiting  . Chlorzoxazone Other (See Comments) and Nausea Only    Unknown reaction  . Gabapentin Nausea And Vomiting  . Gemfibrozil Nausea And Vomiting  . Metronidazole Nausea And Vomiting  . Morphine And Related Nausea And Vomiting  . Nitrofurantoin Nausea And Vomiting  . Olmesartan Medoxomil-Hctz Nausea And Vomiting  . Ondansetron Nausea And Vomiting  . Pentosan Polysulfate Sodium Other (See Comments)    Unknown reaction  . Pravastatin Other (See Comments)    Body aches, worsening interstitial cystitis.  Marland Kitchen Propoxyphene N-Acetaminophen Nausea And Vomiting  . Ramipril Cough  . Sulfonamide Derivatives Nausea And Vomiting    Current Outpatient Prescriptions on File Prior to Visit  Medication Sig Dispense Refill  . ALPRAZolam (XANAX) 1 MG tablet Take 1 tablet (1 mg total) by mouth 3 (three) times daily as needed for anxiety. 30 tablet 0  .  amLODipine (NORVASC) 10 MG tablet TAKE 1 TABLET BY MOUTH EVERY NIGHT AT BEDTIME 30 tablet 5  . aspirin EC 325 MG tablet Take 325 mg by mouth daily.    . Brexpiprazole 2 MG TABS Take 1 tablet by mouth daily.    . canagliflozin (INVOKANA) 100 MG TABS tablet Take 1 tablet (100 mg total) by mouth daily before breakfast. 30 tablet 3  . desvenlafaxine (PRISTIQ) 50 MG 24 hr tablet Take 100 mg by mouth daily.     . Diclofenac Sodium (PENNSAID) 2 % SOLN Place 1 application onto the skin at bedtime as needed (knee pain).    Marland Kitchen dicyclomine (BENTYL) 20 MG tablet Take 1 tablet (20 mg total) by mouth every 6 (six) hours. 30 tablet 0  . esomeprazole (NEXIUM) 40 MG capsule TAKE ONE CAPSULE BY MOUTH DAILY AT 12 NOON 30 capsule 5  . fentaNYL (DURAGESIC) 100 MCG/HR Place 1 patch (100 mcg total) onto the skin every other day. 5 patch 0  . ferrous sulfate 325 (65 FE) MG tablet Take 325 mg by mouth daily with breakfast.    . furosemide (LASIX) 20 MG tablet TAKE 1 TABLET BY MOUTH DAILY 30 tablet 5  . glucose blood (ONE TOUCH ULTRA TEST) test strip Use as instructed. Dx: 250.00 100 each 6  . JANUVIA 100 MG tablet TAKE 1 TABLET BY MOUTH EVERY NIGHT AT BEDTIME 30 tablet 5  . lidocaine (LIDODERM) 5 % Place 2 patches onto the skin at bedtime as needed (hip pain).     Marland Kitchen losartan (COZAAR) 100 MG tablet TAKE 1 TABLET BY MOUTH DAILY 30 tablet 5  . metFORMIN (GLUCOPHAGE-XR) 500 MG 24 hr tablet TAKE 4 TABLETS BY MOUTH DAILY WITH BREAKFAST 120 tablet 5  . mirabegron ER (MYRBETRIQ) 50 MG TB24 Take 50 mg by mouth at bedtime.     Marland Kitchen nystatin cream (MYCOSTATIN) Apply 1 application topically 2 (two) times daily. 30 g 2  . Omega-3 Fatty Acids (FISH OIL) 1000 MG CAPS Take 1,000 mg by mouth daily.    . Oxycodone HCl 10 MG TABS Take 10 mg by mouth every 8 (eight) hours as needed.  0  . potassium gluconate 595 MG TABS tablet Take 595 mg by mouth daily as needed (leg cramps).    . promethazine (PHENERGAN) 25 MG tablet Take 25 mg by mouth  daily as needed for nausea or vomiting.     Marland Kitchen rOPINIRole (REQUIP) 4 MG tablet TAKE  1 TABLET(4 MG) BY MOUTH AT BEDTIME 30 tablet 5  . tiZANidine (ZANAFLEX) 4 MG tablet Take 1 tablet (4 mg total) by mouth at bedtime. 30 tablet 0  . topiramate (TOPAMAX) 50 MG tablet Take 50 mg by mouth 2 (two) times daily as needed (headaches).     . traZODone (DESYREL) 100 MG tablet Take 100 mg by mouth at bedtime. Take 1 - 2 tablets at bedtime as needed.    Marland Kitchen UNABLE TO FIND C-Pap machine , use nightly    . valACYclovir (VALTREX) 1000 MG tablet Take 1 tablet (1,000 mg total) by mouth 3 (three) times daily. 30 tablet 0  . [DISCONTINUED] pravastatin (PRAVACHOL) 20 MG tablet Take 1 tablet (20 mg total) by mouth daily. 30 tablet 3   No current facility-administered medications on file prior to visit.    BP 130/77 mmHg  Pulse 73  Temp(Src) 98.6 F (37 C) (Oral)  Resp 18  Ht 5\' 5"  (1.651 m)  Wt 250 lb (113.399 kg)  BMI 41.60 kg/m2  SpO2 99%       Objective:   Physical Exam  Constitutional: She is oriented to person, place, and time. She appears well-developed and well-nourished.  Cardiovascular: Normal rate, regular rhythm and normal heart sounds.   No murmur heard. Pulmonary/Chest: Effort normal and breath sounds normal. No respiratory distress. She has no wheezes.  Abdominal: Soft. There is tenderness in the left upper quadrant and left lower quadrant. There is no rigidity and no guarding.  Musculoskeletal: She exhibits no edema.  Neurological: She is alert and oriented to person, place, and time.  Psychiatric: She has a normal mood and affect. Her behavior is normal. Judgment and thought content normal.          Assessment & Plan:  Diverticulitis- symptoms/exam most consistent with diverticulitis. Pt is intolerant to metronidazole. Therefore will rx with augmentin. Check CMET, CBC, send for CT scan.  Depression- pt was given number for behavioral health to call.

## 2016-04-25 ENCOUNTER — Telehealth: Payer: Self-pay | Admitting: *Deleted

## 2016-04-25 NOTE — Telephone Encounter (Signed)
Pt called for CT results. Notified pt per verbal from PCP that CT shows colitis, no obstruction and to continue antibiotic. Follow up in office in 1 week and let us know is symptoms worsening or not improving.  Pt scheduled f/u with PA, Hassell Done on 05/01/16 at 2:l5pm. States that pain seems worse today but she is already on oxycodone and states she has a low pain tolerance. She will let us know if symptoms do not continue to improve.

## 2016-04-26 ENCOUNTER — Encounter: Payer: Self-pay | Admitting: Family

## 2016-04-26 NOTE — Telephone Encounter (Signed)
Noted  

## 2016-04-26 NOTE — Telephone Encounter (Signed)
Would you please contact pt tomorrow AM to follow up? If not improved we can work her into my schedule on Friday.

## 2016-04-26 NOTE — Telephone Encounter (Signed)
Pt reports that her symptoms have not improved and she has an appointment tomorrow afternoon at 1:30pm. I cancelled the appointment with Elyn Aquas on 05/01/16. Just making you aware.

## 2016-04-27 ENCOUNTER — Encounter: Payer: Self-pay | Admitting: Family

## 2016-04-27 ENCOUNTER — Ambulatory Visit (INDEPENDENT_AMBULATORY_CARE_PROVIDER_SITE_OTHER): Payer: Medicare Other | Admitting: Family

## 2016-04-27 ENCOUNTER — Encounter (INDEPENDENT_AMBULATORY_CARE_PROVIDER_SITE_OTHER): Payer: Self-pay

## 2016-04-27 VITALS — BP 120/80 | HR 84 | Temp 98.0°F | Resp 16 | Ht 65.0 in | Wt 247.8 lb

## 2016-04-27 DIAGNOSIS — K529 Noninfective gastroenteritis and colitis, unspecified: Secondary | ICD-10-CM

## 2016-04-27 NOTE — Progress Notes (Signed)
Pre visit review using our clinic review tool, if applicable. No additional management support is needed unless otherwise documented below in the visit note. 

## 2016-04-27 NOTE — Progress Notes (Signed)
Subjective:    Patient ID: [name], female    DOB: 1956/05/24, 60 y.o.   MRN: [id number]  HPI  [name] is a 60 yr old female who presents today for follow up of her colitis. She was seen on 04/24/16 with left sided colitis (verified by CT). She was placed on augmentin (due to metronidazole intolerance).  Reports + nausea, no vomitting.  Denies blood in stool.  Reports no BM yesterday- had soft BM today. No further diarrhea.    Review of Systems See HPI  Past Medical History  Diagnosis Date  . Diverticulosis   . Restless leg syndrome   . IBS (irritable bowel syndrome)   . IC (interstitial cystitis)   . Hypertension   . Diabetes mellitus     diet controlled  . Depression   . Fibromyalgia   . Anxiety   . Osteopenia   . Arthritis     in knees  . Dyslipidemia   . Chronic fatigue syndrome   . Chronic abdominal pain   . Esophageal reflux   . TIA (transient ischemic attack) 08/22/2012  . Obesity   . Sleep apnea      Social History   Social History  . Marital Status: Divorced    Spouse Name: N/A  . Number of Children: 2  . Years of Education: N/A   Occupational History  . disabled    Social History Main Topics  . Smoking status: Former Smoker -- 0.10 packs/day for 1 years    Types: Cigarettes    Quit date: 10/19/2012  . Smokeless tobacco: Never Used     Comment: using electronic cigarettes  . Alcohol Use: Yes     Comment: 2 drinks per month  . Drug Use: No  . Sexual Activity: Not on file   Other Topics Concern  . Not on file   Social History Narrative   Disabled   Divorced, lives alone - 1 dtr, 1 son    Past Surgical History  Procedure Laterality Date  . Total knee arthroplasty      bilateral  . Tonsillectomy and adenoidectomy    . Breast cyst excision  1978    left  . Laparotomy  1982    cyst removed from right ovary and removal of adhesions  . Abdominal exploration surgery  1983    diverticulum removed  . Appendectomy  1983    with  exploration  . Laparoscopic incisional / umbilical / ventral hernia repair  1991, 2001, 2007    x 3  . Vaginal hysterectomy  1992  . Laparotomy  1991  . Scalp surgery  1993, 1998    Removal of tumor  . Nasal sinus surgery  2004  . Rectocele repair  2009  . Cervical fusion  2010  . Bladder hydrodistention  12/24/14    Family History  Problem Relation Age of Onset  . Breast cancer Mother   . Dementia Mother   . Breast cancer Maternal Grandmother   . Throat cancer Maternal Grandmother   . Esophageal cancer Maternal Grandmother   . Breast cancer Other     maternal great aunts x 5  . Rheum arthritis Other   . Colon cancer Neg Hx   . Diabetes Father   . Dementia Father   . Alzheimer's disease Father   . Diabetes Maternal Grandfather     Allergies  Allergen Reactions  . Atorvastatin     Body aches, worsened interstitial cystitis.  . Buprenorphine Hcl  Nausea And Vomiting  . Chlorzoxazone Other (See Comments) and Nausea Only    Unknown reaction  . Gabapentin Nausea And Vomiting  . Gemfibrozil Nausea And Vomiting  . Metronidazole Nausea And Vomiting  . Morphine And Related Nausea And Vomiting  . Nitrofurantoin Nausea And Vomiting  . Olmesartan Medoxomil-Hctz Nausea And Vomiting  . Ondansetron Nausea And Vomiting  . Pentosan Polysulfate Sodium Other (See Comments)    Unknown reaction  . Pravastatin Other (See Comments)    Body aches, worsening interstitial cystitis.  Marland Kitchen Propoxyphene N-Acetaminophen Nausea And Vomiting  . Ramipril Cough  . Sulfonamide Derivatives Nausea And Vomiting    Current Outpatient Prescriptions on File Prior to Visit  Medication Sig Dispense Refill  . ALPRAZolam (XANAX) 1 MG tablet Take 1 tablet (1 mg total) by mouth 3 (three) times daily as needed for anxiety. 30 tablet 0  . amLODipine (NORVASC) 10 MG tablet TAKE 1 TABLET BY MOUTH EVERY NIGHT AT BEDTIME 30 tablet 5  . amoxicillin-clavulanate (AUGMENTIN) 875-125 MG tablet Take 1 tablet by mouth 2  (two) times daily. 20 tablet 0  . aspirin EC 325 MG tablet Take 325 mg by mouth daily.    . Brexpiprazole 2 MG TABS Take 1 tablet by mouth daily.    . canagliflozin (INVOKANA) 100 MG TABS tablet Take 1 tablet (100 mg total) by mouth daily before breakfast. 30 tablet 3  . desvenlafaxine (PRISTIQ) 50 MG 24 hr tablet Take 100 mg by mouth daily.     . Diclofenac Sodium (PENNSAID) 2 % SOLN Place 1 application onto the skin at bedtime as needed (knee pain).    Marland Kitchen dicyclomine (BENTYL) 20 MG tablet Take 1 tablet (20 mg total) by mouth every 6 (six) hours. 30 tablet 0  . esomeprazole (NEXIUM) 40 MG capsule TAKE ONE CAPSULE BY MOUTH DAILY AT 12 NOON 30 capsule 5  . fentaNYL (DURAGESIC) 100 MCG/HR Place 1 patch (100 mcg total) onto the skin every other day. 5 patch 0  . ferrous sulfate 325 (65 FE) MG tablet Take 325 mg by mouth daily with breakfast.    . furosemide (LASIX) 20 MG tablet TAKE 1 TABLET BY MOUTH DAILY 30 tablet 5  . glucose blood (ONE TOUCH ULTRA TEST) test strip Use as instructed. Dx: 250.00 100 each 6  . JANUVIA 100 MG tablet TAKE 1 TABLET BY MOUTH EVERY NIGHT AT BEDTIME 30 tablet 5  . lidocaine (LIDODERM) 5 % Place 2 patches onto the skin at bedtime as needed (hip pain).     Marland Kitchen losartan (COZAAR) 100 MG tablet TAKE 1 TABLET BY MOUTH DAILY 30 tablet 5  . metFORMIN (GLUCOPHAGE-XR) 500 MG 24 hr tablet TAKE 4 TABLETS BY MOUTH DAILY WITH BREAKFAST 120 tablet 5  . mirabegron ER (MYRBETRIQ) 50 MG TB24 Take 50 mg by mouth at bedtime.     Marland Kitchen nystatin cream (MYCOSTATIN) Apply 1 application topically 2 (two) times daily. 30 g 2  . Omega-3 Fatty Acids (FISH OIL) 1000 MG CAPS Take 1,000 mg by mouth daily.    . Oxycodone HCl 10 MG TABS Take 10 mg by mouth every 8 (eight) hours as needed.  0  . potassium gluconate 595 MG TABS tablet Take 595 mg by mouth daily as needed (leg cramps).    . promethazine (PHENERGAN) 25 MG tablet Take 25 mg by mouth daily as needed for nausea or vomiting.     Marland Kitchen rOPINIRole  (REQUIP) 4 MG tablet TAKE 1 TABLET(4 MG) BY MOUTH AT BEDTIME 30  tablet 5  . tiZANidine (ZANAFLEX) 4 MG tablet Take 1 tablet (4 mg total) by mouth at bedtime. 30 tablet 0  . topiramate (TOPAMAX) 50 MG tablet Take 50 mg by mouth 2 (two) times daily as needed (headaches).     . traZODone (DESYREL) 100 MG tablet Take 100 mg by mouth at bedtime. Take 1 - 2 tablets at bedtime as needed.    Marland Kitchen UNABLE TO FIND C-Pap machine , use nightly    . valACYclovir (VALTREX) 1000 MG tablet Take 1 tablet (1,000 mg total) by mouth 3 (three) times daily. 30 tablet 0  . [DISCONTINUED] pravastatin (PRAVACHOL) 20 MG tablet Take 1 tablet (20 mg total) by mouth daily. 30 tablet 3   No current facility-administered medications on file prior to visit.    BP 120/80 mmHg  Pulse 84  Temp(Src) 98 F (36.7 C) (Oral)  Resp 16  Ht 5\' 5"  (1.651 m)  Wt 247 lb 12.8 oz (112.401 kg)  BMI 41.24 kg/m2  SpO2 98%       Objective:   Physical Exam  Constitutional: She appears well-developed and well-nourished.  Cardiovascular: Normal rate, regular rhythm and normal heart sounds.   No murmur heard. Pulmonary/Chest: Effort normal and breath sounds normal. No respiratory distress. She has no wheezes.  Abdominal: Soft. Bowel sounds are normal. There is no rigidity, no rebound and no guarding.  Left upper and left lower abdominal tenderness is less pronounced today compared to last visit.   Psychiatric: She has a normal mood and affect. Her behavior is normal. Judgment and thought content normal.          Assessment & Plan:  Colitis- improving slowly.  She is tolerating augmentin.  No longer having diarrhea. Afebrile. Advised pt to continue augmentin and to call for follow up next week if she does not continue to improve. Otherwise, she is to complete the full 10 day course.

## 2016-04-27 NOTE — Patient Instructions (Addendum)
Please continue the augmentin to complete full 10 day course. If symptoms do not continue to improve, I would recommend that you schedule a follow up visit in our office next week.

## 2016-05-01 ENCOUNTER — Ambulatory Visit: Payer: Medicare Other | Admitting: Physician Assistant

## 2016-05-12 ENCOUNTER — Other Ambulatory Visit: Payer: Self-pay | Admitting: Family

## 2016-05-14 NOTE — Telephone Encounter (Signed)
Rx sent to the pharmacy by e-script.//AB/CMA 

## 2016-05-18 ENCOUNTER — Other Ambulatory Visit: Payer: Self-pay | Admitting: Family

## 2016-05-18 NOTE — Telephone Encounter (Signed)
Refill sent per [facility] refill protocol/SLS  

## 2016-06-05 ENCOUNTER — Other Ambulatory Visit: Payer: Self-pay | Admitting: Family

## 2016-06-06 ENCOUNTER — Telehealth: Payer: Self-pay | Admitting: Family

## 2016-06-06 NOTE — Telephone Encounter (Signed)
Relation to [id number] Call back number:[phone]   Reason for call:  Patient seeking clinical advice regarding diarrhea, patient states she cant make an appt due to her not being able to stay out the bathroom.please advise .sw

## 2016-06-06 NOTE — Telephone Encounter (Signed)
Noted and agree. 

## 2016-06-06 NOTE — Telephone Encounter (Signed)
Spoke with pt. States diarrhea has been constant since Sunday. BM mostly water and has had some mucus in stool. Reports that rectum is very irritated and has had some small blood on tissue when wiping. States she has already been taking imodium without relief. Has been drinking liquids with some toast.  Advised pt to be evaluated in the ER soon and she voices understanding.

## 2016-06-06 NOTE — Telephone Encounter (Signed)
If diarrhea does not improve, she should go to the ER. Drink plenty of fluids.  OK to use imodium as needed.

## 2016-06-06 NOTE — Telephone Encounter (Signed)
Melissa-- please advise dicyclomine refill?

## 2016-06-08 ENCOUNTER — Encounter (HOSPITAL_BASED_OUTPATIENT_CLINIC_OR_DEPARTMENT_OTHER): Payer: Self-pay | Admitting: *Deleted

## 2016-06-08 ENCOUNTER — Emergency Department (HOSPITAL_BASED_OUTPATIENT_CLINIC_OR_DEPARTMENT_OTHER)
Admission: EM | Admit: 2016-06-08 | Discharge: 2016-06-08 | Disposition: A | Discharge status: Home / Self Care | Payer: Medicare Other | Attending: Emergency Medicine | Admitting: Emergency Medicine

## 2016-06-08 DIAGNOSIS — Z87891 Personal history of nicotine dependence: Secondary | ICD-10-CM | POA: Diagnosis not present

## 2016-06-08 DIAGNOSIS — Z79899 Other long term (current) drug therapy: Secondary | ICD-10-CM | POA: Insufficient documentation

## 2016-06-08 DIAGNOSIS — Z6839 Body mass index (BMI) 39.0-39.9, adult: Secondary | ICD-10-CM | POA: Insufficient documentation

## 2016-06-08 DIAGNOSIS — E119 Type 2 diabetes mellitus without complications: Secondary | ICD-10-CM | POA: Insufficient documentation

## 2016-06-08 DIAGNOSIS — Z7982 Long term (current) use of aspirin: Secondary | ICD-10-CM | POA: Diagnosis not present

## 2016-06-08 DIAGNOSIS — E669 Obesity, unspecified: Secondary | ICD-10-CM | POA: Diagnosis not present

## 2016-06-08 DIAGNOSIS — R112 Nausea with vomiting, unspecified: Secondary | ICD-10-CM | POA: Diagnosis not present

## 2016-06-08 DIAGNOSIS — E785 Hyperlipidemia, unspecified: Secondary | ICD-10-CM | POA: Insufficient documentation

## 2016-06-08 DIAGNOSIS — Z8673 Personal history of transient ischemic attack (TIA), and cerebral infarction without residual deficits: Secondary | ICD-10-CM | POA: Insufficient documentation

## 2016-06-08 DIAGNOSIS — Z7984 Long term (current) use of oral hypoglycemic drugs: Secondary | ICD-10-CM | POA: Insufficient documentation

## 2016-06-08 DIAGNOSIS — R197 Diarrhea, unspecified: Secondary | ICD-10-CM | POA: Diagnosis not present

## 2016-06-08 DIAGNOSIS — I10 Essential (primary) hypertension: Secondary | ICD-10-CM | POA: Diagnosis not present

## 2016-06-08 DIAGNOSIS — F329 Major depressive disorder, single episode, unspecified: Secondary | ICD-10-CM | POA: Insufficient documentation

## 2016-06-08 MED ORDER — BISMUTH SUBSALICYLATE 262 MG/15ML PO SUSP: 30.0000 mL | Freq: Four times a day (QID) | ORAL | Status: AC | PRN

## 2016-06-08 MED ORDER — BISMUTH SUBSALICYLATE 262 MG/15ML PO SUSP
30.0000 mL | Freq: Once | ORAL | Status: DC
  Filled 2016-06-08: qty 118

## 2016-06-08 MED ORDER — CALCIUM POLYCARBOPHIL 625 MG PO TABS: 625.0000 mg | ORAL_TABLET | Freq: Every day | ORAL | Status: DC

## 2016-06-08 NOTE — ED Notes (Signed)
Pt with N/V/D x 5 days spoke to PCP who advised to come to ER. Denies abd pain fever or blood in stools

## 2016-06-08 NOTE — ED Provider Notes (Signed)
CSN: [id number]     Arrival date & time 06/08/16  1147 History   First MD Initiated Contact with Patient 06/08/16 1204     Chief Complaint  Patient presents with  . Emesis  . Diarrhea     (Consider location/radiation/quality/duration/timing/severity/associated sxs/prior Treatment) Patient is a 60 y.o. female presenting with vomiting and diarrhea. The history is provided by the patient.  Emesis Severity:  Moderate Duration:  1 day Timing:  Intermittent Number of daily episodes:  8, mostly at night Progression:  Unchanged Chronicity:  New Recent urination:  Normal Relieved by:  Nothing Associated symptoms: diarrhea   Diarrhea Associated symptoms: vomiting     Past Medical History  Diagnosis Date  . Diverticulosis   . Restless leg syndrome   . IBS (irritable bowel syndrome)   . IC (interstitial cystitis)   . Hypertension   . Diabetes mellitus     diet controlled  . Depression   . Fibromyalgia   . Anxiety   . Osteopenia   . Arthritis     in knees  . Dyslipidemia   . Chronic fatigue syndrome   . Chronic abdominal pain   . Esophageal reflux   . TIA (transient ischemic attack) 08/22/2012  . Obesity   . Sleep apnea    Past Surgical History  Procedure Laterality Date  . Total knee arthroplasty      bilateral  . Tonsillectomy and adenoidectomy    . Breast cyst excision  1978    left  . Laparotomy  1982    cyst removed from right ovary and removal of adhesions  . Abdominal exploration surgery  1983    diverticulum removed  . Appendectomy  1983    with exploration  . Laparoscopic incisional / umbilical / ventral hernia repair  1991, 2001, 2007    x 3  . Vaginal hysterectomy  1992  . Laparotomy  1991  . Scalp surgery  1993, 1998    Removal of tumor  . Nasal sinus surgery  2004  . Rectocele repair  2009  . Cervical fusion  2010  . Bladder hydrodistention  12/24/14   Family History  Problem Relation Age of Onset  . Breast cancer Mother   . Dementia Mother    . Breast cancer Maternal Grandmother   . Throat cancer Maternal Grandmother   . Esophageal cancer Maternal Grandmother   . Breast cancer Other     maternal great aunts x 5  . Rheum arthritis Other   . Colon cancer Neg Hx   . Diabetes Father   . Dementia Father   . Alzheimer's disease Father   . Diabetes Maternal Grandfather    Social History  Substance Use Topics  . Smoking status: Former Smoker -- 0.10 packs/day for 1 years    Types: Cigarettes    Quit date: 10/19/2012  . Smokeless tobacco: Never Used     Comment: using electronic cigarettes  . Alcohol Use: Yes     Comment: 2 drinks per month   OB History    No data available     Review of Systems  Gastrointestinal: Positive for vomiting and diarrhea.  All other systems reviewed and are negative.     Allergies  Atorvastatin; Buprenorphine hcl; Chlorzoxazone; Gabapentin; Gemfibrozil; Metronidazole; Morphine and related; Nitrofurantoin; Olmesartan medoxomil-hctz; Ondansetron; Pentosan polysulfate sodium; Pravastatin; Propoxyphene n-acetaminophen; Ramipril; and Sulfonamide derivatives  Home Medications   Prior to Admission medications   Medication Sig Start Date End Date Taking? Authorizing Provider  ALPRAZolam Duanne Moron)  1 MG tablet Take 1 tablet (1 mg total) by mouth 3 (three) times daily as needed for anxiety. 08/09/14   Velvet Bathe, MD  amLODipine (NORVASC) 10 MG tablet TAKE 1 TABLET BY MOUTH EVERY NIGHT AT BEDTIME 03/06/16   [name], NP  amoxicillin-clavulanate (AUGMENTIN) 875-125 MG tablet Take 1 tablet by mouth 2 (two) times daily. 04/24/16   [name], NP  aspirin EC 325 MG tablet Take 325 mg by mouth daily.    Historical Provider, MD  Brexpiprazole 2 MG TABS Take 1 tablet by mouth daily.    Historical Provider, MD  canagliflozin (INVOKANA) 100 MG TABS tablet Take 1 tablet (100 mg total) by mouth daily before breakfast. 03/14/16   [name], NP  desvenlafaxine (PRISTIQ) 50 MG 24 hr tablet  Take 100 mg by mouth daily.     Historical Provider, MD  Diclofenac Sodium (PENNSAID) 2 % SOLN Place 1 application onto the skin at bedtime as needed (knee pain).    Historical Provider, MD  dicyclomine (BENTYL) 20 MG tablet TAKE 1 TABLET(20 MG) BY MOUTH EVERY 6 HOURS 06/06/16   [name], NP  esomeprazole (NEXIUM) 40 MG capsule TAKE ONE CAPSULE BY MOUTH DAILY AT 12 NOON 12/26/15   [name], NP  fentaNYL (DURAGESIC) 100 MCG/HR Place 1 patch (100 mcg total) onto the skin every other day. 08/09/14   Velvet Bathe, MD  ferrous sulfate 325 (65 FE) MG tablet Take 325 mg by mouth daily with breakfast.    Historical Provider, MD  furosemide (LASIX) 20 MG tablet TAKE 1 TABLET BY MOUTH DAILY 07/29/15   [name], NP  glucose blood (ONE TOUCH ULTRA TEST) test strip Use as instructed. Dx: 250.00 07/17/13   [name], NP  JANUVIA 100 MG tablet TAKE 1 TABLET BY MOUTH EVERY NIGHT AT BEDTIME 02/22/16   [name], NP  lidocaine (LIDODERM) 5 % Place 2 patches onto the skin at bedtime as needed (hip pain).  03/26/13   Historical Provider, MD  losartan (COZAAR) 100 MG tablet TAKE 1 TABLET BY MOUTH DAILY 05/14/16   [name], NP  metFORMIN (GLUCOPHAGE-XR) 500 MG 24 hr tablet TAKE 4 TABLETS BY MOUTH DAILY WITH BREAKFAST 05/18/16   [name], NP  mirabegron ER (MYRBETRIQ) 50 MG TB24 Take 50 mg by mouth at bedtime.     Historical Provider, MD  nystatin cream (MYCOSTATIN) Apply 1 application topically 2 (two) times daily. 01/19/15   [name], NP  Omega-3 Fatty Acids (FISH OIL) 1000 MG CAPS Take 1,000 mg by mouth daily.    Historical Provider, MD  Oxycodone HCl 10 MG TABS Take 10 mg by mouth every 8 (eight) hours as needed. 04/16/15   Historical Provider, MD  potassium gluconate 595 MG TABS tablet Take 595 mg by mouth daily as needed (leg cramps).    Historical Provider, MD  promethazine (PHENERGAN) 25 MG tablet Take 25 mg by mouth daily as needed for nausea or  vomiting.  08/03/11   Rowe Clack, MD  rOPINIRole (REQUIP) 4 MG tablet TAKE 1 TABLET(4 MG) BY MOUTH AT BEDTIME 03/06/16   [name], NP  tiZANidine (ZANAFLEX) 4 MG tablet Take 1 tablet (4 mg total) by mouth at bedtime. 08/09/14   Velvet Bathe, MD  topiramate (TOPAMAX) 50 MG tablet Take 50 mg by mouth 2 (two) times daily as needed (headaches).  03/31/14   [name], NP  traZODone (DESYREL) 100 MG tablet Take 100 mg by mouth at bedtime. Take 1 - 2 tablets  at bedtime as needed.    Historical Provider, MD  UNABLE TO FIND C-Pap machine , use nightly    Historical Provider, MD  valACYclovir (VALTREX) 1000 MG tablet Take 1 tablet (1,000 mg total) by mouth 3 (three) times daily. 05/19/15   Yvonne R Lowne Chase, DO   BP 144/84 mmHg  Pulse 82  Temp(Src) 98.6 F (37 C) (Oral)  Resp 18  Ht 5\' 6"  (1.676 m)  Wt 245 lb (111.131 kg)  BMI 39.56 kg/m2  SpO2 97% Physical Exam  Constitutional: She is oriented to person, place, and time. She appears well-developed and well-nourished. No distress.  HENT:  Head: Normocephalic.  Mouth/Throat: Oropharynx is clear and moist and mucous membranes are normal.  Eyes: Conjunctivae are normal.  Neck: Neck supple. No tracheal deviation present.  Cardiovascular: Normal rate and regular rhythm.   Pulmonary/Chest: Effort normal. No respiratory distress.  Abdominal: Soft. She exhibits no distension. There is no tenderness. There is no rebound and no guarding.  Neurological: She is alert and oriented to person, place, and time.  Skin: Skin is warm and dry.  Psychiatric: She has a normal mood and affect.    ED Course  Procedures (including critical care time) Labs Review Labs Reviewed - No data to display  Imaging Review No results found. I have personally reviewed and evaluated these images and lab results as part of my medical decision-making.   EKG Interpretation None      MDM   Final diagnoses:  Nausea vomiting and diarrhea     60 year old female presents with 5 days of nausea vomiting and diarrhea. She was told by her primary care physician to come in for IV fluids. On arrival she shows no objective signs of dehydration. Her vital signs are normal she is afebrile, she has no abdominal tenderness or other indication for lab workup currently as this appears to be an uncomplicated GI upset. She has been taking loperamide without any relief so we will try Pepto-Bismol and fiber supplementation to help regulate her bowels more appropriately. Plan to follow up with PCP as needed and return precautions discussed for worsening or new concerning symptoms.     Leo Grosser, MD 06/08/16 [phone]

## 2016-06-08 NOTE — Discharge Instructions (Signed)

## 2016-06-08 NOTE — ED Notes (Signed)
MD at bedside. 

## 2016-06-14 ENCOUNTER — Other Ambulatory Visit: Payer: Self-pay | Admitting: Family

## 2016-06-29 ENCOUNTER — Other Ambulatory Visit: Payer: Self-pay | Admitting: Family

## 2016-07-05 ENCOUNTER — Ambulatory Visit: Payer: Medicare Other | Admitting: Licensed Clinical Social Worker

## 2016-07-17 ENCOUNTER — Other Ambulatory Visit: Payer: Self-pay | Admitting: Family

## 2016-07-24 ENCOUNTER — Ambulatory Visit (HOSPITAL_BASED_OUTPATIENT_CLINIC_OR_DEPARTMENT_OTHER): Payer: Medicare Other

## 2016-07-30 ENCOUNTER — Telehealth: Payer: Self-pay | Admitting: Family

## 2016-07-30 NOTE — Telephone Encounter (Signed)
30 day supply of invokana sent to pharmacy. Pt is due for follow up with Melissa.  Please call pt to schedule appointment soon. Thanks!

## 2016-08-03 NOTE — Telephone Encounter (Signed)
Called pt. Made her aware of Rx. Pt has been scheduled.

## 2016-08-07 ENCOUNTER — Ambulatory Visit: Payer: Medicare Other | Admitting: Family

## 2016-08-11 ENCOUNTER — Other Ambulatory Visit: Payer: Self-pay | Admitting: Family

## 2016-08-14 ENCOUNTER — Encounter: Payer: Self-pay | Admitting: Family

## 2016-08-14 ENCOUNTER — Ambulatory Visit (INDEPENDENT_AMBULATORY_CARE_PROVIDER_SITE_OTHER): Payer: Medicare Other | Admitting: Family

## 2016-08-14 VITALS — BP 127/87 | HR 87 | Temp 98.3°F | Resp 18 | Ht 65.0 in | Wt 248.2 lb

## 2016-08-14 DIAGNOSIS — F329 Major depressive disorder, single episode, unspecified: Secondary | ICD-10-CM

## 2016-08-14 DIAGNOSIS — F32A Depression, unspecified: Secondary | ICD-10-CM

## 2016-08-14 DIAGNOSIS — E785 Hyperlipidemia, unspecified: Secondary | ICD-10-CM

## 2016-08-14 DIAGNOSIS — I10 Essential (primary) hypertension: Secondary | ICD-10-CM

## 2016-08-14 DIAGNOSIS — F411 Generalized anxiety disorder: Secondary | ICD-10-CM

## 2016-08-14 DIAGNOSIS — M25511 Pain in right shoulder: Secondary | ICD-10-CM

## 2016-08-14 DIAGNOSIS — Z23 Encounter for immunization: Secondary | ICD-10-CM | POA: Diagnosis not present

## 2016-08-14 DIAGNOSIS — E1165 Type 2 diabetes mellitus with hyperglycemia: Secondary | ICD-10-CM | POA: Diagnosis not present

## 2016-08-14 DIAGNOSIS — IMO0001 Reserved for inherently not codable concepts without codable children: Secondary | ICD-10-CM

## 2016-08-14 DIAGNOSIS — G894 Chronic pain syndrome: Secondary | ICD-10-CM

## 2016-08-14 DIAGNOSIS — E119 Type 2 diabetes mellitus without complications: Secondary | ICD-10-CM

## 2016-08-14 MED ORDER — GLUCOSE BLOOD VI STRP: ORAL_STRIP | 6 refills | Status: DC

## 2016-08-14 MED ORDER — ONETOUCH ULTRASOFT LANCETS MISC
12 refills | Status: DC
Start: 2016-08-14 — End: 2017-02-01

## 2016-08-14 NOTE — Progress Notes (Signed)
Subjective:    Patient ID: [name], female    DOB: 1956-11-14, 60 y.o.   MRN: [id number]  HPI  [name] is a 60 yr old female who presents today for follow up.  1) Anxiety/depression- maintained on pristiq. Following with psychiatry, her psychiatrist left. She is working with the [facility] Counselor Sherilyn Cooter) and is scheduled to begin with another pschriatrist with [place]. Reports that her depression has been pretty well controlled. Mother has dementia and this is causing her some stress.   2) HTN- maintained on lasix and losartan. BP Readings from Last 3 Encounters:  08/14/16 127/87  06/08/16 144/84  04/27/16 120/80   3) DM2- on metformin, invokana, januvia. Reports that her sugars have been 130-160.   Lab Results  Component Value Date   HGBA1C 9.4 (H) 03/13/2016   HGBA1C 7.0 (H) 04/26/2015   HGBA1C 6.5 01/19/2015   Lab Results  Component Value Date   MICROALBUR 0.7 03/13/2016   LDLCALC 121 (H) 04/26/2015   CREATININE 0.84 04/24/2016   4) R shoulder pain- has been present x 2 months. Hurts to lay on the right side or to reach around to attach her bra.    5) Chronic pain- followed by Sherilyn Cooter.   Review of Systems See HPI  Past Medical History:  Diagnosis Date  . Anxiety   . Arthritis    in knees  . Chronic abdominal pain   . Chronic fatigue syndrome   . Depression   . Diabetes mellitus    diet controlled  . Diverticulosis   . Dyslipidemia   . Esophageal reflux   . Fibromyalgia   . Hypertension   . IBS (irritable bowel syndrome)   . IC (interstitial cystitis)   . Obesity   . Osteopenia   . Restless leg syndrome   . Sleep apnea   . TIA (transient ischemic attack) 08/22/2012     Social History   Social History  . Marital status: Divorced    Spouse name: N/A  . Number of children: 2  . Years of education: N/A   Occupational History  . disabled    Social History Main Topics  . Smoking status: Former Smoker   Packs/day: 0.10    Years: 1.00    Types: Cigarettes    Quit date: 10/19/2012  . Smokeless tobacco: Never Used     Comment: using electronic cigarettes  . Alcohol use Yes     Comment: 2 drinks per month  . Drug use: No  . Sexual activity: Not on file   Other Topics Concern  . Not on file   Social History Narrative   Disabled   Divorced, lives alone - 1 dtr, 1 son    Past Surgical History:  Procedure Laterality Date  . ABDOMINAL EXPLORATION SURGERY  1983   diverticulum removed  . APPENDECTOMY  1983   with exploration  . bladder hydrodistention  12/24/14  . BREAST CYST EXCISION  1978   left  . CERVICAL FUSION  2010  . LAPAROSCOPIC INCISIONAL / UMBILICAL / [place], 2001, 2007   x 3  . LAPAROTOMY  1982   cyst removed from right ovary and removal of adhesions  . LAPAROTOMY  1991  . NASAL SINUS SURGERY  2004  . RECTOCELE REPAIR  2009  . [place]   Removal of tumor  . TONSILLECTOMY AND ADENOIDECTOMY    . TOTAL KNEE ARTHROPLASTY  bilateral  . VAGINAL HYSTERECTOMY  1992    Family History  Problem Relation Age of Onset  . Breast cancer Mother   . Dementia Mother   . Breast cancer Maternal Grandmother   . Throat cancer Maternal Grandmother   . Esophageal cancer Maternal Grandmother   . Breast cancer Other     maternal great aunts x 5  . Rheum arthritis Other   . Colon cancer Neg Hx   . Diabetes Father   . Dementia Father   . Alzheimer's disease Father   . Diabetes Maternal Grandfather     Allergies  Allergen Reactions  . Atorvastatin     Body aches, worsened interstitial cystitis.  . Buprenorphine Hcl Nausea And Vomiting  . Chlorzoxazone Other (See Comments) and Nausea Only    Unknown reaction  . Gabapentin Nausea And Vomiting  . Gemfibrozil Nausea And Vomiting  . Metronidazole Nausea And Vomiting  . Morphine And Related Nausea And Vomiting  . Nitrofurantoin Nausea And Vomiting  . Olmesartan Medoxomil-Hctz Nausea And  Vomiting  . Ondansetron Nausea And Vomiting  . Pentosan Polysulfate Sodium Other (See Comments)    Unknown reaction  . Pravastatin Other (See Comments)    Body aches, worsening interstitial cystitis.  Marland Kitchen Propoxyphene N-Acetaminophen Nausea And Vomiting  . Ramipril Cough  . Sulfonamide Derivatives Nausea And Vomiting    Current Outpatient Prescriptions on File Prior to Visit  Medication Sig Dispense Refill  . ALPRAZolam (XANAX) 1 MG tablet Take 1 tablet (1 mg total) by mouth 3 (three) times daily as needed for anxiety. 30 tablet 0  . amLODipine (NORVASC) 10 MG tablet TAKE 1 TABLET BY MOUTH EVERY NIGHT AT BEDTIME 30 tablet 5  . aspirin EC 325 MG tablet Take 325 mg by mouth daily.    Marland Kitchen bismuth subsalicylate (PEPTO-BISMOL) 262 MG/15ML suspension Take 30 mLs by mouth every 6 (six) hours as needed. 360 mL 0  . Brexpiprazole 2 MG TABS Take 1 tablet by mouth daily.    Marland Kitchen desvenlafaxine (PRISTIQ) 50 MG 24 hr tablet Take 100 mg by mouth daily.     . Diclofenac Sodium (PENNSAID) 2 % SOLN Place 1 application onto the skin at bedtime as needed (knee pain).    Marland Kitchen dicyclomine (BENTYL) 20 MG tablet TAKE 1 TABLET(20 MG) BY MOUTH EVERY 6 HOURS 30 tablet 0  . esomeprazole (NEXIUM) 40 MG capsule TAKE ONE CAPSULE BY MOUTH DAILY AT NOON 30 capsule 2  . fentaNYL (DURAGESIC) 100 MCG/HR Place 1 patch (100 mcg total) onto the skin every other day. 5 patch 0  . ferrous sulfate 325 (65 FE) MG tablet Take 325 mg by mouth daily with breakfast.    . furosemide (LASIX) 20 MG tablet TAKE 1 TABLET BY MOUTH DAILY 30 tablet 5  . INVOKANA 100 MG TABS tablet TAKE 1 TABLET(100 MG) BY MOUTH DAILY BEFORE BREAKFAST 30 tablet 0  . JANUVIA 100 MG tablet TAKE 1 TABLET BY MOUTH EVERY NIGHT AT BEDTIME 30 tablet 0  . lidocaine (LIDODERM) 5 % Place 2 patches onto the skin at bedtime as needed (hip pain).     Marland Kitchen losartan (COZAAR) 100 MG tablet TAKE 1 TABLET BY MOUTH DAILY 30 tablet 5  . metFORMIN (GLUCOPHAGE-XR) 500 MG 24 hr tablet TAKE 4  TABLETS BY MOUTH DAILY WITH BREAKFAST 120 tablet 0  . mirabegron ER (MYRBETRIQ) 50 MG TB24 Take 50 mg by mouth at bedtime.     Marland Kitchen nystatin cream (MYCOSTATIN) Apply 1 application topically 2 (two) times  daily. 30 g 2  . Omega-3 Fatty Acids (FISH OIL) 1000 MG CAPS Take 1,000 mg by mouth daily.    . Oxycodone HCl 10 MG TABS Take 10 mg by mouth every 8 (eight) hours as needed.  0  . polycarbophil (FIBERCON) 625 MG tablet Take 1 tablet (625 mg total) by mouth daily. 30 tablet 0  . potassium gluconate 595 MG TABS tablet Take 595 mg by mouth daily as needed (leg cramps).    . promethazine (PHENERGAN) 25 MG tablet Take 25 mg by mouth daily as needed for nausea or vomiting.     Marland Kitchen rOPINIRole (REQUIP) 4 MG tablet TAKE 1 TABLET(4 MG) BY MOUTH AT BEDTIME 30 tablet 0  . tiZANidine (ZANAFLEX) 4 MG tablet Take 1 tablet (4 mg total) by mouth at bedtime. 30 tablet 0  . topiramate (TOPAMAX) 50 MG tablet Take 50 mg by mouth 2 (two) times daily as needed (headaches).     . traZODone (DESYREL) 100 MG tablet Take 100 mg by mouth at bedtime. Take 1 - 2 tablets at bedtime as needed.    Marland Kitchen UNABLE TO FIND C-Pap machine , use nightly    . valACYclovir (VALTREX) 1000 MG tablet Take 1 tablet (1,000 mg total) by mouth 3 (three) times daily. 30 tablet 0  . [DISCONTINUED] pravastatin (PRAVACHOL) 20 MG tablet Take 1 tablet (20 mg total) by mouth daily. 30 tablet 3   No current facility-administered medications on file prior to visit.     BP 127/87 (BP Location: Left Arm, Cuff Size: Large)   Pulse 87   Temp 98.3 F (36.8 C) (Oral)   Resp 18   Ht 5\' 5"  (1.651 m)   Wt 248 lb 3.2 oz (112.6 kg)   SpO2 98% Comment: room air  BMI 41.30 kg/m       Objective:   Physical Exam  Constitutional: She is oriented to person, place, and time. She appears well-developed and well-nourished.  HENT:  Head: Normocephalic and atraumatic.  Cardiovascular: Normal rate, regular rhythm and normal heart sounds.   No murmur  heard. Pulmonary/Chest: Effort normal and breath sounds normal. No respiratory distress. She has no wheezes.  Musculoskeletal: She exhibits no edema.  Neurological: She is alert and oriented to person, place, and time.  Skin: Skin is warm and dry.  Psychiatric: She has a normal mood and affect. Her behavior is normal. Judgment and thought content normal.          Assessment & Plan:  Flu shot today.   r shoulder pain- will refer to sports medicine for further evaluation given duration of symptoms.

## 2016-08-14 NOTE — Patient Instructions (Signed)
Please complete lab work prior to leaving. You will be contacted about your referral for your shoulder to sports medicin.  Follow up in 3 months, sooner if problems/concerns.

## 2016-08-14 NOTE — Progress Notes (Signed)
Pre visit review using our clinic review tool, if applicable. No additional management support is needed unless otherwise documented below in the visit note. 

## 2016-08-15 ENCOUNTER — Telehealth: Payer: Self-pay | Admitting: Family

## 2016-08-15 LAB — LIPID PANEL
CHOL/HDL RATIO: 4
CHOLESTEROL: 203 mg/dL — AB (ref 0–200)
HDL: 57.6 mg/dL (ref >39.00)
LDL CALC: 118 mg/dL — AB (ref 0–99)
NonHDL: 145.81
TRIGLYCERIDES: 140 mg/dL (ref 0.0–149.0)
VLDL: 28 mg/dL (ref 0.0–40.0)

## 2016-08-15 LAB — BASIC METABOLIC PANEL
BUN: 12 mg/dL (ref 6–23)
CHLORIDE: 97 meq/L (ref 96–112)
CO2: 31 meq/L (ref 19–32)
CREATININE: 0.92 mg/dL (ref 0.40–1.20)
Calcium: 9.3 mg/dL (ref 8.4–10.5)
GFR: 66.39 mL/min (ref >60.00)
Glucose, Bld: 126 mg/dL — ABNORMAL HIGH (ref 70–99)
POTASSIUM: 4.4 meq/L (ref 3.5–5.1)
Sodium: 136 mEq/L (ref 135–145)

## 2016-08-15 LAB — HEMOGLOBIN A1C: HEMOGLOBIN A1C: 7.3 % — AB (ref 4.6–6.5)

## 2016-08-15 MED ORDER — CANAGLIFLOZIN 300 MG PO TABS
300.0000 mg | ORAL_TABLET | Freq: Every day | ORAL | 3 refills | Status: DC
Start: 2016-08-15 — End: 2016-09-07

## 2016-08-15 NOTE — Telephone Encounter (Signed)
Left message on voicemail to return my call.  

## 2016-08-15 NOTE — Telephone Encounter (Signed)
[name] is improving.  Just slightly above goal. I would like her to increase invokana from 100mg  to 300mg . LDL above goal.  Continue to work on low fat/low cholesterol diet, exercise, weight loss.

## 2016-08-15 NOTE — Assessment & Plan Note (Signed)
Stable, she is maintained on pristiq and continues to work with psychiatry and a Social worker.

## 2016-08-15 NOTE — Assessment & Plan Note (Signed)
Stable, continues to follow with pain management.

## 2016-08-15 NOTE — Assessment & Plan Note (Signed)
Per report sugars have been stable at home. Continue current meds. Obtain follow up A1C.

## 2016-08-15 NOTE — Telephone Encounter (Signed)
Notified pt and she voices understanding. 

## 2016-08-15 NOTE — Assessment & Plan Note (Signed)
Stable, management per psychiatry.

## 2016-08-15 NOTE — Assessment & Plan Note (Signed)
Stable on current meds. Continue same. Obtain bmet. 

## 2016-08-17 ENCOUNTER — Ambulatory Visit: Payer: Medicare Other | Admitting: Family Medicine

## 2016-08-31 ENCOUNTER — Other Ambulatory Visit: Payer: Self-pay | Admitting: Family

## 2016-09-01 ENCOUNTER — Other Ambulatory Visit: Payer: Self-pay | Admitting: Family

## 2016-09-04 ENCOUNTER — Encounter: Payer: Self-pay | Admitting: Family Medicine

## 2016-09-04 ENCOUNTER — Ambulatory Visit (INDEPENDENT_AMBULATORY_CARE_PROVIDER_SITE_OTHER): Payer: Medicare Other | Admitting: Family Medicine

## 2016-09-04 VITALS — BP 133/83 | HR 76 | Ht 66.0 in | Wt 247.0 lb

## 2016-09-04 DIAGNOSIS — M25511 Pain in right shoulder: Secondary | ICD-10-CM

## 2016-09-04 MED ORDER — METHYLPREDNISOLONE ACETATE 40 MG/ML IJ SUSP
40.0000 mg | Freq: Once | INTRAMUSCULAR | Status: AC
  Administered 2016-09-04: 40 mg

## 2016-09-04 NOTE — Patient Instructions (Signed)
You have rotator cuff tendinitis, impingement. Try to avoid painful activities (overhead activities, lifting with extended arm) as much as possible. Aleve 2 tabs twice a day with food OR ibuprofen 3 tabs three times a day with food for pain and inflammation as needed if you can take anti-inflammatories. Take your pain medication as you have been. Subacromial injection may be beneficial to help with pain and to decrease inflammation - you were given this today. Consider physical therapy with transition to home exercise program. Do home exercise program with theraband and scapular stabilization exercises daily - these are very important for long term relief even if an injection was given.  Work your way up to 3 sets of 10 once a day. If not improving at follow-up we will consider further imaging, physical therapy, and/or nitro patches. Follow up with me in 1 month for reevaluation.

## 2016-09-07 ENCOUNTER — Other Ambulatory Visit: Payer: Self-pay | Admitting: Family

## 2016-09-07 DIAGNOSIS — M25511 Pain in right shoulder: Secondary | ICD-10-CM | POA: Insufficient documentation

## 2016-09-07 NOTE — Assessment & Plan Note (Signed)
2/2 rotator cuff tendinitis, impingement.  Shown home exercises to do daily.  Subacromial injection given as well.  Aleve or ibuprofen if needed.  Consider physical therapy, MRI/ultrasound, nitro patches if not improving at follow up in 1 month.  After informed written consent, patient was seated on exam table. Right shoulder was prepped with alcohol swab and utilizing posterior approach, patient's right subacromial space was injected with 3:1 marcaine: depomedrol. Patient tolerated the procedure well without immediate complications.

## 2016-09-07 NOTE — Progress Notes (Signed)
PCP and consultation requested by: [name], NP  Subjective:   HPI: Patient is a 60 y.o. female here for right shoulder pain.  Patient reports she's had 2 1/2 months of lateral right shoulder pain. Pain level 9/10, sharp. Worse with picking up items and reaching behind her. Tried aleve, uses fentanyl patch and oxycodone for chronic pain. No known injury or trauma. No skin changes, numbness though this does burn locally.  Past Medical History:  Diagnosis Date  . Anxiety   . Arthritis    in knees  . Chronic abdominal pain   . Chronic fatigue syndrome   . Depression   . Diabetes mellitus    diet controlled  . Diverticulosis   . Dyslipidemia   . Esophageal reflux   . Fibromyalgia   . Hypertension   . IBS (irritable bowel syndrome)   . IC (interstitial cystitis)   . Obesity   . Osteopenia   . Restless leg syndrome   . Sleep apnea   . TIA (transient ischemic attack) 08/22/2012    Current Outpatient Prescriptions on File Prior to Visit  Medication Sig Dispense Refill  . ALPRAZolam (XANAX) 1 MG tablet Take 1 tablet (1 mg total) by mouth 3 (three) times daily as needed for anxiety. 30 tablet 0  . amLODipine (NORVASC) 10 MG tablet TAKE 1 TABLET BY MOUTH EVERY NIGHT AT BEDTIME 30 tablet 0  . aspirin EC 325 MG tablet Take 325 mg by mouth daily.    Marland Kitchen bismuth subsalicylate (PEPTO-BISMOL) 262 MG/15ML suspension Take 30 mLs by mouth every 6 (six) hours as needed. 360 mL 0  . Brexpiprazole 2 MG TABS Take 1 tablet by mouth daily.    . Diclofenac Sodium (PENNSAID) 2 % SOLN Place 1 application onto the skin at bedtime as needed (knee pain).    Marland Kitchen dicyclomine (BENTYL) 20 MG tablet TAKE 1 TABLET(20 MG) BY MOUTH EVERY 6 HOURS 30 tablet 0  . esomeprazole (NEXIUM) 40 MG capsule TAKE ONE CAPSULE BY MOUTH DAILY AT NOON 30 capsule 2  . fentaNYL (DURAGESIC) 100 MCG/HR Place 1 patch (100 mcg total) onto the skin every other day. 5 patch 0  . ferrous sulfate 325 (65 FE) MG tablet Take  325 mg by mouth daily with breakfast.    . furosemide (LASIX) 20 MG tablet TAKE 1 TABLET BY MOUTH DAILY 30 tablet 0  . glucose blood (ONE TOUCH ULTRA TEST) test strip Use as instructed. Dx: 250.00 100 each 6  . JANUVIA 100 MG tablet TAKE 1 TABLET BY MOUTH EVERY NIGHT AT BEDTIME 30 tablet 0  . Lancets (ONETOUCH ULTRASOFT) lancets Use as instructed 100 each 12  . lidocaine (LIDODERM) 5 % Place 2 patches onto the skin at bedtime as needed (hip pain).     Marland Kitchen losartan (COZAAR) 100 MG tablet TAKE 1 TABLET BY MOUTH DAILY 30 tablet 5  . metFORMIN (GLUCOPHAGE-XR) 500 MG 24 hr tablet TAKE 4 TABLETS BY MOUTH DAILY WITH BREAKFAST 120 tablet 0  . mirabegron ER (MYRBETRIQ) 50 MG TB24 Take 50 mg by mouth at bedtime.     Marland Kitchen nystatin cream (MYCOSTATIN) Apply 1 application topically 2 (two) times daily. 30 g 2  . Omega-3 Fatty Acids (FISH OIL) 1000 MG CAPS Take 1,000 mg by mouth daily.    . Oxycodone HCl 10 MG TABS Take 10 mg by mouth every 8 (eight) hours as needed.  0  . polycarbophil (FIBERCON) 625 MG tablet Take 1 tablet (625 mg total) by mouth daily. [place]  tablet 0  . potassium gluconate 595 MG TABS tablet Take 595 mg by mouth daily as needed (leg cramps).    . promethazine (PHENERGAN) 25 MG tablet Take 25 mg by mouth daily as needed for nausea or vomiting.     Marland Kitchen rOPINIRole (REQUIP) 4 MG tablet TAKE 1 TABLET(4 MG) BY MOUTH AT BEDTIME 30 tablet 0  . tiZANidine (ZANAFLEX) 4 MG tablet Take 1 tablet (4 mg total) by mouth at bedtime. 30 tablet 0  . topiramate (TOPAMAX) 50 MG tablet Take 50 mg by mouth 2 (two) times daily as needed (headaches).     . traZODone (DESYREL) 100 MG tablet Take 100 mg by mouth at bedtime. Take 1 - 2 tablets at bedtime as needed.    Marland Kitchen UNABLE TO FIND C-Pap machine , use nightly    . valACYclovir (VALTREX) 1000 MG tablet Take 1 tablet (1,000 mg total) by mouth 3 (three) times daily. 30 tablet 0  . [DISCONTINUED] pravastatin (PRAVACHOL) 20 MG tablet Take 1 tablet (20 mg total) by mouth daily. 30  tablet 3   No current facility-administered medications on file prior to visit.     Past Surgical History:  Procedure Laterality Date  . ABDOMINAL EXPLORATION SURGERY  1983   diverticulum removed  . APPENDECTOMY  1983   with exploration  . bladder hydrodistention  12/24/14  . BREAST CYST EXCISION  1978   left  . CERVICAL FUSION  2010  . LAPAROSCOPIC INCISIONAL / UMBILICAL / [place], 2001, 2007   x 3  . LAPAROTOMY  1982   cyst removed from right ovary and removal of adhesions  . LAPAROTOMY  1991  . NASAL SINUS SURGERY  2004  . RECTOCELE REPAIR  2009  . [place]   Removal of tumor  . TONSILLECTOMY AND ADENOIDECTOMY    . TOTAL KNEE ARTHROPLASTY     bilateral  . VAGINAL HYSTERECTOMY  1992    Allergies  Allergen Reactions  . Atorvastatin     Body aches, worsened interstitial cystitis.  . Buprenorphine Hcl Nausea And Vomiting  . Chlorzoxazone Other (See Comments) and Nausea Only    Unknown reaction  . Gabapentin Nausea And Vomiting  . Gemfibrozil Nausea And Vomiting  . Metronidazole Nausea And Vomiting  . Morphine And Related Nausea And Vomiting  . Nitrofurantoin Nausea And Vomiting  . Olmesartan Medoxomil-Hctz Nausea And Vomiting  . Ondansetron Nausea And Vomiting  . Pentosan Polysulfate Sodium Other (See Comments)    Unknown reaction  . Pravastatin Other (See Comments)    Body aches, worsening interstitial cystitis.  Marland Kitchen Propoxyphene N-Acetaminophen Nausea And Vomiting  . Ramipril Cough  . Sulfonamide Derivatives Nausea And Vomiting    Social History   Social History  . Marital status: Divorced    Spouse name: N/A  . Number of children: 2  . Years of education: N/A   Occupational History  . disabled    Social History Main Topics  . Smoking status: Former Smoker    Packs/day: 0.10    Years: 1.00    Types: Cigarettes    Quit date: 10/19/2012  . Smokeless tobacco: Never Used     Comment: using electronic cigarettes  .  Alcohol use Yes     Comment: 2 drinks per month  . Drug use: No  . Sexual activity: Not on file   Other Topics Concern  . Not on file   Social History Narrative   Disabled   Divorced,  lives alone - 1 dtr, 1 son    Family History  Problem Relation Age of Onset  . Breast cancer Mother   . Dementia Mother   . Breast cancer Maternal Grandmother   . Throat cancer Maternal Grandmother   . Esophageal cancer Maternal Grandmother   . Breast cancer Other     maternal great aunts x 5  . Rheum arthritis Other   . Colon cancer Neg Hx   . Diabetes Father   . Dementia Father   . Alzheimer's disease Father   . Diabetes Maternal Grandfather     BP 133/83   Pulse 76   Ht 5\' 6"  (1.676 m)   Wt 247 lb (112 kg)   BMI 39.87 kg/m   Review of Systems: See HPI above.    Objective:  Physical Exam:  Gen: NAD, comfortable in exam room  Right shoulder: No swelling, ecchymoses.  No gross deformity. No TTP. FROM with painful arc. Positive Hawkins, Neers. Negative Speeds, Yergasons. Strength 5/5 with empty can and resisted internal/external rotation.  Pain empty can, less IR. Negative apprehension. NV intact distally.  Left shoulder: FROM without pain.     Assessment & Plan:  1. Right shoulder pain - 2/2 rotator cuff tendinitis, impingement.  Shown home exercises to do daily.  Subacromial injection given as well.  Aleve or ibuprofen if needed.  Consider physical therapy, MRI/ultrasound, nitro patches if not improving at follow up in 1 month.  After informed written consent, patient was seated on exam table. Right shoulder was prepped with alcohol swab and utilizing posterior approach, patient's right subacromial space was injected with 3:1 marcaine: depomedrol. Patient tolerated the procedure well without immediate complications.

## 2016-09-09 ENCOUNTER — Other Ambulatory Visit: Payer: Self-pay | Admitting: Family

## 2016-09-10 ENCOUNTER — Other Ambulatory Visit: Payer: Self-pay | Admitting: Family

## 2016-09-10 NOTE — Telephone Encounter (Signed)
Call from [facility] that pt is needing refill. Pt is out esomeprazole (NEXIUM) 40 MG capsule  [facility] [place], [place] - [address]

## 2016-09-26 ENCOUNTER — Other Ambulatory Visit: Payer: Self-pay | Admitting: Family

## 2016-10-04 ENCOUNTER — Ambulatory Visit: Payer: Medicare Other | Admitting: Family Medicine

## 2016-10-06 ENCOUNTER — Other Ambulatory Visit: Payer: Self-pay | Admitting: Family Medicine

## 2016-10-07 NOTE — Telephone Encounter (Signed)
[name] patient

## 2016-10-08 NOTE — Telephone Encounter (Signed)
Refill sent per [facility] refill protocol/SLS  

## 2016-10-16 ENCOUNTER — Encounter: Payer: Self-pay | Admitting: Family Medicine

## 2016-10-16 ENCOUNTER — Ambulatory Visit (INDEPENDENT_AMBULATORY_CARE_PROVIDER_SITE_OTHER): Payer: Medicare Other | Admitting: Family Medicine

## 2016-10-16 DIAGNOSIS — M25511 Pain in right shoulder: Secondary | ICD-10-CM | POA: Diagnosis not present

## 2016-10-16 NOTE — Patient Instructions (Signed)
We will go ahead with an MRI of your shoulder to assess for a rotator cuff tear. I will call you the business day following the MRI to go over results and next steps.

## 2016-10-17 NOTE — Progress Notes (Addendum)
PCP and consultation requested by: [name], NP  Subjective:   HPI: Patient is a 60 y.o. female here for right shoulder pain.  10/17: Patient reports she's had 2 1/2 months of lateral right shoulder pain. Pain level 9/10, sharp. Worse with picking up items and reaching behind her. Tried aleve, uses fentanyl patch and oxycodone for chronic pain. No known injury or trauma. No skin changes, numbness though this does burn locally.  11/28: Patient reports she's only a little better following last visit. Pain about 10% improved - at 8/10 level, sharp. Unable to lay on back without shoulder pain. Worse still lifting things, trying to reach behind. No skin changes, numbness.  Past Medical History:  Diagnosis Date  . Anxiety   . Arthritis    in knees  . Chronic abdominal pain   . Chronic fatigue syndrome   . Depression   . Diabetes mellitus    diet controlled  . Diverticulosis   . Dyslipidemia   . Esophageal reflux   . Fibromyalgia   . Hypertension   . IBS (irritable bowel syndrome)   . IC (interstitial cystitis)   . Obesity   . Osteopenia   . Restless leg syndrome   . Sleep apnea   . TIA (transient ischemic attack) 08/22/2012    Current Outpatient Prescriptions on File Prior to Visit  Medication Sig Dispense Refill  . ALPRAZolam (XANAX) 1 MG tablet Take 1 tablet (1 mg total) by mouth 3 (three) times daily as needed for anxiety. 30 tablet 0  . amLODipine (NORVASC) 10 MG tablet TAKE 1 TABLET BY MOUTH EVERY NIGHT AT BEDTIME 30 tablet 5  . aspirin EC 325 MG tablet Take 325 mg by mouth daily.    Marland Kitchen bismuth subsalicylate (PEPTO-BISMOL) 262 MG/15ML suspension Take 30 mLs by mouth every 6 (six) hours as needed. 360 mL 0  . Brexpiprazole 2 MG TABS Take 1 tablet by mouth daily.    Marland Kitchen desvenlafaxine (PRISTIQ) 100 MG 24 hr tablet TK 1 T PO D  0  . Diclofenac Sodium (PENNSAID) 2 % SOLN Place 1 application onto the skin at bedtime as needed (knee pain).    Marland Kitchen dicyclomine  (BENTYL) 20 MG tablet TAKE 1 TABLET(20 MG) BY MOUTH EVERY 6 HOURS 30 tablet 0  . esomeprazole (NEXIUM) 40 MG capsule TAKE 1 CAPSULE BY MOUTH EVERY DAY AT NOON 30 capsule 0  . fentaNYL (DURAGESIC) 100 MCG/HR Place 1 patch (100 mcg total) onto the skin every other day. 5 patch 0  . ferrous sulfate 325 (65 FE) MG tablet Take 325 mg by mouth daily with breakfast.    . furosemide (LASIX) 20 MG tablet TAKE 1 TABLET BY MOUTH DAILY 30 tablet 5  . glucose blood (ONE TOUCH ULTRA TEST) test strip Use as instructed. Dx: 250.00 100 each 6  . INVOKANA 100 MG TABS tablet TAKE 1 TABLET BY MOUTH EVERY MORNING 30 tablet 2  . JANUVIA 100 MG tablet TAKE 1 TABLET BY MOUTH EVERY NIGHT AT BEDTIME 30 tablet 0  . Lancets (ONETOUCH ULTRASOFT) lancets Use as instructed 100 each 12  . lidocaine (LIDODERM) 5 % Place 2 patches onto the skin at bedtime as needed (hip pain).     Marland Kitchen losartan (COZAAR) 100 MG tablet TAKE 1 TABLET BY MOUTH DAILY 30 tablet 5  . metFORMIN (GLUCOPHAGE-XR) 500 MG 24 hr tablet TAKE 4 TABLETS BY MOUTH DAILY WITH BREAKFAST 120 tablet 0  . mirabegron ER (MYRBETRIQ) 50 MG TB24 Take 50 mg by mouth  at bedtime.     Marland Kitchen nystatin cream (MYCOSTATIN) Apply 1 application topically 2 (two) times daily. 30 g 2  . Omega-3 Fatty Acids (FISH OIL) 1000 MG CAPS Take 1,000 mg by mouth daily.    . Oxycodone HCl 10 MG TABS Take 10 mg by mouth every 8 (eight) hours as needed.  0  . polycarbophil (FIBERCON) 625 MG tablet Take 1 tablet (625 mg total) by mouth daily. 30 tablet 0  . potassium gluconate 595 MG TABS tablet Take 595 mg by mouth daily as needed (leg cramps).    . promethazine (PHENERGAN) 25 MG tablet Take 25 mg by mouth daily as needed for nausea or vomiting.     Marland Kitchen rOPINIRole (REQUIP) 4 MG tablet TAKE 1 TABLET(4 MG) BY MOUTH AT BEDTIME 30 tablet 0  . tiZANidine (ZANAFLEX) 4 MG tablet Take 1 tablet (4 mg total) by mouth at bedtime. 30 tablet 0  . topiramate (TOPAMAX) 50 MG tablet Take 50 mg by mouth 2 (two) times daily  as needed (headaches).     . traZODone (DESYREL) 100 MG tablet Take 100 mg by mouth at bedtime. Take 1 - 2 tablets at bedtime as needed.    Marland Kitchen UNABLE TO FIND C-Pap machine , use nightly    . valACYclovir (VALTREX) 1000 MG tablet Take 1 tablet (1,000 mg total) by mouth 3 (three) times daily. 30 tablet 0  . [DISCONTINUED] pravastatin (PRAVACHOL) 20 MG tablet Take 1 tablet (20 mg total) by mouth daily. 30 tablet 3   No current facility-administered medications on file prior to visit.     Past Surgical History:  Procedure Laterality Date  . ABDOMINAL EXPLORATION SURGERY  1983   diverticulum removed  . APPENDECTOMY  1983   with exploration  . bladder hydrodistention  12/24/14  . BREAST CYST EXCISION  1978   left  . CERVICAL FUSION  2010  . LAPAROSCOPIC INCISIONAL / UMBILICAL / [place], 2001, 2007   x 3  . LAPAROTOMY  1982   cyst removed from right ovary and removal of adhesions  . LAPAROTOMY  1991  . NASAL SINUS SURGERY  2004  . RECTOCELE REPAIR  2009  . [place]   Removal of tumor  . TONSILLECTOMY AND ADENOIDECTOMY    . TOTAL KNEE ARTHROPLASTY     bilateral  . VAGINAL HYSTERECTOMY  1992    Allergies  Allergen Reactions  . Atorvastatin     Body aches, worsened interstitial cystitis.  . Buprenorphine Hcl Nausea And Vomiting  . Chlorzoxazone Other (See Comments) and Nausea Only    Unknown reaction  . Gabapentin Nausea And Vomiting  . Gemfibrozil Nausea And Vomiting  . Metronidazole Nausea And Vomiting  . Morphine And Related Nausea And Vomiting  . Nitrofurantoin Nausea And Vomiting  . Olmesartan Medoxomil-Hctz Nausea And Vomiting  . Ondansetron Nausea And Vomiting  . Pentosan Polysulfate Sodium Other (See Comments)    Unknown reaction  . Pravastatin Other (See Comments)    Body aches, worsening interstitial cystitis.  Marland Kitchen Propoxyphene N-Acetaminophen Nausea And Vomiting  . Ramipril Cough  . Sulfonamide Derivatives Nausea And Vomiting     Social History   Social History  . Marital status: Divorced    Spouse name: N/A  . Number of children: 2  . Years of education: N/A   Occupational History  . disabled    Social History Main Topics  . Smoking status: Former Smoker    Packs/day: 0.10  Years: 1.00    Types: Cigarettes    Quit date: 10/19/2012  . Smokeless tobacco: Never Used     Comment: using electronic cigarettes  . Alcohol use Yes     Comment: 2 drinks per month  . Drug use: No  . Sexual activity: Not on file   Other Topics Concern  . Not on file   Social History Narrative   Disabled   Divorced, lives alone - 1 dtr, 1 son    Family History  Problem Relation Age of Onset  . Breast cancer Mother   . Dementia Mother   . Breast cancer Maternal Grandmother   . Throat cancer Maternal Grandmother   . Esophageal cancer Maternal Grandmother   . Breast cancer Other     maternal great aunts x 5  . Rheum arthritis Other   . Colon cancer Neg Hx   . Diabetes Father   . Dementia Father   . Alzheimer's disease Father   . Diabetes Maternal Grandfather     BP 132/84   Pulse 74   Ht 5\' 5"  (1.651 m)   Wt 247 lb (112 kg)   BMI 41.10 kg/m   Review of Systems: See HPI above.    Objective:  Physical Exam:  Gen: NAD, comfortable in exam room  Right shoulder: No swelling, ecchymoses.  No gross deformity. Mild tenderness anterior shoulder. 90 degrees abduction, 100 flexion, 60 ER with 80 on the left. Positive Hawkins, Neers. Negative Yergasons. Strength 5-/5 with empty can and 5/5 resisted internal/external rotation.  Pain all three motions. Negative apprehension. NV intact distally.  Left shoulder: FROM without pain.     Assessment & Plan:  1. Right shoulder pain - Not improving as expected following subacromial injection and with home exercises.  We discussed options and will go ahead with MRI of the shoulder to further assess for possible rotator cuff tear.  I suspect she is developing  frozen shoulder as well though pain is some improved.  Aleve or ibuprofen if needed.    Addendum:  MRI reviewed and discussed with patient.  She does have evidence of adhesive capsulitis which fits with her exam.  She clinically feels a little better since we last talked but shoulder still stiff.  Has a partial rotator cuff tear but no full thickness tears - should do well with conservative treatment.  She declined PT and intraarticular injection for now - will continue with HEP, reevaluate in 1 month.

## 2016-10-17 NOTE — Assessment & Plan Note (Signed)
Not improving as expected following subacromial injection and with home exercises.  We discussed options and will go ahead with MRI of the shoulder to further assess for possible rotator cuff tear.  I suspect she is developing frozen shoulder as well though pain is some improved.  Aleve or ibuprofen if needed.

## 2016-10-18 NOTE — Addendum Note (Signed)
Addended by: Sherrie George F on: 10/18/2016 03:35 PM   Modules accepted: Orders

## 2016-10-27 ENCOUNTER — Ambulatory Visit (HOSPITAL_BASED_OUTPATIENT_CLINIC_OR_DEPARTMENT_OTHER): Payer: Medicare Other

## 2016-11-03 ENCOUNTER — Ambulatory Visit (HOSPITAL_BASED_OUTPATIENT_CLINIC_OR_DEPARTMENT_OTHER)
Admission: RE | Admit: 2016-11-03 | Discharge: 2016-11-03 | Disposition: A | Discharge status: Home / Self Care | Payer: Medicare Other | Source: Ambulatory Visit | Attending: Family | Admitting: Family

## 2016-11-03 ENCOUNTER — Ambulatory Visit (HOSPITAL_BASED_OUTPATIENT_CLINIC_OR_DEPARTMENT_OTHER)
Admission: RE | Admit: 2016-11-03 | Discharge: 2016-11-03 | Disposition: A | Discharge status: Home / Self Care | Payer: Medicare Other | Source: Ambulatory Visit | Attending: Family Medicine | Admitting: Family Medicine

## 2016-11-03 DIAGNOSIS — M7551 Bursitis of right shoulder: Secondary | ICD-10-CM | POA: Insufficient documentation

## 2016-11-03 DIAGNOSIS — M25811 Other specified joint disorders, right shoulder: Secondary | ICD-10-CM | POA: Insufficient documentation

## 2016-11-03 DIAGNOSIS — Z1231 Encounter for screening mammogram for malignant neoplasm of breast: Secondary | ICD-10-CM | POA: Insufficient documentation

## 2016-11-03 DIAGNOSIS — M75111 Incomplete rotator cuff tear or rupture of right shoulder, not specified as traumatic: Secondary | ICD-10-CM | POA: Diagnosis not present

## 2016-11-03 DIAGNOSIS — Z0000 Encounter for general adult medical examination without abnormal findings: Secondary | ICD-10-CM | POA: Insufficient documentation

## 2016-11-03 DIAGNOSIS — M25511 Pain in right shoulder: Secondary | ICD-10-CM | POA: Diagnosis not present

## 2016-11-05 ENCOUNTER — Other Ambulatory Visit: Payer: Self-pay | Admitting: Family

## 2016-11-23 ENCOUNTER — Encounter: Payer: Self-pay | Admitting: Family

## 2016-11-23 ENCOUNTER — Ambulatory Visit (INDEPENDENT_AMBULATORY_CARE_PROVIDER_SITE_OTHER): Payer: Medicare Other | Admitting: Family

## 2016-11-23 VITALS — BP 152/80 | HR 82 | Temp 98.3°F | Resp 16 | Ht 65.0 in | Wt 255.4 lb

## 2016-11-23 DIAGNOSIS — E785 Hyperlipidemia, unspecified: Secondary | ICD-10-CM

## 2016-11-23 DIAGNOSIS — R0789 Other chest pain: Secondary | ICD-10-CM | POA: Diagnosis not present

## 2016-11-23 DIAGNOSIS — J069 Acute upper respiratory infection, unspecified: Secondary | ICD-10-CM

## 2016-11-23 DIAGNOSIS — R739 Hyperglycemia, unspecified: Secondary | ICD-10-CM

## 2016-11-23 DIAGNOSIS — F411 Generalized anxiety disorder: Secondary | ICD-10-CM

## 2016-11-23 DIAGNOSIS — I10 Essential (primary) hypertension: Secondary | ICD-10-CM | POA: Diagnosis not present

## 2016-11-23 DIAGNOSIS — B9789 Other viral agents as the cause of diseases classified elsewhere: Secondary | ICD-10-CM

## 2016-11-23 DIAGNOSIS — E119 Type 2 diabetes mellitus without complications: Secondary | ICD-10-CM

## 2016-11-23 LAB — LIPID PANEL
CHOL/HDL RATIO: 3
Cholesterol: 214 mg/dL — ABNORMAL HIGH (ref 0–200)
HDL: 61.7 mg/dL (ref >39.00)
LDL Cholesterol: 131 mg/dL — ABNORMAL HIGH (ref 0–99)
NONHDL: 152.51
Triglycerides: 108 mg/dL (ref 0.0–149.0)
VLDL: 21.6 mg/dL (ref 0.0–40.0)

## 2016-11-23 LAB — BASIC METABOLIC PANEL
BUN: 14 mg/dL (ref 6–23)
CHLORIDE: 100 meq/L (ref 96–112)
CO2: 30 meq/L (ref 19–32)
CREATININE: 0.73 mg/dL (ref 0.40–1.20)
Calcium: 9.5 mg/dL (ref 8.4–10.5)
GFR: 86.62 mL/min (ref >60.00)
Glucose, Bld: 136 mg/dL — ABNORMAL HIGH (ref 70–99)
Potassium: 4.4 mEq/L (ref 3.5–5.1)
Sodium: 135 mEq/L (ref 135–145)

## 2016-11-23 LAB — HEMOGLOBIN A1C: HEMOGLOBIN A1C: 7.4 % — AB (ref 4.6–6.5)

## 2016-11-23 NOTE — Assessment & Plan Note (Signed)
Uncontrolled. Advised pt to establish with a psychiatrist [facility] since she is not happy with her current psychiatrist.

## 2016-11-23 NOTE — Assessment & Plan Note (Signed)
Obtain follow up lipid panel.

## 2016-11-23 NOTE — Progress Notes (Signed)
Subjective:    Patient ID: [name], female    DOB: Aug 13, 1956, 61 y.o.   MRN: [id number]  HPI  [name] is a 61 yr old female who presents today for follow up.  1) HTN- on losartan and amlodipine.   BP Readings from Last 3 Encounters:  11/23/16 (!) 151/70  10/16/16 132/84  09/04/16 133/83   2) DM2- on invokana, januvia, metformin. Reports that she has not been checking her sugars regularly.  Lab Results  Component Value Date   HGBA1C 7.3 (H) 08/14/2016   HGBA1C 9.4 (H) 03/13/2016   HGBA1C 7.0 (H) 04/26/2015   Lab Results  Component Value Date   MICROALBUR 0.7 03/13/2016   LDLCALC 118 (H) 08/14/2016   CREATININE 0.92 08/14/2016   3) Depression/anxiety- reports increased anxiety today. Thinks that it is related to some family stress and having a lot to do today.Reports she had some chest pressure when I first walked in but that has not resolved.  Notes increased anxiety at the time of the chest pressure.  4) Sore throat- some post nasal drip yesterday as well as scratchy throat.   She will be scheduling botox for her bladder and IC.    She continues to follow with Sherilyn Cooter PA-C for her chronic pain. [facility] is also followed [facility] to [facility] a [facility]. She sees a psychiatrist at [facility].   Review of Systems    see HPI  Past Medical History:  Diagnosis Date  . Anxiety   . Arthritis    in knees  . Chronic abdominal pain   . Chronic fatigue syndrome   . Depression   . Diabetes mellitus    diet controlled  . Diverticulosis   . Dyslipidemia   . Esophageal reflux   . Fibromyalgia   . Hypertension   . IBS (irritable bowel syndrome)   . IC (interstitial cystitis)   . Obesity   . Osteopenia   . Restless leg syndrome   . Sleep apnea   . TIA (transient ischemic attack) 08/22/2012     Social History   Social History  . Marital status: Divorced    Spouse name: N/A  . Number of children: 2  . Years of education: N/A    Occupational History  . disabled    Social History Main Topics  . Smoking status: Former Smoker    Packs/day: 0.10    Years: 1.00    Types: Cigarettes    Quit date: 10/19/2012  . Smokeless tobacco: Never Used     Comment: using electronic cigarettes  . Alcohol use Yes     Comment: 2 drinks per month  . Drug use: No  . Sexual activity: Not on file   Other Topics Concern  . Not on file   Social History Narrative   Disabled   Divorced, lives alone - 1 dtr, 1 son    Past Surgical History:  Procedure Laterality Date  . ABDOMINAL EXPLORATION SURGERY  1983   diverticulum removed  . APPENDECTOMY  1983   with exploration  . bladder hydrodistention  12/24/14  . BREAST CYST EXCISION  1978   left  . CERVICAL FUSION  2010  . LAPAROSCOPIC INCISIONAL / UMBILICAL / [place], 2001, 2007   x 3  . LAPAROTOMY  1982   cyst removed from right ovary and removal of adhesions  . LAPAROTOMY  1991  . NASAL SINUS SURGERY  2004  . RECTOCELE  REPAIR  2009  . [place]   Removal of tumor  . TONSILLECTOMY AND ADENOIDECTOMY    . TOTAL KNEE ARTHROPLASTY     bilateral  . VAGINAL HYSTERECTOMY  1992    Family History  Problem Relation Age of Onset  . Breast cancer Mother   . Dementia Mother   . Breast cancer Maternal Grandmother   . Throat cancer Maternal Grandmother   . Esophageal cancer Maternal Grandmother   . Breast cancer Other     maternal great aunts x 5  . Rheum arthritis Other   . Colon cancer Neg Hx   . Diabetes Father   . Dementia Father   . Alzheimer's disease Father   . Diabetes Maternal Grandfather     Allergies  Allergen Reactions  . Atorvastatin     Body aches, worsened interstitial cystitis.  . Buprenorphine Hcl Nausea And Vomiting  . Chlorzoxazone Other (See Comments) and Nausea Only    Unknown reaction  . Gabapentin Nausea And Vomiting  . Gemfibrozil Nausea And Vomiting  . Metronidazole Nausea And Vomiting  . Morphine And  Related Nausea And Vomiting  . Nitrofurantoin Nausea And Vomiting  . Olmesartan Medoxomil-Hctz Nausea And Vomiting  . Ondansetron Nausea And Vomiting  . Pentosan Polysulfate Sodium Other (See Comments)    Unknown reaction  . Pravastatin Other (See Comments)    Body aches, worsening interstitial cystitis.  Marland Kitchen Propoxyphene N-Acetaminophen Nausea And Vomiting  . Ramipril Cough  . Sulfonamide Derivatives Nausea And Vomiting    Current Outpatient Prescriptions on File Prior to Visit  Medication Sig Dispense Refill  . ALPRAZolam (XANAX) 1 MG tablet Take 1 tablet (1 mg total) by mouth 3 (three) times daily as needed for anxiety. 30 tablet 0  . amLODipine (NORVASC) 10 MG tablet TAKE 1 TABLET BY MOUTH EVERY NIGHT AT BEDTIME 30 tablet 5  . aspirin EC 325 MG tablet Take 325 mg by mouth daily.    Marland Kitchen bismuth subsalicylate (PEPTO-BISMOL) 262 MG/15ML suspension Take 30 mLs by mouth every 6 (six) hours as needed. 360 mL 0  . Brexpiprazole 2 MG TABS Take 1 tablet by mouth daily.    Marland Kitchen desvenlafaxine (PRISTIQ) 100 MG 24 hr tablet TK 1 T PO D  0  . Diclofenac Sodium (PENNSAID) 2 % SOLN Place 1 application onto the skin at bedtime as needed (knee pain).    Marland Kitchen dicyclomine (BENTYL) 20 MG tablet TAKE 1 TABLET(20 MG) BY MOUTH EVERY 6 HOURS 30 tablet 0  . esomeprazole (NEXIUM) 40 MG capsule TAKE 1 CAPSULE BY MOUTH EVERY DAY AT NOON 30 capsule 5  . fentaNYL (DURAGESIC) 100 MCG/HR Place 1 patch (100 mcg total) onto the skin every other day. 5 patch 0  . ferrous sulfate 325 (65 FE) MG tablet Take 325 mg by mouth daily with breakfast.    . furosemide (LASIX) 20 MG tablet TAKE 1 TABLET BY MOUTH DAILY 30 tablet 5  . glucose blood (ONE TOUCH ULTRA TEST) test strip Use as instructed. Dx: 250.00 100 each 6  . INVOKANA 100 MG TABS tablet TAKE 1 TABLET BY MOUTH EVERY MORNING 30 tablet 2  . JANUVIA 100 MG tablet TAKE 1 TABLET BY MOUTH EVERY NIGHT AT BEDTIME 30 tablet 5  . Lancets (ONETOUCH ULTRASOFT) lancets Use as instructed  100 each 12  . lidocaine (LIDODERM) 5 % Place 2 patches onto the skin at bedtime as needed (hip pain).     Marland Kitchen losartan (COZAAR) 100 MG tablet TAKE  1 TABLET BY MOUTH DAILY 30 tablet 5  . metFORMIN (GLUCOPHAGE-XR) 500 MG 24 hr tablet TAKE 4 TABLETS BY MOUTH DAILY WITH BREAKFAST 120 tablet 5  . mirabegron ER (MYRBETRIQ) 50 MG TB24 Take 50 mg by mouth at bedtime.     Marland Kitchen nystatin cream (MYCOSTATIN) Apply 1 application topically 2 (two) times daily. 30 g 2  . Omega-3 Fatty Acids (FISH OIL) 1000 MG CAPS Take 1,000 mg by mouth daily.    . Oxycodone HCl 10 MG TABS Take 10 mg by mouth every 8 (eight) hours as needed.  0  . polycarbophil (FIBERCON) 625 MG tablet Take 1 tablet (625 mg total) by mouth daily. 30 tablet 0  . potassium gluconate 595 MG TABS tablet Take 595 mg by mouth daily as needed (leg cramps).    . promethazine (PHENERGAN) 25 MG tablet Take 25 mg by mouth daily as needed for nausea or vomiting.     Marland Kitchen rOPINIRole (REQUIP) 4 MG tablet TAKE 1 TABLET(4 MG) BY MOUTH AT BEDTIME 30 tablet 5  . tiZANidine (ZANAFLEX) 4 MG tablet Take 1 tablet (4 mg total) by mouth at bedtime. 30 tablet 0  . topiramate (TOPAMAX) 50 MG tablet Take 50 mg by mouth 2 (two) times daily as needed (headaches).     . traZODone (DESYREL) 100 MG tablet Take 100 mg by mouth at bedtime. Take 1 - 2 tablets at bedtime as needed.    Marland Kitchen UNABLE TO FIND C-Pap machine , use nightly    . valACYclovir (VALTREX) 1000 MG tablet Take 1 tablet (1,000 mg total) by mouth 3 (three) times daily. 30 tablet 0  . [DISCONTINUED] pravastatin (PRAVACHOL) 20 MG tablet Take 1 tablet (20 mg total) by mouth daily. 30 tablet 3   No current facility-administered medications on file prior to visit.     BP (!) 151/70 (BP Location: Left Arm, Cuff Size: Large)   Pulse 82   Temp 98.3 F (36.8 C) (Oral)   Resp 16   Ht 5\' 5"  (1.651 m)   Wt 255 lb 6.4 oz (115.8 kg)   SpO2 98% Comment: room air  BMI 42.50 kg/m    Objective:   Physical Exam    Constitutional: She appears well-developed and well-nourished.  HENT:  Head: Normocephalic and atraumatic.  Right Ear: Tympanic membrane and ear canal normal.  Left Ear: Tympanic membrane and ear canal normal.  Mouth/Throat: No oropharyngeal exudate, posterior oropharyngeal edema or posterior oropharyngeal erythema.  Cardiovascular: Normal rate, regular rhythm and normal heart sounds.   No murmur heard. Pulmonary/Chest: Effort normal and breath sounds normal. No respiratory distress. She has no wheezes.  Psychiatric: She has a normal mood and affect. Her behavior is normal. Judgment and thought content normal.          Assessment & Plan:  Viral URI- advised pt to call if new/worsening symptoms or symptoms are not improved in a few days.   Atypical CP- EKG tracing is personally reviewed.  EKG notes NSR.  No acute changes.  Doubt cardiac etiology, seems to be consistent with anxiety. Monitor.

## 2016-11-23 NOTE — Assessment & Plan Note (Signed)
Recent control uncertain as pt is not checking her sugars. Obtain bmet, a1c, urine microalbumin.

## 2016-11-23 NOTE — Patient Instructions (Addendum)
Please complete lab work prior to leaving. Go to the ER if recurrent chest pressure occurs. Please schedule an appointment with a psychiatrist at the [facility].

## 2016-11-23 NOTE — Progress Notes (Signed)
Pre visit review using our clinic review tool, if applicable. No additional management support is needed unless otherwise documented below in the visit note. 

## 2016-11-24 ENCOUNTER — Telehealth: Payer: Self-pay | Admitting: Family

## 2016-11-24 DIAGNOSIS — IMO0001 Reserved for inherently not codable concepts without codable children: Secondary | ICD-10-CM

## 2016-11-24 DIAGNOSIS — E1165 Type 2 diabetes mellitus with hyperglycemia: Principal | ICD-10-CM

## 2016-11-24 LAB — MICROALBUMIN / CREATININE URINE RATIO
Creatinine, Urine: 61 mg/dL (ref 20–320)
MICROALB UR: 0.2 mg/dL
Microalb Creat Ratio: 3 mcg/mg creat (ref <30)

## 2016-11-24 MED ORDER — CANAGLIFLOZIN 300 MG PO TABS: 300.0000 mg | ORAL_TABLET | Freq: Every day | ORAL | 5 refills | Status: DC

## 2016-11-24 NOTE — Telephone Encounter (Signed)
a1c above goal. Increase invokana from 100mg  to 300mg .  Cholesterol is high. Work on low fat/low cholesterol diet, exercise and weight loss.

## 2016-11-26 NOTE — Telephone Encounter (Signed)
Please use DM2 uncontrolled. Thanks.

## 2016-11-26 NOTE — Telephone Encounter (Signed)
Melissa-- do we use diabetes controlled as on problem list or is there another dx you want me to use?  Notified pt and she voices understanding.

## 2016-11-26 NOTE — Telephone Encounter (Signed)
Lab Results  Component Value Date   HGBA1C 7.4 (H) 11/23/2016   Lets refer her to endocrinology. Continue invokana 300mg .

## 2016-11-26 NOTE — Telephone Encounter (Signed)
Notified pt. She states she was already on 300mg  once a day. Per 08/15/16 phone note, invokana was increased form 100 to 300mg  at that time.  Please advise what you would like pt to do?

## 2016-12-24 ENCOUNTER — Ambulatory Visit (INDEPENDENT_AMBULATORY_CARE_PROVIDER_SITE_OTHER): Payer: Medicare Other | Admitting: Family

## 2016-12-24 ENCOUNTER — Encounter: Payer: Self-pay | Admitting: Family

## 2016-12-24 VITALS — BP 150/83 | HR 83 | Temp 98.6°F | Ht 65.0 in | Wt 255.8 lb

## 2016-12-24 DIAGNOSIS — I10 Essential (primary) hypertension: Secondary | ICD-10-CM | POA: Diagnosis not present

## 2016-12-24 MED ORDER — METOPROLOL SUCCINATE ER 50 MG PO TB24
50.0000 mg | ORAL_TABLET | Freq: Every day | ORAL | 3 refills | Status: DC
Start: 2016-12-24 — End: 2017-04-08

## 2016-12-24 NOTE — Progress Notes (Signed)
Subjective:    Patient ID: [name], female    DOB: May 04, 1956, 61 y.o.   MRN: [id number]  HPI  [name] is a 61 yr old female who presents today for follow up of hypertension.  She is maintained on losartan 100mg ,  Lasix 20mg , and amlodipine 10mg .     BP Readings from Last 3 Encounters:  12/24/16 (!) 150/83  11/23/16 (!) 152/80  10/16/16 132/84     Review of Systems  Respiratory: Negative for shortness of breath.   Cardiovascular: Negative for chest pain and leg swelling.   Past Medical History:  Diagnosis Date  . Anxiety   . Arthritis    in knees  . Chronic abdominal pain   . Chronic fatigue syndrome   . Depression   . Diabetes mellitus    diet controlled  . Diverticulosis   . Dyslipidemia   . Esophageal reflux   . Fibromyalgia   . Hypertension   . IBS (irritable bowel syndrome)   . IC (interstitial cystitis)   . Obesity   . Osteopenia   . Restless leg syndrome   . Sleep apnea   . TIA (transient ischemic attack) 08/22/2012     Social History   Social History  . Marital status: Divorced    Spouse name: N/A  . Number of children: 2  . Years of education: N/A   Occupational History  . disabled    Social History Main Topics  . Smoking status: Former Smoker    Packs/day: 0.10    Years: 1.00    Types: Cigarettes    Quit date: 10/19/2012  . Smokeless tobacco: Never Used     Comment: using electronic cigarettes  . Alcohol use Yes     Comment: 2 drinks per month  . Drug use: No  . Sexual activity: Not on file   Other Topics Concern  . Not on file   Social History Narrative   Disabled   Divorced, lives alone - 1 dtr, 1 son    Past Surgical History:  Procedure Laterality Date  . ABDOMINAL EXPLORATION SURGERY  1983   diverticulum removed  . APPENDECTOMY  1983   with exploration  . bladder hydrodistention  12/24/14  . BREAST CYST EXCISION  1978   left  . CERVICAL FUSION  2010  . LAPAROSCOPIC INCISIONAL / UMBILICAL / [place], 2001, 2007   x 3  . LAPAROTOMY  1982   cyst removed from right ovary and removal of adhesions  . LAPAROTOMY  1991  . NASAL SINUS SURGERY  2004  . RECTOCELE REPAIR  2009  . [place]   Removal of tumor  . TONSILLECTOMY AND ADENOIDECTOMY    . TOTAL KNEE ARTHROPLASTY     bilateral  . VAGINAL HYSTERECTOMY  1992    Family History  Problem Relation Age of Onset  . Breast cancer Mother   . Dementia Mother   . Breast cancer Maternal Grandmother   . Throat cancer Maternal Grandmother   . Esophageal cancer Maternal Grandmother   . Breast cancer Other     maternal great aunts x 5  . Rheum arthritis Other   . Colon cancer Neg Hx   . Diabetes Father   . Dementia Father   . Alzheimer's disease Father   . Diabetes Maternal Grandfather     Allergies  Allergen Reactions  . Atorvastatin     Body aches, worsened interstitial cystitis.  . Buprenorphine  Hcl Nausea And Vomiting  . Chlorzoxazone Other (See Comments) and Nausea Only    Unknown reaction  . Gabapentin Nausea And Vomiting  . Gemfibrozil Nausea And Vomiting  . Metronidazole Nausea And Vomiting  . Morphine And Related Nausea And Vomiting  . Nitrofurantoin Nausea And Vomiting  . Olmesartan Medoxomil-Hctz Nausea And Vomiting  . Ondansetron Nausea And Vomiting  . Pentosan Polysulfate Sodium Other (See Comments)    Unknown reaction  . Pravastatin Other (See Comments)    Body aches, worsening interstitial cystitis.  Marland Kitchen Propoxyphene N-Acetaminophen Nausea And Vomiting  . Ramipril Cough  . Sulfonamide Derivatives Nausea And Vomiting    Current Outpatient Prescriptions on File Prior to Visit  Medication Sig Dispense Refill  . ALPRAZolam (XANAX) 1 MG tablet Take 1 tablet (1 mg total) by mouth 3 (three) times daily as needed for anxiety. 30 tablet 0  . amLODipine (NORVASC) 10 MG tablet TAKE 1 TABLET BY MOUTH EVERY NIGHT AT BEDTIME 30 tablet 5  . aspirin EC 325 MG tablet Take 325 mg by mouth daily.      Marland Kitchen bismuth subsalicylate (PEPTO-BISMOL) 262 MG/15ML suspension Take 30 mLs by mouth every 6 (six) hours as needed. 360 mL 0  . Brexpiprazole 2 MG TABS Take 1 tablet by mouth daily.    . canagliflozin (INVOKANA) 300 MG TABS tablet Take 1 tablet (300 mg total) by mouth daily before breakfast. 30 tablet 5  . desvenlafaxine (PRISTIQ) 100 MG 24 hr tablet TK 1 T PO D  0  . Diclofenac Sodium (PENNSAID) 2 % SOLN Place 1 application onto the skin at bedtime as needed (knee pain).    Marland Kitchen dicyclomine (BENTYL) 20 MG tablet TAKE 1 TABLET(20 MG) BY MOUTH EVERY 6 HOURS 30 tablet 0  . esomeprazole (NEXIUM) 40 MG capsule TAKE 1 CAPSULE BY MOUTH EVERY DAY AT NOON 30 capsule 5  . fentaNYL (DURAGESIC) 100 MCG/HR Place 1 patch (100 mcg total) onto the skin every other day. 5 patch 0  . ferrous sulfate 325 (65 FE) MG tablet Take 325 mg by mouth daily with breakfast.    . furosemide (LASIX) 20 MG tablet TAKE 1 TABLET BY MOUTH DAILY 30 tablet 5  . glucose blood (ONE TOUCH ULTRA TEST) test strip Use as instructed. Dx: 250.00 100 each 6  . JANUVIA 100 MG tablet TAKE 1 TABLET BY MOUTH EVERY NIGHT AT BEDTIME 30 tablet 5  . Lancets (ONETOUCH ULTRASOFT) lancets Use as instructed 100 each 12  . lidocaine (LIDODERM) 5 % Place 2 patches onto the skin at bedtime as needed (hip pain).     Marland Kitchen losartan (COZAAR) 100 MG tablet TAKE 1 TABLET BY MOUTH DAILY 30 tablet 5  . metFORMIN (GLUCOPHAGE-XR) 500 MG 24 hr tablet TAKE 4 TABLETS BY MOUTH DAILY WITH BREAKFAST 120 tablet 5  . mirabegron ER (MYRBETRIQ) 50 MG TB24 Take 50 mg by mouth at bedtime.     Marland Kitchen nystatin cream (MYCOSTATIN) Apply 1 application topically 2 (two) times daily. 30 g 2  . Omega-3 Fatty Acids (FISH OIL) 1000 MG CAPS Take 1,000 mg by mouth daily.    . Oxycodone HCl 10 MG TABS Take 10 mg by mouth every 8 (eight) hours as needed.  0  . polycarbophil (FIBERCON) 625 MG tablet Take 1 tablet (625 mg total) by mouth daily. 30 tablet 0  . potassium gluconate 595 MG TABS tablet Take  595 mg by mouth daily as needed (leg cramps).    . promethazine (PHENERGAN) 25 MG tablet  Take 25 mg by mouth daily as needed for nausea or vomiting.     Marland Kitchen rOPINIRole (REQUIP) 4 MG tablet TAKE 1 TABLET(4 MG) BY MOUTH AT BEDTIME 30 tablet 5  . tiZANidine (ZANAFLEX) 4 MG tablet Take 1 tablet (4 mg total) by mouth at bedtime. 30 tablet 0  . topiramate (TOPAMAX) 50 MG tablet Take 50 mg by mouth 2 (two) times daily as needed (headaches).     . traZODone (DESYREL) 100 MG tablet Take 100 mg by mouth at bedtime. Take 1 - 2 tablets at bedtime as needed.    Marland Kitchen UNABLE TO FIND C-Pap machine , use nightly    . valACYclovir (VALTREX) 1000 MG tablet Take 1 tablet (1,000 mg total) by mouth 3 (three) times daily. 30 tablet 0  . [DISCONTINUED] pravastatin (PRAVACHOL) 20 MG tablet Take 1 tablet (20 mg total) by mouth daily. 30 tablet 3   No current facility-administered medications on file prior to visit.     BP (!) 150/83 (BP Location: Left Arm, Cuff Size: Large)   Temp 98.6 F (37 C) (Oral)   Ht 5\' 5"  (1.651 m)   Wt 255 lb 12.8 oz (116 kg)   SpO2 95%   BMI 42.57 kg/m       Objective:   Physical Exam  Constitutional: She is oriented to person, place, and time. She appears well-developed and well-nourished.  Cardiovascular: Normal rate, regular rhythm and normal heart sounds.   No murmur heard. Pulmonary/Chest: Effort normal and breath sounds normal. No respiratory distress. She has no wheezes.  Musculoskeletal: She exhibits no edema.  Neurological: She is alert and oriented to person, place, and time.  Psychiatric: She has a normal mood and affect. Her behavior is normal. Judgment and thought content normal.          Assessment & Plan:  HTN- uncontrolled. Will continue current meds and add metoprolol er 50mg  once daily. Follow up in 2 weeks for nurse visit BP recheck.

## 2016-12-24 NOTE — Progress Notes (Signed)
Pre visit review using our clinic review tool, if applicable. No additional management support is needed unless otherwise documented below in the visit note. 

## 2016-12-24 NOTE — Patient Instructions (Signed)
Please begin toprol ER once daily for your blood pressure.

## 2017-01-07 ENCOUNTER — Encounter: Payer: Self-pay | Admitting: Family Medicine

## 2017-01-07 ENCOUNTER — Ambulatory Visit (INDEPENDENT_AMBULATORY_CARE_PROVIDER_SITE_OTHER): Payer: Medicare Other | Admitting: Family Medicine

## 2017-01-07 DIAGNOSIS — M25511 Pain in right shoulder: Secondary | ICD-10-CM

## 2017-01-07 MED ORDER — METHYLPREDNISOLONE ACETATE 40 MG/ML IJ SUSP
40.0000 mg | Freq: Once | INTRAMUSCULAR | Status: AC
  Administered 2017-01-07: 40 mg via INTRA_ARTICULAR

## 2017-01-07 NOTE — Patient Instructions (Addendum)
You have a frozen shoulder (adhesive capsulitis), a buildup of scar tissue that limits motion of the shoulder joint. Limit lifting and overhead activities as much as possible. Heat 15 minutes at a time 3-4 times a day may help with movement and stiffness. Tylenol and/or aleve as needed for pain and inflammation. Steroid injections in a series have been shown to help with pain and motion - you were given one of these today. Codman exercises (pendulum, wall walking or table slides, arm circles) - do 3 sets of 10 once or twice a day. Physical therapy for rotator cuff strengthening is a consideration as well Follow up in 6 weeks otherwise.

## 2017-01-09 NOTE — Assessment & Plan Note (Signed)
2/2 adhesive capsulitis.  Clinically feels better but motion not much different.  She wanted to go ahead with intraarticular injection which was given today.  F/u in 6 weeks.  Continue home exercises, heat.  Tylenol and/or aleve as needed.  Continue fentanyl, oxycodone.  After informed written consent, patient was seated on exam table. Right shoulder was prepped with alcohol swab and utilizing posterior approach, patient's right glenohumeral space was injected with 3:1 bupivicaine: depomedrol. Patient tolerated the procedure well without immediate complications.

## 2017-01-09 NOTE — Progress Notes (Signed)
PCP and consultation requested by: [name], NP  Subjective:   HPI: Patient is a 61 y.o. female here for right shoulder pain.  10/17: Patient reports she's had 2 1/2 months of lateral right shoulder pain. Pain level 9/10, sharp. Worse with picking up items and reaching behind her. Tried aleve, uses fentanyl patch and oxycodone for chronic pain. No known injury or trauma. No skin changes, numbness though this does burn locally.  10/16/16: Patient reports she's only a little better following last visit. Pain about 10% improved - at 8/10 level, sharp. Unable to lay on back without shoulder pain. Worse still lifting things, trying to reach behind. No skin changes, numbness.  01/07/17: Patient reports she has improved some since last visit. However pain still 5/10, sharp throughout right shoulder. Cannot lie on the right side. Worse with reaching and overhead motion. Has been doing home exercises, using fentanyl patches and taking oxycodone. No skin changes, numbness.  Past Medical History:  Diagnosis Date  . Anxiety   . Arthritis    in knees  . Chronic abdominal pain   . Chronic fatigue syndrome   . Depression   . Diabetes mellitus    diet controlled  . Diverticulosis   . Dyslipidemia   . Esophageal reflux   . Fibromyalgia   . Hypertension   . IBS (irritable bowel syndrome)   . IC (interstitial cystitis)   . Obesity   . Osteopenia   . Restless leg syndrome   . Sleep apnea   . TIA (transient ischemic attack) 08/22/2012    Current Outpatient Prescriptions on File Prior to Visit  Medication Sig Dispense Refill  . ALPRAZolam (XANAX) 1 MG tablet Take 1 tablet (1 mg total) by mouth 3 (three) times daily as needed for anxiety. 30 tablet 0  . amLODipine (NORVASC) 10 MG tablet TAKE 1 TABLET BY MOUTH EVERY NIGHT AT BEDTIME 30 tablet 5  . aspirin EC 325 MG tablet Take 325 mg by mouth daily.    Marland Kitchen bismuth subsalicylate (PEPTO-BISMOL) 262 MG/15ML suspension Take 30  mLs by mouth every 6 (six) hours as needed. 360 mL 0  . Brexpiprazole 2 MG TABS Take 1 tablet by mouth daily.    . canagliflozin (INVOKANA) 300 MG TABS tablet Take 1 tablet (300 mg total) by mouth daily before breakfast. 30 tablet 5  . desvenlafaxine (PRISTIQ) 100 MG 24 hr tablet TK 1 T PO D  0  . Diclofenac Sodium (PENNSAID) 2 % SOLN Place 1 application onto the skin at bedtime as needed (knee pain).    Marland Kitchen dicyclomine (BENTYL) 20 MG tablet TAKE 1 TABLET(20 MG) BY MOUTH EVERY 6 HOURS 30 tablet 0  . esomeprazole (NEXIUM) 40 MG capsule TAKE 1 CAPSULE BY MOUTH EVERY DAY AT NOON 30 capsule 5  . fentaNYL (DURAGESIC) 100 MCG/HR Place 1 patch (100 mcg total) onto the skin every other day. 5 patch 0  . ferrous sulfate 325 (65 FE) MG tablet Take 325 mg by mouth daily with breakfast.    . furosemide (LASIX) 20 MG tablet TAKE 1 TABLET BY MOUTH DAILY 30 tablet 5  . glucose blood (ONE TOUCH ULTRA TEST) test strip Use as instructed. Dx: 250.00 100 each 6  . JANUVIA 100 MG tablet TAKE 1 TABLET BY MOUTH EVERY NIGHT AT BEDTIME 30 tablet 5  . Lancets (ONETOUCH ULTRASOFT) lancets Use as instructed 100 each 12  . lidocaine (LIDODERM) 5 % Place 2 patches onto the skin at bedtime as needed (hip pain).     Marland Kitchen  losartan (COZAAR) 100 MG tablet TAKE 1 TABLET BY MOUTH DAILY 30 tablet 5  . metFORMIN (GLUCOPHAGE-XR) 500 MG 24 hr tablet TAKE 4 TABLETS BY MOUTH DAILY WITH BREAKFAST 120 tablet 5  . metoprolol succinate (TOPROL-XL) 50 MG 24 hr tablet Take 1 tablet (50 mg total) by mouth daily. Take with or immediately following a meal. 30 tablet 3  . mirabegron ER (MYRBETRIQ) 50 MG TB24 Take 50 mg by mouth at bedtime.     Marland Kitchen nystatin cream (MYCOSTATIN) Apply 1 application topically 2 (two) times daily. 30 g 2  . Omega-3 Fatty Acids (FISH OIL) 1000 MG CAPS Take 1,000 mg by mouth daily.    . Oxycodone HCl 10 MG TABS Take 10 mg by mouth every 8 (eight) hours as needed.  0  . polycarbophil (FIBERCON) 625 MG tablet Take 1 tablet (625 mg  total) by mouth daily. 30 tablet 0  . potassium gluconate 595 MG TABS tablet Take 595 mg by mouth daily as needed (leg cramps).    . promethazine (PHENERGAN) 25 MG tablet Take 25 mg by mouth daily as needed for nausea or vomiting.     Marland Kitchen rOPINIRole (REQUIP) 4 MG tablet TAKE 1 TABLET(4 MG) BY MOUTH AT BEDTIME 30 tablet 5  . tiZANidine (ZANAFLEX) 4 MG tablet Take 1 tablet (4 mg total) by mouth at bedtime. 30 tablet 0  . topiramate (TOPAMAX) 50 MG tablet Take 50 mg by mouth 2 (two) times daily as needed (headaches).     . traZODone (DESYREL) 100 MG tablet Take 100 mg by mouth at bedtime. Take 1 - 2 tablets at bedtime as needed.    Marland Kitchen UNABLE TO FIND C-Pap machine , use nightly    . valACYclovir (VALTREX) 1000 MG tablet Take 1 tablet (1,000 mg total) by mouth 3 (three) times daily. 30 tablet 0  . [DISCONTINUED] pravastatin (PRAVACHOL) 20 MG tablet Take 1 tablet (20 mg total) by mouth daily. 30 tablet 3   No current facility-administered medications on file prior to visit.     Past Surgical History:  Procedure Laterality Date  . ABDOMINAL EXPLORATION SURGERY  1983   diverticulum removed  . APPENDECTOMY  1983   with exploration  . bladder hydrodistention  12/24/14  . BREAST CYST EXCISION  1978   left  . CERVICAL FUSION  2010  . LAPAROSCOPIC INCISIONAL / UMBILICAL / [place], 2001, 2007   x 3  . LAPAROTOMY  1982   cyst removed from right ovary and removal of adhesions  . LAPAROTOMY  1991  . NASAL SINUS SURGERY  2004  . RECTOCELE REPAIR  2009  . [place]   Removal of tumor  . TONSILLECTOMY AND ADENOIDECTOMY    . TOTAL KNEE ARTHROPLASTY     bilateral  . VAGINAL HYSTERECTOMY  1992    Allergies  Allergen Reactions  . Atorvastatin     Body aches, worsened interstitial cystitis.  . Buprenorphine Hcl Nausea And Vomiting  . Chlorzoxazone Other (See Comments) and Nausea Only    Unknown reaction  . Gabapentin Nausea And Vomiting  . Gemfibrozil Nausea And  Vomiting  . Metronidazole Nausea And Vomiting  . Morphine And Related Nausea And Vomiting  . Nitrofurantoin Nausea And Vomiting  . Olmesartan Medoxomil-Hctz Nausea And Vomiting  . Ondansetron Nausea And Vomiting  . Pentosan Polysulfate Sodium Other (See Comments)    Unknown reaction  . Pravastatin Other (See Comments)    Body aches, worsening interstitial cystitis.  Marland Kitchen  Propoxyphene N-Acetaminophen Nausea And Vomiting  . Ramipril Cough  . Sulfonamide Derivatives Nausea And Vomiting    Social History   Social History  . Marital status: Divorced    Spouse name: N/A  . Number of children: 2  . Years of education: N/A   Occupational History  . disabled    Social History Main Topics  . Smoking status: Former Smoker    Packs/day: 0.10    Years: 1.00    Types: Cigarettes    Quit date: 10/19/2012  . Smokeless tobacco: Never Used     Comment: using electronic cigarettes  . Alcohol use Yes     Comment: 2 drinks per month  . Drug use: No  . Sexual activity: Not on file   Other Topics Concern  . Not on file   Social History Narrative   Disabled   Divorced, lives alone - 1 dtr, 1 son    Family History  Problem Relation Age of Onset  . Breast cancer Mother   . Dementia Mother   . Breast cancer Maternal Grandmother   . Throat cancer Maternal Grandmother   . Esophageal cancer Maternal Grandmother   . Breast cancer Other     maternal great aunts x 5  . Rheum arthritis Other   . Colon cancer Neg Hx   . Diabetes Father   . Dementia Father   . Alzheimer's disease Father   . Diabetes Maternal Grandfather     BP 136/80   Pulse 76   Ht 5\' 6"  (1.676 m)   Wt 254 lb (115.2 kg)   BMI 41.00 kg/m   Review of Systems: See HPI above.    Objective:  Physical Exam:  Gen: NAD, comfortable in exam room  Right shoulder: No swelling, ecchymoses.  No gross deformity. Mild tenderness anterior shoulder. 90 degrees abduction, 110 flexion, 70 ER with 80 on the left. Positive  Hawkins, Neers. Negative Yergasons. Strength 5-/5 with empty can and 5/5 resisted internal/external rotation.  Pain all three motions. Negative apprehension. NV intact distally.  Left shoulder: FROM without pain.    Assessment & Plan:  1. Right shoulder pain - 2/2 adhesive capsulitis.  Clinically feels better but motion not much different.  She wanted to go ahead with intraarticular injection which was given today.  F/u in 6 weeks.  Continue home exercises, heat.  Tylenol and/or aleve as needed.  Continue fentanyl, oxycodone.  After informed written consent, patient was seated on exam table. Right shoulder was prepped with alcohol swab and utilizing posterior approach, patient's right glenohumeral space was injected with 3:1 bupivicaine: depomedrol. Patient tolerated the procedure well without immediate complications.

## 2017-01-10 ENCOUNTER — Ambulatory Visit (INDEPENDENT_AMBULATORY_CARE_PROVIDER_SITE_OTHER): Payer: Medicare Other | Admitting: Internal Medicine

## 2017-01-10 ENCOUNTER — Encounter: Payer: Self-pay | Admitting: Internal Medicine

## 2017-01-10 VITALS — BP 122/82 | HR 70 | Ht 66.0 in | Wt 262.0 lb

## 2017-01-10 DIAGNOSIS — E1159 Type 2 diabetes mellitus with other circulatory complications: Secondary | ICD-10-CM | POA: Diagnosis not present

## 2017-01-10 DIAGNOSIS — E1165 Type 2 diabetes mellitus with hyperglycemia: Secondary | ICD-10-CM | POA: Diagnosis not present

## 2017-01-10 DIAGNOSIS — IMO0002 Reserved for concepts with insufficient information to code with codable children: Secondary | ICD-10-CM

## 2017-01-10 DIAGNOSIS — E042 Nontoxic multinodular goiter: Secondary | ICD-10-CM | POA: Diagnosis not present

## 2017-01-10 MED ORDER — SITAGLIPTIN PHOSPHATE 100 MG PO TABS: 100.0000 mg | ORAL_TABLET | Freq: Every day | ORAL | 5 refills | Status: DC

## 2017-01-10 MED ORDER — METFORMIN HCL ER 500 MG PO TB24
2000.0000 mg | ORAL_TABLET | Freq: Every day | ORAL | 5 refills | Status: DC
Start: 2017-01-10 — End: 2017-05-30

## 2017-01-10 NOTE — Progress Notes (Signed)
Patient ID: [name], female   DOB: 1956/04/12, 61 y.o.   MRN: [id number]   HPI: Jenna GRINDE is a 61 y.o.-year-old female, referred by her PCP, [name], NP, for management of DM2, dx in ~2012, non-insulin-dependent, uncontrolled, with complications (cerebro-vascular ds - h/o TIA 2013).  Last hemoglobin A1c was: Lab Results  Component Value Date   HGBA1C 7.4 (H) 11/23/2016   HGBA1C 7.3 (H) 08/14/2016   HGBA1C 9.4 (H) 03/13/2016   Pt is on a regimen of: - Metformin ER 2000 mg with breakfast - Januvia 100 mg at bedtime (!) - Invokana 300 mg in am before or with b'fast  Pt does not check her sugars at home - last check 150s - ? Time of the day - am: n/c - 2h after b'fast: n/c - before lunch: n/c - 2h after lunch: n/c - before dinner: n/c - 2h after dinner: n/c - bedtime: n/c - nighttime: n/c No lows. Lowest sugar was 120; ? hypoglycemia awareness. Highest sugar was 180s.  Glucometer: One Touch ultra  Pt's meals are: - Breakfast 7-10 am: raisin toast x 2 slices; iced coffee - Lunch: skips lunch - Dinner: meat + veggies; BBQ tray; hamburger + fries - usually eats out. Usually has iced tea or Dr Malachi Bonds - regular (!); or cereals. - Snacks: icecream  - no CKD, last BUN/creatinine:  Lab Results  Component Value Date   BUN 14 11/23/2016   BUN 12 08/14/2016   CREATININE 0.73 11/23/2016   CREATININE 0.92 08/14/2016  On Losartan 100 mg daily. - last set of lipids: Lab Results  Component Value Date   CHOL 214 (H) 11/23/2016   HDL 61.70 11/23/2016   LDLCALC 131 (H) 11/23/2016   LDLDIRECT 136.5 10/11/2009   TRIG 108.0 11/23/2016   CHOLHDL 3 11/23/2016  On omega 3 FAs. - last eye exam was in 2017. No DR.  - no numbness and tingling in her feet.  Pt has FH of DM in father.   She has OSA >> does not wear her C-pap as her mask is not fitting well >> causes anxiety.  She also has a h/o thyroid nodules, s/p Benign Bx in 2011.   She has a history of  interstitial cystitis, IBS with diarrhea, restless leg syndrome, anxiety/depression, fibromyalgia, pelvic floor dysfunction.  ROS: Constitutional: + weight gain, + fatigue, + hot flashes, + poor sleep Eyes: no blurry vision, no xerophthalmia ENT: no sore throat, no nodules palpated in throat, + dysphagia/no odynophagia, no hoarseness Cardiovascular: no CP/+ SOB/no palpitations/leg swelling Respiratory: no cough/+ SOB/+ wheezing Gastrointestinal: + Nausea, diarrhea, acid reflux Musculoskeletal: no muscle/joint aches Skin: + Rash, easy bruising, hair loss Neurological: no tremors/numbness/tingling/dizziness Psychiatric: + Both: depression/anxiety + Low libido  Past Medical History:  Diagnosis Date  . Anxiety   . Arthritis    in knees  . Chronic abdominal pain   . Chronic fatigue syndrome   . Depression   . Diabetes mellitus    diet controlled  . Diverticulosis   . Dyslipidemia   . Esophageal reflux   . Fibromyalgia   . Hypertension   . IBS (irritable bowel syndrome)   . IC (interstitial cystitis)   . Obesity   . Osteopenia   . Restless leg syndrome   . Sleep apnea   . TIA (transient ischemic attack) 08/22/2012   Past Surgical History:  Procedure Laterality Date  . ABDOMINAL EXPLORATION SURGERY  1983   diverticulum removed  . APPENDECTOMY  1983  with exploration  . bladder hydrodistention  12/24/14  . BREAST CYST EXCISION  1978   left  . CERVICAL FUSION  2010  . LAPAROSCOPIC INCISIONAL / UMBILICAL / [place], 2001, 2007   x 3  . LAPAROTOMY  1982   cyst removed from right ovary and removal of adhesions  . LAPAROTOMY  1991  . NASAL SINUS SURGERY  2004  . RECTOCELE REPAIR  2009  . [place]   Removal of tumor  . TONSILLECTOMY AND ADENOIDECTOMY    . TOTAL KNEE ARTHROPLASTY     bilateral  . VAGINAL HYSTERECTOMY  1992   Social History   Social History  . Marital status: Divorced    Spouse name: N/A  . Number of children: 2    Occupational History  . disabled    Social History Main Topics  . Smoking status: Former Smoker    Packs/day: 0.10    Years: 1.00    Types: Cigarettes    Quit date: 10/19/2012  . Smokeless tobacco: Never Used     Comment: using electronic cigarettes  . Alcohol use Yes     Comment: 2 drinks per month - liquor  . Drug use: No   Social History Narrative   Disabled   Divorced, lives alone - 1 dtr, 1 son   Current Outpatient Prescriptions on File Prior to Visit  Medication Sig Dispense Refill  . ALPRAZolam (XANAX) 1 MG tablet Take 1 tablet (1 mg total) by mouth 3 (three) times daily as needed for anxiety. 30 tablet 0  . amLODipine (NORVASC) 10 MG tablet TAKE 1 TABLET BY MOUTH EVERY NIGHT AT BEDTIME 30 tablet 5  . aspirin EC 325 MG tablet Take 325 mg by mouth daily.    Marland Kitchen bismuth subsalicylate (PEPTO-BISMOL) 262 MG/15ML suspension Take 30 mLs by mouth every 6 (six) hours as needed. 360 mL 0  . Brexpiprazole 2 MG TABS Take 1 tablet by mouth daily.    . canagliflozin (INVOKANA) 300 MG TABS tablet Take 1 tablet (300 mg total) by mouth daily before breakfast. 30 tablet 5  . desvenlafaxine (PRISTIQ) 100 MG 24 hr tablet TK 1 T PO D  0  . Diclofenac Sodium (PENNSAID) 2 % SOLN Place 1 application onto the skin at bedtime as needed (knee pain).    Marland Kitchen dicyclomine (BENTYL) 20 MG tablet TAKE 1 TABLET(20 MG) BY MOUTH EVERY 6 HOURS (Patient taking differently: Take 10 mg 3x daily as needed.) 30 tablet 0  . esomeprazole (NEXIUM) 40 MG capsule TAKE 1 CAPSULE BY MOUTH EVERY DAY AT NOON (Patient taking differently: Take one at bedtime.) 30 capsule 5  . fentaNYL (DURAGESIC) 100 MCG/HR Place 1 patch (100 mcg total) onto the skin every other day. 5 patch 0  . furosemide (LASIX) 20 MG tablet TAKE 1 TABLET BY MOUTH DAILY 30 tablet 5  . JANUVIA 100 MG tablet TAKE 1 TABLET BY MOUTH EVERY NIGHT AT BEDTIME 30 tablet 5  . lidocaine (LIDODERM) 5 % Place 2 patches onto the skin at bedtime as needed (hip pain).     Marland Kitchen  losartan (COZAAR) 100 MG tablet TAKE 1 TABLET BY MOUTH DAILY 30 tablet 5  . metFORMIN (GLUCOPHAGE-XR) 500 MG 24 hr tablet TAKE 4 TABLETS BY MOUTH DAILY WITH BREAKFAST 120 tablet 5  . metoprolol succinate (TOPROL-XL) 50 MG 24 hr tablet Take 1 tablet (50 mg total) by mouth daily. Take with or immediately following a meal. 30 tablet 3  .  mirabegron ER (MYRBETRIQ) 50 MG TB24 Take 50 mg by mouth at bedtime.     Marland Kitchen nystatin cream (MYCOSTATIN) Apply 1 application topically 2 (two) times daily. 30 g 2  . Oxycodone HCl 10 MG TABS Take 10 mg by mouth every 8 (eight) hours as needed.  0  . promethazine (PHENERGAN) 25 MG tablet Take 25 mg by mouth daily as needed for nausea or vomiting.     Marland Kitchen rOPINIRole (REQUIP) 4 MG tablet TAKE 1 TABLET(4 MG) BY MOUTH AT BEDTIME 30 tablet 5  . tiZANidine (ZANAFLEX) 4 MG tablet Take 1 tablet (4 mg total) by mouth at bedtime. 30 tablet 0  . topiramate (TOPAMAX) 50 MG tablet Take 50 mg by mouth 2 (two) times daily as needed (headaches).     . traZODone (DESYREL) 100 MG tablet Take 100 mg by mouth at bedtime. Take 1 - 2 tablets at bedtime as needed.    Marland Kitchen UNABLE TO FIND C-Pap machine , use nightly    . ferrous sulfate 325 (65 FE) MG tablet Take 325 mg by mouth daily with breakfast.    . glucose blood (ONE TOUCH ULTRA TEST) test strip Use as instructed. Dx: 250.00 (Patient not taking: Reported on 01/10/2017) 100 each 6  . Lancets (ONETOUCH ULTRASOFT) lancets Use as instructed (Patient not taking: Reported on 01/10/2017) 100 each 12  . Omega-3 Fatty Acids (FISH OIL) 1000 MG CAPS Take 1,000 mg by mouth daily.    . polycarbophil (FIBERCON) 625 MG tablet Take 1 tablet (625 mg total) by mouth daily. (Patient not taking: Reported on 01/10/2017) 30 tablet 0  . potassium gluconate 595 MG TABS tablet Take 595 mg by mouth daily as needed (leg cramps).    . valACYclovir (VALTREX) 1000 MG tablet Take 1 tablet (1,000 mg total) by mouth 3 (three) times daily. (Patient not taking: Reported on  01/10/2017) 30 tablet 0  . [DISCONTINUED] pravastatin (PRAVACHOL) 20 MG tablet Take 1 tablet (20 mg total) by mouth daily. 30 tablet 3   No current facility-administered medications on file prior to visit.    Allergies  Allergen Reactions  . Atorvastatin     Body aches, worsened interstitial cystitis.  . Buprenorphine Hcl Nausea And Vomiting  . Chlorzoxazone Other (See Comments) and Nausea Only    Unknown reaction  . Gabapentin Nausea And Vomiting  . Gemfibrozil Nausea And Vomiting  . Metronidazole Nausea And Vomiting  . Morphine And Related Nausea And Vomiting  . Nitrofurantoin Nausea And Vomiting  . Olmesartan Medoxomil-Hctz Nausea And Vomiting  . Ondansetron Nausea And Vomiting  . Pentosan Polysulfate Sodium Other (See Comments)    Unknown reaction  . Pravastatin Other (See Comments)    Body aches, worsening interstitial cystitis.  Marland Kitchen Propoxyphene N-Acetaminophen Nausea And Vomiting  . Ramipril Cough  . Sulfonamide Derivatives Nausea And Vomiting   Family History  Problem Relation Age of Onset  . Breast cancer Mother   . Dementia Mother   . Breast cancer Maternal Grandmother   . Throat cancer Maternal Grandmother   . Esophageal cancer Maternal Grandmother   . Breast cancer Other     maternal great aunts x 5  . Rheum arthritis Other   . Colon cancer Neg Hx   . Diabetes Father   . Dementia Father   . Alzheimer's disease Father   . Diabetes Maternal Grandfather    PE: BP 122/82 (BP Location: Left Arm, Patient Position: Sitting)   Pulse 70   Ht 5\' 6"  (1.676 m)  Wt 262 lb (118.8 kg)   SpO2 95%   BMI 42.29 kg/m  Wt Readings from Last 3 Encounters:  01/10/17 262 lb (118.8 kg)  01/07/17 254 lb (115.2 kg)  12/24/16 255 lb 12.8 oz (116 kg)   Constitutional: obese, in NAD Eyes: PERRLA, EOMI, no exophthalmos ENT: moist mucous membranes, + lumpy bumpy thyroid, no cervical lymphadenopathy Cardiovascular: RRR, No MRG Respiratory: CTA B Gastrointestinal: abdomen soft,  NT, ND, BS+ Musculoskeletal: no deformities, strength intact in all 4 Skin: moist, warm, no rashes Neurological: no tremor with outstretched hands, DTR normal in all 4  ASSESSMENT: 1. DM2, non-insulin-dependent, uncontrolled, with complications - Cerebrovascular disease- h/o TIA in 2013  2. Thyroid nodules  PLAN:  1. Patient with long-standing, uncontrolled diabetes, on oral antidiabetic regimen, with reasonable control at last check but not at goal. Patient is not checking sugars at home, so it is difficult to evaluate her control in the last 2 months. I strongly advised her to start taking 1-2 times a day, rotating check times. - For now, we discussed about improving diet. She is eating out every night and basically eats one large meal a day (dinner). I advised her that this practice would hinder good diabetes control, especially since she is also having sweet drinks with this meal. She will try to work on eating less out and reducing sweet drinks. She refuses a nutrition referral for now. - We also discussed about optimizing the way she is taking her medicines, by moving metformin with dinner and Januvia before breakfast but I cannot make any other changes for now - I will have her back in a month and a half with her sugar log and we will adjust the regimen at that time. - I suggested to:  Patient Instructions  Please move Metformin 2000 mg with dinner.  Continue Invokana 300 mg in am, before b'fast.  Please move Januvia 100 mg before b'fast.  Eliminate regular drinks.  Try to eat out less.  Please return in 1.5 months with your sugar log.   - given sugar log and advised how to fill it and to bring it at next appt  - given foot care handout and explained the principles  - given instructions for hypoglycemia management "15-15 rule"  - advised for yearly eye exams >> she is UTD - Return to clinic in 1.5 mo with sugar log   2. Thyroid nodules -  We only addressed this briefly  today - I reviewed her previous thyroid ultrasound from 2011 And this showed small nodules. One of them was biopsied and the biopsy was benign.  - She does have some dysphagia  - We discussed about the possibility of repeating the thyroid ultrasound this year. I will order this at next visit.  - Reviewed her most recent TFTs, normal in 04/2015   Philemon Kingdom, MD PhD [facility]

## 2017-01-10 NOTE — Patient Instructions (Addendum)
Please move Metformin 2000 mg with dinner.  Continue Invokana 300 mg in am, before b'fast.  Please move Januvia 100 mg before b'fast.  Eliminate regular drinks.  Try to eat out less.  Please return in 1.5 months with your sugar log.   Marland KitchenPATIENT INSTRUCTIONS FOR TYPE 2 DIABETES:  DIET AND EXERCISE Diet and exercise is an important part of diabetic treatment.  We recommended aerobic exercise in the form of brisk walking (working between 40-60% of maximal aerobic capacity, similar to brisk walking) for 150 minutes per week (such as 30 minutes five days per week) along with 3 times per week performing 'resistance' training (using various gauge rubber tubes with handles) 5-10 exercises involving the major muscle groups (upper body, lower body and core) performing 10-15 repetitions (or near fatigue) each exercise. Start at half the above goal but build slowly to reach the above goals. If limited by weight, joint pain, or disability, we recommend daily walking in a swimming pool with water up to waist to reduce pressure from joints while allow for adequate exercise.    BLOOD GLUCOSES Monitoring your blood glucoses is important for continued management of your diabetes. Please check your blood glucoses 2-4 times a day: fasting, before meals and at bedtime (you can rotate these measurements - e.g. one day check before the 3 meals, the next day check before 2 of the meals and before bedtime, etc.).   HYPOGLYCEMIA (low blood sugar) Hypoglycemia is usually a reaction to not eating, exercising, or taking too much insulin/ other diabetes drugs.  Symptoms include tremors, sweating, hunger, confusion, headache, etc. Treat IMMEDIATELY with 15 grams of Carbs: . 4 glucose tablets .  cup regular juice/soda . 2 tablespoons raisins . 4 teaspoons sugar . 1 tablespoon honey Recheck blood glucose in 15 mins and repeat above if still symptomatic/blood glucose <100.  RECOMMENDATIONS TO REDUCE YOUR RISK OF  DIABETIC COMPLICATIONS: * Take your prescribed MEDICATION(S) * Follow a DIABETIC diet: Complex carbs, fiber rich foods, (monounsaturated and polyunsaturated) fats * AVOID saturated/trans fats, high fat foods, >2,300 mg salt per day. * EXERCISE at least 5 times a week for 30 minutes or preferably daily.  * DO NOT SMOKE OR DRINK more than 1 drink a day. * Check your FEET every day. Do not wear tightfitting shoes. Contact us if you develop an ulcer * See your EYE doctor once a year or more if needed * Get a FLU shot once a year * Get a PNEUMONIA vaccine once before and once after age 26 years  GOALS:  * Your Hemoglobin A1c of <7%  * fasting sugars need to be <130 * after meals sugars need to be <180 (2h after you start eating) * Your Systolic BP should be [id number] or lower  * Your Diastolic BP should be 80 or lower  * Your HDL (Good Cholesterol) should be 40 or higher  * Your LDL (Bad Cholesterol) should be 100 or lower. * Your Triglycerides should be 150 or lower  * Your Urine microalbumin (kidney function) should be <30 * Your Body Mass Index should be 25 or lower    Please consider the following ways to cut down carbs and fat and increase fiber and micronutrients in your diet: - substitute whole grain for white bread or pasta - substitute brown rice for white rice - substitute 90-calorie flat bread pieces for slices of bread when possible - substitute sweet potatoes or yams for white potatoes - substitute humus for margarine - substitute  tofu for cheese when possible - substitute almond or rice milk for regular milk (would not drink soy milk daily due to concern for soy estrogen influence on breast cancer risk) - substitute dark chocolate for other sweets when possible - substitute water - can add lemon or orange slices for taste - for diet sodas (artificial sweeteners will trick your body that you can eat sweets without getting calories and will lead you to overeating and weight gain in  the long run) - do not skip breakfast or other meals (this will slow down the metabolism and will result in more weight gain over time)  - can try smoothies made from fruit and almond/rice milk in am instead of regular breakfast - can also try old-fashioned (not instant) oatmeal made with almond/rice milk in am - order the dressing on the side when eating salad at a restaurant (pour less than half of the dressing on the salad) - eat as little meat as possible - can try juicing, but should not forget that juicing will get rid of the fiber, so would alternate with eating raw veg./fruits or drinking smoothies - use as little oil as possible, even when using olive oil - can dress a salad with a mix of balsamic vinegar and lemon juice, for e.g. - use agave nectar, stevia sugar, or regular sugar rather than artificial sweateners - steam or broil/roast veggies  - snack on veggies/fruit/nuts (unsalted, preferably) when possible, rather than processed foods - reduce or eliminate aspartame in diet (it is in diet sodas, chewing gum, etc) Read the labels!  Try to read Dr. Janene Harvey book: "Program for Reversing Diabetes" for other ideas for healthy eating.

## 2017-01-15 ENCOUNTER — Ambulatory Visit (INDEPENDENT_AMBULATORY_CARE_PROVIDER_SITE_OTHER): Payer: Medicare Other | Admitting: Family Medicine

## 2017-01-15 ENCOUNTER — Encounter: Payer: Self-pay | Admitting: Family Medicine

## 2017-01-15 DIAGNOSIS — I10 Essential (primary) hypertension: Secondary | ICD-10-CM | POA: Diagnosis not present

## 2017-01-15 NOTE — Progress Notes (Signed)
Pre visit review using our clinic tool,if applicable. No additional management support is needed unless otherwise documented below in the visit note.   Patient in for BP check ordered by M. O'sullivan on 12/24/16.  Patient states she forgot to take BP medications today. No complaints voiced. Advised patient to take BP medications at the same time daily and it may be easier for her to remember taking them. Patient agreed. Advised to continie to monitor her salt intake which will enable her to BP to remain in acceptable range.   BP= 136/66 P=77  Per Dr. Carollee Herter patient to continue medications as ordered and return for follow up with M. O'sullivan in 3 months. Appointment scheduled .

## 2017-01-15 NOTE — Progress Notes (Signed)
Noted  

## 2017-02-01 ENCOUNTER — Telehealth: Payer: Self-pay | Admitting: Family

## 2017-02-01 MED ORDER — ONETOUCH DELICA LANCETS 33G MISC: 3 refills | Status: DC

## 2017-02-01 NOTE — Telephone Encounter (Signed)
Spoke with pharmacy, they think meter uses onetouch delica lancets. Rx sent. Left detailed message on pt's cell# and to call if any questions.

## 2017-02-01 NOTE — Telephone Encounter (Signed)
Pt called in because the lancets that she last received are incorrect. Pt says that they don't fit her meter. Pt says that she has the Ultra Touch Mini meter. She says that the correct lancets are a bluish/ gray color if that helps, pt isn't sure of the name. Pt is available if needed at her mobile # today

## 2017-02-11 ENCOUNTER — Ambulatory Visit: Payer: Medicare Other | Admitting: Family

## 2017-02-13 ENCOUNTER — Telehealth: Payer: Self-pay | Admitting: Internal Medicine

## 2017-02-13 NOTE — Telephone Encounter (Signed)
Please advise. Thank you

## 2017-02-13 NOTE — Telephone Encounter (Signed)
Patient stated her legs swelling really tight and want to come in sooner. Please advise

## 2017-02-14 NOTE — Telephone Encounter (Signed)
This is likely not related to her diabetes. Please advised her to schedule an appointment with PCP.

## 2017-02-14 NOTE — Telephone Encounter (Signed)
Patient notified of MD's response and voiced understanding.

## 2017-02-18 ENCOUNTER — Ambulatory Visit: Payer: Medicare Other | Admitting: Family Medicine

## 2017-02-20 ENCOUNTER — Ambulatory Visit: Payer: Medicare Other | Admitting: Family

## 2017-02-27 ENCOUNTER — Ambulatory Visit: Payer: Medicare Other | Admitting: Family

## 2017-03-06 ENCOUNTER — Other Ambulatory Visit: Payer: Self-pay | Admitting: Family

## 2017-03-08 ENCOUNTER — Ambulatory Visit: Payer: Medicare Other | Admitting: Internal Medicine

## 2017-04-06 ENCOUNTER — Other Ambulatory Visit: Payer: Self-pay | Admitting: Family

## 2017-04-08 ENCOUNTER — Ambulatory Visit (INDEPENDENT_AMBULATORY_CARE_PROVIDER_SITE_OTHER): Payer: Medicare Other | Admitting: Family

## 2017-04-08 ENCOUNTER — Encounter: Payer: Self-pay | Admitting: Family

## 2017-04-08 VITALS — BP 150/78 | HR 80 | Temp 98.3°F | Resp 18 | Ht 65.0 in | Wt 257.6 lb

## 2017-04-08 DIAGNOSIS — E1159 Type 2 diabetes mellitus with other circulatory complications: Secondary | ICD-10-CM | POA: Diagnosis not present

## 2017-04-08 DIAGNOSIS — F329 Major depressive disorder, single episode, unspecified: Secondary | ICD-10-CM

## 2017-04-08 DIAGNOSIS — F32A Depression, unspecified: Secondary | ICD-10-CM

## 2017-04-08 DIAGNOSIS — E1165 Type 2 diabetes mellitus with hyperglycemia: Secondary | ICD-10-CM

## 2017-04-08 DIAGNOSIS — I10 Essential (primary) hypertension: Secondary | ICD-10-CM | POA: Diagnosis not present

## 2017-04-08 DIAGNOSIS — IMO0002 Reserved for concepts with insufficient information to code with codable children: Secondary | ICD-10-CM

## 2017-04-08 LAB — COMPREHENSIVE METABOLIC PANEL
ALT: 20 U/L (ref 0–35)
AST: 19 U/L (ref 0–37)
Albumin: 4.3 g/dL (ref 3.5–5.2)
Alkaline Phosphatase: 77 U/L (ref 39–117)
BILIRUBIN TOTAL: 0.4 mg/dL (ref 0.2–1.2)
BUN: 11 mg/dL (ref 6–23)
CALCIUM: 9.7 mg/dL (ref 8.4–10.5)
CHLORIDE: 97 meq/L (ref 96–112)
CO2: 31 meq/L (ref 19–32)
Creatinine, Ser: 0.94 mg/dL (ref 0.40–1.20)
GFR: 64.62 mL/min (ref >60.00)
Glucose, Bld: 171 mg/dL — ABNORMAL HIGH (ref 70–99)
Potassium: 4.5 mEq/L (ref 3.5–5.1)
Sodium: 136 mEq/L (ref 135–145)
Total Protein: 7.3 g/dL (ref 6.0–8.3)

## 2017-04-08 MED ORDER — METOPROLOL SUCCINATE ER 100 MG PO TB24: 100.0000 mg | ORAL_TABLET | Freq: Every day | ORAL | 2 refills | Status: DC

## 2017-04-08 NOTE — Assessment & Plan Note (Signed)
BP is elevated today. Will increase toprol xl from 50mg  to 100mg .

## 2017-04-08 NOTE — Assessment & Plan Note (Addendum)
Uncontrolled. This is managed by psychiatry.  She reports that she feels hopeless. Scored 20 on PHQ-9.  She is agreeable to go to the ER if she develops active thoughts of hurting self/others. I have sent an outside staff message to her Psychiatric NP via Care Everywhere with update on her status. Pt promised to keep her upcoming psychiatric appointment on Friday.
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
## 2017-04-08 NOTE — Assessment & Plan Note (Signed)
Managed by endo

## 2017-04-08 NOTE — Patient Instructions (Addendum)
Please complete lab work prior to leaving.  Increase metoprolol from 50mg  to 100mg  once daily. Follow up as scheduled with psychiatry and endocrinology. Please call 911 or go directly to the ER if you develop thoughts of hurting yourself or others.

## 2017-04-11 ENCOUNTER — Ambulatory Visit: Payer: Medicare Other | Admitting: Internal Medicine

## 2017-04-11 ENCOUNTER — Telehealth: Payer: Self-pay | Admitting: Internal Medicine

## 2017-04-11 ENCOUNTER — Telehealth: Payer: Self-pay

## 2017-04-11 NOTE — Telephone Encounter (Signed)
Called patient and advised of opening for Dr.Gherghe's schedule. We rescheduled patients appointment, she had no questions at this time.

## 2017-04-11 NOTE — Telephone Encounter (Signed)
Patient called to cancel and reschedule appointment for today at 1:30 PM. Patient states she is sick.  Cancelled appointment, however could not find any openings. Patient stated she needs an early afternoon appointment.

## 2017-04-16 ENCOUNTER — Ambulatory Visit: Payer: Medicare Other | Admitting: Family

## 2017-04-16 DIAGNOSIS — N3941 Urge incontinence: Secondary | ICD-10-CM | POA: Insufficient documentation

## 2017-04-17 ENCOUNTER — Other Ambulatory Visit: Payer: Self-pay | Admitting: Family

## 2017-04-18 NOTE — Telephone Encounter (Signed)
Requesting Bentyl 20mg -Take 1 tablet by mouth every 6 hours. Last ordered:06/06/16;#30,0 Last OV:04/08/17 Please advise.//AB/CMA

## 2017-05-11 ENCOUNTER — Other Ambulatory Visit: Payer: Self-pay | Admitting: Family

## 2017-05-13 ENCOUNTER — Ambulatory Visit: Payer: Medicare Other | Admitting: Family

## 2017-05-13 NOTE — Telephone Encounter (Signed)
Refill sent per [facility] refill protocol/SLS  

## 2017-05-20 ENCOUNTER — Ambulatory Visit: Payer: Medicare Other | Admitting: Family

## 2017-05-20 ENCOUNTER — Telehealth: Payer: Self-pay | Admitting: Family

## 2017-05-20 NOTE — Telephone Encounter (Addendum)
Patient states she left 2x voicemail cancelling her 1:15pm appointment for today due to her not feeling well, patient [facility] to 06/10/17, charge or no charge

## 2017-05-20 NOTE — Telephone Encounter (Signed)
No charge. 

## 2017-05-30 ENCOUNTER — Other Ambulatory Visit: Payer: Self-pay | Admitting: Family

## 2017-06-07 ENCOUNTER — Telehealth: Payer: Self-pay | Admitting: *Deleted

## 2017-06-07 NOTE — Telephone Encounter (Signed)
AWV scheduled for 06/10/17 @1 , then PCP @130 .

## 2017-06-07 NOTE — Progress Notes (Deleted)
Subjective:   [name] is a 61 y.o. female who presents for Medicare Annual (Subsequent) preventive examination.  Review of Systems:  No ROS.  Medicare Wellness Visit. Additional risk factors are reflected in the social history.    Sleep patterns:  Home Safety/Smoke Alarms: Feels safe in home. Smoke alarms in place.  Living environment; residence and Firearm Safety:  Friendly Safety/Bike Helmet: Wears seat belt.   Counseling:   Eye Exam-  Dental-  Female:   Pap-  Hysterectomy.   Mammo-  Last 11/03/16:BI-RADS CATEGORY  1: Negative.      Dexa scan- Last 06/13/15:  No result on file.    CCS- Last 08/14/11: Normal. Recall 10 yrs.  Objective:     Vitals: There were no vitals taken for this visit.  There is no height or weight on file to calculate BMI.   Tobacco History  Smoking Status  . Former Smoker  . Packs/day: 0.10  . Years: 1.00  . Types: Cigarettes  . Quit date: 10/19/2012  Smokeless Tobacco  . Never Used    Comment: using electronic cigarettes     Counseling given: Not Answered   Past Medical History:  Diagnosis Date  . Anxiety   . Arthritis    in knees  . Chronic abdominal pain   . Chronic fatigue syndrome   . Depression   . Diabetes mellitus    diet controlled  . Diverticulosis   . Dyslipidemia   . Esophageal reflux   . Fibromyalgia   . Hypertension   . IBS (irritable bowel syndrome)   . IC (interstitial cystitis)   . Obesity   . Osteopenia   . Restless leg syndrome   . Sleep apnea   . TIA (transient ischemic attack) 08/22/2012   Past Surgical History:  Procedure Laterality Date  . ABDOMINAL EXPLORATION SURGERY  1983   diverticulum removed  . APPENDECTOMY  1983   with exploration  . bladder hydrodistention  12/24/14  . BREAST CYST EXCISION  1978   left  . CERVICAL FUSION  2010  . LAPAROSCOPIC INCISIONAL / UMBILICAL / [place], 2001, 2007   x 3  . LAPAROTOMY  1982   cyst removed from right ovary and removal  of adhesions  . LAPAROTOMY  1991  . NASAL SINUS SURGERY  2004  . RECTOCELE REPAIR  2009  . [place]   Removal of tumor  . TONSILLECTOMY AND ADENOIDECTOMY    . TOTAL KNEE ARTHROPLASTY     bilateral  . VAGINAL HYSTERECTOMY  1992   Family History  Problem Relation Age of Onset  . Breast cancer Mother   . Dementia Mother   . Breast cancer Maternal Grandmother   . Throat cancer Maternal Grandmother   . Esophageal cancer Maternal Grandmother   . Breast cancer Other        maternal great aunts x 5  . Rheum arthritis Other   . Colon cancer Neg Hx   . Diabetes Father   . Dementia Father   . Alzheimer's disease Father   . Diabetes Maternal Grandfather    History  Sexual Activity  . Sexual activity: Not on file    Outpatient Encounter Prescriptions as of 06/10/2017  Medication Sig  . ALPRAZolam (XANAX) 1 MG tablet Take 1 tablet (1 mg total) by mouth 3 (three) times daily as needed for anxiety.  Marland Kitchen amLODipine (NORVASC) 10 MG tablet TAKE 1 TABLET BY MOUTH EVERY NIGHT AT  BEDTIME  . aspirin EC 325 MG tablet Take 325 mg by mouth daily.  Marland Kitchen bismuth subsalicylate (PEPTO-BISMOL) 262 MG/15ML suspension Take 30 mLs by mouth every 6 (six) hours as needed.  . Brexpiprazole 2 MG TABS Take 1 tablet by mouth daily.  . canagliflozin (INVOKANA) 300 MG TABS tablet Take 1 tablet (300 mg total) by mouth daily before breakfast.  . desvenlafaxine (PRISTIQ) 100 MG 24 hr tablet TK 1 T PO D  . Diclofenac Sodium (PENNSAID) 2 % SOLN Place 1 application onto the skin at bedtime as needed (knee pain).  Marland Kitchen dicyclomine (BENTYL) 20 MG tablet TAKE 1 TABLET(20 MG) BY MOUTH EVERY 6 HOURS  . esomeprazole (NEXIUM) 40 MG capsule TAKE 1 CAPSULE BY MOUTH EVERY DAY AT NOON  . fentaNYL (DURAGESIC) 100 MCG/HR Place 1 patch (100 mcg total) onto the skin every other day.  . ferrous sulfate 325 (65 FE) MG tablet Take 325 mg by mouth daily with breakfast.  . furosemide (LASIX) 20 MG tablet TAKE 1 TABLET BY MOUTH  DAILY  . glucose blood (ONE TOUCH ULTRA TEST) test strip Use as instructed. Dx: 250.00  . lidocaine (LIDODERM) 5 % Place 2 patches onto the skin at bedtime as needed (hip pain).   Marland Kitchen losartan (COZAAR) 100 MG tablet TAKE 1 TABLET BY MOUTH DAILY  . metFORMIN (GLUCOPHAGE-XR) 500 MG 24 hr tablet TAKE 4 TABLETS BY MOUTH DAILY WITH BREAKFAST  . metoprolol succinate (TOPROL-XL) 100 MG 24 hr tablet Take 1 tablet (100 mg total) by mouth daily. Take with or immediately following a meal.  . mirabegron ER (MYRBETRIQ) 50 MG TB24 Take 50 mg by mouth at bedtime.   Marland Kitchen nystatin cream (MYCOSTATIN) Apply 1 application topically 2 (two) times daily.  . Omega-3 Fatty Acids (FISH OIL) 1000 MG CAPS Take 1,000 mg by mouth daily.  Glory Rosebush DELICA LANCETS 57Q MISC Use to check blood sugar 1-2 times daily.  Dx E11.65  . Oxycodone HCl 10 MG TABS Take 10 mg by mouth every 8 (eight) hours as needed.  . polycarbophil (FIBERCON) 625 MG tablet Take 1 tablet (625 mg total) by mouth daily.  . potassium gluconate 595 MG TABS tablet Take 595 mg by mouth daily as needed (leg cramps).  . promethazine (PHENERGAN) 25 MG tablet Take 25 mg by mouth daily as needed for nausea or vomiting.   Marland Kitchen rOPINIRole (REQUIP) 4 MG tablet TAKE 1 TABLET(4 MG) BY MOUTH AT BEDTIME  . sitaGLIPtin (JANUVIA) 100 MG tablet Take 1 tablet (100 mg total) by mouth daily with breakfast.  . tiZANidine (ZANAFLEX) 4 MG tablet Take 1 tablet (4 mg total) by mouth at bedtime.  . topiramate (TOPAMAX) 50 MG tablet Take 50 mg by mouth 2 (two) times daily as needed (headaches).   . traZODone (DESYREL) 100 MG tablet Take 100 mg by mouth at bedtime. Take 1 - 2 tablets at bedtime as needed.  Marland Kitchen UNABLE TO FIND C-Pap machine , use nightly  . valACYclovir (VALTREX) 1000 MG tablet Take 1 tablet (1,000 mg total) by mouth 3 (three) times daily.   No facility-administered encounter medications on file as of 06/10/2017.     Activities of Daily Living No flowsheet data  found.  Patient Care Team: [name], NP as PCP - General (Internal Medicine) Domingo Pulse, MD as Consulting Physician (Urology) Jodi Marble, MD as Consulting Physician (Otolaryngology) Arvella Nigh, MD as Consulting Physician (Obstetrics and Gynecology) Paralee Cancel, MD as Consulting Physician (Orthopedic Surgery) Rainwater, Willey Blade, PA-C as  Physician Assistant (Physician Assistant) Cvejin, Sheppard Coil, MD as Referring Physician (Psychiatry)    Assessment:    Physical assessment deferred to PCP.  Exercise Activities and Dietary recommendations   Diet (meal preparation, eat out, water intake, caffeinated beverages, dairy products, fruits and vegetables): {Desc; diets:16563} Breakfast: Lunch:  Dinner:      Goals    None     Fall Risk Fall Risk  03/13/2016 08/06/2013 08/06/2013  Falls in the past year? No Yes Yes  Number falls in past yr: - 1 -  Risk for fall due to : - - History of fall(s)   Depression Screen PHQ 2/9 Scores 04/08/2017 03/13/2016 08/06/2013  PHQ - 2 Score 6 5 6   PHQ- 9 Score 26 11 7      Cognitive Function        Immunization History  Administered Date(s) Administered  . Influenza Split 08/03/2011, 07/29/2012  . Influenza Whole 08/19/2009  . Influenza,inj,Quad PF,36+ Mos 07/31/2013, 08/02/2014, 08/14/2016  . Influenza-Unspecified 08/30/2015  . Pneumococcal Polysaccharide-23 09/24/2012  . Tdap 09/24/2012  . Zoster 05/11/2015  . Zoster Recombinat (Shingrix) 02/18/2017, 04/29/2017   Screening Tests Health Maintenance  Topic Date Due  . HEMOGLOBIN A1C  05/23/2017  . INFLUENZA VACCINE  06/19/2017  . OPHTHALMOLOGY EXAM  06/19/2017  . PNEUMOCOCCAL POLYSACCHARIDE VACCINE (2) 09/24/2017  . FOOT EXAM  11/23/2017  . MAMMOGRAM  11/03/2018  . COLONOSCOPY  08/13/2021  . TETANUS/TDAP  09/24/2022  . Hepatitis C Screening  Completed  . HIV Screening  Completed      Plan:   ***   I have personally reviewed and noted the following in  the patient's chart:   . Medical and social history . Use of alcohol, tobacco or illicit drugs  . Current medications and supplements . Functional ability and status . Nutritional status . Physical activity . Advanced directives . List of other physicians . Hospitalizations, surgeries, and ER visits in previous 12 months . Vitals . Screenings to include cognitive, depression, and falls . Referrals and appointments  In addition, I have reviewed and discussed with patient certain preventive protocols, quality metrics, and best practice recommendations. A written personalized care plan for preventive services as well as general preventive health recommendations were provided to patient.     Naaman Plummer [place], [place]  06/07/2017

## 2017-06-10 ENCOUNTER — Ambulatory Visit (INDEPENDENT_AMBULATORY_CARE_PROVIDER_SITE_OTHER): Payer: Medicare Other | Admitting: Family

## 2017-06-10 ENCOUNTER — Encounter: Payer: Self-pay | Admitting: Family

## 2017-06-10 VITALS — BP 117/73 | HR 64 | Ht 65.0 in | Wt 251.6 lb

## 2017-06-10 DIAGNOSIS — R197 Diarrhea, unspecified: Secondary | ICD-10-CM

## 2017-06-10 LAB — COMPREHENSIVE METABOLIC PANEL
ALBUMIN: 3.6 g/dL (ref 3.5–5.2)
ALK PHOS: 61 U/L (ref 39–117)
ALT: 14 U/L (ref 0–35)
AST: 13 U/L (ref 0–37)
BUN: 16 mg/dL (ref 6–23)
CHLORIDE: 100 meq/L (ref 96–112)
CO2: 28 mEq/L (ref 19–32)
Calcium: 9.1 mg/dL (ref 8.4–10.5)
Creatinine, Ser: 0.85 mg/dL (ref 0.40–1.20)
GFR: 72.53 mL/min (ref >60.00)
GLUCOSE: 176 mg/dL — AB (ref 70–99)
POTASSIUM: 4.1 meq/L (ref 3.5–5.1)
SODIUM: 135 meq/L (ref 135–145)
TOTAL PROTEIN: 5.9 g/dL — AB (ref 6.0–8.3)
Total Bilirubin: 0.3 mg/dL (ref 0.2–1.2)

## 2017-06-10 LAB — CBC WITH DIFFERENTIAL/PLATELET
BASOS PCT: 0 % (ref 0.0–3.0)
Basophils Absolute: 0 10*3/uL (ref 0.0–0.1)
EOS PCT: 2.1 % (ref 0.0–5.0)
Eosinophils Absolute: 0.1 10*3/uL (ref 0.0–0.7)
HCT: 40.2 % (ref 36.0–46.0)
Hemoglobin: 12.8 g/dL (ref 12.0–15.0)
LYMPHS ABS: 1.7 10*3/uL (ref 0.7–4.0)
Lymphocytes Relative: 30.8 % (ref 12.0–46.0)
MCHC: 32 g/dL (ref 30.0–36.0)
MCV: 83 fl (ref 78.0–100.0)
MONO ABS: 0.3 10*3/uL (ref 0.1–1.0)
MONOS PCT: 5.2 % (ref 3.0–12.0)
NEUTROS ABS: 3.4 10*3/uL (ref 1.4–7.7)
NEUTROS PCT: 61.9 % (ref 43.0–77.0)
Platelets: 183 10*3/uL (ref 150.0–400.0)
RBC: 4.84 Mil/uL (ref 3.87–5.11)
RDW: 16.6 % — ABNORMAL HIGH (ref 11.5–15.5)
WBC: 5.6 10*3/uL (ref 4.0–10.5)

## 2017-06-10 NOTE — Patient Instructions (Signed)
Please hold metformin tonight and tomorrow night and Wednesday.  Call or send me a mychart message on Thursday AM to let me know if your diarrhea is improved. Please complete stool studies and return to the lab at your earliest convenience.

## 2017-06-10 NOTE — Progress Notes (Signed)
Subjective:    Patient ID: [name], female    DOB: [birth year], 61 y.o.   MRN: [id number]  HPI   Ms. Castelli is a 61 yr old female who presents today with chief complaint of diarrhea. Reports about 3 loose stools a day. Has been occurring x 5 weeks. Denies black/bloody stools, fevers.  She is maintained on metformin.   Review of Systems See HPI  Past Medical History:  Diagnosis Date  . Anxiety   . Arthritis    in knees  . Chronic abdominal pain   . Chronic fatigue syndrome   . Depression   . Diabetes mellitus    diet controlled  . Diverticulosis   . Dyslipidemia   . Esophageal reflux   . Fibromyalgia   . Hypertension   . IBS (irritable bowel syndrome)   . IC (interstitial cystitis)   . Obesity   . Osteopenia   . Restless leg syndrome   . Sleep apnea   . TIA (transient ischemic attack) 08/22/2012     Social History   Social History  . Marital status: Divorced    Spouse name: N/A  . Number of children: 2  . Years of education: N/A   Occupational History  . disabled    Social History Main Topics  . Smoking status: Former Smoker    Packs/day: 0.10    Years: 1.00    Types: Cigarettes    Quit date: 10/19/2012  . Smokeless tobacco: Never Used     Comment: using electronic cigarettes  . Alcohol use Yes     Comment: 2 drinks per month  . Drug use: No  . Sexual activity: Not on file   Other Topics Concern  . Not on file   Social History Narrative   Disabled   Divorced, lives alone - 1 dtr, 1 son    Past Surgical History:  Procedure Laterality Date  . ABDOMINAL EXPLORATION SURGERY  1983   diverticulum removed  . APPENDECTOMY  1983   with exploration  . bladder hydrodistention  12/24/14  . BREAST CYST EXCISION  1978   left  . CERVICAL FUSION  2010  . LAPAROSCOPIC INCISIONAL / UMBILICAL / [place], 2001, 2007   x 3  . LAPAROTOMY  1982   cyst removed from right ovary and removal of adhesions  . LAPAROTOMY  1991  . NASAL  SINUS SURGERY  2004  . RECTOCELE REPAIR  2009  . [place]   Removal of tumor  . TONSILLECTOMY AND ADENOIDECTOMY    . TOTAL KNEE ARTHROPLASTY     bilateral  . VAGINAL HYSTERECTOMY  1992    Family History  Problem Relation Age of Onset  . Breast cancer Mother   . Dementia Mother   . Breast cancer Maternal Grandmother   . Throat cancer Maternal Grandmother   . Esophageal cancer Maternal Grandmother   . Breast cancer Other        maternal great aunts x 5  . Rheum arthritis Other   . Colon cancer Neg Hx   . Diabetes Father   . Dementia Father   . Alzheimer's disease Father   . Diabetes Maternal Grandfather     Allergies  Allergen Reactions  . Atorvastatin     Body aches, worsened interstitial cystitis.  . Buprenorphine Hcl Nausea And Vomiting  . Chlorzoxazone Other (See Comments) and Nausea Only    Unknown reaction  . Gabapentin Nausea And Vomiting  .  Gemfibrozil Nausea And Vomiting  . Metronidazole Nausea And Vomiting  . Morphine And Related Nausea And Vomiting  . Nitrofurantoin Nausea And Vomiting  . Olmesartan Medoxomil-Hctz Nausea And Vomiting  . Ondansetron Nausea And Vomiting  . Pentosan Polysulfate Sodium Other (See Comments)    Unknown reaction  . Pravastatin Other (See Comments)    Body aches, worsening interstitial cystitis.  Marland Kitchen Propoxyphene N-Acetaminophen Nausea And Vomiting  . Ramipril Cough  . Sulfonamide Derivatives Nausea And Vomiting    Current Outpatient Prescriptions on File Prior to Visit  Medication Sig Dispense Refill  . ALPRAZolam (XANAX) 1 MG tablet Take 1 tablet (1 mg total) by mouth 3 (three) times daily as needed for anxiety. 30 tablet 0  . amLODipine (NORVASC) 10 MG tablet TAKE 1 TABLET BY MOUTH EVERY NIGHT AT BEDTIME 30 tablet 3  . aspirin EC 325 MG tablet Take 325 mg by mouth daily.    Marland Kitchen bismuth subsalicylate (PEPTO-BISMOL) 262 MG/15ML suspension Take 30 mLs by mouth every 6 (six) hours as needed. 360 mL 0  .  Brexpiprazole 2 MG TABS Take 1 tablet by mouth daily.    . canagliflozin (INVOKANA) 300 MG TABS tablet Take 1 tablet (300 mg total) by mouth daily before breakfast. 30 tablet 5  . desvenlafaxine (PRISTIQ) 100 MG 24 hr tablet TK 1 T PO D  0  . Diclofenac Sodium (PENNSAID) 2 % SOLN Place 1 application onto the skin at bedtime as needed (knee pain).    Marland Kitchen dicyclomine (BENTYL) 20 MG tablet TAKE 1 TABLET(20 MG) BY MOUTH EVERY 6 HOURS 30 tablet 0  . esomeprazole (NEXIUM) 40 MG capsule TAKE 1 CAPSULE BY MOUTH EVERY DAY AT NOON 90 capsule 0  . fentaNYL (DURAGESIC) 100 MCG/HR Place 1 patch (100 mcg total) onto the skin every other day. 5 patch 0  . ferrous sulfate 325 (65 FE) MG tablet Take 325 mg by mouth daily with breakfast.    . furosemide (LASIX) 20 MG tablet TAKE 1 TABLET BY MOUTH DAILY 30 tablet 3  . glucose blood (ONE TOUCH ULTRA TEST) test strip Use as instructed. Dx: 250.00 100 each 6  . lidocaine (LIDODERM) 5 % Place 2 patches onto the skin at bedtime as needed (hip pain).     Marland Kitchen losartan (COZAAR) 100 MG tablet TAKE 1 TABLET BY MOUTH DAILY 90 tablet 0  . metFORMIN (GLUCOPHAGE-XR) 500 MG 24 hr tablet TAKE 4 TABLETS BY MOUTH DAILY WITH BREAKFAST 120 tablet 5  . metoprolol succinate (TOPROL-XL) 100 MG 24 hr tablet Take 1 tablet (100 mg total) by mouth daily. Take with or immediately following a meal. 30 tablet 2  . mirabegron ER (MYRBETRIQ) 50 MG TB24 Take 50 mg by mouth at bedtime.     Marland Kitchen nystatin cream (MYCOSTATIN) Apply 1 application topically 2 (two) times daily. 30 g 2  . Omega-3 Fatty Acids (FISH OIL) 1000 MG CAPS Take 1,000 mg by mouth daily.    Glory Rosebush DELICA LANCETS 38V MISC Use to check blood sugar 1-2 times daily.  Dx E11.65 100 each 3  . Oxycodone HCl 10 MG TABS Take 10 mg by mouth every 8 (eight) hours as needed.  0  . polycarbophil (FIBERCON) 625 MG tablet Take 1 tablet (625 mg total) by mouth daily. 30 tablet 0  . potassium gluconate 595 MG TABS tablet Take 595 mg by mouth daily as  needed (leg cramps).    . promethazine (PHENERGAN) 25 MG tablet Take 25 mg by mouth daily as  needed for nausea or vomiting.     Marland Kitchen rOPINIRole (REQUIP) 4 MG tablet TAKE 1 TABLET(4 MG) BY MOUTH AT BEDTIME 90 tablet 0  . sitaGLIPtin (JANUVIA) 100 MG tablet Take 1 tablet (100 mg total) by mouth daily with breakfast. 90 tablet 0  . tiZANidine (ZANAFLEX) 4 MG tablet Take 1 tablet (4 mg total) by mouth at bedtime. 30 tablet 0  . topiramate (TOPAMAX) 50 MG tablet Take 50 mg by mouth 2 (two) times daily as needed (headaches).     . traZODone (DESYREL) 100 MG tablet Take 100 mg by mouth at bedtime. Take 1 - 2 tablets at bedtime as needed.    Marland Kitchen UNABLE TO FIND C-Pap machine , use nightly    . valACYclovir (VALTREX) 1000 MG tablet Take 1 tablet (1,000 mg total) by mouth 3 (three) times daily. 30 tablet 0  . [DISCONTINUED] pravastatin (PRAVACHOL) 20 MG tablet Take 1 tablet (20 mg total) by mouth daily. 30 tablet 3   No current facility-administered medications on file prior to visit.     BP 117/73 (BP Location: Right Arm, Patient Position: Sitting, Cuff Size: Large)   Pulse 64   Ht 5\' 5"  (1.651 m)   Wt 251 lb 9.6 oz (114.1 kg)   SpO2 97%   BMI 41.87 kg/m       Objective:   Physical Exam  Constitutional: She is oriented to person, place, and time. She appears well-developed and well-nourished.  Cardiovascular: Normal rate, regular rhythm and normal heart sounds.   No murmur heard. Pulmonary/Chest: Effort normal and breath sounds normal. No respiratory distress. She has no wheezes.  Abdominal: Soft. Bowel sounds are normal. She exhibits no distension and no mass. There is no tenderness. There is no rebound and no guarding.  Musculoskeletal: She exhibits no edema.  Neurological: She is alert and oriented to person, place, and time.  Psychiatric: She has a normal mood and affect. Her behavior is normal. Judgment and thought content normal.          Assessment & Plan:  Diarrhea- Could be related  to metformin. Advised pt as follows:  Please hold metformin tonight and tomorrow night and Wednesday.  Call or send me a mychart message on Thursday AM to let me know if your diarrhea is improved.  Will check stool studies, CBC, cmet today. Pt advised to follow up in 2 weeks, sooner if problems/concerns.

## 2017-06-13 LAB — CLOSTRIDIUM DIFFICILE BY PCR: CDIFFPCR: NOT DETECTED

## 2017-06-14 LAB — OVA AND PARASITE EXAMINATION: OP: NONE SEEN

## 2017-06-16 LAB — STOOL CULTURE

## 2017-06-19 ENCOUNTER — Telehealth: Payer: Self-pay | Admitting: *Deleted

## 2017-06-19 NOTE — Telephone Encounter (Signed)
LM for patient to return call to schedule AWV.   

## 2017-06-20 NOTE — Progress Notes (Signed)
Subjective:   [name] is a 61 y.o. female who presents for Medicare Annual (Subsequent) preventive examination.  Review of Systems:  No ROS.  Medicare Wellness Visit. Additional risk factors are reflected in the social history.  Cardiac Risk Factors include: advanced age (>53men, >74 women);diabetes mellitus;dyslipidemia;hypertension;obesity (BMI >30kg/m2);sedentary lifestyle Sleep patterns: Sleeps 8-10 hrs. Still feels tired. Home Safety/Smoke Alarms: Feels safe in home. Smoke alarms in place.  Living environment; residence and Firearm Safety: Lives on 2nd floor apt. No guns. Seat Belt Helmet: Wears seat belt.   Counseling:   Eye Exam- Wearing glasses.Dr.Forsey yearly. Dental- Dr.Wallace every 6 months.  Female:   Pap- Hysterectomy       Mammo- Last 11/03/16: BI-RADS CATEGORY  1: Negative.       Dexa scan- Last 06/13/15: Result not on file.   Will do at same time as mammogram in December.     CCS- Last 08/14/11: Normal. Recall  10 yrs.    Objective:     Vitals: BP (!) 147/83 (BP Location: Right Wrist, Patient Position: Sitting, Cuff Size: Normal)   Pulse (!) 58   Ht 5\' 5"  (1.651 m)   Wt 255 lb (115.7 kg)   SpO2 96%   BMI 42.43 kg/m   Body mass index is 42.43 kg/m.   Tobacco History  Smoking Status  . Former Smoker  . Packs/day: 0.10  . Years: 1.00  . Types: Cigarettes  . Quit date: 10/19/2012  Smokeless Tobacco  . Never Used    Comment: using electronic cigarettes     Counseling given: Not Answered   Past Medical History:  Diagnosis Date  . Anxiety   . Arthritis    in knees  . Chronic abdominal pain   . Chronic fatigue syndrome   . Depression   . Diabetes mellitus    diet controlled  . Diverticulosis   . Dyslipidemia   . Esophageal reflux   . Fibromyalgia   . Hypertension   . IBS (irritable bowel syndrome)   . IC (interstitial cystitis)   . Obesity   . Osteopenia   . Restless leg syndrome   . Sleep apnea   . TIA (transient ischemic  attack) 08/22/2012   Past Surgical History:  Procedure Laterality Date  . ABDOMINAL EXPLORATION SURGERY  1983   diverticulum removed  . APPENDECTOMY  1983   with exploration  . bladder hydrodistention  12/24/14  . BREAST CYST EXCISION  1978   left  . CERVICAL FUSION  2010  . LAPAROSCOPIC INCISIONAL / UMBILICAL / [place], 2001, 2007   x 3  . LAPAROTOMY  1982   cyst removed from right ovary and removal of adhesions  . LAPAROTOMY  1991  . NASAL SINUS SURGERY  2004  . RECTOCELE REPAIR  2009  . [place]   Removal of tumor  . TONSILLECTOMY AND ADENOIDECTOMY    . TOTAL KNEE ARTHROPLASTY     bilateral  . VAGINAL HYSTERECTOMY  1992   Family History  Problem Relation Age of Onset  . Breast cancer Mother   . Dementia Mother   . Diabetes Father   . Dementia Father   . Alzheimer's disease Father   . Breast cancer Maternal Grandmother   . Throat cancer Maternal Grandmother   . Esophageal cancer Maternal Grandmother   . Breast cancer Other        maternal great aunts x 5  . Rheum arthritis Other   .  Diabetes Maternal Grandfather   . Colon cancer Neg Hx    History  Sexual Activity  . Sexual activity: No    Outpatient Encounter Prescriptions as of 06/24/2017  Medication Sig  . ALPRAZolam (XANAX) 1 MG tablet Take 1 tablet (1 mg total) by mouth 3 (three) times daily as needed for anxiety.  Marland Kitchen amLODipine (NORVASC) 10 MG tablet TAKE 1 TABLET BY MOUTH EVERY NIGHT AT BEDTIME  . aspirin EC 325 MG tablet Take 325 mg by mouth daily.  Marland Kitchen bismuth subsalicylate (PEPTO-BISMOL) 262 MG/15ML suspension Take 30 mLs by mouth every 6 (six) hours as needed.  . Brexpiprazole 2 MG TABS Take 1 tablet by mouth daily.  . canagliflozin (INVOKANA) 300 MG TABS tablet Take 1 tablet (300 mg total) by mouth daily before breakfast.  . desvenlafaxine (PRISTIQ) 100 MG 24 hr tablet TK 1 T PO D  . Diclofenac Sodium (PENNSAID) 2 % SOLN Place 1 application onto the skin at bedtime as  needed (knee pain).  Marland Kitchen dicyclomine (BENTYL) 20 MG tablet TAKE 1 TABLET(20 MG) BY MOUTH EVERY 6 HOURS  . esomeprazole (NEXIUM) 40 MG capsule TAKE 1 CAPSULE BY MOUTH EVERY DAY AT NOON  . fentaNYL (DURAGESIC) 100 MCG/HR Place 1 patch (100 mcg total) onto the skin every other day.  . furosemide (LASIX) 20 MG tablet TAKE 1 TABLET BY MOUTH DAILY  . glucose blood (ONE TOUCH ULTRA TEST) test strip Use as instructed. Dx: 250.00  . lidocaine (LIDODERM) 5 % Place 2 patches onto the skin at bedtime as needed (hip pain).   Marland Kitchen losartan (COZAAR) 100 MG tablet TAKE 1 TABLET BY MOUTH DAILY  . metoprolol succinate (TOPROL-XL) 100 MG 24 hr tablet Take 1 tablet (100 mg total) by mouth daily. Take with or immediately following a meal.  . mirabegron ER (MYRBETRIQ) 50 MG TB24 Take 50 mg by mouth at bedtime.   Marland Kitchen nystatin cream (MYCOSTATIN) Apply 1 application topically 2 (two) times daily.  . Omega-3 Fatty Acids (FISH OIL) 1000 MG CAPS Take 1,000 mg by mouth daily.  Glory Rosebush DELICA LANCETS 32D MISC Use to check blood sugar 1-2 times daily.  Dx E11.65  . Oxycodone HCl 10 MG TABS Take 10 mg by mouth every 8 (eight) hours as needed.  . potassium gluconate 595 MG TABS tablet Take 595 mg by mouth daily as needed (leg cramps).  . promethazine (PHENERGAN) 25 MG tablet Take 25 mg by mouth daily as needed for nausea or vomiting.   Marland Kitchen rOPINIRole (REQUIP) 4 MG tablet TAKE 1 TABLET(4 MG) BY MOUTH AT BEDTIME  . sitaGLIPtin (JANUVIA) 100 MG tablet Take 1 tablet (100 mg total) by mouth daily with breakfast.  . tiZANidine (ZANAFLEX) 4 MG tablet Take 1 tablet (4 mg total) by mouth at bedtime.  . topiramate (TOPAMAX) 50 MG tablet Take 50 mg by mouth 2 (two) times daily as needed (headaches).   . traZODone (DESYREL) 100 MG tablet Take 100 mg by mouth at bedtime. Take 1 - 2 tablets at bedtime as needed.  . ferrous sulfate 325 (65 FE) MG tablet Take 325 mg by mouth daily with breakfast.  . metFORMIN (GLUCOPHAGE-XR) 500 MG 24 hr tablet  TAKE 4 TABLETS BY MOUTH DAILY WITH BREAKFAST (Patient not taking: Reported on 06/24/2017)  . polycarbophil (FIBERCON) 625 MG tablet Take 1 tablet (625 mg total) by mouth daily. (Patient not taking: Reported on 06/24/2017)  . UNABLE TO FIND C-Pap machine , use nightly  . valACYclovir (VALTREX) 1000 MG tablet Take  1 tablet (1,000 mg total) by mouth 3 (three) times daily. (Patient not taking: Reported on 06/24/2017)   No facility-administered encounter medications on file as of 06/24/2017.     Activities of Daily Living In your present state of health, do you have any difficulty performing the following activities: 06/24/2017  Hearing? N  Vision? N  Difficulty concentrating or making decisions? N  Walking or climbing stairs? N  Dressing or bathing? N  Doing errands, shopping? N  Preparing Food and eating ? N  Using the Toilet? N  In the past six months, have you accidently leaked urine? Y  Comment wears pads  Do you have problems with loss of bowel control? Y  Managing your Medications? N  Managing your Finances? N  Housekeeping or managing your Housekeeping? N  Some recent data might be hidden    Patient Care Team: [name], NP as PCP - General (Internal Medicine) Domingo Pulse, MD as Consulting Physician (Urology) Jodi Marble, MD as Consulting Physician (Otolaryngology) Arvella Nigh, MD as Consulting Physician (Obstetrics and Gynecology) Paralee Cancel, MD as Consulting Physician (Orthopedic Surgery) Rainwater, Willey Blade, PA-C as Physician Assistant (Physician Assistant) Cvejin, Sheppard Coil, MD as Referring Physician (Psychiatry)    Assessment:    Physical assessment deferred to PCP.   Exercise Activities and Dietary recommendations Current Exercise Habits: The patient does not participate in regular exercise at present, Exercise limited by: respiratory conditions(s) Diet (meal preparation, eat out, water intake, caffeinated beverages, dairy products, fruits and  vegetables): in general, an "unhealthy" diet  Goals      Patient Stated   . Lose 10lbs-goal is 245lb (pt-stated)          Exercise more- use the pool and exercise 1x per week. Eat healthier. Include more protein and vegtables. Continue drinking water. Be aware of the amount of sodium and carbohydrates that you take in.      Fall Risk Fall Risk  06/24/2017 03/13/2016 08/06/2013 08/06/2013  Falls in the past year? No No Yes Yes  Number falls in past yr: - - 1 -  Risk for fall due to : - - - History of fall(s)   Depression Screen PHQ 2/9 Scores 06/24/2017 04/08/2017 03/13/2016 08/06/2013  PHQ - 2 Score 6 6 5 6   PHQ- 9 Score 15 26 11 7      Cognitive Function Ad8 score reviewed for issues:  Issues making decisions:no  Less interest in hobbies / activities:no  Repeats questions, stories (family complaining):no  Trouble using ordinary gadgets (microwave, computer, phone):no  Forgets the month or year: no  Mismanaging finances: no  Remembering appts:no  Daily problems with thinking and/or memory:no Ad8 score is=0         Immunization History  Administered Date(s) Administered  . Influenza Split 08/03/2011, 07/29/2012  . Influenza Whole 08/19/2009  . Influenza,inj,Quad PF,36+ Mos 07/31/2013, 08/02/2014, 08/14/2016  . Influenza-Unspecified 08/30/2015  . Pneumococcal Polysaccharide-23 09/24/2012  . Tdap 09/24/2012  . Zoster 05/11/2015  . Zoster Recombinat (Shingrix) 02/18/2017, 04/29/2017   Screening Tests Health Maintenance  Topic Date Due  . HEMOGLOBIN A1C  05/23/2017  . INFLUENZA VACCINE  06/19/2017  . OPHTHALMOLOGY EXAM  06/19/2017  . PNEUMOCOCCAL POLYSACCHARIDE VACCINE (2) 09/24/2017  . FOOT EXAM  11/23/2017  . MAMMOGRAM  11/03/2018  . COLONOSCOPY  08/13/2021  . TETANUS/TDAP  09/24/2022  . Hepatitis C Screening  Completed  . HIV Screening  Completed      Plan:   Follow up with Lenna Sciara NP today as  scheduled.  Continue to eat heart healthy diet (full of  fruits, vegetables, whole grains, lean protein, water--limit salt, fat, and sugar intake) and increase physical activity as tolerated.  Continue doing brain stimulating activities (puzzles, reading, adult coloring books, staying active) to keep memory sharp.     I have personally reviewed and noted the following in the patient's chart:   . Medical and social history . Use of alcohol, tobacco or illicit drugs  . Current medications and supplements . Functional ability and status . Nutritional status . Physical activity . Advanced directives . List of other physicians . Hospitalizations, surgeries, and ER visits in previous 12 months . Vitals . Screenings to include cognitive, depression, and falls . Referrals and appointments  In addition, I have reviewed and discussed with patient certain preventive protocols, quality metrics, and best practice recommendations. A written personalized care plan for preventive services as well as general preventive health recommendations were provided to patient.     Shela Nevin, [place]  06/24/2017

## 2017-06-24 ENCOUNTER — Encounter: Payer: Self-pay | Admitting: Family

## 2017-06-24 ENCOUNTER — Ambulatory Visit (INDEPENDENT_AMBULATORY_CARE_PROVIDER_SITE_OTHER): Payer: Medicare Other | Admitting: Family

## 2017-06-24 VITALS — BP 147/83 | HR 58 | Ht 65.0 in | Wt 255.0 lb

## 2017-06-24 DIAGNOSIS — I10 Essential (primary) hypertension: Secondary | ICD-10-CM

## 2017-06-24 DIAGNOSIS — Z0000 Encounter for general adult medical examination without abnormal findings: Secondary | ICD-10-CM

## 2017-06-24 DIAGNOSIS — G4733 Obstructive sleep apnea (adult) (pediatric): Secondary | ICD-10-CM

## 2017-06-24 DIAGNOSIS — E119 Type 2 diabetes mellitus without complications: Secondary | ICD-10-CM

## 2017-06-24 MED ORDER — PIOGLITAZONE HCL 15 MG PO TABS: 15.0000 mg | ORAL_TABLET | Freq: Every day | ORAL | 2 refills | Status: DC

## 2017-06-24 MED ORDER — AMLODIPINE BESYLATE 10 MG PO TABS: 5.0000 mg | ORAL_TABLET | Freq: Every day | ORAL | 3 refills | Status: DC

## 2017-06-24 MED ORDER — HYDRALAZINE HCL 25 MG PO TABS: 25.0000 mg | ORAL_TABLET | Freq: Three times a day (TID) | ORAL | 2 refills | Status: DC

## 2017-06-24 NOTE — Progress Notes (Signed)
Subjective:    Patient ID: [name], female    DOB: 06/09/1956, 61 y.o.   MRN: [id number]  HPI  Pt presents today for follow up.   Diarrhea- reported 3 loose stools/day x 5 weeks during her last visit on 7/23. We advised her to hold metformin. Reports 2 soft stools a day, rarely watery.    OSA- previously followed by Dr. Gwenette Greet. Not using for "a long time."    DM2- maintained on januvia (metformin on hold) and invokana. Reports that she has been seeing sugars 167-203.   Lab Results  Component Value Date   HGBA1C 7.4 (H) 11/23/2016   HGBA1C 7.3 (H) 08/14/2016   HGBA1C 9.4 (H) 03/13/2016   Lab Results  Component Value Date   MICROALBUR 0.2 11/23/2016   LDLCALC 131 (H) 11/23/2016   CREATININE 0.85 06/10/2017   Edema- feels like her LE has been worse.   BP Readings from Last 3 Encounters:  06/24/17 (!) 147/83  06/10/17 117/73  04/08/17 (!) 150/78       Review of Systems See HPI  Past Medical History:  Diagnosis Date  . Anxiety   . Arthritis    in knees  . Chronic abdominal pain   . Chronic fatigue syndrome   . Depression   . Diabetes mellitus    diet controlled  . Diverticulosis   . Dyslipidemia   . Esophageal reflux   . Fibromyalgia   . Hypertension   . IBS (irritable bowel syndrome)   . IC (interstitial cystitis)   . Obesity   . Osteopenia   . Restless leg syndrome   . Sleep apnea   . TIA (transient ischemic attack) 08/22/2012     Social History   Social History  . Marital status: Divorced    Spouse name: N/A  . Number of children: 2  . Years of education: N/A   Occupational History  . disabled    Social History Main Topics  . Smoking status: Former Smoker    Packs/day: 0.10    Years: 1.00    Types: Cigarettes    Quit date: 10/19/2012  . Smokeless tobacco: Never Used     Comment: using electronic cigarettes  . Alcohol use Yes     Comment: 2 drinks per month  . Drug use: No  . Sexual activity: No   Other Topics Concern  . Not  on file   Social History Narrative   Disabled   Divorced, lives alone - 1 dtr, 1 son    Past Surgical History:  Procedure Laterality Date  . ABDOMINAL EXPLORATION SURGERY  1983   diverticulum removed  . APPENDECTOMY  1983   with exploration  . bladder hydrodistention  12/24/14  . BREAST CYST EXCISION  1978   left  . CERVICAL FUSION  2010  . LAPAROSCOPIC INCISIONAL / UMBILICAL / [place], 2001, 2007   x 3  . LAPAROTOMY  1982   cyst removed from right ovary and removal of adhesions  . LAPAROTOMY  1991  . NASAL SINUS SURGERY  2004  . RECTOCELE REPAIR  2009  . [place]   Removal of tumor  . TONSILLECTOMY AND ADENOIDECTOMY    . TOTAL KNEE ARTHROPLASTY     bilateral  . VAGINAL HYSTERECTOMY  1992    Family History  Problem Relation Age of Onset  . Breast cancer Mother   . Dementia Mother   . Diabetes Father   . Dementia  Father   . Alzheimer's disease Father   . Breast cancer Maternal Grandmother   . Throat cancer Maternal Grandmother   . Esophageal cancer Maternal Grandmother   . Breast cancer Other        maternal great aunts x 5  . Rheum arthritis Other   . Diabetes Maternal Grandfather   . Colon cancer Neg Hx     Allergies  Allergen Reactions  . Atorvastatin     Body aches, worsened interstitial cystitis.  . Buprenorphine Hcl Nausea And Vomiting  . Chlorzoxazone Other (See Comments) and Nausea Only    Unknown reaction  . Gabapentin Nausea And Vomiting  . Gemfibrozil Nausea And Vomiting  . Metronidazole Nausea And Vomiting  . Morphine And Related Nausea And Vomiting  . Nitrofurantoin Nausea And Vomiting  . Olmesartan Medoxomil-Hctz Nausea And Vomiting  . Ondansetron Nausea And Vomiting  . Pentosan Polysulfate Sodium Other (See Comments)    Unknown reaction  . Pravastatin Other (See Comments)    Body aches, worsening interstitial cystitis.  Marland Kitchen Propoxyphene N-Acetaminophen Nausea And Vomiting  . Ramipril Cough  .  Sulfonamide Derivatives Nausea And Vomiting    Current Outpatient Prescriptions on File Prior to Visit  Medication Sig Dispense Refill  . ALPRAZolam (XANAX) 1 MG tablet Take 1 tablet (1 mg total) by mouth 3 (three) times daily as needed for anxiety. 30 tablet 0  . amLODipine (NORVASC) 10 MG tablet TAKE 1 TABLET BY MOUTH EVERY NIGHT AT BEDTIME 30 tablet 3  . aspirin EC 325 MG tablet Take 325 mg by mouth daily.    Marland Kitchen bismuth subsalicylate (PEPTO-BISMOL) 262 MG/15ML suspension Take 30 mLs by mouth every 6 (six) hours as needed. 360 mL 0  . Brexpiprazole 2 MG TABS Take 1 tablet by mouth daily.    . canagliflozin (INVOKANA) 300 MG TABS tablet Take 1 tablet (300 mg total) by mouth daily before breakfast. 30 tablet 5  . desvenlafaxine (PRISTIQ) 100 MG 24 hr tablet TK 1 T PO D  0  . Diclofenac Sodium (PENNSAID) 2 % SOLN Place 1 application onto the skin at bedtime as needed (knee pain).    Marland Kitchen dicyclomine (BENTYL) 20 MG tablet TAKE 1 TABLET(20 MG) BY MOUTH EVERY 6 HOURS 30 tablet 0  . esomeprazole (NEXIUM) 40 MG capsule TAKE 1 CAPSULE BY MOUTH EVERY DAY AT NOON 90 capsule 0  . fentaNYL (DURAGESIC) 100 MCG/HR Place 1 patch (100 mcg total) onto the skin every other day. 5 patch 0  . furosemide (LASIX) 20 MG tablet TAKE 1 TABLET BY MOUTH DAILY 30 tablet 3  . glucose blood (ONE TOUCH ULTRA TEST) test strip Use as instructed. Dx: 250.00 100 each 6  . lidocaine (LIDODERM) 5 % Place 2 patches onto the skin at bedtime as needed (hip pain).     Marland Kitchen losartan (COZAAR) 100 MG tablet TAKE 1 TABLET BY MOUTH DAILY 90 tablet 0  . metoprolol succinate (TOPROL-XL) 100 MG 24 hr tablet Take 1 tablet (100 mg total) by mouth daily. Take with or immediately following a meal. 30 tablet 2  . mirabegron ER (MYRBETRIQ) 50 MG TB24 Take 50 mg by mouth at bedtime.     Marland Kitchen nystatin cream (MYCOSTATIN) Apply 1 application topically 2 (two) times daily. 30 g 2  . Omega-3 Fatty Acids (FISH OIL) 1000 MG CAPS Take 1,000 mg by mouth daily.    Glory Rosebush DELICA LANCETS 55D MISC Use to check blood sugar 1-2 times daily.  Dx E11.65 100 each  3  . Oxycodone HCl 10 MG TABS Take 10 mg by mouth every 8 (eight) hours as needed.  0  . potassium gluconate 595 MG TABS tablet Take 595 mg by mouth daily as needed (leg cramps).    . promethazine (PHENERGAN) 25 MG tablet Take 25 mg by mouth daily as needed for nausea or vomiting.     Marland Kitchen rOPINIRole (REQUIP) 4 MG tablet TAKE 1 TABLET(4 MG) BY MOUTH AT BEDTIME 90 tablet 0  . sitaGLIPtin (JANUVIA) 100 MG tablet Take 1 tablet (100 mg total) by mouth daily with breakfast. 90 tablet 0  . tiZANidine (ZANAFLEX) 4 MG tablet Take 1 tablet (4 mg total) by mouth at bedtime. 30 tablet 0  . topiramate (TOPAMAX) 50 MG tablet Take 50 mg by mouth 2 (two) times daily as needed (headaches).     . traZODone (DESYREL) 100 MG tablet Take 100 mg by mouth at bedtime. Take 1 - 2 tablets at bedtime as needed.    . ferrous sulfate 325 (65 FE) MG tablet Take 325 mg by mouth daily with breakfast.    . metFORMIN (GLUCOPHAGE-XR) 500 MG 24 hr tablet TAKE 4 TABLETS BY MOUTH DAILY WITH BREAKFAST (Patient not taking: Reported on 06/24/2017) 120 tablet 5  . polycarbophil (FIBERCON) 625 MG tablet Take 1 tablet (625 mg total) by mouth daily. (Patient not taking: Reported on 06/24/2017) 30 tablet 0  . UNABLE TO FIND C-Pap machine , use nightly    . valACYclovir (VALTREX) 1000 MG tablet Take 1 tablet (1,000 mg total) by mouth 3 (three) times daily. (Patient not taking: Reported on 06/24/2017) 30 tablet 0  . [DISCONTINUED] pravastatin (PRAVACHOL) 20 MG tablet Take 1 tablet (20 mg total) by mouth daily. 30 tablet 3   No current facility-administered medications on file prior to visit.     BP (!) 147/83 (BP Location: Right Wrist, Patient Position: Sitting, Cuff Size: Normal)   Pulse (!) 58   Ht 5\' 5"  (1.651 m)   Wt 255 lb (115.7 kg)   SpO2 96%   BMI 42.43 kg/m       Objective:   Physical Exam  Constitutional: She appears well-developed and  well-nourished.  Cardiovascular: Normal rate, regular rhythm and normal heart sounds.   No murmur heard. Pulmonary/Chest: Effort normal and breath sounds normal. No respiratory distress. She has no wheezes.  Musculoskeletal:  2+ bilateral LE edema  Psychiatric: She has a normal mood and affect. Her behavior is normal. Judgment and thought content normal.          Assessment & Plan:  DM2- uncontrolled, diarrhea better off of metformin. Stool studies were negative. Advised pt to remain off of metformin, start low dose actos, keep upcoming appointment with endo.  HTN- LE edema likely secondary to amlodipine. Decrease amlodipine from 10mg  to 5mg .  Add hydralazine tid.   OSA- will refer to sleep medicine for further evaluation and re-initiation of CPAP.

## 2017-06-24 NOTE — Patient Instructions (Addendum)
Please cut amlodipine in half and take 1/2 tab once daily. Add hydralazine 3 times daily for your blood pressure. Remain off of the metformin since your diarrhea is improved.  Instead, please add actos and keep your upcoming appointment with the diabetes doctor (Dr. Cruzita Lederer).   [name] , Thank you for taking time to come for your Medicare Wellness Visit. I appreciate your ongoing commitment to your health goals. Please review the following plan we discussed and let me know if I can assist you in the future.   These are the goals we discussed: Goals      Patient Stated   . Lose 10lbs-goal is 245lb (pt-stated)          Exercise more- use the pool and exercise 1x per week. Eat healthier. Include more protein and vegtables. Continue drinking water. Be aware of the amount of sodium and carbohydrates that you take in.       This is a list of the screening recommended for you and due dates:  Health Maintenance  Topic Date Due  . Hemoglobin A1C  05/23/2017  . Flu Shot  06/19/2017  . Eye exam for diabetics  06/19/2017  . Pneumococcal vaccine (2) 09/24/2017  . Complete foot exam   11/23/2017  . Mammogram  11/03/2018  . Colon Cancer Screening  08/13/2021  . Tetanus Vaccine  09/24/2022  .  Hepatitis C: One time screening is recommended by Center for Disease Control  (CDC) for  adults born from [birth year] through 1964.   Completed  . HIV Screening  Completed   Continue to eat heart healthy diet (full of fruits, vegetables, whole grains, lean protein, water--limit salt, fat, and sugar intake) and increase physical activity as tolerated.  Continue doing brain stimulating activities (puzzles, reading, adult coloring books, staying active) to keep memory sharp.   DASH Eating Plan DASH stands for "Dietary Approaches to Stop Hypertension." The DASH eating plan is a healthy eating plan that has been shown to reduce high blood pressure (hypertension). It may also reduce your risk for type 2  diabetes, heart disease, and stroke. The DASH eating plan may also help with weight loss. What are tips for following this plan? General guidelines  Avoid eating more than 2,300 mg (milligrams) of salt (sodium) a day. If you have hypertension, you may need to reduce your sodium intake to 1,500 mg a day.  Limit alcohol intake to no more than 1 drink a day for nonpregnant women and 2 drinks a day for men. One drink equals 12 oz of beer, 5 oz of wine, or 1 oz of hard liquor.  Work with your health care provider to maintain a healthy body weight or to lose weight. Ask what an ideal weight is for you.  Get at least 30 minutes of exercise that causes your heart to beat faster (aerobic exercise) most days of the week. Activities may include walking, swimming, or biking.  Work with your health care provider or diet and nutrition specialist (dietitian) to adjust your eating plan to your individual calorie needs. Reading food labels  Check food labels for the amount of sodium per serving. Choose foods with less than 5 percent of the Daily Value of sodium. Generally, foods with less than 300 mg of sodium per serving fit into this eating plan.  To find whole grains, look for the word "whole" as the first word in the ingredient list. Shopping  Buy products labeled as "low-sodium" or "no salt added."  Buy  fresh foods. Avoid canned foods and premade or frozen meals. Cooking  Avoid adding salt when cooking. Use salt-free seasonings or herbs instead of table salt or sea salt. Check with your health care provider or pharmacist before using salt substitutes.  Do not fry foods. Cook foods using healthy methods such as baking, boiling, grilling, and broiling instead.  Cook with heart-healthy oils, such as olive, canola, soybean, or sunflower oil. Meal planning   Eat a balanced diet that includes: ? 5 or more servings of fruits and vegetables each day. At each meal, try to fill half of your plate with  fruits and vegetables. ? Up to 6-8 servings of whole grains each day. ? Less than 6 oz of lean meat, poultry, or fish each day. A 3-oz serving of meat is about the same size as a deck of cards. One egg equals 1 oz. ? 2 servings of low-fat dairy each day. ? A serving of nuts, seeds, or beans 5 times each week. ? Heart-healthy fats. Healthy fats called Omega-3 fatty acids are found in foods such as flaxseeds and coldwater fish, like sardines, salmon, and mackerel.  Limit how much you eat of the following: ? Canned or prepackaged foods. ? Food that is high in trans fat, such as fried foods. ? Food that is high in saturated fat, such as fatty meat. ? Sweets, desserts, sugary drinks, and other foods with added sugar. ? Full-fat dairy products.  Do not salt foods before eating.  Try to eat at least 2 vegetarian meals each week.  Eat more home-cooked food and less restaurant, buffet, and fast food.  When eating at a restaurant, ask that your food be prepared with less salt or no salt, if possible. What foods are recommended? The items listed may not be a complete list. Talk with your dietitian about what dietary choices are best for you. Grains Whole-grain or whole-wheat bread. Whole-grain or whole-wheat pasta. Brown rice. [place]ena Morrow. Bulgur. Whole-grain and low-sodium cereals. Pita bread. Low-fat, low-sodium crackers. Whole-wheat flour tortillas. Vegetables Fresh or frozen vegetables (raw, steamed, roasted, or grilled). Low-sodium or reduced-sodium tomato and vegetable juice. Low-sodium or reduced-sodium tomato sauce and tomato paste. Low-sodium or reduced-sodium canned vegetables. Fruits All fresh, dried, or frozen fruit. Canned fruit in natural juice (without added sugar). Meat and other protein foods Skinless chicken or [place]. Ground chicken or [place]. Pork with fat trimmed off. Fish and seafood. Egg whites. Dried beans, peas, or lentils. Unsalted nuts, nut butters, and seeds.  Unsalted canned beans. Lean cuts of beef with fat trimmed off. Low-sodium, lean deli meat. Dairy Low-fat (1%) or fat-free (skim) milk. Fat-free, low-fat, or reduced-fat cheeses. Nonfat, low-sodium ricotta or cottage cheese. Low-fat or nonfat yogurt. Low-fat, low-sodium cheese. Fats and oils Soft margarine without trans fats. Vegetable oil. Low-fat, reduced-fat, or light mayonnaise and salad dressings (reduced-sodium). Canola, safflower, olive, soybean, and sunflower oils. Avocado. Seasoning and other foods Herbs. Spices. Seasoning mixes without salt. Unsalted popcorn and pretzels. Fat-free sweets. What foods are not recommended? The items listed may not be a complete list. Talk with your dietitian about what dietary choices are best for you. Grains Baked goods made with fat, such as croissants, muffins, or some breads. Dry pasta or rice meal packs. Vegetables Creamed or fried vegetables. Vegetables in a cheese sauce. Regular canned vegetables (not low-sodium or reduced-sodium). Regular canned tomato sauce and paste (not low-sodium or reduced-sodium). Regular tomato and vegetable juice (not low-sodium or reduced-sodium). Angie Fava. Olives. Fruits Canned fruit in a  light or heavy syrup. Fried fruit. Fruit in cream or butter sauce. Meat and other protein foods Fatty cuts of meat. Ribs. Fried meat. Berniece Salines. Sausage. Bologna and other processed lunch meats. Salami. Fatback. Hotdogs. Bratwurst. Salted nuts and seeds. Canned beans with added salt. Canned or smoked fish. Whole eggs or egg yolks. Chicken or [place] with skin. Dairy Whole or 2% milk, cream, and half-and-half. Whole or full-fat cream cheese. Whole-fat or sweetened yogurt. Full-fat cheese. Nondairy creamers. Whipped toppings. Processed cheese and cheese spreads. Fats and oils Butter. Stick margarine. Lard. Shortening. Ghee. Bacon fat. Tropical oils, such as coconut, palm kernel, or palm oil. Seasoning and other foods Salted popcorn and  pretzels. Onion salt, garlic salt, seasoned salt, table salt, and sea salt. Worcestershire sauce. Tartar sauce. Barbecue sauce. Teriyaki sauce. Soy sauce, including reduced-sodium. Steak sauce. Canned and packaged gravies. Fish sauce. Oyster sauce. Cocktail sauce. Horseradish that you find on the shelf. Ketchup. Mustard. Meat flavorings and tenderizers. Bouillon cubes. Hot sauce and Tabasco sauce. Premade or packaged marinades. Premade or packaged taco seasonings. Relishes. Regular salad dressings. Where to find more information:  National Heart, Lung, and [place]: https://wilson-eaton.com/  American Heart Association: www.heart.org Summary  The DASH eating plan is a healthy eating plan that has been shown to reduce high blood pressure (hypertension). It may also reduce your risk for type 2 diabetes, heart disease, and stroke.  With the DASH eating plan, you should limit salt (sodium) intake to 2,300 mg a day. If you have hypertension, you may need to reduce your sodium intake to 1,500 mg a day.  When on the DASH eating plan, aim to eat more fresh fruits and vegetables, whole grains, lean proteins, low-fat dairy, and heart-healthy fats.  Work with your health care provider or diet and nutrition specialist (dietitian) to adjust your eating plan to your individual calorie needs. This information is not intended to replace advice given to you by your health care provider. Make sure you discuss any questions you have with your health care provider. Document Released: 10/25/2011 Document Revised: 10/29/2016 Document Reviewed: 10/29/2016 Elsevier Interactive Patient Education  2017 [employer].

## 2017-06-25 NOTE — Progress Notes (Signed)
RN note reviewed and I agree.

## 2017-07-05 ENCOUNTER — Other Ambulatory Visit: Payer: Self-pay | Admitting: Family

## 2017-07-11 ENCOUNTER — Other Ambulatory Visit: Payer: Self-pay | Admitting: Family

## 2017-07-15 ENCOUNTER — Encounter: Payer: Self-pay | Admitting: Family

## 2017-07-15 ENCOUNTER — Ambulatory Visit (INDEPENDENT_AMBULATORY_CARE_PROVIDER_SITE_OTHER): Payer: Medicare Other | Admitting: Family

## 2017-07-15 VITALS — BP 129/74 | HR 64 | Temp 98.2°F | Resp 18 | Ht 65.0 in | Wt 254.2 lb

## 2017-07-15 DIAGNOSIS — I10 Essential (primary) hypertension: Secondary | ICD-10-CM | POA: Diagnosis not present

## 2017-07-15 DIAGNOSIS — E119 Type 2 diabetes mellitus without complications: Secondary | ICD-10-CM | POA: Diagnosis not present

## 2017-07-15 DIAGNOSIS — E785 Hyperlipidemia, unspecified: Secondary | ICD-10-CM

## 2017-07-15 LAB — HEMOGLOBIN A1C: Hgb A1c MFr Bld: 7.8 % — ABNORMAL HIGH (ref 4.6–6.5)

## 2017-07-15 LAB — BASIC METABOLIC PANEL
BUN: 21 mg/dL (ref 6–23)
CO2: 36 meq/L — AB (ref 19–32)
CREATININE: 0.94 mg/dL (ref 0.40–1.20)
Calcium: 9.2 mg/dL (ref 8.4–10.5)
Chloride: 98 mEq/L (ref 96–112)
GFR: 64.56 mL/min (ref >60.00)
GLUCOSE: 176 mg/dL — AB (ref 70–99)
Potassium: 4.6 mEq/L (ref 3.5–5.1)
Sodium: 135 mEq/L (ref 135–145)

## 2017-07-15 NOTE — Progress Notes (Signed)
Subjective:    Patient ID: [name], female    DOB: 1956/07/21, 61 y.o.   MRN: [id number]  HPI  Mr. [name] is a 61 year old female who presents today for routine follow-up.  Diarrhea-reports diarrhea today. Worried about her mom and attributes the diarrhea to nerves.  Otherwise she has not had significant diarrhea since last visit.   DM2- She is not currently taking metformin due to diarrhea. Reports sugars have been running a bit high. (high 100's/low 200's).   Lab Results  Component Value Date   HGBA1C 7.4 (H) 11/23/2016   HGBA1C 7.3 (H) 08/14/2016   HGBA1C 9.4 (H) 03/13/2016   Lab Results  Component Value Date   MICROALBUR 0.2 11/23/2016   LDLCALC 131 (H) 11/23/2016   CREATININE 0.85 06/10/2017   HTN- reports improvement in swelling.   BP Readings from Last 3 Encounters:  07/15/17 129/74  06/24/17 (!) 147/83  06/10/17 117/73   Hyperlipidemia- intolerant to statins.      Review of Systems See HPI  Past Medical History:  Diagnosis Date  . Anxiety   . Arthritis    in knees  . Chronic abdominal pain   . Chronic fatigue syndrome   . Depression   . Diabetes mellitus    diet controlled  . Diverticulosis   . Dyslipidemia   . Esophageal reflux   . Fibromyalgia   . Hypertension   . IBS (irritable bowel syndrome)   . IC (interstitial cystitis)   . Obesity   . Osteopenia   . Restless leg syndrome   . Sleep apnea   . TIA (transient ischemic attack) 08/22/2012     Social History   Social History  . Marital status: Divorced    Spouse name: N/A  . Number of children: 2  . Years of education: N/A   Occupational History  . disabled    Social History Main Topics  . Smoking status: Former Smoker    Packs/day: 0.10    Years: 1.00    Types: Cigarettes    Quit date: 10/19/2012  . Smokeless tobacco: Never Used     Comment: using electronic cigarettes  . Alcohol use Yes     Comment: 2 drinks per month  . Drug use: No  . Sexual activity: No   Other  Topics Concern  . Not on file   Social History Narrative   Disabled   Divorced, lives alone - 1 dtr, 1 son    Past Surgical History:  Procedure Laterality Date  . ABDOMINAL EXPLORATION SURGERY  1983   diverticulum removed  . APPENDECTOMY  1983   with exploration  . bladder hydrodistention  12/24/14  . BREAST CYST EXCISION  1978   left  . CERVICAL FUSION  2010  . LAPAROSCOPIC INCISIONAL / UMBILICAL / [name], 2001, 2007   x 3  . LAPAROTOMY  1982   cyst removed from right ovary and removal of adhesions  . LAPAROTOMY  1991  . NASAL SINUS SURGERY  2004  . RECTOCELE REPAIR  2009  . [name]   Removal of tumor  . TONSILLECTOMY AND ADENOIDECTOMY    . TOTAL KNEE ARTHROPLASTY     bilateral  . VAGINAL HYSTERECTOMY  1992    Family History  Problem Relation Age of Onset  . Breast cancer Mother   . Dementia Mother   . Diabetes Father   . Dementia Father   . Alzheimer's disease Father   .  Breast cancer Maternal Grandmother   . Throat cancer Maternal Grandmother   . Esophageal cancer Maternal Grandmother   . Breast cancer Other        maternal great aunts x 5  . Rheum arthritis Other   . Diabetes Maternal Grandfather   . Colon cancer Neg Hx     Allergies  Allergen Reactions  . Atorvastatin     Body aches, worsened interstitial cystitis.  . Buprenorphine Hcl Nausea And Vomiting  . Chlorzoxazone Other (See Comments) and Nausea Only    Unknown reaction  . Gabapentin Nausea And Vomiting  . Gemfibrozil Nausea And Vomiting  . Metronidazole Nausea And Vomiting  . Morphine And Related Nausea And Vomiting  . Nitrofurantoin Nausea And Vomiting  . Olmesartan Medoxomil-Hctz Nausea And Vomiting  . Ondansetron Nausea And Vomiting  . Pentosan Polysulfate Sodium Other (See Comments)    Unknown reaction  . Pravastatin Other (See Comments)    Body aches, worsening interstitial cystitis.  Marland Kitchen Propoxyphene N-Acetaminophen Nausea And Vomiting  .  Ramipril Cough  . Sulfonamide Derivatives Nausea And Vomiting    Current Outpatient Prescriptions on File Prior to Visit  Medication Sig Dispense Refill  . ALPRAZolam (XANAX) 1 MG tablet Take 1 tablet (1 mg total) by mouth 3 (three) times daily as needed for anxiety. 30 tablet 0  . amLODipine (NORVASC) 10 MG tablet Take 0.5 tablets (5 mg total) by mouth at bedtime. 30 tablet 3  . aspirin EC 325 MG tablet Take 325 mg by mouth daily.    Marland Kitchen bismuth subsalicylate (PEPTO-BISMOL) 262 MG/15ML suspension Take 30 mLs by mouth every 6 (six) hours as needed. 360 mL 0  . Brexpiprazole 2 MG TABS Take 1 tablet by mouth daily.    . canagliflozin (INVOKANA) 300 MG TABS tablet Take 1 tablet (300 mg total) by mouth daily before breakfast. 30 tablet 5  . desvenlafaxine (PRISTIQ) 100 MG 24 hr tablet TK 1 T PO D  0  . Diclofenac Sodium (PENNSAID) 2 % SOLN Place 1 application onto the skin at bedtime as needed (knee pain).    Marland Kitchen dicyclomine (BENTYL) 20 MG tablet TAKE 1 TABLET(20 MG) BY MOUTH EVERY 6 HOURS 30 tablet 0  . esomeprazole (NEXIUM) 40 MG capsule TAKE 1 CAPSULE BY MOUTH EVERY DAY AT NOON 90 capsule 0  . fentaNYL (DURAGESIC) 100 MCG/HR Place 1 patch (100 mcg total) onto the skin every other day. (Patient taking differently: Place 75 mcg onto the skin every other day. ) 5 patch 0  . ferrous sulfate 325 (65 FE) MG tablet Take 325 mg by mouth daily with breakfast.    . furosemide (LASIX) 20 MG tablet TAKE 1 TABLET BY MOUTH DAILY 30 tablet 3  . glucose blood (ONE TOUCH ULTRA TEST) test strip Use as instructed. Dx: 250.00 100 each 6  . hydrALAZINE (APRESOLINE) 25 MG tablet Take 1 tablet (25 mg total) by mouth 3 (three) times daily. 90 tablet 2  . lidocaine (LIDODERM) 5 % Place 2 patches onto the skin at bedtime as needed (hip pain).     Marland Kitchen losartan (COZAAR) 100 MG tablet TAKE 1 TABLET BY MOUTH DAILY 90 tablet 0  . metoprolol succinate (TOPROL-XL) 100 MG 24 hr tablet TAKE 1 TABLET BY MOUTH DAILY. TAKE WITH OR  IMMEDIATELY FOLLOWING A MEAL 30 tablet 0  . mirabegron ER (MYRBETRIQ) 50 MG TB24 Take 50 mg by mouth at bedtime.     Marland Kitchen nystatin cream (MYCOSTATIN) Apply 1 application topically 2 (two) times  daily. 30 g 2  . Omega-3 Fatty Acids (FISH OIL) 1000 MG CAPS Take 1,000 mg by mouth daily.    Glory Rosebush DELICA LANCETS 58N MISC Use to check blood sugar 1-2 times daily.  Dx E11.65 100 each 3  . Oxycodone HCl 10 MG TABS Take 10 mg by mouth every 8 (eight) hours as needed.  0  . pioglitazone (ACTOS) 15 MG tablet Take 1 tablet (15 mg total) by mouth daily. 30 tablet 2  . polycarbophil (FIBERCON) 625 MG tablet Take 1 tablet (625 mg total) by mouth daily. 30 tablet 0  . potassium gluconate 595 MG TABS tablet Take 595 mg by mouth daily as needed (leg cramps).    . promethazine (PHENERGAN) 25 MG tablet Take 25 mg by mouth daily as needed for nausea or vomiting.     Marland Kitchen rOPINIRole (REQUIP) 4 MG tablet TAKE 1 TABLET(4 MG) BY MOUTH AT BEDTIME 90 tablet 0  . sitaGLIPtin (JANUVIA) 100 MG tablet Take 1 tablet (100 mg total) by mouth daily with breakfast. 90 tablet 0  . tiZANidine (ZANAFLEX) 4 MG tablet Take 1 tablet (4 mg total) by mouth at bedtime. 30 tablet 0  . topiramate (TOPAMAX) 50 MG tablet Take 50 mg by mouth 2 (two) times daily as needed (headaches).     . traZODone (DESYREL) 100 MG tablet Take 2-3 tablets at bedtime as needed.    Marland Kitchen UNABLE TO FIND C-Pap machine , use nightly    . valACYclovir (VALTREX) 1000 MG tablet Take 1 tablet (1,000 mg total) by mouth 3 (three) times daily. 30 tablet 0  . [DISCONTINUED] pravastatin (PRAVACHOL) 20 MG tablet Take 1 tablet (20 mg total) by mouth daily. 30 tablet 3   No current facility-administered medications on file prior to visit.     BP 129/74 (BP Location: Right Arm, Cuff Size: Large)   Pulse 64   Temp 98.2 F (36.8 C) (Oral)   Resp 18   Ht 5\' 5"  (1.651 m)   Wt 254 lb 3.2 oz (115.3 kg)   SpO2 95%   BMI 42.30 kg/m       Objective:   Physical Exam    Constitutional: She is oriented to person, place, and time. She appears well-developed and well-nourished.  Cardiovascular: Normal rate, regular rhythm and normal heart sounds.   No murmur heard. Pulmonary/Chest: Effort normal and breath sounds normal. No respiratory distress. She has no wheezes.  Musculoskeletal: She exhibits no edema.  Neurological: She is alert and oriented to person, place, and time.  Psychiatric: She has a normal mood and affect. Her behavior is normal. Judgment and thought content normal.          Assessment & Plan:  HTN-  BP stable/improved on current medications.  Continue same.  DM2- check A1C, she has apt on Friday with Endo and advised her to keep this appointment.  Hyperlipidemia- I sent a message to our lipid clinic to see if her hx of TIA could qualify her for a PSK9.  Discussed low cholesterol diet.

## 2017-07-15 NOTE — Addendum Note (Signed)
Addended by: Harl Bowie on: 07/15/2017 01:40 PM   Modules accepted: Orders

## 2017-07-15 NOTE — Patient Instructions (Signed)
Please complete lab work prior to leaving.   

## 2017-07-16 ENCOUNTER — Telehealth: Payer: Self-pay | Admitting: Family

## 2017-07-16 DIAGNOSIS — E785 Hyperlipidemia, unspecified: Secondary | ICD-10-CM

## 2017-07-16 NOTE — Telephone Encounter (Signed)
See mychart.  

## 2017-07-17 ENCOUNTER — Other Ambulatory Visit: Payer: Self-pay | Admitting: Family

## 2017-07-19 ENCOUNTER — Encounter: Payer: Self-pay | Admitting: Internal Medicine

## 2017-07-19 ENCOUNTER — Ambulatory Visit (INDEPENDENT_AMBULATORY_CARE_PROVIDER_SITE_OTHER): Payer: Medicare Other | Admitting: Internal Medicine

## 2017-07-19 VITALS — BP 133/73 | HR 66 | Resp 16 | Wt 253.6 lb

## 2017-07-19 DIAGNOSIS — E1165 Type 2 diabetes mellitus with hyperglycemia: Secondary | ICD-10-CM | POA: Diagnosis not present

## 2017-07-19 DIAGNOSIS — E042 Nontoxic multinodular goiter: Secondary | ICD-10-CM

## 2017-07-19 DIAGNOSIS — IMO0002 Reserved for concepts with insufficient information to code with codable children: Secondary | ICD-10-CM

## 2017-07-19 DIAGNOSIS — E1159 Type 2 diabetes mellitus with other circulatory complications: Secondary | ICD-10-CM | POA: Diagnosis not present

## 2017-07-19 MED ORDER — DULAGLUTIDE 0.75 MG/0.5ML ~~LOC~~ SOAJ: SUBCUTANEOUS | 3 refills | Status: DC

## 2017-07-19 NOTE — Patient Instructions (Addendum)
Please continue: - Metformin 2000 mg with dinner. - Invokana 300 mg  before b'fast. - Actos 15 mg before b'fast  Please start Trulicity 0.01 mg weekly. Let me know when you are close to running out to call in the higher dose to your pharmacy (1.5 mg).  Before the second injection, stop Januvia.  Please schedule a new thyroid U/S.  Please return in  3 months with your sugar log.

## 2017-07-19 NOTE — Progress Notes (Signed)
Patient ID: [name], female   DOB: Sep 03, 1956, 61 y.o.   MRN: [id number]   HPI: [name] is a 61 y.o.-year-old female, returning for follow-up for DM2, dx in ~2012, non-insulin-dependent, uncontrolled, with complications (cerebro-vascular ds - h/o TIA 2013). Last visit 6 months ago.  Last hemoglobin A1c was: Lab Results  Component Value Date   HGBA1C 7.8 (H) 07/15/2017   HGBA1C 7.4 (H) 11/23/2016   HGBA1C 7.3 (H) 08/14/2016   Pt Was on a regimen of: - Metformin ER 2000 mg with breakfast - Januvia 100 mg at bedtime (!) - Invokana 300 mg in am before or with b'fast  At last visit we changed to: - Januvia 100 mg before b'fast. - Invokana 300 mg  before b'fast. PCP added: - Actos 15 mg daily She stopped Metformin 2000 mg with dinner >> but on hold 2/2 diarrhea  Pt Checks sugars once a day: - am: n/c  - 2h after b'fast: n/c - before lunch: n/c - 2h after lunch: n/c >> 219, 261 - before dinner: n/c >> 174 - 2h after dinner: n/c >> 173-215, 236 - bedtime: n/c - nighttime: n/c No lows. Lowest sugar was 120 >> 159; ? hypoglycemia awareness. Highest sugar was 180s >> 236.  Glucometer: One Touch ultra  Pt's meals are: - Breakfast 7-10 am: raisin toast x 2 slices; iced coffee - Lunch: skips lunch - Dinner: meat + veggies; BBQ tray; hamburger + fries - usually eats out.  May have iced tea or Dr Malachi Bonds - regular (!); or cereals. - Snacks: icecream  - No CKD, last BUN/creatinine:  Lab Results  Component Value Date   BUN 21 07/15/2017   BUN 16 06/10/2017   CREATININE 0.94 07/15/2017   CREATININE 0.85 06/10/2017  On Losartan 100 mg daily. - last set of lipids: Lab Results  Component Value Date   CHOL 214 (H) 11/23/2016   HDL 61.70 11/23/2016   LDLCALC 131 (H) 11/23/2016   LDLDIRECT 136.5 10/11/2009   TRIG 108.0 11/23/2016   CHOLHDL 3 11/23/2016  On Omega-3 fatty acids. - last eye exam was in 2017 >> no DR - Denies numbness and tingling in her feet.  She  has OSA >> does not wear her C-pap as her mask is not fitting well >> Causes anxiety  She also has a h/o Multinodular goiter, and had a benign biopsy in 2011.   She has a history of interstitial cystitis - had bladder hydrodistension with Botox, IBS with diarrhea, restless leg syndrome, anxiety/depression, fibromyalgia, pelvic floor dysfunction.  ROS: Constitutional: no weight gain/no weight loss, no fatigue, no subjective hyperthermia, no subjective hypothermia Eyes: no blurry vision, no xerophthalmia ENT: no sore throat, no nodules palpated in throat, no dysphagia, no odynophagia, no hoarseness Cardiovascular: no CP/no SOB/no palpitations/+ leg swelling Respiratory: no cough/no SOB/no wheezing Gastrointestinal: no N/no V/+ D/no C/no acid reflux Musculoskeletal: no muscle aches/no joint aches Skin: no rashes, no hair loss Neurological: no tremors/no numbness/no tingling/no dizziness  I reviewed pt's medications, allergies, PMH, social hx, family hx, and changes were documented in the history of present illness. Otherwise, unchanged from my initial visit note. Added Hydralazine, Toprol XL. Decreased Amlodipine 2/2 LE edema. Also decreased Fentanyl.   Past Medical History:  Diagnosis Date  . Anxiety   . Arthritis    in knees  . Chronic abdominal pain   . Chronic fatigue syndrome   . Depression   . Diabetes mellitus    diet controlled  . Diverticulosis   .  Dyslipidemia   . Esophageal reflux   . Fibromyalgia   . Hypertension   . IBS (irritable bowel syndrome)   . IC (interstitial cystitis)   . Obesity   . Osteopenia   . Restless leg syndrome   . Sleep apnea   . TIA (transient ischemic attack) 08/22/2012   Past Surgical History:  Procedure Laterality Date  . ABDOMINAL EXPLORATION SURGERY  1983   diverticulum removed  . APPENDECTOMY  1983   with exploration  . bladder hydrodistention  12/24/14  . BREAST CYST EXCISION  1978   left  . CERVICAL FUSION  2010  . LAPAROSCOPIC  INCISIONAL / UMBILICAL / [place], 2001, 2007   x 3  . LAPAROTOMY  1982   cyst removed from right ovary and removal of adhesions  . LAPAROTOMY  1991  . NASAL SINUS SURGERY  2004  . RECTOCELE REPAIR  2009  . [place]   Removal of tumor  . TONSILLECTOMY AND ADENOIDECTOMY    . TOTAL KNEE ARTHROPLASTY     bilateral  . VAGINAL HYSTERECTOMY  1992   Social History   Social History  . Marital status: Divorced    Spouse name: N/A  . Number of children: 2   Occupational History  . disabled    Social History Main Topics  . Smoking status: Former Smoker    Packs/day: 0.10    Years: 1.00    Types: Cigarettes    Quit date: 10/19/2012  . Smokeless tobacco: Never Used     Comment: using electronic cigarettes  . Alcohol use Yes     Comment: 2 drinks per month - liquor  . Drug use: No   Social History Narrative   Disabled   Divorced, lives alone - 1 dtr, 1 son   Current Outpatient Prescriptions on File Prior to Visit  Medication Sig Dispense Refill  . ALPRAZolam (XANAX) 1 MG tablet Take 1 tablet (1 mg total) by mouth 3 (three) times daily as needed for anxiety. 30 tablet 0  . amLODipine (NORVASC) 10 MG tablet Take 0.5 tablets (5 mg total) by mouth at bedtime. 30 tablet 3  . aspirin EC 325 MG tablet Take 325 mg by mouth daily.    Marland Kitchen bismuth subsalicylate (PEPTO-BISMOL) 262 MG/15ML suspension Take 30 mLs by mouth every 6 (six) hours as needed. 360 mL 0  . Brexpiprazole 2 MG TABS Take 1 tablet by mouth daily.    . canagliflozin (INVOKANA) 300 MG TABS tablet Take 1 tablet (300 mg total) by mouth daily before breakfast. 30 tablet 5  . desvenlafaxine (PRISTIQ) 100 MG 24 hr tablet TK 1 T PO D  0  . Diclofenac Sodium (PENNSAID) 2 % SOLN Place 1 application onto the skin at bedtime as needed (knee pain).    Marland Kitchen dicyclomine (BENTYL) 20 MG tablet TAKE 1 TABLET(20 MG) BY MOUTH EVERY 6 HOURS 30 tablet 0  . esomeprazole (NEXIUM) 40 MG capsule TAKE 1 CAPSULE BY MOUTH  EVERY DAY AT NOON 90 capsule 0  . fentaNYL (DURAGESIC) 100 MCG/HR Place 1 patch (100 mcg total) onto the skin every other day. (Patient taking differently: Place 75 mcg onto the skin every other day. ) 5 patch 0  . ferrous sulfate 325 (65 FE) MG tablet Take 325 mg by mouth daily with breakfast.    . furosemide (LASIX) 20 MG tablet TAKE 1 TABLET BY MOUTH DAILY 30 tablet 3  . glucose blood (ONE TOUCH ULTRA TEST)  test strip Use as instructed. Dx: 250.00 100 each 6  . hydrALAZINE (APRESOLINE) 25 MG tablet Take 1 tablet (25 mg total) by mouth 3 (three) times daily. 90 tablet 2  . lidocaine (LIDODERM) 5 % Place 2 patches onto the skin at bedtime as needed (hip pain).     Marland Kitchen losartan (COZAAR) 100 MG tablet TAKE 1 TABLET BY MOUTH DAILY 90 tablet 0  . metoprolol succinate (TOPROL-XL) 100 MG 24 hr tablet TAKE 1 TABLET BY MOUTH DAILY. TAKE WITH OR IMMEDIATELY FOLLOWING A MEAL 30 tablet 0  . mirabegron ER (MYRBETRIQ) 50 MG TB24 Take 50 mg by mouth at bedtime.     Marland Kitchen nystatin cream (MYCOSTATIN) Apply 1 application topically 2 (two) times daily. 30 g 2  . Omega-3 Fatty Acids (FISH OIL) 1000 MG CAPS Take 1,000 mg by mouth daily.    Glory Rosebush DELICA LANCETS 63K MISC Use to check blood sugar 1-2 times daily.  Dx E11.65 100 each 3  . Oxycodone HCl 10 MG TABS Take 10 mg by mouth every 8 (eight) hours as needed.  0  . pioglitazone (ACTOS) 15 MG tablet Take 1 tablet (15 mg total) by mouth daily. 30 tablet 2  . polycarbophil (FIBERCON) 625 MG tablet Take 1 tablet (625 mg total) by mouth daily. 30 tablet 0  . potassium gluconate 595 MG TABS tablet Take 595 mg by mouth daily as needed (leg cramps).    . promethazine (PHENERGAN) 25 MG tablet Take 25 mg by mouth daily as needed for nausea or vomiting.     Marland Kitchen rOPINIRole (REQUIP) 4 MG tablet TAKE 1 TABLET(4 MG) BY MOUTH AT BEDTIME 90 tablet 0  . sitaGLIPtin (JANUVIA) 100 MG tablet Take 1 tablet (100 mg total) by mouth daily with breakfast. 90 tablet 0  . tiZANidine (ZANAFLEX)  4 MG tablet Take 1 tablet (4 mg total) by mouth at bedtime. 30 tablet 0  . topiramate (TOPAMAX) 50 MG tablet Take 50 mg by mouth 2 (two) times daily as needed (headaches).     . traZODone (DESYREL) 100 MG tablet Take 2-3 tablets at bedtime as needed.    Marland Kitchen UNABLE TO FIND C-Pap machine , use nightly    . valACYclovir (VALTREX) 1000 MG tablet Take 1 tablet (1,000 mg total) by mouth 3 (three) times daily. 30 tablet 0  . [DISCONTINUED] pravastatin (PRAVACHOL) 20 MG tablet Take 1 tablet (20 mg total) by mouth daily. 30 tablet 3   No current facility-administered medications on file prior to visit.    Allergies  Allergen Reactions  . Atorvastatin     Body aches, worsened interstitial cystitis.  . Buprenorphine Hcl Nausea And Vomiting  . Chlorzoxazone Other (See Comments) and Nausea Only    Unknown reaction  . Gabapentin Nausea And Vomiting  . Gemfibrozil Nausea And Vomiting  . Metronidazole Nausea And Vomiting  . Morphine And Related Nausea And Vomiting  . Nitrofurantoin Nausea And Vomiting  . Olmesartan Medoxomil-Hctz Nausea And Vomiting  . Ondansetron Nausea And Vomiting  . Pentosan Polysulfate Sodium Other (See Comments)    Unknown reaction  . Pravastatin Other (See Comments)    Body aches, worsening interstitial cystitis.  Marland Kitchen Propoxyphene N-Acetaminophen Nausea And Vomiting  . Ramipril Cough  . Sulfonamide Derivatives Nausea And Vomiting   Family History  Problem Relation Age of Onset  . Breast cancer Mother   . Dementia Mother   . Diabetes Father   . Dementia Father   . Alzheimer's disease Father   .  Breast cancer Maternal Grandmother   . Throat cancer Maternal Grandmother   . Esophageal cancer Maternal Grandmother   . Breast cancer Other        maternal great aunts x 5  . Rheum arthritis Other   . Diabetes Maternal Grandfather   . Colon cancer Neg Hx    PE: BP 133/73   Pulse 66   Resp 16   Wt 253 lb 9.6 oz (115 kg)   SpO2 98%   BMI 42.20 kg/m  Wt Readings from  Last 3 Encounters:  07/19/17 253 lb 9.6 oz (115 kg)  07/15/17 254 lb 3.2 oz (115.3 kg)  06/24/17 255 lb (115.7 kg)   Constitutional: overweight, in NAD Eyes: PERRLA, EOMI, no exophthalmos ENT: moist mucous membranes, + lumpy-bumpy thyroid, no cervical lymphadenopathy Cardiovascular: RRR, No MRG Respiratory: CTA B Gastrointestinal: abdomen soft, NT, ND, BS+ Musculoskeletal: no deformities, strength intact in all 4 Skin: moist, warm, no rashes Neurological: no tremor with outstretched hands, DTR normal in all 4  ASSESSMENT: 1. DM2, non-insulin-dependent, uncontrolled, with complications - Cerebrovascular disease- h/o TIA in 2013  2. Thyroid nodules  PLAN:  1. Patient with long-standing, uncontrolled DM, with worse control at this visit as she had to stop Metformin 2/2 diarrhea. She still has diarrhea even after sh stopped metformin...  She was started on Actos 15 mg by PCP. However, sugars are higher >> will try to start a GLP1 R agonist: I suggested Trulicity >> demontrated pen use and discussed correct inj. Techniques. She is comfortable with administering the injections. Will stop Januvia as we start Trulicity. - will continue Invokana and Actos for now - We discussed about the [place] study that showed an increased risk of amputations with Invokana. I pointed out the fact that pts that were more likely to require amputations were: men, with a HbA1c >8%, with previous amputations, with PN or PVD. She agrees to continue this. - I suggested to:  Patient Instructions  Please continue: - Metformin 2000 mg with dinner. - Invokana 300 mg  before b'fast. - Actos 15 mg before b'fast  Please start Trulicity 6.38 mg weekly. Let me know when you are close to running out to call in the higher dose to your pharmacy (1.5 mg).  Before the second injection, stop Januvia.  Please schedule a new thyroid U/S.  Please return in  3 months with your sugar log.    - today, HbA1c is 7.8% (higher) -  continue checking sugars at different times of the day - check 1-2x a day, rotating checks - advised for yearly eye exams >> she is UTD - Return to clinic in 3 mo with sugar log    2. Thyroid nodules - I reviewed her previous thyroid ultrasound from 2011 and this showed small nodules. One of them was biopsied and the biopsy was benign.  - no neck compression sxs - will check a new U/S now - Reviewed her most recent TFTs, normal 04/2015  Philemon Kingdom, MD PhD [facility] Endocrinology

## 2017-07-25 ENCOUNTER — Telehealth: Payer: Self-pay | Admitting: Internal Medicine

## 2017-07-25 NOTE — Telephone Encounter (Signed)
Patient called in reference to injecting Dulaglutide (TRULICITY) 2.80 KL/4.9ZP SOPN into stomach and said there is a bruise the size of a "grapefruit" there now. Patient would like to know if this is normal and would like to speak with someone about this. Please call patient and advise. OK to leave message.

## 2017-07-29 ENCOUNTER — Telehealth: Payer: Self-pay

## 2017-07-29 NOTE — Telephone Encounter (Signed)
Please advise if this is okay or to switch areas? Thank you!

## 2017-07-29 NOTE — Telephone Encounter (Signed)
Called and notified of MD note, advised to call back if it happens again. Left call back number.

## 2017-07-29 NOTE — Telephone Encounter (Signed)
She likely injected in a blood vessel. I would try the next inj on the other side of the stomach.

## 2017-07-30 ENCOUNTER — Telehealth: Payer: Self-pay | Admitting: Family

## 2017-07-30 DIAGNOSIS — E785 Hyperlipidemia, unspecified: Secondary | ICD-10-CM

## 2017-07-30 NOTE — Telephone Encounter (Signed)
Attempted to reach pt; no answer, no voicemail. Will try again tomorrow.

## 2017-07-30 NOTE — Telephone Encounter (Signed)
Please contact pt re: unread mychart message. (I will refer to lipid clinic to see if she will qualify for new cholesterol medication since she can't take statins).

## 2017-07-31 NOTE — Telephone Encounter (Signed)
Notified pt and she is agreeable to proceed with referral. 

## 2017-08-05 ENCOUNTER — Other Ambulatory Visit: Payer: Self-pay

## 2017-08-05 ENCOUNTER — Telehealth: Payer: Self-pay | Admitting: Internal Medicine

## 2017-08-05 MED ORDER — DULAGLUTIDE 0.75 MG/0.5ML ~~LOC~~ SOAJ: SUBCUTANEOUS | 3 refills | Status: DC

## 2017-08-05 NOTE — Telephone Encounter (Signed)
MEDICATION:   Dulaglutide (TRULICITY) 2.80 KL/4.9ZP SOPN    PHARMACY:  [id number] - [place], [place] [address]  Patient needs a call to verify if she is having the Dulaglutide (TRULICITY) 6.97 XY/8.0XK SOPN dosage increased. Call patient to advise, patient also needs the refill.

## 2017-08-05 NOTE — Telephone Encounter (Signed)
Ordered

## 2017-08-06 ENCOUNTER — Other Ambulatory Visit: Payer: Self-pay | Admitting: Family

## 2017-08-13 NOTE — Telephone Encounter (Addendum)
Previous referral for lipid clinic ordered to wrong dept (hematology). Pt is aware and we have placed new referral to cardiology.

## 2017-08-13 NOTE — Addendum Note (Signed)
Addended by: Kelle Darting A on: 08/13/2017 05:27 PM   Modules accepted: Orders

## 2017-08-20 ENCOUNTER — Other Ambulatory Visit: Payer: Medicare Other

## 2017-09-03 ENCOUNTER — Telehealth: Payer: Self-pay | Admitting: Family

## 2017-09-03 MED ORDER — AMLODIPINE BESYLATE 10 MG PO TABS: 5.0000 mg | ORAL_TABLET | Freq: Every day | ORAL | 1 refills | Status: DC

## 2017-09-03 NOTE — Telephone Encounter (Signed)
[name] [phone]  amLODipine (NORVASC) 10 MG tablet Benzonatate 100 mg - cough  [facility] - [place], [place] - [address] [phone] (Phone) [phone] (Fax)   [name] called in to get refill on the above medications. Please advise.

## 2017-09-03 NOTE — Telephone Encounter (Signed)
Left detailed message on home # that amlodipine has been refilled and she will need appointment if she has a new cough that she is requesting benzonatate for and to call us back to clarify request or schedule appt.

## 2017-09-04 ENCOUNTER — Ambulatory Visit: Payer: Medicare Other | Admitting: Cardiology

## 2017-09-06 ENCOUNTER — Ambulatory Visit (INDEPENDENT_AMBULATORY_CARE_PROVIDER_SITE_OTHER): Payer: Medicare Other | Admitting: Family

## 2017-09-06 ENCOUNTER — Encounter: Payer: Self-pay | Admitting: Family

## 2017-09-06 VITALS — BP 131/74 | HR 73 | Temp 98.6°F | Resp 16 | Ht 65.0 in | Wt 255.4 lb

## 2017-09-06 DIAGNOSIS — R229 Localized swelling, mass and lump, unspecified: Secondary | ICD-10-CM

## 2017-09-06 DIAGNOSIS — B49 Unspecified mycosis: Secondary | ICD-10-CM

## 2017-09-06 DIAGNOSIS — IMO0002 Reserved for concepts with insufficient information to code with codable children: Secondary | ICD-10-CM

## 2017-09-06 DIAGNOSIS — J40 Bronchitis, not specified as acute or chronic: Secondary | ICD-10-CM | POA: Diagnosis not present

## 2017-09-06 MED ORDER — NYSTATIN 100000 UNIT/GM EX CREA: 1.0000 "application " | TOPICAL_CREAM | Freq: Two times a day (BID) | CUTANEOUS | 2 refills | Status: AC

## 2017-09-06 MED ORDER — BENZONATATE 100 MG PO CAPS: 100.0000 mg | ORAL_CAPSULE | Freq: Three times a day (TID) | ORAL | 0 refills | Status: DC | PRN

## 2017-09-06 MED ORDER — AZITHROMYCIN 250 MG PO TABS: ORAL_TABLET | ORAL | 0 refills | Status: DC

## 2017-09-06 MED ORDER — AMLODIPINE BESYLATE 5 MG PO TABS: 5.0000 mg | ORAL_TABLET | Freq: Every day | ORAL | 3 refills | Status: DC

## 2017-09-06 NOTE — Progress Notes (Signed)
Subjective:    Patient ID: [name], female    DOB: 03-Nov-1956, 61 y.o.   MRN: [id number]  HPI  Pt is a 61 yr old female who presents today with chief complaint of cough. Cough has been present x 1 week. Reports chest pain after coughing, denies associated fever.    Reports stinging in right thigh. She does report some back pain. Notes "lump" in her right thigh.   Redness of belly button. Review of Systems    see HPI  Past Medical History:  Diagnosis Date  . Anxiety   . Arthritis    in knees  . Chronic abdominal pain   . Chronic fatigue syndrome   . Depression   . Diabetes mellitus    diet controlled  . Diverticulosis   . Dyslipidemia   . Esophageal reflux   . Fibromyalgia   . Hypertension   . IBS (irritable bowel syndrome)   . IC (interstitial cystitis)   . Obesity   . Osteopenia   . Restless leg syndrome   . Sleep apnea   . TIA (transient ischemic attack) 08/22/2012     Social History   Social History  . Marital status: Divorced    Spouse name: N/A  . Number of children: 2  . Years of education: N/A   Occupational History  . disabled    Social History Main Topics  . Smoking status: Former Smoker    Packs/day: 0.10    Years: 1.00    Types: Cigarettes    Quit date: 10/19/2012  . Smokeless tobacco: Never Used     Comment: using electronic cigarettes  . Alcohol use Yes     Comment: 2 drinks per month  . Drug use: No  . Sexual activity: No   Other Topics Concern  . Not on file   Social History Narrative   Disabled   Divorced, lives alone - 1 dtr, 1 son    Past Surgical History:  Procedure Laterality Date  . ABDOMINAL EXPLORATION SURGERY  1983   diverticulum removed  . APPENDECTOMY  1983   with exploration  . bladder hydrodistention  12/24/14  . BREAST CYST EXCISION  1978   left  . CERVICAL FUSION  2010  . LAPAROSCOPIC INCISIONAL / UMBILICAL / [place], 2001, 2007   x 3  . LAPAROTOMY  1982   cyst removed from  right ovary and removal of adhesions  . LAPAROTOMY  1991  . NASAL SINUS SURGERY  2004  . RECTOCELE REPAIR  2009  . [place]   Removal of tumor  . TONSILLECTOMY AND ADENOIDECTOMY    . TOTAL KNEE ARTHROPLASTY     bilateral  . VAGINAL HYSTERECTOMY  1992    Family History  Problem Relation Age of Onset  . Breast cancer Mother   . Dementia Mother   . Diabetes Father   . Dementia Father   . Alzheimer's disease Father   . Breast cancer Maternal Grandmother   . Throat cancer Maternal Grandmother   . Esophageal cancer Maternal Grandmother   . Breast cancer Other        maternal great aunts x 5  . Rheum arthritis Other   . Diabetes Maternal Grandfather   . Colon cancer Neg Hx     Allergies  Allergen Reactions  . Atorvastatin     Body aches, worsened interstitial cystitis.  . Buprenorphine Hcl Nausea And Vomiting  . Chlorzoxazone Other (See Comments)  and Nausea Only    Unknown reaction  . Gabapentin Nausea And Vomiting  . Gemfibrozil Nausea And Vomiting  . Metronidazole Nausea And Vomiting  . Morphine And Related Nausea And Vomiting  . Nitrofurantoin Nausea And Vomiting  . Olmesartan Medoxomil-Hctz Nausea And Vomiting  . Ondansetron Nausea And Vomiting  . Pentosan Polysulfate Sodium Other (See Comments)    Unknown reaction  . Pravastatin Other (See Comments)    Body aches, worsening interstitial cystitis.  Marland Kitchen Propoxyphene N-Acetaminophen Nausea And Vomiting  . Ramipril Cough  . Sulfonamide Derivatives Nausea And Vomiting    Current Outpatient Prescriptions on File Prior to Visit  Medication Sig Dispense Refill  . ALPRAZolam (XANAX) 1 MG tablet Take 1 tablet (1 mg total) by mouth 3 (three) times daily as needed for anxiety. 30 tablet 0  . amLODipine (NORVASC) 10 MG tablet Take 0.5 tablets (5 mg total) by mouth at bedtime. 45 tablet 1  . aspirin EC 325 MG tablet Take 325 mg by mouth daily.    Marland Kitchen bismuth subsalicylate (PEPTO-BISMOL) 262 MG/15ML suspension  Take 30 mLs by mouth every 6 (six) hours as needed. 360 mL 0  . Brexpiprazole 2 MG TABS Take 1 tablet by mouth daily.    Marland Kitchen desvenlafaxine (PRISTIQ) 100 MG 24 hr tablet TK 1 T PO D  0  . Diclofenac Sodium (PENNSAID) 2 % SOLN Place 1 application onto the skin at bedtime as needed (knee pain).    Marland Kitchen dicyclomine (BENTYL) 20 MG tablet TAKE 1 TABLET(20 MG) BY MOUTH EVERY 6 HOURS 30 tablet 0  . Dulaglutide (TRULICITY) 5.28 UX/3.2GM SOPN Inject under skin 1x a week in am 4 pen 3  . esomeprazole (NEXIUM) 40 MG capsule TAKE 1 CAPSULE BY MOUTH EVERY DAY AT NOON 90 capsule 1  . ferrous sulfate 325 (65 FE) MG tablet Take 325 mg by mouth daily with breakfast.    . furosemide (LASIX) 20 MG tablet TAKE 1 TABLET BY MOUTH DAILY 30 tablet 3  . glucose blood (ONE TOUCH ULTRA TEST) test strip Use as instructed. Dx: 250.00 100 each 6  . hydrALAZINE (APRESOLINE) 25 MG tablet Take 1 tablet (25 mg total) by mouth 3 (three) times daily. 90 tablet 2  . INVOKANA 300 MG TABS tablet TAKE 1 TABLET(300 MG) BY MOUTH DAILY BEFORE BREAKFAST 30 tablet 5  . JANUVIA 100 MG tablet TAKE 1 TABLET BY MOUTH DAILY WITH BREAKFAST 90 tablet 1  . lidocaine (LIDODERM) 5 % Place 2 patches onto the skin at bedtime as needed (hip pain).     Marland Kitchen losartan (COZAAR) 100 MG tablet TAKE 1 TABLET BY MOUTH DAILY 90 tablet 1  . metoprolol succinate (TOPROL-XL) 100 MG 24 hr tablet TAKE 1 TABLET BY MOUTH DAILY. TAKE WITH OR IMMEDIATELY FOLLOWING A MEAL 90 tablet 1  . mirabegron ER (MYRBETRIQ) 50 MG TB24 Take 50 mg by mouth at bedtime.     Marland Kitchen nystatin cream (MYCOSTATIN) Apply 1 application topically 2 (two) times daily. 30 g 2  . Omega-3 Fatty Acids (FISH OIL) 1000 MG CAPS Take 1,000 mg by mouth daily.    Glory Rosebush DELICA LANCETS 01U MISC TEST BLOOD SUGAR 1 TO 2 TIMES DAILY 100 each 2  . Oxycodone HCl 10 MG TABS Take 10 mg by mouth every 8 (eight) hours as needed.  0  . pioglitazone (ACTOS) 15 MG tablet Take 1 tablet (15 mg total) by mouth daily. 30 tablet 2    . polycarbophil (FIBERCON) 625 MG tablet Take 1  tablet (625 mg total) by mouth daily. 30 tablet 0  . potassium gluconate 595 MG TABS tablet Take 595 mg by mouth daily as needed (leg cramps).    . promethazine (PHENERGAN) 25 MG tablet Take 25 mg by mouth daily as needed for nausea or vomiting.     Marland Kitchen rOPINIRole (REQUIP) 4 MG tablet TAKE 1 TABLET(4 MG) BY MOUTH AT BEDTIME 90 tablet 0  . tiZANidine (ZANAFLEX) 4 MG tablet Take 1 tablet (4 mg total) by mouth at bedtime. 30 tablet 0  . topiramate (TOPAMAX) 50 MG tablet Take 50 mg by mouth 2 (two) times daily as needed (headaches).     . traZODone (DESYREL) 100 MG tablet Take 2-3 tablets at bedtime as needed.    Marland Kitchen UNABLE TO FIND C-Pap machine , use nightly    . valACYclovir (VALTREX) 1000 MG tablet Take 1 tablet (1,000 mg total) by mouth 3 (three) times daily. 30 tablet 0  . [DISCONTINUED] pravastatin (PRAVACHOL) 20 MG tablet Take 1 tablet (20 mg total) by mouth daily. 30 tablet 3   No current facility-administered medications on file prior to visit.     BP 131/74 (BP Location: Right Arm, Cuff Size: Large)   Pulse 73   Temp 98.6 F (37 C) (Oral)   Resp 16   Ht 5\' 5"  (1.651 m)   Wt 255 lb 6.4 oz (115.8 kg)   SpO2 97%   BMI 42.50 kg/m   r Objective:   Physical Exam  Constitutional: She appears well-developed and well-nourished.  HENT:  Head: Normocephalic and atraumatic.  Right Ear: Tympanic membrane and ear canal normal.  Left Ear: Tympanic membrane and ear canal normal.  Mouth/Throat: No oropharyngeal exudate, posterior oropharyngeal edema or posterior oropharyngeal erythema.  Cardiovascular: Normal rate, regular rhythm and normal heart sounds.   No murmur heard. Pulmonary/Chest: Effort normal and breath sounds normal. No respiratory distress. She has no wheezes.  Psychiatric: She has a normal mood and affect. Her behavior is normal. Judgment and thought content normal.  Skin: erythema noted of skin within and surrounding  umbilicus Musculoskeletal- firm mobile mass noted right forearm, approximately 1-2 inches in diameter. Firm mobile mass noted right anterior thigh, approx 2-3 inches in diameter.        Assessment & Plan:  Bronchitis- given duration of sxs will rx with zpak and prn tessalon.  Fungal infection- umbilical erythema appears fungal. Will give trial of nystatin cream. She is advised to call if symptoms worsen or if they do not improve.   Mass- one in right forearm and one in right thigh. Will send for ultrasound for further evaluation. Most likely lipomas.  Not sure if mass is related to tingling pain in her thigh or not- could be radicular pain from back or neuropathy related.

## 2017-09-06 NOTE — Patient Instructions (Addendum)
Please begin zpak for bronchitis.  Begin nystatin cream to your belly button twice daily. Continue tessalon as needed. Call if symptoms worsen or if not improved in 3 days. Schedule ultrasounds on the first floor.

## 2017-09-07 ENCOUNTER — Ambulatory Visit (HOSPITAL_BASED_OUTPATIENT_CLINIC_OR_DEPARTMENT_OTHER)
Admission: RE | Admit: 2017-09-07 | Discharge: 2017-09-07 | Disposition: A | Discharge status: Home / Self Care | Payer: Medicare Other | Source: Ambulatory Visit | Attending: Family | Admitting: Family

## 2017-09-07 DIAGNOSIS — R229 Localized swelling, mass and lump, unspecified: Secondary | ICD-10-CM | POA: Diagnosis present

## 2017-09-07 DIAGNOSIS — IMO0002 Reserved for concepts with insufficient information to code with codable children: Secondary | ICD-10-CM

## 2017-09-08 ENCOUNTER — Encounter: Payer: Self-pay | Admitting: Family

## 2017-09-08 DIAGNOSIS — D179 Benign lipomatous neoplasm, unspecified: Secondary | ICD-10-CM

## 2017-09-09 ENCOUNTER — Ambulatory Visit
Admission: RE | Admit: 2017-09-09 | Discharge: 2017-09-09 | Disposition: A | Discharge status: Home / Self Care | Payer: Medicare Other | Source: Ambulatory Visit | Attending: Internal Medicine | Admitting: Internal Medicine

## 2017-09-09 DIAGNOSIS — E042 Nontoxic multinodular goiter: Secondary | ICD-10-CM

## 2017-09-12 ENCOUNTER — Ambulatory Visit: Payer: Medicare Other | Admitting: Cardiology

## 2017-09-16 ENCOUNTER — Other Ambulatory Visit: Payer: Self-pay | Admitting: Family

## 2017-09-24 ENCOUNTER — Other Ambulatory Visit: Payer: Self-pay | Admitting: Family

## 2017-09-30 ENCOUNTER — Other Ambulatory Visit: Payer: Self-pay | Admitting: Family

## 2017-10-03 ENCOUNTER — Institutional Professional Consult (permissible substitution): Payer: Medicare Other | Admitting: Pulmonary Disease

## 2017-10-07 ENCOUNTER — Ambulatory Visit: Payer: Self-pay | Admitting: Surgery

## 2017-10-12 NOTE — Progress Notes (Deleted)
Cardiology Office Note   Date:  10/12/2017   ID:  [name], DOB 09/03/1956, MRN [id number]  PCP:  [name], NP  Cardiologist:   Luana Tatro Martinique, MD   No chief complaint on file.     History of Present Illness: [name] is a 61 y.o. female who is seen at the request of Valaria Good NP for evaluation of hyperlipidemia. She has a history of DM type 2, HTN, HLD, and TIA. She has a history of intolerance to statins. Prior Echo in 2013 was normal. Myoview in 2014 was normal.     Past Medical History:  Diagnosis Date  . Anxiety   . Arthritis    in knees  . Chronic abdominal pain   . Chronic fatigue syndrome   . Depression   . Diabetes mellitus    diet controlled  . Diverticulosis   . Dyslipidemia   . Esophageal reflux   . Fibromyalgia   . Hypertension   . IBS (irritable bowel syndrome)   . IC (interstitial cystitis)   . Obesity   . Osteopenia   . Restless leg syndrome   . Sleep apnea   . TIA (transient ischemic attack) 08/22/2012    Past Surgical History:  Procedure Laterality Date  . ABDOMINAL EXPLORATION SURGERY  1983   diverticulum removed  . APPENDECTOMY  1983   with exploration  . bladder hydrodistention  12/24/14  . BREAST CYST EXCISION  1978   left  . CERVICAL FUSION  2010  . LAPAROSCOPIC INCISIONAL / UMBILICAL / [place], 2001, 2007   x 3  . LAPAROTOMY  1982   cyst removed from right ovary and removal of adhesions  . LAPAROTOMY  1991  . NASAL SINUS SURGERY  2004  . RECTOCELE REPAIR  2009  . [place]   Removal of tumor  . TONSILLECTOMY AND ADENOIDECTOMY    . TOTAL KNEE ARTHROPLASTY     bilateral  . VAGINAL HYSTERECTOMY  1992     Current Outpatient Medications  Medication Sig Dispense Refill  . ALPRAZolam (XANAX) 1 MG tablet Take 1 tablet (1 mg total) by mouth 3 (three) times daily as needed for anxiety. 30 tablet 0  . amLODipine (NORVASC) 5 MG tablet Take 1 tablet (5 mg total)  by mouth daily. 90 tablet 3  . aspirin EC 325 MG tablet Take 325 mg by mouth daily.    Marland Kitchen azithromycin (ZITHROMAX) 250 MG tablet 2 tabs by mouth today, then one tab once daily for 4 more days 6 tablet 0  . benzonatate (TESSALON) 100 MG capsule Take 1 capsule (100 mg total) by mouth 3 (three) times daily as needed. 20 capsule 0  . bismuth subsalicylate (PEPTO-BISMOL) 262 MG/15ML suspension Take 30 mLs by mouth every 6 (six) hours as needed. 360 mL 0  . Brexpiprazole 2 MG TABS Take 1 tablet by mouth daily.    Marland Kitchen desvenlafaxine (PRISTIQ) 100 MG 24 hr tablet TK 1 T PO D  0  . Diclofenac Sodium (PENNSAID) 2 % SOLN Place 1 application onto the skin at bedtime as needed (knee pain).    Marland Kitchen dicyclomine (BENTYL) 20 MG tablet TAKE 1 TABLET(20 MG) BY MOUTH EVERY 6 HOURS 30 tablet 0  . Dulaglutide (TRULICITY) 8.58 IF/0.2DX SOPN Inject under skin 1x a week in am 4 pen 3  . esomeprazole (NEXIUM) 40 MG capsule TAKE 1 CAPSULE BY MOUTH EVERY DAY AT NOON 90 capsule 1  .  fentaNYL (DURAGESIC - DOSED MCG/HR) 75 MCG/HR Place 1 patch onto the skin every other day.  0  . ferrous sulfate 325 (65 FE) MG tablet Take 325 mg by mouth daily with breakfast.    . furosemide (LASIX) 20 MG tablet TAKE 1 TABLET BY MOUTH DAILY 30 tablet 3  . glucose blood (ONE TOUCH ULTRA TEST) test strip USE TO TEST AS DIRECTED 100 each 1  . hydrALAZINE (APRESOLINE) 25 MG tablet TAKE 1 TABLET(25 MG) BY MOUTH THREE TIMES DAILY 90 tablet 5  . INVOKANA 300 MG TABS tablet TAKE 1 TABLET(300 MG) BY MOUTH DAILY BEFORE BREAKFAST 30 tablet 5  . JANUVIA 100 MG tablet TAKE 1 TABLET BY MOUTH DAILY WITH BREAKFAST 90 tablet 1  . lidocaine (LIDODERM) 5 % Place 2 patches onto the skin at bedtime as needed (hip pain).     Marland Kitchen losartan (COZAAR) 100 MG tablet TAKE 1 TABLET BY MOUTH DAILY 90 tablet 1  . metoprolol succinate (TOPROL-XL) 100 MG 24 hr tablet TAKE 1 TABLET BY MOUTH DAILY. TAKE WITH OR IMMEDIATELY FOLLOWING A MEAL 90 tablet 1  . mirabegron ER (MYRBETRIQ) 50 MG  TB24 Take 50 mg by mouth at bedtime.     Marland Kitchen nystatin cream (MYCOSTATIN) Apply 1 application topically 2 (two) times daily. 30 g 2  . Omega-3 Fatty Acids (FISH OIL) 1000 MG CAPS Take 1,000 mg by mouth daily.    Glory Rosebush DELICA LANCETS 37T MISC TEST BLOOD SUGAR 1 TO 2 TIMES DAILY 100 each 2  . Oxycodone HCl 10 MG TABS Take 10 mg by mouth every 8 (eight) hours as needed.  0  . pioglitazone (ACTOS) 15 MG tablet TAKE 1 TABLET(15 MG) BY MOUTH DAILY 30 tablet 5  . polycarbophil (FIBERCON) 625 MG tablet Take 1 tablet (625 mg total) by mouth daily. 30 tablet 0  . potassium gluconate 595 MG TABS tablet Take 595 mg by mouth daily as needed (leg cramps).    . promethazine (PHENERGAN) 25 MG tablet Take 25 mg by mouth daily as needed for nausea or vomiting.     Marland Kitchen rOPINIRole (REQUIP) 4 MG tablet TAKE 1 TABLET(4 MG) BY MOUTH AT BEDTIME 90 tablet 0  . tiZANidine (ZANAFLEX) 4 MG tablet Take 1 tablet (4 mg total) by mouth at bedtime. 30 tablet 0  . topiramate (TOPAMAX) 50 MG tablet Take 50 mg by mouth 2 (two) times daily as needed (headaches).     . traZODone (DESYREL) 100 MG tablet Take 2-3 tablets at bedtime as needed.    Marland Kitchen UNABLE TO FIND C-Pap machine , use nightly    . valACYclovir (VALTREX) 1000 MG tablet Take 1 tablet (1,000 mg total) by mouth 3 (three) times daily. 30 tablet 0   No current facility-administered medications for this visit.     Allergies:   Atorvastatin; Buprenorphine hcl; Chlorzoxazone; Gabapentin; Gemfibrozil; Metronidazole; Morphine and related; Nitrofurantoin; Olmesartan medoxomil-hctz; Ondansetron; Pentosan polysulfate sodium; Pravastatin; Propoxyphene n-acetaminophen; Ramipril; and Sulfonamide derivatives    Social History:  The patient  reports that she quit smoking about 4 years ago. Her smoking use included cigarettes. She has a 0.10 pack-year smoking history. she has never used smokeless tobacco. She reports that she drinks alcohol. She reports that she does not use drugs.    Family History:  The patient's family history includes Alzheimer's disease in her father; Breast cancer in her maternal grandmother, mother, and other; Dementia in her father and mother; Diabetes in her father and maternal grandfather; Esophageal cancer in her  maternal grandmother; Rheum arthritis in her other; Throat cancer in her maternal grandmother.    ROS:  Please see the history of present illness.   Otherwise, review of systems are positive for none.   All other systems are reviewed and negative.    PHYSICAL EXAM: VS:  There were no vitals taken for this visit. , BMI There is no height or weight on file to calculate BMI. GEN: Well nourished, well developed, in no acute distress  HEENT: normal  Neck: no JVD, carotid bruits, or masses Cardiac: RRR; no murmurs, rubs, or gallops,no edema  Respiratory:  clear to auscultation bilaterally, normal work of breathing GI: soft, nontender, nondistended, + BS MS: no deformity or atrophy  Skin: warm and dry, no rash Neuro:  Strength and sensation are intact Psych: euthymic mood, full affect   EKG:  EKG {ACTION; IS/IS PXT:[id number]} ordered today. The ekg ordered today demonstrates ***   Recent Labs: 06/10/2017: ALT 14; Hemoglobin 12.8; Platelets 183.0 07/15/2017: BUN 21; Creatinine, Ser 0.94; Potassium 4.6; Sodium 135    Lipid Panel    Component Value Date/Time   CHOL 214 (H) 11/23/2016 1444   TRIG 108.0 11/23/2016 1444   TRIG 127 05/26/2009   HDL 61.70 11/23/2016 1444   CHOLHDL 3 11/23/2016 1444   VLDL 21.6 11/23/2016 1444   LDLCALC 131 (H) 11/23/2016 1444   LDLCALC 25 05/26/2009   LDLDIRECT 136.5 10/11/2009 1013      Wt Readings from Last 3 Encounters:  09/06/17 255 lb 6.4 oz (115.8 kg)  07/19/17 253 lb 9.6 oz (115 kg)  07/15/17 254 lb 3.2 oz (115.3 kg)      Other studies Reviewed: Additional studies/ records that were reviewed today include:  Labs dated 07/15/17: A1c 7.8%. Other chemistries normal.   ASSESSMENT AND  PLAN:  1.  ***   Current medicines are reviewed at length with the patient today.  The patient {ACTIONS; HAS/DOES NOT HAVE:19233} concerns regarding medicines.  The following changes have been made:  {PLAN; NO CHANGE:13088:s}  Labs/ tests ordered today include: *** No orders of the defined types were placed in this encounter.    Disposition:   FU with *** in {gen number 4-62:[id number]} {Days to years:10300}  Signed, Timmy Cleverly Martinique, MD  10/12/2017 2:16 PM    [facility] [address], [place], [place], [address] Phone [phone], Fax [phone]

## 2017-10-14 ENCOUNTER — Ambulatory Visit: Payer: Medicare Other | Admitting: Cardiology

## 2017-10-16 ENCOUNTER — Ambulatory Visit: Payer: Medicare Other | Admitting: Family

## 2017-10-17 ENCOUNTER — Telehealth: Payer: Self-pay | Admitting: Internal Medicine

## 2017-10-17 MED ORDER — DULAGLUTIDE 1.5 MG/0.5ML ~~LOC~~ SOAJ: SUBCUTANEOUS | 1 refills | Status: DC

## 2017-10-17 NOTE — Telephone Encounter (Signed)
Patient states that she was told by Dr. Cruzita Lederer that when she ran out of her Trulicity, Dr. was going to up the dose. Patient is out of refills and medication and needs to have the new dosage prescription sent to [facility] in [facility] [place] on [address].

## 2017-10-17 NOTE — Telephone Encounter (Signed)
Medication sent.

## 2017-10-17 NOTE — Telephone Encounter (Signed)
Is it okay to increase her dosage?

## 2017-10-17 NOTE — Telephone Encounter (Signed)
Yes, 1.5 mg weekly, please

## 2017-10-21 ENCOUNTER — Encounter (HOSPITAL_BASED_OUTPATIENT_CLINIC_OR_DEPARTMENT_OTHER): Payer: Self-pay | Admitting: *Deleted

## 2017-10-21 ENCOUNTER — Ambulatory Visit: Payer: Medicare Other | Admitting: Internal Medicine

## 2017-10-21 ENCOUNTER — Other Ambulatory Visit: Payer: Self-pay

## 2017-10-21 MED ORDER — DULAGLUTIDE 1.5 MG/0.5ML ~~LOC~~ SOAJ: SUBCUTANEOUS | 1 refills | Status: DC

## 2017-10-21 NOTE — Progress Notes (Addendum)
SPOKE W/ PT VIA PHONE FOR PRE-OP INTERVIEW.  NPO AFTER MN.  ARRIVE AT 0600 (PER PT UNABLE TO COME SOONER BECAUSE OF FRIEND).  NEEDS ISTAT.  CURRENT EKG IN CHART AND Epic.  WILL TAKE PRISTIQ, TOPROL, AND HYDRALAZINE AM DOS W/ SIPS OF WATER.  PT VERBALIZED UNDERSTANDING TO DO HIBICLENS SHOWER HS BEFORE AND AM DOS.   ADDENDUM:  RECEIVED CALL TODAY VIA PHONE FROM PT STATING THAT HER SURGERY TIME HAS BEEN CHANGED TO 1530 AND ASKED FOR INSTRUCTIONS.  PT VERBALIZED UNDERSTANDING TO ARRIVE AT 1330, BE NPO AFTER MN WITH EXCEPTION CLEAR LIQUIDS UNTIL 0930 (NO CREAM /MILK PRODUCTS) AND IF TAKING MEDICATION AFTER 0930 CAN HAVE ONLY SIPS OF WATER , NOTHING ELSE BY MOUTH.

## 2017-10-22 ENCOUNTER — Telehealth: Payer: Self-pay | Admitting: Internal Medicine

## 2017-10-22 NOTE — Telephone Encounter (Signed)
Cover my meds is calling concerning PA for medication trulicity.

## 2017-10-22 NOTE — Telephone Encounter (Signed)
PA was closed by CoverMyMeds per no PA needed because it is a preferred medication with her insurance. No number listed to call back, but PA is closed.

## 2017-10-24 ENCOUNTER — Encounter (HOSPITAL_BASED_OUTPATIENT_CLINIC_OR_DEPARTMENT_OTHER): Payer: Self-pay | Admitting: Surgery

## 2017-10-24 DIAGNOSIS — R2231 Localized swelling, mass and lump, right upper limb: Secondary | ICD-10-CM

## 2017-10-24 DIAGNOSIS — R2241 Localized swelling, mass and lump, right lower limb: Secondary | ICD-10-CM

## 2017-10-24 DIAGNOSIS — M7989 Other specified soft tissue disorders: Secondary | ICD-10-CM | POA: Diagnosis present

## 2017-10-24 NOTE — H&P (Signed)
General Surgery [facility]  [name] 10/07/2017 3:50 PM Location: [facility] Patient #: [id number] DOB: May 31, 1956 Undefined / Language: [name] / Race: White Female   History of Present Illness Jenna Regal MD; 10/07/2017 4:56 PM) The patient is a 61 year old female who presents with a soft tissue mass.  CC: soft tissue masses right forearm and right thigh  Patient is referred by her primary care provider, [name], for evaluation of soft tissue masses involving the right forearm and right anterior thigh. Patient states that these symptoms present for a number of years. They have gradually increased in size. She has developed pain at the site. She is interested in proceeding with surgical excision. Patient has had no prior such lesions removed. She relates no history of trauma.   Allergies Jenna Lorenzo, LPN; 39/01/91 3:30 PM) Morphine Sulfate (Concentrate) *ANALGESICS - OPIOID*  Sulfa Antibiotics  Zofran ODT *ANTIEMETICS*  Macrobid *URINARY ANTI-INFECTIVES*  Flagyl *ANTI-INFECTIVE AGENTS - MISC.*  Altace *ANTIHYPERTENSIVES*  Elmiron *GENITOURINARY AGENTS - MISCELLANEOUS*  Lopid *ANTIHYPERLIPIDEMICS*   Medication History Jenna Lorenzo, LPN; 07/62/2633 3:54 PM) AmLODIPine Besylate (10MG  Tablet, Oral) Active. Furosemide (20MG  Tablet, Oral) Active. Januvia (100MG  Tablet, Oral) Active. Losartan Potassium (100MG  Tablet, Oral) Active. Myrbetriq (50MG  Tablet ER 24HR, Oral) Active. NexIUM (40MG  Capsule DR, Oral) Active. Pristiq (100MG  Tablet ER 24HR, Oral) Active. Topiramate (50MG  Tablet, Oral) Active. ROPINIRole HCl (4MG  Tablet, Oral) Active. Pristiq (50MG  Tablet ER 24HR, Oral) Active. OxyCODONE HCl (5MG  Tablet, Oral) Active. FentaNYL (100MCG/HR Patch 72HR, Transdermal) Active. Promethazine HCl (25MG  Tablet, Oral) Active. TiZANidine HCl (4MG  Tablet, Oral) Active. Dicyclomine HCl (20MG  Tablet,  Oral) Active. Invokana (300MG  Tablet, Oral) Active. Nystatin (100000 UNIT/GM Cream, External) Active. Rexulti (2MG  Tablet, Oral) Active. Aspirin (325MG  Tablet, Oral) Active. Lidocaine (5% Patch, External) Active. NexIUM (40MG  Capsule DR, Oral) Active. Omega 3 (1000MG  Capsule, Oral) Active. Potassium Gluconate (595 (99 K)MG Tablet, Oral) Active. Medications Reconciled  Vitals Jenna Billings Dockery LPN; 56/25/6389 3:73 PM) 10/07/2017 3:51 PM Weight: 255 lb Height: 65in Body Surface Area: 2.19 m Body Mass Index: 42.43 kg/m  Temp.: 98.17F(Oral)  Pulse: 80 (Regular)  BP: 120/62 (Sitting, Left Arm, Standard)       Physical Exam Jenna Regal MD; 10/07/2017 4:57 PM) The physical exam findings are as follows: Note:See vital signs recorded above  GENERAL APPEARANCE Development: normal Nutritional status: normal Gross deformities: none  SKIN Rash, lesions, ulcers: none Induration, erythema: none Nodules: none palpable  EYES Conjunctiva and lids: normal Pupils: equal and reactive Iris: normal bilaterally  EARS, NOSE, MOUTH, THROAT External ears: no lesion or deformity External nose: no lesion or deformity Hearing: grossly normal Lips: no lesion or deformity Dentition: normal for age Oral mucosa: moist  NECK Symmetric: yes Trachea: midline Thyroid: no palpable nodules in the thyroid bed  CHEST Respiratory effort: normal Retraction or accessory muscle use: no Breath sounds: normal bilaterally Rales, rhonchi, wheeze: none  CARDIOVASCULAR Auscultation: regular rhythm, normal rate Murmurs: none Pulses: carotid and radial pulse 2+ palpable Lower extremity edema: none Lower extremity varicosities: none  MUSCULOSKELETAL Station and gait: normal Digits and nails: no clubbing or cyanosis Muscle strength: grossly normal all extremities Range of motion: grossly normal all extremities Deformity: none On the dorsum of the right forearm are 2  subcutaneous masses, the larger measuring 3 cm in greatest dimension and the smaller measuring 2 cm in greatest dimension, discrete, mobile, mildly tender to palpation. On the anterior right thigh are 2 soft tissue masses measuring 4 cm  and 3 cm respectively, discrete, mobile, tender to palpation without overlying cutaneous changes.  LYMPHATIC Cervical: none palpable Supraclavicular: none palpable  PSYCHIATRIC Oriented to person, place, and time: yes Mood and affect: normal for situation Judgment and insight: appropriate for situation    Assessment & Plan Jenna Regal MD; 10/07/2017 4:58 PM) MASS OF SOFT TISSUE OF RIGHT LOWER EXTREMITY (R22.41) MASS OF SOFT TISSUE OF RIGHT UPPER EXTREMITY (R22.31) Current Plans Patient presents on referral from her primary care provider for evaluation of enlarging and symptomatic soft tissue masses involving the dorsum of the right forearm in the right anterior thigh. Patient would like to proceed with surgical excision for definitive diagnosis and for relief of symptoms. We discussed the risk of the procedure including the risk of local areas of anesthesia and the risk of seroma formation. We discussed the procedure, the use of sedation, and the postoperative recovery. She understands and wishes to proceed with surgery in the near future.  The risks and benefits of the procedure have been discussed at length with the patient. The patient understands the proposed procedure, potential alternative treatments, and the course of recovery to be expected. All of the patient's questions have been answered at this time. The patient wishes to proceed with surgery.  Armandina Gemma, [facility] Office: [phone]

## 2017-10-25 ENCOUNTER — Encounter: Payer: Self-pay | Admitting: Family

## 2017-10-25 ENCOUNTER — Ambulatory Visit (INDEPENDENT_AMBULATORY_CARE_PROVIDER_SITE_OTHER): Payer: Medicare Other | Admitting: Family

## 2017-10-25 VITALS — BP 144/68 | HR 105 | Temp 98.6°F | Resp 16 | Ht 65.5 in | Wt 256.0 lb

## 2017-10-25 DIAGNOSIS — Z23 Encounter for immunization: Secondary | ICD-10-CM

## 2017-10-25 DIAGNOSIS — G43909 Migraine, unspecified, not intractable, without status migrainosus: Secondary | ICD-10-CM

## 2017-10-25 DIAGNOSIS — E119 Type 2 diabetes mellitus without complications: Secondary | ICD-10-CM | POA: Diagnosis not present

## 2017-10-25 DIAGNOSIS — I10 Essential (primary) hypertension: Secondary | ICD-10-CM

## 2017-10-25 DIAGNOSIS — Z5181 Encounter for therapeutic drug level monitoring: Secondary | ICD-10-CM

## 2017-10-25 LAB — COMPREHENSIVE METABOLIC PANEL
AG RATIO: 1.4 (calc) (ref 1.0–2.5)
ALT: 10 U/L (ref 6–29)
AST: 12 U/L (ref 10–35)
Albumin: 3.9 g/dL (ref 3.6–5.1)
Alkaline phosphatase (APISO): 79 U/L (ref 33–130)
BUN: 24 mg/dL (ref 7–25)
CO2: 27 mmol/L (ref 20–32)
Calcium: 9.3 mg/dL (ref 8.6–10.4)
Chloride: 99 mmol/L (ref 98–110)
Creat: 0.93 mg/dL (ref 0.50–0.99)
GLUCOSE: 168 mg/dL — AB (ref 65–99)
Globulin: 2.8 g/dL (calc) (ref 1.9–3.7)
Potassium: 4.4 mmol/L (ref 3.5–5.3)
Sodium: 134 mmol/L — ABNORMAL LOW (ref 135–146)
Total Bilirubin: 0.8 mg/dL (ref 0.2–1.2)
Total Protein: 6.7 g/dL (ref 6.1–8.1)

## 2017-10-25 MED ORDER — TOPIRAMATE 50 MG PO TABS: 50.0000 mg | ORAL_TABLET | Freq: Two times a day (BID) | ORAL | 3 refills | Status: DC | PRN

## 2017-10-25 NOTE — Patient Instructions (Signed)
Please complete lab work prior to leaving. Good luck with your upcoming procedure.

## 2017-10-25 NOTE — Progress Notes (Signed)
Subjective:    Patient ID: [name], female    DOB: 11/23/1955, 61 y.o.   MRN: [id number]  HPI  [name] is a 61 yr old female who presents today for follow up.   DM2- maintained on trulicity.  Reports that she forgot trulicity last Sunday.  She is working with Dr. Cruzita Lederer (endo)- follows back up in February.  Lab Results  Component Value Date   HGBA1C 7.8 (H) 07/15/2017   HGBA1C 7.4 (H) 11/23/2016   HGBA1C 7.3 (H) 08/14/2016   Lab Results  Component Value Date   MICROALBUR 0.2 11/23/2016   LDLCALC 131 (H) 11/23/2016   CREATININE 0.94 07/15/2017   HTN- maintained on metoprolol, losartan,  BP Readings from Last 3 Encounters:  10/25/17 (!) 144/68  09/06/17 131/74  07/19/17 133/73   Hyperlipidemia- unable to take statins.    Lab Results  Component Value Date   CHOL 214 (H) 11/23/2016   HDL 61.70 11/23/2016   LDLCALC 131 (H) 11/23/2016   LDLDIRECT 136.5 10/11/2009   TRIG 108.0 11/23/2016   CHOLHDL 3 11/23/2016   Migraines- reports that she had old topamax but it was "outdated."   Review of Systems See HPI  Past Medical History:  Diagnosis Date  . Anxiety   . Arthritis    knees  . Chronic fatigue syndrome   . Chronic pain syndrome    pain clinic w/ [facility]  . Dyslipidemia   . Fibromyalgia   . GERD (gastroesophageal reflux disease)   . History of transient ischemic attack (TIA) 08/2012   per pt no residual  . Hypertension   . IBS (irritable bowel syndrome)   . IC (interstitial cystitis) urologist--- dr Alona Bene [facility]  . Major depression   . Migraine   . Mild obstructive sleep apnea    per study 2005  mild osa, last used cpap 2016,  per pt needs another sleep study   . Mixed incontinence urge and stress   . Multiple thyroid nodules    bilateral --- hx benign bx 2010  . Osteopenia   . PONV (postoperative nausea and vomiting)   . Restless leg syndrome   . Soft tissue mass    right forearm and right thigh  . Type 2 diabetes mellitus ([place])      last A1c 7.8 on 07-15-2017 in epic  . Wears glasses      Social History   Socioeconomic History  . Marital status: Divorced    Spouse name: Not on file  . Number of children: 2  . Years of education: Not on file  . Highest education level: Not on file  Social Needs  . Financial resource strain: Not on file  . Food insecurity - worry: Not on file  . Food insecurity - inability: Not on file  . Transportation needs - medical: Not on file  . Transportation needs - non-medical: Not on file  Occupational History  . Occupation: disabled  Tobacco Use  . Smoking status: Former Smoker    Packs/day: 0.10    Years: 1.00    Pack years: 0.10    Types: Cigarettes    Last attempt to quit: 10/19/2012    Years since quitting: 5.0  . Smokeless tobacco: Never Used  Substance and Sexual Activity  . Alcohol use: No    Frequency: Never    Comment: rare  . Drug use: No  . Sexual activity: No  Other Topics Concern  . Not on file  Social History Narrative  Disabled   Divorced, lives alone - 1 dtr, 1 son    Past Surgical History:  Procedure Laterality Date  . ABDOMINAL EXPLORATION SURGERY  1983   MECKEL diverticulum removed and APPENDECTOMY and hemicolectomy  . ANTERIOR CERVICAL DECOMP/DISCECTOMY FUSION  01-04-2009   dr pool  [facility]  . BREAST CYST EXCISION Left 1978   benign  . CARDIOVASCULAR STRESS TEST  03/17/2013   normal nuclear study w/ no ischemia/  normal LV function and wall motion , ef 62%  . COLONOSCOPY  last one 08-14-2011  . DIAGNOSTIC LAPAROSCOPY  01-18-2004  dr Radene Knee; 1996; 1995  . ESOPHAGOGASTRODUODENOSCOPY  last one 09-03-2013   dr Candie Echevaria  . EXCISION LIPOMA LEFT THIGH  04-03-2004  dr Margot Chimes  . EXPLORATORY LAPAROTOMY W/ RIGHT OVARIAN CYSTECTOMY AND LYSIS ADHESIONS  1982  . LAPAROSCOPIC ASSISTED VAGINAL HYSTERECTOMY  1992  . LIPOMA EXCISION  05-16-2001;  1998;  [place]  . MULTIPLE CYSTO/  HYDRODISTENTION/ INSTILLATION THERAPY  since 2001;  last one 08/ 2018   dr  Amalia Hailey  [facility]  . NASAL SINUS SURGERY  2004  . RECTOCELE REPAIR  07/2008  . TONSILLECTOMY AND ADENOIDECTOMY  1970  . TOTAL KNEE ARTHROPLASTY Bilateral left 07-31-2010/  right 08-29-2010   dr Alvan Dame  [facility]  . TRANSTHORACIC ECHOCARDIOGRAM  08/23/2012   normal  w/ ef 60-65%  . VENTRAL HERNIA REPAIR  06-05-2006 dr Excell Seltzer [facility];  2001; 1992    Family History  Problem Relation Age of Onset  . Breast cancer Mother   . Dementia Mother   . Diabetes Father   . Dementia Father   . Alzheimer's disease Father   . Breast cancer Maternal Grandmother   . Throat cancer Maternal Grandmother   . Esophageal cancer Maternal Grandmother   . Breast cancer Other        maternal great aunts x 5  . Rheum arthritis Other   . Diabetes Maternal Grandfather   . Colon cancer Neg Hx     Allergies  Allergen Reactions  . Atorvastatin     Body aches, worsened interstitial cystitis.  . Buprenorphine Hcl Nausea And Vomiting  . Chlorzoxazone Other (See Comments) and Nausea Only    Unknown reaction  . Darvon [Propoxyphene] Other (See Comments)    hallucinations  . Gabapentin Nausea And Vomiting  . Gemfibrozil Nausea And Vomiting  . Metronidazole Nausea And Vomiting  . Morphine And Related Nausea And Vomiting  . Nitrofurantoin Nausea And Vomiting  . Olmesartan Medoxomil-Hctz Nausea And Vomiting  . Ondansetron Nausea And Vomiting  . Pentosan Polysulfate Sodium Other (See Comments)    Unknown reaction  . Pravastatin Other (See Comments)    Body aches, worsening interstitial cystitis.  . Ramipril Cough  . Sulfa Antibiotics Nausea And Vomiting  . Elmiron [Pentosan Polysulfate] Nausea And Vomiting and Rash    Current Outpatient Medications on File Prior to Visit  Medication Sig Dispense Refill  . ALPRAZolam (XANAX) 1 MG tablet Take 1 tablet (1 mg total) by mouth 3 (three) times daily as needed for anxiety. (Patient taking differently: Take 1 mg by mouth 3 (three) times daily as needed for anxiety. ) 30 tablet 0    . amLODipine (NORVASC) 5 MG tablet Take 1 tablet (5 mg total) by mouth daily. (Patient taking differently: Take 5 mg by mouth every evening. ) 90 tablet 3  . aspirin EC 325 MG tablet Take 325 mg by mouth daily.    . benzonatate (TESSALON) 100 MG capsule Take 1  capsule (100 mg total) by mouth 3 (three) times daily as needed. (Patient taking differently: Take 100 mg by mouth 3 (three) times daily as needed. ) 20 capsule 0  . bismuth subsalicylate (PEPTO-BISMOL) 262 MG/15ML suspension Take 30 mLs by mouth every 6 (six) hours as needed. 360 mL 0  . desvenlafaxine (PRISTIQ) 100 MG 24 hr tablet TK 1 T PO D--- takes in am  0  . Diclofenac Sodium (PENNSAID) 2 % SOLN Place 1 application onto the skin at bedtime as needed (knee pain).    Marland Kitchen dicyclomine (BENTYL) 20 MG tablet TAKE 1 TABLET(20 MG) BY MOUTH EVERY 6 HOURS 30 tablet 0  . Dulaglutide (TRULICITY) 1.5 ZM/6.2HU SOPN Inject into skin weekly (Patient taking differently: Inject 0.75 mg as directed once a week. Inject into skin weekly---  Sunday's) 4 pen 1  . esomeprazole (NEXIUM) 40 MG capsule TAKE 1 CAPSULE BY MOUTH EVERY DAY AT NOON (Patient taking differently: TAKE 1 CAPSULE BY MOUTH EVERY DAY AT NOON--- takes in pm) 90 capsule 1  . fentaNYL (DURAGESIC - DOSED MCG/HR) 75 MCG/HR Place 1 patch onto the skin every other day.  0  . furosemide (LASIX) 20 MG tablet TAKE 1 TABLET BY MOUTH DAILY (Patient taking differently: TAKE 1 TABLET BY MOUTH DAILY---  takes prn for fluid retention) 30 tablet 3  . glucose blood (ONE TOUCH ULTRA TEST) test strip USE TO TEST AS DIRECTED 100 each 1  . hydrALAZINE (APRESOLINE) 25 MG tablet TAKE 1 TABLET(25 MG) BY MOUTH THREE TIMES DAILY (Patient taking differently: TAKE 1 TABLET(25 MG) BY MOUTH THREE TIMES DAILY  (for IC)) 90 tablet 5  . INVOKANA 300 MG TABS tablet TAKE 1 TABLET(300 MG) BY MOUTH DAILY BEFORE BREAKFAST 30 tablet 5  . JANUVIA 100 MG tablet TAKE 1 TABLET BY MOUTH DAILY WITH BREAKFAST 90 tablet 1  . lidocaine  (LIDODERM) 5 % Place 2 patches onto the skin at bedtime as needed (hip pain).     Marland Kitchen losartan (COZAAR) 100 MG tablet TAKE 1 TABLET BY MOUTH DAILY (Patient taking differently: TAKE 1 TABLET BY MOUTH DAILY--- takes in pm) 90 tablet 1  . metoprolol succinate (TOPROL-XL) 100 MG 24 hr tablet TAKE 1 TABLET BY MOUTH DAILY. TAKE WITH OR IMMEDIATELY FOLLOWING A MEAL (Patient taking differently: TAKE 1 TABLET BY MOUTH DAILY. TAKE WITH OR IMMEDIATELY FOLLOWING A MEAL---  takes in am) 90 tablet 1  . mirabegron ER (MYRBETRIQ) 50 MG TB24 Take 50 mg by mouth at bedtime.     Marland Kitchen nystatin cream (MYCOSTATIN) Apply 1 application topically 2 (two) times daily. 30 g 2  . Omega-3 Fatty Acids (FISH OIL) 1000 MG CAPS Take 1,000 mg by mouth daily.    Glory Rosebush DELICA LANCETS 76L MISC TEST BLOOD SUGAR 1 TO 2 TIMES DAILY 100 each 2  . Oxycodone HCl 10 MG TABS Take 10 mg by mouth every 8 (eight) hours as needed.   0  . pioglitazone (ACTOS) 15 MG tablet TAKE 1 TABLET(15 MG) BY MOUTH DAILY (Patient taking differently: TAKE 1 TABLET(15 MG) BY MOUTH DAILY---  takes in am) 30 tablet 5  . potassium gluconate 595 MG TABS tablet Take 595 mg by mouth daily as needed (leg cramps).    . promethazine (PHENERGAN) 25 MG tablet Take 25 mg by mouth daily as needed for nausea or vomiting.     Marland Kitchen rOPINIRole (REQUIP) 4 MG tablet TAKE 1 TABLET(4 MG) BY MOUTH AT BEDTIME 90 tablet 0  . tiZANidine (ZANAFLEX) 4 MG  tablet Take 1 tablet (4 mg total) by mouth at bedtime. 30 tablet 0  . traZODone (DESYREL) 100 MG tablet Take 100-300 mg by mouth at bedtime as needed. Take 2-3 tablets at bedtime as needed.    . valACYclovir (VALTREX) 1000 MG tablet Take 1 tablet (1,000 mg total) by mouth 3 (three) times daily. (Patient taking differently: Take 1,000 mg by mouth 3 (three) times daily. ) 30 tablet 0  . [DISCONTINUED] pravastatin (PRAVACHOL) 20 MG tablet Take 1 tablet (20 mg total) by mouth daily. 30 tablet 3   No current facility-administered medications on  file prior to visit.     BP (!) 144/68 (BP Location: Right Arm, Patient Position: Sitting, Cuff Size: Large)   Pulse (!) 105   Temp 98.6 F (37 C) (Oral)   Resp 16   Ht 5' 5.5" (1.664 m)   Wt 256 lb (116.1 kg)   SpO2 97%   BMI 41.95 kg/m       Objective:   Physical Exam  Constitutional: She is oriented to person, place, and time. She appears well-developed and well-nourished.  HENT:  Head: Normocephalic and atraumatic.  Cardiovascular: Normal rate, regular rhythm and normal heart sounds.  No murmur heard. Pulmonary/Chest: Effort normal and breath sounds normal. No respiratory distress. She has no wheezes.  Musculoskeletal: She exhibits no edema.  Neurological: She is alert and oriented to person, place, and time.  Psychiatric: She has a normal mood and affect. Her behavior is normal. Judgment and thought content normal.          Assessment & Plan:  DM2- reinforced compliance with meds.  Will defer A1C to endo.  HTN- BP stable on current meds, obtain same.  Migraines- uncontrolled. Refill provided for topamax. She report bottle she has at home is "outdated." She is advised to let me know if symptoms fail to improve. Check follow up cmet

## 2017-10-26 ENCOUNTER — Encounter: Payer: Self-pay | Admitting: Family

## 2017-10-31 ENCOUNTER — Institutional Professional Consult (permissible substitution): Payer: Medicare Other | Admitting: Pulmonary Disease

## 2017-11-01 NOTE — Progress Notes (Addendum)
Pt updated on change in times. Pt stated no changes from previous hx by [facility].  Pt instructed NPO p MN 12/18 x clears til 0900. . Follow instructions as told by Langley Gauss a week ago.  hibiclens shower hs and am of surgery.  To [facility] 12/19 @ 1300. Pt verbalized her understanding.

## 2017-11-03 ENCOUNTER — Other Ambulatory Visit: Payer: Self-pay | Admitting: Family

## 2017-11-05 ENCOUNTER — Other Ambulatory Visit: Payer: Self-pay

## 2017-11-05 ENCOUNTER — Telehealth: Payer: Self-pay | Admitting: Internal Medicine

## 2017-11-05 NOTE — Telephone Encounter (Signed)
Pt called wanting to know if she is still on medication of JANUVIA 100 MG tablet? Please advise? Pt states please message if no answer.  Call pt @ [phone]. Thank you!

## 2017-11-05 NOTE — Telephone Encounter (Signed)
I see that Januvia is still on patent's current med list but isn't prescribed by you. Patient is wanting to know should she still take it or did you d/c it?

## 2017-11-05 NOTE — Telephone Encounter (Signed)
If she is on Trulicity, she should be off Januvia!

## 2017-11-06 ENCOUNTER — Encounter (HOSPITAL_BASED_OUTPATIENT_CLINIC_OR_DEPARTMENT_OTHER)
Admission: RE | Disposition: A | Discharge status: Home / Self Care | Payer: Self-pay | Source: Ambulatory Visit | Attending: Surgery

## 2017-11-06 ENCOUNTER — Ambulatory Visit (HOSPITAL_BASED_OUTPATIENT_CLINIC_OR_DEPARTMENT_OTHER)
Admission: RE | Admit: 2017-11-06 | Discharge: 2017-11-06 | Disposition: A | Discharge status: Home / Self Care | Payer: Medicare Other | Source: Ambulatory Visit | Attending: Surgery | Admitting: Surgery

## 2017-11-06 ENCOUNTER — Encounter (HOSPITAL_BASED_OUTPATIENT_CLINIC_OR_DEPARTMENT_OTHER): Payer: Self-pay | Admitting: Anesthesiology

## 2017-11-06 ENCOUNTER — Encounter (HOSPITAL_BASED_OUTPATIENT_CLINIC_OR_DEPARTMENT_OTHER): Payer: Self-pay

## 2017-11-06 DIAGNOSIS — Z882 Allergy status to sulfonamides status: Secondary | ICD-10-CM | POA: Insufficient documentation

## 2017-11-06 DIAGNOSIS — R2231 Localized swelling, mass and lump, right upper limb: Secondary | ICD-10-CM | POA: Diagnosis present

## 2017-11-06 DIAGNOSIS — Z881 Allergy status to other antibiotic agents status: Secondary | ICD-10-CM | POA: Diagnosis not present

## 2017-11-06 DIAGNOSIS — D1721 Benign lipomatous neoplasm of skin and subcutaneous tissue of right arm: Secondary | ICD-10-CM | POA: Diagnosis not present

## 2017-11-06 DIAGNOSIS — Z888 Allergy status to other drugs, medicaments and biological substances status: Secondary | ICD-10-CM | POA: Diagnosis not present

## 2017-11-06 DIAGNOSIS — Z7984 Long term (current) use of oral hypoglycemic drugs: Secondary | ICD-10-CM | POA: Insufficient documentation

## 2017-11-06 DIAGNOSIS — M7989 Other specified soft tissue disorders: Secondary | ICD-10-CM | POA: Diagnosis present

## 2017-11-06 DIAGNOSIS — R2241 Localized swelling, mass and lump, right lower limb: Secondary | ICD-10-CM

## 2017-11-06 DIAGNOSIS — Z79899 Other long term (current) drug therapy: Secondary | ICD-10-CM | POA: Insufficient documentation

## 2017-11-06 DIAGNOSIS — D1723 Benign lipomatous neoplasm of skin and subcutaneous tissue of right leg: Secondary | ICD-10-CM | POA: Insufficient documentation

## 2017-11-06 HISTORY — DX: Other specified postprocedural states: Z98.890

## 2017-11-06 HISTORY — DX: Chronic pain syndrome: G89.4

## 2017-11-06 HISTORY — DX: Migraine, unspecified, not intractable, without status migrainosus: G43.909

## 2017-11-06 HISTORY — DX: Nontoxic multinodular goiter: E04.2

## 2017-11-06 HISTORY — DX: Other specified soft tissue disorders: M79.89

## 2017-11-06 HISTORY — DX: Obstructive sleep apnea (adult) (pediatric): G47.33

## 2017-11-06 HISTORY — PX: MASS EXCISION: SHX2000

## 2017-11-06 HISTORY — DX: Presence of spectacles and contact lenses: Z97.3

## 2017-11-06 HISTORY — DX: Nausea with vomiting, unspecified: R11.2

## 2017-11-06 HISTORY — DX: Personal history of transient ischemic attack (TIA), and cerebral infarction without residual deficits: Z86.73

## 2017-11-06 HISTORY — DX: Type 2 diabetes mellitus without complications: E11.9

## 2017-11-06 HISTORY — DX: Gastro-esophageal reflux disease without esophagitis: K21.9

## 2017-11-06 HISTORY — DX: Major depressive disorder, single episode, unspecified: F32.9

## 2017-11-06 HISTORY — DX: Mixed incontinence: N39.46

## 2017-11-06 LAB — POCT I-STAT 4, (NA,K, GLUC, HGB,HCT)
GLUCOSE: 107 mg/dL — AB (ref 65–99)
HCT: 37 % (ref 36.0–46.0)
Hemoglobin: 12.6 g/dL (ref 12.0–15.0)
POTASSIUM: 4.4 mmol/L (ref 3.5–5.1)
SODIUM: 138 mmol/L (ref 135–145)

## 2017-11-06 LAB — GLUCOSE, CAPILLARY: Glucose-Capillary: 107 mg/dL — ABNORMAL HIGH (ref 65–99)

## 2017-11-06 SURGERY — EXCISION MASS
Anesthesia: Monitor Anesthesia Care | Laterality: Right

## 2017-11-06 MED ORDER — LACTATED RINGERS IV SOLN
INTRAVENOUS | Status: DC
  Administered 2017-11-06: 1000 mL via INTRAVENOUS
  Filled 2017-11-06: qty 1000

## 2017-11-06 MED ORDER — CEFAZOLIN SODIUM-DEXTROSE 2-4 GM/100ML-% IV SOLN
2.0000 g | INTRAVENOUS | Status: AC
  Administered 2017-11-06: 2 g via INTRAVENOUS
  Filled 2017-11-06: qty 100

## 2017-11-06 MED ORDER — CEFAZOLIN SODIUM-DEXTROSE 2-4 GM/100ML-% IV SOLN
INTRAVENOUS | Status: AC
  Filled 2017-11-06: qty 100

## 2017-11-06 MED ORDER — PROPOFOL 10 MG/ML IV BOLUS
INTRAVENOUS | Status: AC
  Filled 2017-11-06: qty 20

## 2017-11-06 MED ORDER — HYDROMORPHONE HCL 1 MG/ML IJ SOLN
0.2500 mg | INTRAMUSCULAR | Status: DC | PRN
  Filled 2017-11-06: qty 0.5

## 2017-11-06 MED ORDER — CHLORHEXIDINE GLUCONATE CLOTH 2 % EX PADS
6.0000 | MEDICATED_PAD | Freq: Once | CUTANEOUS | Status: DC
  Filled 2017-11-06: qty 6

## 2017-11-06 MED ORDER — BUPIVACAINE HCL 0.5 % IJ SOLN
INTRAMUSCULAR | Status: DC | PRN
  Administered 2017-11-06: 30 mL

## 2017-11-06 MED ORDER — FENTANYL CITRATE (PF) 100 MCG/2ML IJ SOLN
INTRAMUSCULAR | Status: DC | PRN
  Administered 2017-11-06: 12.5 ug via INTRAVENOUS
  Administered 2017-11-06 (×3): 25 ug via INTRAVENOUS
  Administered 2017-11-06 (×2): 12.5 ug via INTRAVENOUS
  Administered 2017-11-06 (×2): 25 ug via INTRAVENOUS
  Administered 2017-11-06: 12.5 ug via INTRAVENOUS
  Administered 2017-11-06: 25 ug via INTRAVENOUS

## 2017-11-06 MED ORDER — PROPOFOL 10 MG/ML IV BOLUS
INTRAVENOUS | Status: DC | PRN
  Administered 2017-11-06 (×2): 20 mg via INTRAVENOUS

## 2017-11-06 MED ORDER — LIDOCAINE HCL (CARDIAC) 20 MG/ML IV SOLN
INTRAVENOUS | Status: DC | PRN
  Administered 2017-11-06: 80 mg via INTRAVENOUS

## 2017-11-06 MED ORDER — FENTANYL CITRATE (PF) 100 MCG/2ML IJ SOLN
INTRAMUSCULAR | Status: AC
  Filled 2017-11-06: qty 2

## 2017-11-06 MED ORDER — PROMETHAZINE HCL 25 MG/ML IJ SOLN
6.2500 mg | INTRAMUSCULAR | Status: DC | PRN
  Filled 2017-11-06: qty 1

## 2017-11-06 MED ORDER — DEXAMETHASONE SODIUM PHOSPHATE 4 MG/ML IJ SOLN
INTRAMUSCULAR | Status: DC | PRN
  Administered 2017-11-06: 10 mg via INTRAVENOUS

## 2017-11-06 MED ORDER — MIDAZOLAM HCL 5 MG/5ML IJ SOLN
INTRAMUSCULAR | Status: DC | PRN
  Administered 2017-11-06: 2 mg via INTRAVENOUS

## 2017-11-06 MED ORDER — DEXAMETHASONE SODIUM PHOSPHATE 10 MG/ML IJ SOLN
INTRAMUSCULAR | Status: AC
Start: 2017-11-06 — End: ?
  Filled 2017-11-06: qty 1

## 2017-11-06 MED ORDER — LIDOCAINE 2% (20 MG/ML) 5 ML SYRINGE
INTRAMUSCULAR | Status: AC
  Filled 2017-11-06: qty 5

## 2017-11-06 MED ORDER — KETOROLAC TROMETHAMINE 30 MG/ML IJ SOLN
INTRAMUSCULAR | Status: DC | PRN
  Administered 2017-11-06: 30 mg via INTRAVENOUS

## 2017-11-06 MED ORDER — PROPOFOL 500 MG/50ML IV EMUL
INTRAVENOUS | Status: DC | PRN
  Administered 2017-11-06: 25 ug/kg/min via INTRAVENOUS

## 2017-11-06 MED ORDER — MIDAZOLAM HCL 2 MG/2ML IJ SOLN
INTRAMUSCULAR | Status: AC
  Filled 2017-11-06: qty 2

## 2017-11-06 SURGICAL SUPPLY — 50 items
BANDAGE ACE 4X5 VEL STRL LF (GAUZE/BANDAGES/DRESSINGS) ×3 IMPLANT
BENZOIN TINCTURE PRP APPL 2/3 (GAUZE/BANDAGES/DRESSINGS) ×6 IMPLANT
BLADE SURG 15 STRL LF DISP TIS (BLADE) ×1 IMPLANT
BLADE SURG 15 STRL SS (BLADE) ×2
BNDG COHESIVE 4X5 TAN NS LF (GAUZE/BANDAGES/DRESSINGS) IMPLANT
BNDG GAUZE ELAST 4 BULKY (GAUZE/BANDAGES/DRESSINGS) IMPLANT
CHLORAPREP W/TINT 26ML (MISCELLANEOUS) ×3 IMPLANT
CLEANER CAUTERY TIP 5X5 PAD (MISCELLANEOUS) IMPLANT
CLOSURE WOUND 1/2 X4 (GAUZE/BANDAGES/DRESSINGS) ×1
COVER BACK TABLE 60X90IN (DRAPES) ×3 IMPLANT
COVER MAYO STAND STRL (DRAPES) ×3 IMPLANT
DRAPE EXTREMITY T 121X128X90 (DRAPE) IMPLANT
DRAPE LAPAROTOMY 100X72 PEDS (DRAPES) IMPLANT
DRAPE LG THREE QUARTER DISP (DRAPES) IMPLANT
DRAPE ORTHO SPLIT 77X108 STRL (DRAPES)
DRAPE SURG ORHT 6 SPLT 77X108 (DRAPES) IMPLANT
DRAPE UTILITY XL STRL (DRAPES) ×3 IMPLANT
DRSG TEGADERM 2-3/8X2-3/4 SM (GAUZE/BANDAGES/DRESSINGS) IMPLANT
DRSG TEGADERM 4X4.75 (GAUZE/BANDAGES/DRESSINGS) IMPLANT
ELECT REM PT RETURN 9FT ADLT (ELECTROSURGICAL) ×3
ELECTRODE REM PT RTRN 9FT ADLT (ELECTROSURGICAL) ×1 IMPLANT
GAUZE SPONGE 4X4 12PLY STRL LF (GAUZE/BANDAGES/DRESSINGS) ×6 IMPLANT
GLOVE SURG ORTHO 8.0 STRL STRW (GLOVE) ×3 IMPLANT
GOWN STRL REUS W/ TWL LRG LVL3 (GOWN DISPOSABLE) ×1 IMPLANT
GOWN STRL REUS W/ TWL XL LVL3 (GOWN DISPOSABLE) ×1 IMPLANT
GOWN STRL REUS W/TWL LRG LVL3 (GOWN DISPOSABLE) ×2
GOWN STRL REUS W/TWL XL LVL3 (GOWN DISPOSABLE) ×2
KIT RM TURNOVER CYSTO AR (KITS) ×3 IMPLANT
NEEDLE HYPO 25X1 1.5 SAFETY (NEEDLE) ×3 IMPLANT
NS IRRIG 500ML POUR BTL (IV SOLUTION) ×3 IMPLANT
PACK BASIN DAY SURGERY FS (CUSTOM PROCEDURE TRAY) ×3 IMPLANT
PAD CLEANER CAUTERY TIP 5X5 (MISCELLANEOUS)
PENCIL BUTTON HOLSTER BLD 10FT (ELECTRODE) ×3 IMPLANT
SLEEVE SCD COMPRESS KNEE MED (MISCELLANEOUS) ×3 IMPLANT
SPONGE GAUZE 4X4 12PLY STER LF (GAUZE/BANDAGES/DRESSINGS) ×3 IMPLANT
STOCKINETTE 4X48 STRL (DRAPES) IMPLANT
STRIP CLOSURE SKIN 1/2X4 (GAUZE/BANDAGES/DRESSINGS) ×2 IMPLANT
SUT ETHILON 3 0 PS 1 (SUTURE) IMPLANT
SUT ETHILON 4 0 PS 2 18 (SUTURE) IMPLANT
SUT MNCRL AB 4-0 PS2 18 (SUTURE) ×6 IMPLANT
SUT VIC AB 3-0 SH 18 (SUTURE) IMPLANT
SUT VICRYL 3-0 CR8 SH (SUTURE) ×3 IMPLANT
SUT VICRYL 4-0 PS2 18IN ABS (SUTURE) IMPLANT
SYR CONTROL 10ML LL (SYRINGE) ×3 IMPLANT
TAPE CLOTH SURG 6X10 WHT LF (GAUZE/BANDAGES/DRESSINGS) ×3 IMPLANT
TOWEL OR 17X24 6PK STRL BLUE (TOWEL DISPOSABLE) ×6 IMPLANT
TRAY DSU PREP LF (CUSTOM PROCEDURE TRAY) ×3 IMPLANT
TUBE CONNECTING 12'X1/4 (SUCTIONS)
TUBE CONNECTING 12X1/4 (SUCTIONS) IMPLANT
WATER STERILE IRR 500ML POUR (IV SOLUTION) IMPLANT

## 2017-11-06 NOTE — Brief Op Note (Signed)
11/06/2017  4:07 PM  PATIENT:  [name]  61 y.o. female  PRE-OPERATIVE DIAGNOSIS:  SOFT TISSUE MASSES RIGHT FOREARM AND RIGHT THIGH  POST-OPERATIVE DIAGNOSIS:  SOFT TISSUE MASSES RIGHT FOREARM AND RIGHT THIGH  PROCEDURE:  Procedure(s): EXCISE MASSES OF RIGHT FOREARM AND RIGHT THIGH (Right)  SURGEON:  Surgeon(s) and Role:    Armandina Gemma, MD - Primary  ANESTHESIA:   IV sedation  EBL:  5 mL   BLOOD ADMINISTERED:none  DRAINS: none   LOCAL MEDICATIONS USED:  MARCAINE     SPECIMEN:  Excision  DISPOSITION OF SPECIMEN:  PATHOLOGY  COUNTS:  YES  TOURNIQUET:  * No tourniquets in log *  DICTATION: .Other Dictation: Dictation Number [id number]  PLAN OF CARE: Discharge to home after PACU  PATIENT DISPOSITION:  PACU - hemodynamically stable.   Delay start of Pharmacological VTE agent (>24hrs) due to surgical blood loss or risk of bleeding: yes  Armandina Gemma, MD [facility] Surgery Office: [phone]

## 2017-11-06 NOTE — Discharge Instructions (Signed)
Naproxen,aleve,motrin after 10 pm as needed.  Post Anesthesia Home Care Instructions  Activity: Get plenty of rest for the remainder of the day. A responsible individual must stay with you for 24 hours following the procedure.  For the next 24 hours, DO NOT: -Drive a car -Paediatric nurse -Drink alcoholic beverages -Take any medication unless instructed by your physician -Make any legal decisions or sign important papers.  Meals: Start with liquid foods such as gelatin or soup. Progress to regular foods as tolerated. Avoid greasy, spicy, heavy foods. If nausea and/or vomiting occur, drink only clear liquids until the nausea and/or vomiting subsides. Call your physician if vomiting continues.  Special Instructions/Symptoms: Your throat may feel dry or sore from the anesthesia or the breathing tube placed in your throat during surgery. If this causes discomfort, gargle with warm salt water. The discomfort should disappear within 24 hours.  If you had a scopolamine patch placed behind your ear for the management of post- operative nausea and/or vomiting:  1. The medication in the patch is effective for 72 hours, after which it should be removed.  Wrap patch in a tissue and discard in the trash. Wash hands thoroughly with soap and water. 2. You may remove the patch earlier than 72 hours if you experience unpleasant side effects which may include dry mouth, dizziness or visual disturbances. 3. Avoid touching the patch. Wash your hands with soap and water after contact with the patch.

## 2017-11-06 NOTE — Interval H&P Note (Signed)
History and Physical Interval Note:  11/06/2017 2:59 PM  [name]  has presented today for surgery, with the diagnosis of SOFT TISSUE MASSES RIGHT FOREARM AND RIGHT THIGH.  The various methods of treatment have been discussed with the patient and family. After consideration of risks, benefits and other options for treatment, the patient has consented to    Procedure(s): EXCISE MASSES OF RIGHT FOREARM AND RIGHT THIGH (Right) as a surgical intervention .    The patient's history has been reviewed, patient examined, no change in status, stable for surgery.  I have reviewed the patient's chart and labs.  Questions were answered to the patient's satisfaction.    Armandina Gemma, [place] Surgery Office: [place]

## 2017-11-06 NOTE — Anesthesia Procedure Notes (Signed)
Procedure Name: MAC Date/Time: 11/06/2017 3:12 PM Performed by: Justice Rocher, CRNA Pre-anesthesia Checklist: Patient identified, Timeout performed, Emergency Drugs available, Suction available and Patient being monitored Patient Re-evaluated:Patient Re-evaluated prior to induction Oxygen Delivery Method: Simple face mask Preoxygenation: Pre-oxygenation with 100% oxygen Induction Type: IV induction Placement Confirmation: positive ETCO2 and breath sounds checked- equal and bilateral

## 2017-11-06 NOTE — Anesthesia Preprocedure Evaluation (Signed)
Anesthesia Evaluation  Patient identified by MRN, date of birth, ID band Patient awake    Reviewed: Allergy & Precautions, NPO status , Patient's Chart, lab work & pertinent test results, reviewed documented beta blocker date and time   History of Anesthesia Complications (+) PONVNegative for: history of anesthetic complications  Airway Mallampati: III  TM Distance: >3 FB Neck ROM: Full    Dental no notable dental hx. (+) Dental Advisory Given   Pulmonary sleep apnea , former smoker,    Pulmonary exam normal        Cardiovascular hypertension, Pt. on medications and Pt. on home beta blockers Normal cardiovascular exam     Neuro/Psych  Headaches, PSYCHIATRIC DISORDERS Anxiety Depression    GI/Hepatic GERD  ,  Endo/Other  diabetesMorbid obesity  Renal/GU      Musculoskeletal   Abdominal   Peds  Hematology   Anesthesia Other Findings   Reproductive/Obstetrics                             Anesthesia Physical Anesthesia Plan  ASA: III  Anesthesia Plan: MAC   Post-op Pain Management:    Induction: Intravenous  PONV Risk Score and Plan: 2 and Ondansetron and Propofol infusion  Airway Management Planned: Natural Airway and Simple Face Mask  Additional Equipment:   Intra-op Plan:   Post-operative Plan:   Informed Consent: I have reviewed the patients History and Physical, chart, labs and discussed the procedure including the risks, benefits and alternatives for the proposed anesthesia with the patient or authorized representative who has indicated his/her understanding and acceptance.   Dental advisory given  Plan Discussed with: CRNA and Anesthesiologist  Anesthesia Plan Comments:         Anesthesia Quick Evaluation

## 2017-11-06 NOTE — Telephone Encounter (Signed)
I called patient & verified with her to d/c [place].

## 2017-11-06 NOTE — Transfer of Care (Signed)
Immediate Anesthesia Transfer of Care Note  Patient: [name]  Procedure(s) Performed: Procedure(s) (LRB): EXCISE MASSES OF RIGHT FOREARM AND RIGHT THIGH (Right)  Patient Location: PACU  Anesthesia Type: General  Level of Consciousness: awake, sedated, patient cooperative and responds to stimulation   Airway & Oxygen Therapy: Patient Spontanous Breathing and Patient on RA   Post-op Assessment: Report given to PACU RN, Post -op Vital signs reviewed and stable and Patient moving all extremities  Post vital signs: Reviewed and stable  Complications: No apparent anesthesia complications

## 2017-11-07 ENCOUNTER — Encounter (HOSPITAL_BASED_OUTPATIENT_CLINIC_OR_DEPARTMENT_OTHER): Payer: Self-pay | Admitting: Surgery

## 2017-11-07 NOTE — Op Note (Signed)
NAMEMarland Kitchen  [name], [name]              ACCOUNT NO.:  [id number]  MEDICAL RECORD NO.:  [id number]  LOCATION:                                 FACILITY:  PHYSICIAN:  [name], MD           DATE OF BIRTH:  DATE OF PROCEDURE:  11/06/2017                              OPERATIVE REPORT   PREOPERATIVE DIAGNOSIS:  Soft tissue masses, dorsum, right forearm and anterior right thigh.  POSTOPERATIVE DIAGNOSIS:  Soft tissue masses, dorsum, right forearm and anterior right thigh.  PROCEDURES: 1. Excision, soft tissue mass, dorsum, right forearm, 3 x 2.5 x 1 cm. 2. Excision, soft tissue mass, dorsum, right forearm, 3 x 2 x 1 cm. 3. Excision, soft tissue mass, right anterior thigh, 4.5 x 3 x 1.5 cm. 4. Excision, soft tissue mass, anterior right thigh, 4 x 3 x 1.5 cm.  SURGEON:  [name], MD  ANESTHESIA:  Local with intravenous sedation.  PREPARATION:  ChloraPrep.  COMPLICATIONS:  None.  BLOOD LOSS:  Minimal.  INDICATIONS:  The patient is a 61 year old female with soft tissue masses involving the dorsum of the right forearm and the anterior right thigh.  These are enlarging in size and causing the patient discomfort. She now comes to Surgery for excision for definitive diagnosis and relief of symptoms.  BODY OF REPORT:  Procedure was done in OR #3 at the [facility].  The patient was brought to the operating room, placed in supine position on the operating room table.  Following administration of intravenous sedation, the patient was positioned and then prepped and draped in the usual aseptic fashion.  After ascertaining that an adequate level of anesthesia, adequate level of sedation had been achieved, the skin overlying the masses on the right forearm was anesthetized with local anesthetic.  A longitudinal incision was made with a #15 blade and dissection carried into the subcutaneous tissues. Masses were mobilized with gentle blunt dissection and  vascular structures were divided with the electrocautery.  The largest mass in the dorsum of the right forearm was removed measuring 3 x 2.5 x 1 cm. It was submitted to Pathology for review.  A second mass was removed from the dorsum of the right forearm measuring 3 x 2 x 1 cm and was also submitted to Pathology for review.  Hemostasis was obtained throughout the wound.  Subcutaneous tissues were reapproximated with interrupted 3-0 Vicryl sutures.  Skin was closed with a running 4-0 Monocryl subcuticular suture.  Wound was washed and dried and Steri-Strips were applied.  Sterile dressings were applied. An Ace wrap was applied.  Next, we turned our attention to the right anterior thigh.  The most superior mass was localized.  Skin was anesthetized with local anesthetic.  Incision was made with a #15 blade and carried into the subcutaneous tissues.  The mass was gently dissected out using the electrocautery for hemostasis.  Vascular pedicle was divided with the electrocautery and the entire mass was delivered from the wound.  It measured 4.5 x 3.0 x 1.5 cm and was submitted to Pathology for review. Subcutaneous tissues were closed with interrupted 3-0 Vicryl sutures. Skin was closed  with a running 4-0 Monocryl subcuticular suture.  The more caudad mass was localized.  The skin was anesthetized with local anesthetic.  Incision was made with a #15 blade.  Dissection was carried down to the mass.  Mass was mobilized using the electrocautery for hemostasis.  Vascular pedicle was divided with the electrocautery and the mass was excised in its entirety.  It measured 4 x 3 x 1.5 cm. It was submitted to Pathology.  Subcutaneous tissues were closed with interrupted 3-0 Vicryl sutures.  Skin was closed with a running 3-0 Monocryl subcuticular suture.  Wounds were washed and dried, and Steri- Strips were applied.  Sterile dressings were applied.  The patient was awakened from sedation and taken to  the recovery room.  The patient tolerated the procedure well.   Armandina Gemma, [place] Surgery Office: [phone]    TMG/MEDQ  D:  11/06/2017  T:  11/07/2017  Job:  [id number]

## 2017-11-07 NOTE — Anesthesia Postprocedure Evaluation (Signed)
Anesthesia Post Note  Patient: [name]  Procedure(s) Performed: EXCISE MASSES OF RIGHT FOREARM AND RIGHT THIGH (Right )     Patient location during evaluation: PACU Anesthesia Type: MAC Level of consciousness: awake and alert Pain management: pain level controlled Vital Signs Assessment: post-procedure vital signs reviewed and stable Respiratory status: spontaneous breathing, nonlabored ventilation, respiratory function stable and patient connected to nasal cannula oxygen Cardiovascular status: stable and blood pressure returned to baseline Postop Assessment: no apparent nausea or vomiting Anesthetic complications: no    Last Vitals:  Vitals:   11/06/17 1700 11/06/17 1735  BP: (!) 151/86 (!) 153/73  Pulse: 65 63  Resp: 13   Temp:  36.8 C  SpO2: 98% 99%    Last Pain:  Vitals:   11/07/17 1236  TempSrc:   PainSc: 6                  Miquel Stacks S

## 2017-11-13 ENCOUNTER — Other Ambulatory Visit: Payer: Self-pay | Admitting: Family

## 2017-11-14 NOTE — Progress Notes (Signed)
Please contact patient and notify of benign pathology results.  [name] M. [name], MD, FACS [facility] Office: [phone]   

## 2017-12-05 ENCOUNTER — Ambulatory Visit: Payer: Medicare Other | Admitting: Cardiology

## 2017-12-16 ENCOUNTER — Telehealth: Payer: Self-pay

## 2017-12-16 ENCOUNTER — Other Ambulatory Visit: Payer: Self-pay | Admitting: Family

## 2017-12-16 MED ORDER — SEMAGLUTIDE(0.25 OR 0.5MG/DOS) 2 MG/1.5ML ~~LOC~~ SOPN: 0.5000 mg | PEN_INJECTOR | SUBCUTANEOUS | 5 refills | Status: DC

## 2017-12-16 NOTE — Telephone Encounter (Signed)
Sent!

## 2017-12-16 NOTE — Telephone Encounter (Signed)
Received fax that Trulicity is no longer covered by pts insurance, please advise if PA is required.  KEY: [id number]

## 2017-12-16 NOTE — Telephone Encounter (Signed)
We can try Ozempic 0.5 mg first - 2 pens with 5 refills

## 2017-12-18 NOTE — Telephone Encounter (Signed)
Ozempic is not covered either, please advise

## 2017-12-19 ENCOUNTER — Institutional Professional Consult (permissible substitution): Payer: Medicare Other | Admitting: Pulmonary Disease

## 2017-12-25 ENCOUNTER — Telehealth: Payer: Self-pay | Admitting: Family

## 2017-12-25 NOTE — Telephone Encounter (Signed)
Copied from [place] [id number]. Topic: Quick Communication - See Telephone Encounter >> Dec 25, 2017  2:18 PM Robina Ade, Helene Kelp D wrote: CRM for notification. See Telephone encounter for: 12/25/17. Patient called and said that she would like all her meds to be moved from [facility] [address] [facility]. [place], [place]. Starting today.

## 2017-12-26 ENCOUNTER — Institutional Professional Consult (permissible substitution): Payer: Medicare Other | Admitting: Pulmonary Disease

## 2017-12-26 NOTE — Telephone Encounter (Signed)
Pt is aware and she states that she has been able to fill Trulicity at her pharmacy but will discuss at visit

## 2017-12-26 NOTE — Telephone Encounter (Signed)
Please advise Ozempic is not covered either per pharmacy

## 2017-12-26 NOTE — Telephone Encounter (Signed)
Let's wait for the visit in 1 week and will discuss.

## 2017-12-27 ENCOUNTER — Telehealth: Payer: Self-pay

## 2017-12-27 NOTE — Telephone Encounter (Signed)
Spoke with pt and advised her that transfers of medications are handled between pharmacies. [facility] and [facility] told her to notify us as well. Pharmacy contact has been updated.

## 2017-12-27 NOTE — Telephone Encounter (Signed)
Cody from cover my meds calling about status of PA for Ozempic he can be reached at [phone] reference key is [id number] any agent can help with this

## 2017-12-27 NOTE — Telephone Encounter (Signed)
Pt is on trulicity no PA needed Ozempic is discontinued

## 2017-12-31 ENCOUNTER — Other Ambulatory Visit: Payer: Self-pay

## 2017-12-31 ENCOUNTER — Telehealth: Payer: Self-pay | Admitting: Family

## 2017-12-31 MED ORDER — LOSARTAN POTASSIUM 100 MG PO TABS: 100.0000 mg | ORAL_TABLET | Freq: Every day | ORAL | 1 refills | Status: DC

## 2017-12-31 NOTE — Telephone Encounter (Signed)
Copied from [place] [id number]. Topic: Quick Communication - Rx Refill/Question >> Dec 31, 2017 10:52 AM Cleaster Corin, NT wrote: Medication: losartan   Has the patient contacted their pharmacy? yes  (Agent: If no, request that the patient contact the pharmacy for the refill.)   Preferred Pharmacy (with phone number or street name): [id number] [place], [place] [address]. AT [address]. [place] [address] Phone: [phone] Fax: [phone]     Agent: Please be advised that RX refills may take up to 3 business days. We ask that you follow-up with your pharmacy.

## 2018-01-02 ENCOUNTER — Ambulatory Visit: Payer: Medicare Other | Admitting: Internal Medicine

## 2018-01-07 ENCOUNTER — Ambulatory Visit: Payer: Medicare Other | Admitting: Cardiology

## 2018-01-22 ENCOUNTER — Ambulatory Visit (INDEPENDENT_AMBULATORY_CARE_PROVIDER_SITE_OTHER): Payer: Medicare Other | Admitting: Family

## 2018-01-22 ENCOUNTER — Other Ambulatory Visit: Payer: Self-pay | Admitting: Internal Medicine

## 2018-01-22 ENCOUNTER — Encounter: Payer: Self-pay | Admitting: Family

## 2018-01-22 VITALS — BP 109/85 | HR 67 | Temp 97.7°F | Resp 16 | Ht 65.5 in | Wt 245.4 lb

## 2018-01-22 DIAGNOSIS — J329 Chronic sinusitis, unspecified: Secondary | ICD-10-CM

## 2018-01-22 MED ORDER — AMOXICILLIN-POT CLAVULANATE 875-125 MG PO TABS: 1.0000 | ORAL_TABLET | Freq: Two times a day (BID) | ORAL | 0 refills | Status: DC

## 2018-01-22 MED ORDER — BENZONATATE 100 MG PO CAPS: 100.0000 mg | ORAL_CAPSULE | Freq: Three times a day (TID) | ORAL | 0 refills | Status: DC | PRN

## 2018-01-22 NOTE — Patient Instructions (Addendum)
Please begin augmentin twice daily.  Add tessalon as needed for cough.  Call if new/worsening symptoms or if symptoms are not improved in 3-4 days.

## 2018-01-22 NOTE — Progress Notes (Signed)
Subjective:    Patient ID: [name], female    DOB: 02/26/1956, 62 y.o.   MRN: [id number]  HPI  Patient is a 62 yr old female who presents today with c/o weakness, cough, dizziness.  "like everything is spinning. Coughing up small amount of mucous. Denies fever. Reports some cramping in her hands and feet.  Has been taking potassium. Denies otalgia or sore throat. Started 1 week ago. Used benadryl and otc cold medicine.    Review of Systems See HPI  Past Medical History:  Diagnosis Date  . Anxiety   . Arthritis    knees  . Chronic fatigue syndrome   . Chronic pain syndrome    pain clinic w/ [facility]  . Dyslipidemia   . Fibromyalgia   . GERD (gastroesophageal reflux disease)   . History of transient ischemic attack (TIA) 08/2012   per pt no residual  . Hypertension   . IBS (irritable bowel syndrome)   . IC (interstitial cystitis) urologist--- dr Alona Bene [facility]  . Major depression   . Migraine   . Mild obstructive sleep apnea    per study 2005  mild osa, last used cpap 2016,  per pt needs another sleep study   . Mixed incontinence urge and stress   . Multiple thyroid nodules    bilateral --- hx benign bx 2010  . Osteopenia   . PONV (postoperative nausea and vomiting)   . Restless leg syndrome   . Soft tissue mass    right forearm and right thigh  . Type 2 diabetes mellitus ([place])    last A1c 7.8 on 07-15-2017 in epic  . Wears glasses      Social History   Socioeconomic History  . Marital status: Divorced    Spouse name: Not on file  . Number of children: 2  . Years of education: Not on file  . Highest education level: Not on file  Social Needs  . Financial resource strain: Not on file  . Food insecurity - worry: Not on file  . Food insecurity - inability: Not on file  . Transportation needs - medical: Not on file  . Transportation needs - non-medical: Not on file  Occupational History  . Occupation: disabled  Tobacco Use  . Smoking status: Former  Smoker    Packs/day: 0.10    Years: 1.00    Pack years: 0.10    Types: Cigarettes    Last attempt to quit: 10/19/2012    Years since quitting: 5.2  . Smokeless tobacco: Never Used  Substance and Sexual Activity  . Alcohol use: No    Frequency: Never    Comment: rare  . Drug use: No  . Sexual activity: No  Other Topics Concern  . Not on file  Social History Narrative   Disabled   Divorced, lives alone - 1 dtr, 1 son    Past Surgical History:  Procedure Laterality Date  . ABDOMINAL EXPLORATION SURGERY  1983   MECKEL diverticulum removed and APPENDECTOMY and hemicolectomy  . ANTERIOR CERVICAL DECOMP/DISCECTOMY FUSION  01-04-2009   dr pool  [facility]  . BREAST CYST EXCISION Left 1978   benign  . CARDIOVASCULAR STRESS TEST  03/17/2013   normal nuclear study w/ no ischemia/  normal LV function and wall motion , ef 62%  . COLONOSCOPY  last one 08-14-2011  . DIAGNOSTIC LAPAROSCOPY  01-18-2004  dr Radene Knee; 1996; 1995  . ESOPHAGOGASTRODUODENOSCOPY  last one 09-03-2013   dr Candie Echevaria  .  EXCISION LIPOMA LEFT THIGH  04-03-2004  dr Margot Chimes  . EXPLORATORY LAPAROTOMY W/ RIGHT OVARIAN CYSTECTOMY AND LYSIS ADHESIONS  1982  . LAPAROSCOPIC ASSISTED VAGINAL HYSTERECTOMY  1992  . LIPOMA EXCISION  05-16-2001;  1998;  [place]  . MASS EXCISION Right 11/06/2017   Procedure: EXCISE MASSES OF RIGHT FOREARM AND RIGHT THIGH;  Surgeon: Armandina Gemma, MD;  Location: [place];  Service: General;  Laterality: Right;  . MULTIPLE CYSTO/  HYDRODISTENTION/ INSTILLATION THERAPY  since 2001;  last one 08/ 2018   dr Amalia Hailey  [facility]  . NASAL SINUS SURGERY  2004  . RECTOCELE REPAIR  07/2008  . TONSILLECTOMY AND ADENOIDECTOMY  1970  . TOTAL KNEE ARTHROPLASTY Bilateral left 07-31-2010/  right 08-29-2010   dr Alvan Dame  [facility]  . TRANSTHORACIC ECHOCARDIOGRAM  08/23/2012   normal  w/ ef 60-65%  . VENTRAL HERNIA REPAIR  06-05-2006 dr Excell Seltzer [facility];  2001; 1992    Family History  Problem Relation Age of Onset  .  Breast cancer Mother   . Dementia Mother   . Diabetes Father   . Dementia Father   . Alzheimer's disease Father   . Breast cancer Maternal Grandmother   . Throat cancer Maternal Grandmother   . Esophageal cancer Maternal Grandmother   . Breast cancer Other        maternal great aunts x 5  . Rheum arthritis Other   . Diabetes Maternal Grandfather   . Colon cancer Neg Hx     Allergies  Allergen Reactions  . Nsaids Nausea And Vomiting and Swelling    Worsening symptoms- interstitial cystitist  . Atorvastatin     Body aches, worsened interstitial cystitis.  . Buprenorphine Hcl Nausea And Vomiting  . Chlorzoxazone Other (See Comments) and Nausea Only    Unknown reaction  . Darvon [Propoxyphene] Other (See Comments)    hallucinations  . Gabapentin Nausea And Vomiting  . Gemfibrozil Nausea And Vomiting  . Metronidazole Nausea And Vomiting  . Morphine And Related Nausea And Vomiting  . Nitrofurantoin Nausea And Vomiting  . Olmesartan Medoxomil-Hctz Nausea And Vomiting  . Ondansetron Nausea And Vomiting  . Pentosan Polysulfate Sodium Other (See Comments)    Unknown reaction  . Pravastatin Other (See Comments)    Body aches, worsening interstitial cystitis.  . Ramipril Cough  . Sulfa Antibiotics Nausea And Vomiting  . Elmiron [Pentosan Polysulfate] Nausea And Vomiting and Rash    Current Outpatient Medications on File Prior to Visit  Medication Sig Dispense Refill  . ALPRAZolam (XANAX) 1 MG tablet Take 1 tablet (1 mg total) by mouth 3 (three) times daily as needed for anxiety. (Patient taking differently: Take 1 mg by mouth 3 (three) times daily as needed for anxiety. ) 30 tablet 0  . amLODipine (NORVASC) 5 MG tablet Take 1 tablet (5 mg total) by mouth daily. (Patient taking differently: Take 5 mg by mouth every evening. ) 90 tablet 3  . aspirin EC 325 MG tablet Take 325 mg by mouth daily.    . benzonatate (TESSALON) 100 MG capsule Take 1 capsule (100 mg total) by mouth 3 (three)  times daily as needed. (Patient taking differently: Take 100 mg by mouth 3 (three) times daily as needed. ) 20 capsule 0  . bismuth subsalicylate (PEPTO-BISMOL) 262 MG/15ML suspension Take 30 mLs by mouth every 6 (six) hours as needed. 360 mL 0  . desvenlafaxine (PRISTIQ) 100 MG 24 hr tablet TK 1 T PO D--- takes in am  0  . Diclofenac Sodium (PENNSAID) 2 % SOLN Place 1 application onto the skin at bedtime as needed (knee pain).    Marland Kitchen dicyclomine (BENTYL) 20 MG tablet TAKE 1 TABLET(20 MG) BY MOUTH EVERY 6 HOURS 30 tablet 0  . Dulaglutide (TRULICITY) 1.5 XT/0.6YI SOPN Inject into skin weekly (Patient taking differently: Inject 0.75 mg as directed once a week. Inject into skin weekly---  Sunday's) 4 pen 1  . esomeprazole (NEXIUM) 40 MG capsule TAKE 1 CAPSULE BY MOUTH EVERY DAY AT NOON 90 capsule 1  . fentaNYL (DURAGESIC - DOSED MCG/HR) 75 MCG/HR Place 1 patch onto the skin every other day.  0  . furosemide (LASIX) 20 MG tablet TAKE 1 TABLET BY MOUTH DAILY (Patient taking differently: TAKE 1 TABLET BY MOUTH DAILY---  takes prn for fluid retention) 30 tablet 3  . glucose blood (ONE TOUCH ULTRA TEST) test strip USE TO TEST AS DIRECTED 100 each 1  . hydrALAZINE (APRESOLINE) 25 MG tablet TAKE 1 TABLET(25 MG) BY MOUTH THREE TIMES DAILY (Patient taking differently: TAKE 1 TABLET(25 MG) BY MOUTH THREE TIMES DAILY  (for IC)) 90 tablet 5  . INVOKANA 300 MG TABS tablet TAKE 1 TABLET(300 MG) BY MOUTH DAILY BEFORE BREAKFAST 30 tablet 5  . JANUVIA 100 MG tablet TAKE 1 TABLET BY MOUTH DAILY WITH BREAKFAST 90 tablet 1  . lidocaine (LIDODERM) 5 % Place 2 patches onto the skin at bedtime as needed (hip pain).     Marland Kitchen losartan (COZAAR) 100 MG tablet Take 1 tablet (100 mg total) by mouth daily. 90 tablet 1  . metoprolol succinate (TOPROL-XL) 100 MG 24 hr tablet TAKE 1 TABLET BY MOUTH DAILY, TAKE WITH OR IMMEDIATELY FOLLOWING A MEAL 90 tablet 1  . mirabegron ER (MYRBETRIQ) 50 MG TB24 Take 50 mg by mouth at bedtime.     Marland Kitchen  nystatin cream (MYCOSTATIN) Apply 1 application topically 2 (two) times daily. 30 g 2  . Omega-3 Fatty Acids (FISH OIL) 1000 MG CAPS Take 1,000 mg by mouth daily.    Glory Rosebush DELICA LANCETS 94W MISC TEST BLOOD SUGAR 1 TO 2 TIMES DAILY 100 each 2  . Oxycodone HCl 10 MG TABS Take 10 mg by mouth every 8 (eight) hours as needed.   0  . pioglitazone (ACTOS) 15 MG tablet TAKE 1 TABLET(15 MG) BY MOUTH DAILY (Patient taking differently: TAKE 1 TABLET(15 MG) BY MOUTH DAILY---  takes in am) 30 tablet 5  . potassium gluconate 595 MG TABS tablet Take 595 mg by mouth daily as needed (leg cramps).    . promethazine (PHENERGAN) 25 MG tablet Take 25 mg by mouth daily as needed for nausea or vomiting.     Marland Kitchen rOPINIRole (REQUIP) 4 MG tablet TAKE 1 TABLET(4 MG) BY MOUTH AT BEDTIME 90 tablet 1  . Semaglutide (OZEMPIC) 0.25 or 0.5 MG/DOSE SOPN Inject 0.5 mg into the skin once a week. 2 pen 5  . tiZANidine (ZANAFLEX) 4 MG tablet Take 1 tablet (4 mg total) by mouth at bedtime. 30 tablet 0  . topiramate (TOPAMAX) 50 MG tablet Take 1 tablet (50 mg total) by mouth 2 (two) times daily as needed (headaches). 60 tablet 3  . traZODone (DESYREL) 100 MG tablet Take 100-300 mg by mouth at bedtime as needed. Take 2-3 tablets at bedtime as needed.    . valACYclovir (VALTREX) 1000 MG tablet Take 1 tablet (1,000 mg total) by mouth 3 (three) times daily. (Patient taking differently: Take 1,000 mg by mouth 3 (three)  times daily. ) 30 tablet 0  . [DISCONTINUED] pravastatin (PRAVACHOL) 20 MG tablet Take 1 tablet (20 mg total) by mouth daily. 30 tablet 3   No current facility-administered medications on file prior to visit.     BP 109/85 (BP Location: Right Arm, Patient Position: Sitting, Cuff Size: Large)   Pulse 67   Temp 97.7 F (36.5 C) (Oral)   Resp 16   Ht 5' 5.5" (1.664 m)   Wt 245 lb 6.4 oz (111.3 kg)   SpO2 95%   BMI 40.22 kg/m       Objective:   Physical Exam  Constitutional: She appears well-developed and  well-nourished.  HENT:  Head: Normocephalic and atraumatic.  Right Ear: Tympanic membrane and ear canal normal.  Left Ear: Tympanic membrane and ear canal normal.  Nose: Right sinus exhibits maxillary sinus tenderness. Right sinus exhibits no frontal sinus tenderness. Left sinus exhibits maxillary sinus tenderness. Left sinus exhibits no frontal sinus tenderness.  Mouth/Throat: No oropharyngeal exudate, posterior oropharyngeal edema or posterior oropharyngeal erythema.  Cardiovascular: Normal rate, regular rhythm and normal heart sounds.  No murmur heard. Pulmonary/Chest: Effort normal and breath sounds normal. No respiratory distress. She has no wheezes.  Psychiatric: She has a normal mood and affect. Her behavior is normal. Judgment and thought content normal.          Assessment & Plan:  Sinusitis- Symptoms most consistent with acute sinusitis-we will Augmentin twice daily for 10 days. I have sent a prescription for Tessalon as needed for cough  In addition the patient is advised to call if new or worsening symptoms or if symptoms are not improved in 3-4 days.

## 2018-01-23 ENCOUNTER — Other Ambulatory Visit: Payer: Self-pay

## 2018-01-23 MED ORDER — DULAGLUTIDE 1.5 MG/0.5ML ~~LOC~~ SOAJ: SUBCUTANEOUS | 1 refills | Status: DC

## 2018-01-24 ENCOUNTER — Telehealth: Payer: Self-pay | Admitting: Family

## 2018-01-24 NOTE — Telephone Encounter (Signed)
Spoke to patient. Pt states she p/u Trulicity .75mg  on 03/06. Advised pt Trulicity 1.5mg  was rx'd on 03/07. Pt will call pharmacy to see what can be done about p/u new med.

## 2018-01-24 NOTE — Telephone Encounter (Signed)
Pt called [facility] call center Stating her medication was called in wrong Tried calling pt no answer

## 2018-01-24 NOTE — Telephone Encounter (Signed)
Pt stated pharmacy would not take med back, and she would have to pay for the new Rx. Advised pt ok to double Trulicity .75mg  per Dr. Cruzita Lederer. Pt was grateful.

## 2018-01-27 ENCOUNTER — Other Ambulatory Visit: Payer: Self-pay | Admitting: *Deleted

## 2018-01-27 NOTE — Telephone Encounter (Signed)
[facility]: invokana, pioglitazone and januvia. Pt currently seeing Dr Cruzita Lederer.

## 2018-01-28 ENCOUNTER — Ambulatory Visit (INDEPENDENT_AMBULATORY_CARE_PROVIDER_SITE_OTHER): Payer: Medicare Other | Admitting: Cardiology

## 2018-01-28 ENCOUNTER — Other Ambulatory Visit: Payer: Self-pay

## 2018-01-28 ENCOUNTER — Encounter: Payer: Self-pay | Admitting: Cardiology

## 2018-01-28 VITALS — BP 131/74 | HR 96 | Ht 65.0 in | Wt 258.0 lb

## 2018-01-28 DIAGNOSIS — E785 Hyperlipidemia, unspecified: Secondary | ICD-10-CM

## 2018-01-28 DIAGNOSIS — G4733 Obstructive sleep apnea (adult) (pediatric): Secondary | ICD-10-CM | POA: Diagnosis not present

## 2018-01-28 DIAGNOSIS — Z79899 Other long term (current) drug therapy: Secondary | ICD-10-CM

## 2018-01-28 DIAGNOSIS — E1169 Type 2 diabetes mellitus with other specified complication: Secondary | ICD-10-CM | POA: Diagnosis not present

## 2018-01-28 DIAGNOSIS — I10 Essential (primary) hypertension: Secondary | ICD-10-CM

## 2018-01-28 MED ORDER — FISH OIL 1000 MG PO CAPS: 3000.0000 mg | ORAL_CAPSULE | Freq: Every day | ORAL | 11 refills | Status: AC

## 2018-01-28 MED ORDER — EZETIMIBE 10 MG PO TABS: 10.0000 mg | ORAL_TABLET | Freq: Every day | ORAL | 3 refills | Status: DC

## 2018-01-28 NOTE — Patient Instructions (Signed)
MEDICATION INSTRUCTION   ZETIA 10 MG ONE TABLET DAILY  START WITH EVERY OTHER DAY FOR 12 DAYS THEN EVERY DAY  INCREASE FISH OIL  TO 1000 MG THREE TIMES A DAY   ( YOU EITHER TAKE ALL TOGETHER OR THREE TIMES A DAY   LABS IN 3 MONTHS   LIPID , CMP  WILL MAIL YOU LABSLIP IN 2 MONTHS    Your physician recommends that you schedule a follow-up appointment in [place].   If you need a refill on your cardiac medications before your next appointment, please call your pharmacy.

## 2018-01-28 NOTE — Progress Notes (Signed)
PCP: [name], NP  Clinic Note: Chief Complaint  Patient presents with  . New Patient (Initial Visit)  . Shortness of Breath    occasionally.  . Leg Pain    cramping in legs  . Hand Pain    cramping in hands.    HPI: [name] is a 62 y.o. female who is being seen today for the evaluation & treatment of Hyperlipidemia at the request of [name], NP.  She has a diagnosis of hypertension, hyperlipidemia, diabetes mellitus, type II and history of TIA. She also has chronic interstitial cystitis, restless leg and fibromyalgia. She also has pretty significant anxiety (had to keep the door open, like to stand up during the exam as opposed to sitting down) She has been intolerant of statins indicating that they exacerbate her interstitial cystitis so she refuses to take statins (she has tried atorvastatin, simvastatin and pravastatin.. She is also tried gemfibrozil which causes nausea and vomiting.  [name] was last seen by [name], NP on March 6. She was noting vertigo dizziness with scant coughing. Also noted cramping in her hands and feet. --> Diagnosis sinusitis and started on antibiotics.  Recent Hospitalizations:  -- 11/06/2017 outpatient resection of right upper showed a mass.  Studies Personally Reviewed - (if available, images/films reviewed: From Epic Chart or Care Everywhere)  Transthoracic Echo October 2013: Normal LV size and function. EF 60-65%. No RWMA. No PFO noted. Normal valves.  Interval History: [name] presents today as far she knows to discuss her cholesterol. She indicates that all statins have had intolerable reactions with her exacerbating her interstitial cystitis. She denied any myalgias or arthralgias with them. She really doesn't know much about her family's medical history, but of what she does note, there wasn't any significant CAD or lipid issues. She is a very difficult historian and doesn't really have much the  way of any significant symptoms. She has mild edema for which she takes as needed Lasix. She really is limited as far as any activity had bilateral knee replacement with significant pain. She does not routinely exercise and is on chronic pain medications. She really denies any cardiac symptoms become: No chest tightness or pressure with rest or exertion to the extent that she would exert. She is deconditioned and therefore will get short of breath if she does any notable exertion. She denies any PND or orthopnea. Trivial edema.  No palpitations, lightheadedness, dizziness, weakness or syncope/near syncope. No TIA/amaurosis fugax symptoms. No melena, hematochezia, hematuria, or epstaxis. No claudication.  ROS: A comprehensive was performed. Review of Systems  Constitutional: Positive for malaise/fatigue (Dealing with current congestion and sinusitis issues). Negative for chills and fever.  HENT: Positive for congestion and sinus pain. Negative for nosebleeds and sore throat.   Respiratory: Positive for cough (From postnasal drip).   Cardiovascular: Positive for claudication. Negative for leg swelling.  Gastrointestinal: Negative for blood in stool, constipation, diarrhea, heartburn and melena.  Genitourinary: Positive for dysuria and urgency. Negative for hematuria.  Musculoskeletal: Positive for joint pain (Both knees still hurt along with hip pain depending on which knees bothering her.). Negative for falls and myalgias.  Neurological: Negative for dizziness.  Endo/Heme/Allergies: Positive for environmental allergies.  Psychiatric/Behavioral: Positive for depression (By exam). Negative for memory loss. The patient is nervous/anxious and has insomnia.        She clearly has underlying psych issues is very anxious and tentative. Very much on edge.  All other systems reviewed and are  negative.    I have reviewed and (if needed) personally updated the patient's problem list, medications,  allergies, past medical and surgical history, social and family history.   Past Medical History:  Diagnosis Date  . Anxiety   . Arthritis    knees  . Chronic fatigue syndrome   . Chronic pain syndrome    [facility] clinic w/ [facility]  . Dyslipidemia   . Fibromyalgia   . GERD (gastroesophageal reflux disease)   . History of transient ischemic attack (TIA) 08/2012   per pt no residual  . Hypertension   . IBS (irritable bowel syndrome)   . IC (interstitial cystitis) urologist--- dr Alona Bene [facility]  . Major depression   . Migraine   . Mild obstructive sleep apnea    per study 2005  mild osa, last used cpap 2016,  per pt needs another sleep study   . Mixed incontinence urge and stress   . Multiple thyroid nodules    bilateral --- hx benign bx 2010  . OSA (obstructive sleep apnea)    Has pending sleep study consult for April  . Osteopenia   . PONV (postoperative nausea and vomiting)   . Restless leg syndrome   . Soft tissue mass    right forearm and right thigh  . Type 2 diabetes mellitus ([place])    last A1c 7.8 on 07-15-2017 in epic  . Wears glasses     Past Surgical History:  Procedure Laterality Date  . ABDOMINAL EXPLORATION SURGERY  1983   MECKEL diverticulum removed and APPENDECTOMY and hemicolectomy  . ANTERIOR CERVICAL DECOMP/DISCECTOMY FUSION  01-04-2009   dr pool  [facility]  . BREAST CYST EXCISION Left 1978   benign  . CARDIOVASCULAR STRESS TEST  03/17/2013   normal nuclear study w/ no ischemia/  normal LV function and wall motion , ef 62%  . COLONOSCOPY  last one 08-14-2011  . DIAGNOSTIC LAPAROSCOPY  01-18-2004  dr Radene Knee; 1996; 1995  . ESOPHAGOGASTRODUODENOSCOPY  last one 09-03-2013   dr Candie Echevaria  . EXCISION LIPOMA LEFT THIGH  04-03-2004  dr Margot Chimes  . EXPLORATORY LAPAROTOMY W/ RIGHT OVARIAN CYSTECTOMY AND LYSIS ADHESIONS  1982  . LAPAROSCOPIC ASSISTED VAGINAL HYSTERECTOMY  1992  . LIPOMA EXCISION  05-16-2001;  1998;  [place]  . MASS EXCISION Right 11/06/2017    Procedure: EXCISE MASSES OF RIGHT FOREARM AND RIGHT THIGH;  Surgeon: Armandina Gemma, MD;  Location: [place];  Service: General;  Laterality: Right;  . MULTIPLE CYSTO/  HYDRODISTENTION/ INSTILLATION THERAPY  since 2001;  last one 08/ 2018   dr Amalia Hailey  [facility]  . NASAL SINUS SURGERY  2004  . RECTOCELE REPAIR  07/2008  . TONSILLECTOMY AND ADENOIDECTOMY  1970  . TOTAL KNEE ARTHROPLASTY Bilateral left 07-31-2010/  right 08-29-2010   dr Al[facility]  . TRANSTHORACIC ECHOCARDIOGRAM  08/23/2012   normal  w/ ef 60-65%  . VENTRAL HERNIA REPAIR  06-05-2006 dr Excell [facility];  2001; 1992    Current Meds  Medication Sig  . ALPRAZolam (XANAX) 1 MG tablet Take 1 tablet (1 mg total) by mouth 3 (three) times daily as needed for anxiety. (Patient taking differently: Take 1 mg by mouth 3 (three) times daily as needed for anxiety. )  . amLODipine (NORVASC) 5 MG tablet Take 1 tablet (5 mg total) by mouth daily. (Patient taking differently: Take 5 mg by mouth every evening. )  . amoxicillin-clavulanate (AUGMENTIN) 875-125 MG tablet Take 1 tablet by mouth 2 (  two) times daily.  Marland Kitchen aspirin EC 325 MG tablet Take 325 mg by mouth daily.  . benzonatate (TESSALON) 100 MG capsule Take 1 capsule (100 mg total) by mouth 3 (three) times daily as needed. (Patient taking differently: Take 100 mg by mouth 3 (three) times daily as needed. )  . benzonatate (TESSALON) 100 MG capsule Take 1 capsule (100 mg total) by mouth 3 (three) times daily as needed.  . bismuth subsalicylate (PEPTO-BISMOL) 262 MG/15ML suspension Take 30 mLs by mouth every 6 (six) hours as needed.  . desvenlafaxine (PRISTIQ) 100 MG 24 hr tablet TK 1 T PO D--- takes in am  . Diclofenac Sodium (PENNSAID) 2 % SOLN Place 1 application onto the skin at bedtime as needed (knee pain).  Marland Kitchen dicyclomine (BENTYL) 20 MG tablet TAKE 1 TABLET(20 MG) BY MOUTH EVERY 6 HOURS  . Dulaglutide (TRULICITY) 1.5 GE/9.5MW SOPN Inject into skin weekly  . esomeprazole (NEXIUM) 40  MG capsule TAKE 1 CAPSULE BY MOUTH EVERY DAY AT NOON  . fentaNYL (DURAGESIC - DOSED MCG/HR) 75 MCG/HR Place 1 patch onto the skin every other day.  . furosemide (LASIX) 20 MG tablet TAKE 1 TABLET BY MOUTH DAILY (Patient taking differently: TAKE 1 TABLET BY MOUTH DAILY---  takes prn for fluid retention)  . glucose blood (ONE TOUCH ULTRA TEST) test strip USE TO TEST AS DIRECTED  . hydrALAZINE (APRESOLINE) 25 MG tablet TAKE 1 TABLET(25 MG) BY MOUTH THREE TIMES DAILY (Patient taking differently: TAKE 1 TABLET(25 MG) BY MOUTH THREE TIMES DAILY  (for IC))  . lidocaine (LIDODERM) 5 % Place 2 patches onto the skin at bedtime as needed (hip pain).   Marland Kitchen losartan (COZAAR) 100 MG tablet Take 1 tablet (100 mg total) by mouth daily.  . metoprolol succinate (TOPROL-XL) 100 MG 24 hr tablet TAKE 1 TABLET BY MOUTH DAILY, TAKE WITH OR IMMEDIATELY FOLLOWING A MEAL  . mirabegron ER (MYRBETRIQ) 50 MG TB24 Take 50 mg by mouth at bedtime.   Marland Kitchen nystatin cream (MYCOSTATIN) Apply 1 application topically 2 (two) times daily.  . Omega-3 Fatty Acids (FISH OIL) 1000 MG CAPS Take 3 capsules (3,000 mg total) by mouth daily.  Glory Rosebush DELICA LANCETS 41L MISC TEST BLOOD SUGAR 1 TO 2 TIMES DAILY  . Oxycodone HCl 10 MG TABS Take 10 mg by mouth every 8 (eight) hours as needed.   . potassium gluconate 595 MG TABS tablet Take 595 mg by mouth daily as needed (leg cramps).  . promethazine (PHENERGAN) 25 MG tablet Take 25 mg by mouth daily as needed for nausea or vomiting.   Marland Kitchen rOPINIRole (REQUIP) 4 MG tablet TAKE 1 TABLET(4 MG) BY MOUTH AT BEDTIME  . Semaglutide (OZEMPIC) 0.25 or 0.5 MG/DOSE SOPN Inject 0.5 mg into the skin once a week.  Marland Kitchen tiZANidine (ZANAFLEX) 4 MG tablet Take 1 tablet (4 mg total) by mouth at bedtime.  . topiramate (TOPAMAX) 50 MG tablet Take 1 tablet (50 mg total) by mouth 2 (two) times daily as needed (headaches).  . traZODone (DESYREL) 100 MG tablet Take 100-300 mg by mouth at bedtime as needed. Take 2-3 tablets at  bedtime as needed.  . valACYclovir (VALTREX) 1000 MG tablet Take 1 tablet (1,000 mg total) by mouth 3 (three) times daily. (Patient taking differently: Take 1,000 mg by mouth 3 (three) times daily. )  . [DISCONTINUED] INVOKANA 300 MG TABS tablet TAKE 1 TABLET(300 MG) BY MOUTH DAILY BEFORE BREAKFAST  . [DISCONTINUED] JANUVIA 100 MG tablet TAKE 1 TABLET BY MOUTH  DAILY WITH BREAKFAST  . [DISCONTINUED] Omega-3 Fatty Acids (FISH OIL) 1000 MG CAPS Take 1,000 mg by mouth daily.  . [DISCONTINUED] pioglitazone (ACTOS) 15 MG tablet TAKE 1 TABLET(15 MG) BY MOUTH DAILY (Patient taking differently: TAKE 1 TABLET(15 MG) BY MOUTH DAILY---  takes in am)  . [DISCONTINUED] pravastatin (PRAVACHOL) 20 MG tablet Take 1 tablet (20 mg total) by mouth daily.    Allergies  Allergen Reactions  . Nsaids Nausea And Vomiting and Swelling    Worsening symptoms- interstitial cystitist  . Atorvastatin     Body aches, worsened interstitial cystitis. Also simvastatin  . Buprenorphine Hcl Nausea And Vomiting  . Chlorzoxazone Other (See Comments) and Nausea Only    Unknown reaction  . Darvon [Propoxyphene] Other (See Comments)    hallucinations  . Gabapentin Nausea And Vomiting  . Gemfibrozil Nausea And Vomiting  . Metronidazole Nausea And Vomiting  . Morphine And Related Nausea And Vomiting  . Nitrofurantoin Nausea And Vomiting  . Olmesartan Medoxomil-Hctz Nausea And Vomiting  . Ondansetron Nausea And Vomiting  . Pentosan Polysulfate Sodium Other (See Comments)    Unknown reaction  . Pravastatin Other (See Comments)    Body aches, worsening interstitial cystitis.  . Ramipril Cough  . Sulfa Antibiotics Nausea And Vomiting  . Elmiron [Pentosan Polysulfate] Nausea And Vomiting and Rash    Social History   Tobacco Use  . Smoking status: Former Smoker    Packs/day: 0.10    Years: 1.00    Pack years: 0.10    Types: Cigarettes    Last attempt to quit: 10/19/2012    Years since quitting: 5.2  . Smokeless tobacco:  Never Used  Substance Use Topics  . Alcohol use: No    Frequency: Never    Comment: rare - 4-5 drinks a year  . Drug use: No   Social History   Social History Narrative   Disabled   Divorced, lives alone with a cat. -    Children: 1 dtr, 1 son, 4 grandchildren      She takes fentanyl and oxycodone for pain. Does not exercise because of being disabled.    family history includes Alzheimer's disease in her father; Breast cancer in her maternal grandmother, mother, and other; Dementia in her father and mother; Diabetes in her father and maternal grandfather; Esophageal cancer in her maternal grandmother; Rheum arthritis in her other; Throat cancer in her maternal grandmother.  Wt Readings from Last 3 Encounters:  01/28/18 258 lb (117 kg)  01/22/18 245 lb 6.4 oz (111.3 kg)  11/06/17 260 lb 11.2 oz (118.3 kg)    PHYSICAL EXAM BP 131/74   Pulse 96   Ht 5\' 5"  (1.651 m)   Wt 258 lb (117 kg)   BMI 42.93 kg/m  Physical Exam  Constitutional: She is oriented to person, place, and time. She appears well-developed and well-nourished. No distress.  Morbidly obese  HENT:  Head: Normocephalic and atraumatic.  Eyes: Conjunctivae and EOM are normal. Pupils are equal, round, and reactive to light. No scleral icterus.  Neck: No hepatojugular reflux and no JVD present. Carotid bruit is not present.  Cardiovascular: Normal rate, regular rhythm, S1 normal, S2 normal, intact distal pulses and normal pulses.  Occasional extrasystoles are present. PMI is not displaced. Exam reveals distant heart sounds. Exam reveals no gallop and no friction rub.  No murmur heard. Pulmonary/Chest: Effort normal and breath sounds normal. No respiratory distress. She has no wheezes. She has no rales.  Abdominal: Soft. Bowel sounds  are normal. She exhibits no distension. There is no tenderness. There is no rebound.  Musculoskeletal: Normal range of motion. She exhibits edema (Trivial bilateral lower summary).    Neurological: She is alert and oriented to person, place, and time. No cranial nerve deficit.  Skin: Skin is warm and dry.  Psychiatric: Judgment and thought content normal.  Very anxious appearing. Standing in the room. Very much abilities. Poor historian.  Nursing note and vitals reviewed.    Adult ECG Report  Rate: 84 ;  Rhythm: normal sinus rhythm and Normal axis, intervals and durations.;   Narrative Interpretation: Normal EKG   Other studies Reviewed: Additional studies/ records that were reviewed today include:  Recent Labs:   Lab Results  Component Value Date   CHOL 214 (H) 11/23/2016   HDL 61.70 11/23/2016   LDLCALC 131 (H) 11/23/2016   LDLDIRECT 136.5 10/11/2009   TRIG 108.0 11/23/2016   CHOLHDL 3 11/23/2016    ASSESSMENT / PLAN: Problem List Items Addressed This Visit    OSA (obstructive sleep apnea) (Chronic)   Relevant Orders   EKG 12-Lead (Completed)   Morbid obesity (HCC) (Chronic)    Actually the process of OSA evaluation. Clearly weight loss with diet next size would be beneficial for combination of metabolic syndrome component's: Lipid control,, hypertension and diabetes.  Unfortunately, it seems that she is probably disabled and it would be difficult for her to exercise. We talked about the importance of dietary modification. In the future we would consider the possibility of doing water exercises/aerobics.      Hyperlipidemia associated with type 2 diabetes mellitus ([place]) - Primary (Chronic)    Actually her lipids aren't that bad with total cholesterol 214 and LDL 131.  She basally has the metabolic syndrome with morbid obesity, diabetes and hypertension - the HDL of 61 and regular is 108 do not correlate with minimal syndrome however.  In the setting of these risk factors however, target LDL should be less than 100 if not closer 70. She has tried many statins and has had intolerance because of exacerbation of her interstitial cystitis. For now we'll  start with Zetia and reassess lipids in about 3 months.  I've also recommended that she increase her fish oil to a total of 3000 mg a day.  Depending on how she does with Zetia, we may consider Bempedoic Acid as an alternative.      Relevant Medications   ezetimibe (ZETIA) 10 MG tablet   Other Relevant Orders   EKG 12-Lead (Completed)   Lipid panel   Comprehensive metabolic panel   Lipid panel   Comprehensive metabolic panel   Essential hypertension (Chronic)    BP stable on 100 mg Toprol and 5 mg of amlodipine plus hydralazine 25 mg 3 times a day. Currently managed by PCP. She has an intolerance listed to ARB is an ACE inhibitor - using hydralazine instead.      Relevant Medications   ezetimibe (ZETIA) 10 MG tablet   Other Relevant Orders   EKG 12-Lead (Completed)      Will start her on baseline medications, but will likely refer to [facility] ([facility], run by our clinical pharmacist and Dr. Debara Pickett)  Current medicines are reviewed at length with the patient today. (+/- concerns) he she asked about Zetia The following changes have been made: We will start with Zetia and titrate up as noted  Patient Instructions  MEDICATION INSTRUCTION   ZETIA 10 MG ONE TABLET DAILY  START WITH EVERY OTHER DAY FOR 12 DAYS THEN EVERY DAY  INCREASE FISH OIL  TO 1000 MG THREE TIMES A DAY   ( YOU EITHER TAKE ALL TOGETHER OR THREE TIMES A DAY   LABS IN 3 MONTHS   LIPID , CMP  WILL MAIL YOU [place] IN 2 MONTHS    Your physician recommends that you schedule a follow-up appointment in [place].   If you need a refill on your cardiac medications before your next appointment, please call your pharmacy.     Studies Ordered:   Orders Placed This Encounter  Procedures  . Lipid panel  . Comprehensive metabolic panel  . Lipid panel  . Comprehensive metabolic panel  . EKG 12-Lead      Glenetta Hew, M.D., M.S. Interventional Cardiologist    Pager # [phone] Phone # [phone] [address] [place], [place] [address]   Thank you for choosing [facility] at [facility]!!

## 2018-01-30 ENCOUNTER — Other Ambulatory Visit: Payer: Self-pay

## 2018-01-30 MED ORDER — CANAGLIFLOZIN 300 MG PO TABS: ORAL_TABLET | ORAL | 5 refills | Status: DC

## 2018-01-30 MED ORDER — PIOGLITAZONE HCL 15 MG PO TABS: ORAL_TABLET | ORAL | 5 refills | Status: DC

## 2018-01-30 MED ORDER — SITAGLIPTIN PHOSPHATE 100 MG PO TABS: 100.0000 mg | ORAL_TABLET | Freq: Every day | ORAL | 1 refills | Status: DC

## 2018-01-31 ENCOUNTER — Encounter: Payer: Self-pay | Admitting: Cardiology

## 2018-01-31 NOTE — Assessment & Plan Note (Addendum)
BP stable on 100 mg Toprol and 5 mg of amlodipine plus hydralazine 25 mg 3 times a day. Currently managed by PCP. She has an intolerance listed to ARB is an ACE inhibitor - using hydralazine instead.

## 2018-01-31 NOTE — Assessment & Plan Note (Addendum)
Actually the process of OSA evaluation. Clearly weight loss with diet next size would be beneficial for combination of metabolic syndrome component's: Lipid control,, hypertension and diabetes.  Unfortunately, it seems that she is probably disabled and it would be difficult for her to exercise. We talked about the importance of dietary modification. In the future we would consider the possibility of doing water exercises/aerobics.

## 2018-01-31 NOTE — Assessment & Plan Note (Signed)
Actually her lipids aren't that bad with total cholesterol 214 and LDL 131.  She basally has the metabolic syndrome with morbid obesity, diabetes and hypertension - the HDL of 61 and regular is 108 do not correlate with minimal syndrome however.  In the setting of these risk factors however, target LDL should be less than 100 if not closer 70. She has tried many statins and has had intolerance because of exacerbation of her interstitial cystitis. For now we'll start with Zetia and reassess lipids in about 3 months.  I've also recommended that she increase her fish oil to a total of 3000 mg a day.  Depending on how she does with Zetia, we may consider Bempedoic Acid as an alternative.

## 2018-02-03 LAB — COMPREHENSIVE METABOLIC PANEL
A/G RATIO: 1.5 (ref 1.2–2.2)
ALK PHOS: 86 IU/L (ref 39–117)
ALT: 10 IU/L (ref 0–32)
AST: 16 IU/L (ref 0–40)
Albumin: 3.9 g/dL (ref 3.6–4.8)
BILIRUBIN TOTAL: 0.3 mg/dL (ref 0.0–1.2)
BUN/Creatinine Ratio: 15 (ref 12–28)
BUN: 12 mg/dL (ref 8–27)
CHLORIDE: 100 mmol/L (ref 96–106)
CO2: 23 mmol/L (ref 20–29)
Calcium: 9.2 mg/dL (ref 8.7–10.3)
Creatinine, Ser: 0.78 mg/dL (ref 0.57–1.00)
GFR calc non Af Amer: 83 mL/min/{1.73_m2} (ref >59)
GFR, EST AFRICAN AMERICAN: 96 mL/min/{1.73_m2} (ref >59)
GLUCOSE: 117 mg/dL — AB (ref 65–99)
Globulin, Total: 2.6 g/dL (ref 1.5–4.5)
POTASSIUM: 4.5 mmol/L (ref 3.5–5.2)
Sodium: 137 mmol/L (ref 134–144)
TOTAL PROTEIN: 6.5 g/dL (ref 6.0–8.5)

## 2018-02-03 LAB — LIPID PANEL
CHOL/HDL RATIO: 3.6 ratio (ref 0.0–4.4)
Cholesterol, Total: 187 mg/dL (ref 100–199)
HDL: 52 mg/dL (ref >39)
LDL CALC: 109 mg/dL — AB (ref 0–99)
TRIGLYCERIDES: 131 mg/dL (ref 0–149)
VLDL CHOLESTEROL CAL: 26 mg/dL (ref 5–40)

## 2018-02-12 ENCOUNTER — Other Ambulatory Visit: Payer: Self-pay | Admitting: Family

## 2018-02-27 ENCOUNTER — Institutional Professional Consult (permissible substitution): Payer: Medicare Other | Admitting: Pulmonary Disease

## 2018-03-03 ENCOUNTER — Encounter: Payer: Self-pay | Admitting: Internal Medicine

## 2018-03-03 ENCOUNTER — Ambulatory Visit (INDEPENDENT_AMBULATORY_CARE_PROVIDER_SITE_OTHER): Payer: Medicare Other | Admitting: Internal Medicine

## 2018-03-03 VITALS — BP 118/74 | HR 80 | Ht 65.0 in | Wt 265.0 lb

## 2018-03-03 DIAGNOSIS — E785 Hyperlipidemia, unspecified: Secondary | ICD-10-CM | POA: Diagnosis not present

## 2018-03-03 DIAGNOSIS — E042 Nontoxic multinodular goiter: Secondary | ICD-10-CM | POA: Diagnosis not present

## 2018-03-03 DIAGNOSIS — E1165 Type 2 diabetes mellitus with hyperglycemia: Secondary | ICD-10-CM | POA: Diagnosis not present

## 2018-03-03 DIAGNOSIS — E1169 Type 2 diabetes mellitus with other specified complication: Secondary | ICD-10-CM

## 2018-03-03 LAB — POCT GLYCOSYLATED HEMOGLOBIN (HGB A1C): HEMOGLOBIN A1C: 6.1

## 2018-03-03 NOTE — Patient Instructions (Addendum)
Please continue: - Invokana 300 mg  before b'fast. - Trulicity 1.5 mg weekly  Please restart Metformin 500 mg daily with dinner and increase to 1000 mg with dinner.  Stop Actos and Januvia.  Please schedule a new annual eye exam.  Please return in  3 months with your sugar log.

## 2018-03-03 NOTE — Progress Notes (Signed)
Patient ID: [name], female   DOB: [birth year], 62 y.o.   MRN: [id number]   HPI: [name] is a 62 y.o.-year-old female, returning for follow-up for DM2, dx in ~2012, non-insulin-dependent, uncontrolled, with complications (cerebro-vascular ds - h/o TIA 2013). Last visit 7.5 months ago.  Last hemoglobin A1c was: Lab Results  Component Value Date   HGBA1C 7.8 (H) 07/15/2017   HGBA1C 7.4 (H) 11/23/2016   HGBA1C 7.3 (H) 08/14/2016   Pt Was on a regimen of: - Metformin ER 2000 mg with breakfast - Januvia 100 mg at bedtime (!) - Invokana 300 mg in am before or with b'fast  She is now on: >> did not start as advised at last visit - Invokana 300 mg  before b'fast. - Actos 15 mg before b'fast - Trulicity 1.5 mg weekly >> started 06/2017 -no nausea, vomiting - Januvia 100 mg in a.m.>> did not stop as advised at last visit  She checks her sugars 1-4 a day: - am: n/c >> 96-121, 164 - 2h after b'fast: n/c >> 143, 160 - before lunch: n/c >> 148 - 2h after lunch: n/c >> 219, 261 >> 122, 137  - before dinner: n/c >> 174 >> 111, 129-154, 163 - 2h after dinner: n/c >> 173-215, 236 >> 114-155 - bedtime: n/c >> 116-158 - nighttime: n/c >> 122-155 (sic) Lowest sugar was 120 >> 159 >> 96;   It is unclear at which level she has hypoglycemia awareness. Highest sugar was 180s >> 236 >> 163.  Glucometer: One Touch ultra  Pt's meals are: - Breakfast 7-10 am: raisin toast x 2 slices; iced coffee - Lunch: skips lunch - Dinner: meat + veggies; BBQ tray; hamburger + fries - usually eats out.  May have iced tea or Dr Malachi Bonds - regular (!); or cereals. - Snacks: icecream  -No CKD, last BUN/creatinine:  Lab Results  Component Value Date   BUN 12 02/03/2018   BUN 24 10/25/2017   CREATININE 0.78 02/03/2018   CREATININE 0.93 10/25/2017  On losartan 100. -+ HL; last set of lipids: Lab Results  Component Value Date   CHOL 187 02/03/2018   HDL 52 02/03/2018   LDLCALC 109 (H) 02/03/2018    LDLDIRECT 136.5 10/11/2009   TRIG 131 02/03/2018   CHOLHDL 3.6 02/03/2018  On omega-3 fatty acids,   Zetia. - last eye exam was in  2017: No DR -No numbness and tingling in her feet.  She has OSA >> does not wear her C-pap as her mask is not fitting well >> Causes anxiety  Multinodular goiter:  -Benign biopsy in 2011  We checked a thyroid ultrasound after last visit (09/09/2017): This showed enlargement of the previously biopsied complex cystic nodule in the left thyroid, but probably the result of several smaller complex cystic nodules coalescence.  The rest of the nodules were unchanged.  She has a history of interstitial cystitis - had bladder hydrodistension with Botox, IBS with diarrhea, restless leg syndrome, anxiety/depression, fibromyalgia, pelvic floor dysfunction.  ROS: Constitutional: no weight gain/no weight loss, no fatigue, no subjective hyperthermia, no subjective hypothermia Eyes: no blurry vision, no xerophthalmia ENT: no sore throat, no nodules palpated in throat, no dysphagia, no odynophagia, no hoarseness Cardiovascular: no CP/no SOB/no palpitations/no leg swelling Respiratory: no cough/no SOB/no wheezing Gastrointestinal: no N/no V/no D/no C/no acid reflux Musculoskeletal: no muscle aches/no joint aches Skin: no rashes, no hair loss Neurological: no tremors/no numbness/no tingling/no dizziness  I reviewed pt's medications, allergies, PMH, social  hx, family hx, and changes were documented in the history of present illness. Otherwise, unchanged from my initial visit note.   Past Medical History:  Diagnosis Date  . Anxiety   . Arthritis    knees  . Chronic fatigue syndrome   . Chronic pain syndrome    pain clinic w/ [place]  . Dyslipidemia   . Fibromyalgia   . GERD (gastroesophageal reflux disease)   . History of transient ischemic attack (TIA) 08/2012   per pt no residual  . Hypertension   . IBS (irritable bowel syndrome)   . IC (interstitial  cystitis) urologist--- dr Alona Bene [facility]  . Major depression   . Migraine   . Mild obstructive sleep apnea    per study 2005  mild osa, last used cpap 2016,  per pt needs another sleep study   . Mixed incontinence urge and stress   . Multiple thyroid nodules    bilateral --- hx benign bx 2010  . OSA (obstructive sleep apnea)    Has pending sleep study consult for April  . Osteopenia   . PONV (postoperative nausea and vomiting)   . Restless leg syndrome   . Soft tissue mass    right forearm and right thigh  . Type 2 diabetes mellitus ([place])    last A1c 7.8 on 07-15-2017 in epic  . Wears glasses    Past Surgical History:  Procedure Laterality Date  . ABDOMINAL EXPLORATION SURGERY  1983   MECKEL diverticulum removed and APPENDECTOMY and hemicolectomy  . ANTERIOR CERVICAL DECOMP/DISCECTOMY FUSION  01-04-2009   dr pool  [facility]  . BREAST CYST EXCISION Left 1978   benign  . CARDIOVASCULAR STRESS TEST  03/17/2013   normal nuclear study w/ no ischemia/  normal LV function and wall motion , ef 62%  . COLONOSCOPY  last one 08-14-2011  . DIAGNOSTIC LAPAROSCOPY  01-18-2004  dr Radene Knee; 1996; 1995  . ESOPHAGOGASTRODUODENOSCOPY  last one 09-03-2013   dr Candie Echevaria  . EXCISION LIPOMA LEFT THIGH  04-03-2004  dr Margot Chimes  . EXPLORATORY LAPAROTOMY W/ RIGHT OVARIAN CYSTECTOMY AND LYSIS ADHESIONS  1982  . LAPAROSCOPIC ASSISTED VAGINAL HYSTERECTOMY  1992  . LIPOMA EXCISION  05-16-2001;  1998;  [place]  . MASS EXCISION Right 11/06/2017   Procedure: EXCISE MASSES OF RIGHT FOREARM AND RIGHT THIGH;  Surgeon: Armandina Gemma, MD;  Location: [place];  Service: General;  Laterality: Right;  . MULTIPLE CYSTO/  HYDRODISTENTION/ INSTILLATION THERAPY  since 2001;  last one 08/ 2018   dr Amalia Hailey  [facility]  . NASAL SINUS SURGERY  2004  . RECTOCELE REPAIR  07/2008  . TONSILLECTOMY AND ADENOIDECTOMY  1970  . TOTAL KNEE ARTHROPLASTY Bilateral left 07-31-2010/  right 08-29-2010   dr Alvan Dame  [facility]  .  TRANSTHORACIC ECHOCARDIOGRAM  08/23/2012   normal  w/ ef 60-65%  . VENTRAL HERNIA REPAIR  06-05-2006 dr Excell Seltzer [facility];  2001; 1992   Social History   Social History  . Marital status: Divorced    Spouse name: N/A  . Number of children: 2   Occupational History  . disabled    Social History Main Topics  . Smoking status: Former Smoker    Packs/day: 0.10    Years: 1.00    Types: Cigarettes    Quit date: 10/19/2012  . Smokeless tobacco: Never Used     Comment: using electronic cigarettes  . Alcohol use Yes     Comment: 2 drinks per month - liquor  .  Drug use: No   Social History Narrative   Disabled   Divorced, lives alone - 1 dtr, 1 son   Current Outpatient Medications on File Prior to Visit  Medication Sig Dispense Refill  . ALPRAZolam (XANAX) 1 MG tablet Take 1 tablet (1 mg total) by mouth 3 (three) times daily as needed for anxiety. (Patient taking differently: Take 1 mg by mouth 3 (three) times daily as needed for anxiety. ) 30 tablet 0  . amLODipine (NORVASC) 5 MG tablet Take 1 tablet (5 mg total) by mouth daily. (Patient taking differently: Take 5 mg by mouth every evening. ) 90 tablet 3  . amoxicillin-clavulanate (AUGMENTIN) 875-125 MG tablet Take 1 tablet by mouth 2 (two) times daily. 20 tablet 0  . aspirin EC 325 MG tablet Take 325 mg by mouth daily.    . benzonatate (TESSALON) 100 MG capsule Take 1 capsule (100 mg total) by mouth 3 (three) times daily as needed. (Patient taking differently: Take 100 mg by mouth 3 (three) times daily as needed. ) 20 capsule 0  . benzonatate (TESSALON) 100 MG capsule Take 1 capsule (100 mg total) by mouth 3 (three) times daily as needed. 20 capsule 0  . bismuth subsalicylate (PEPTO-BISMOL) 262 MG/15ML suspension Take 30 mLs by mouth every 6 (six) hours as needed. 360 mL 0  . canagliflozin (INVOKANA) 300 MG TABS tablet TAKE 1 TABLET(300 MG) BY MOUTH DAILY BEFORE BREAKFAST 30 tablet 5  . desvenlafaxine (PRISTIQ) 100 MG 24 hr tablet TK 1 T  PO D--- takes in am  0  . Diclofenac Sodium (PENNSAID) 2 % SOLN Place 1 application onto the skin at bedtime as needed (knee pain).    Marland Kitchen dicyclomine (BENTYL) 20 MG tablet TAKE 1 TABLET(20 MG) BY MOUTH EVERY 6 HOURS 30 tablet 0  . Dulaglutide (TRULICITY) 1.5 YH/0.6CB SOPN Inject into skin weekly 4 pen 1  . esomeprazole (NEXIUM) 40 MG capsule TAKE 1 CAPSULE BY MOUTH EVERY DAY AT NOON 90 capsule 1  . ezetimibe (ZETIA) 10 MG tablet Take 1 tablet (10 mg total) by mouth daily. 90 tablet 3  . fentaNYL (DURAGESIC - DOSED MCG/HR) 75 MCG/HR Place 1 patch onto the skin every other day.  0  . furosemide (LASIX) 20 MG tablet TAKE 1 TABLET BY MOUTH DAILY (Patient taking differently: TAKE 1 TABLET BY MOUTH DAILY---  takes prn for fluid retention) 30 tablet 3  . glucose blood (ONE TOUCH ULTRA TEST) test strip USE TO TEST AS DIRECTED 100 each 1  . hydrALAZINE (APRESOLINE) 25 MG tablet TAKE 1 TABLET(25 MG) BY MOUTH THREE TIMES DAILY  (for IC) 90 tablet 2  . lidocaine (LIDODERM) 5 % Place 2 patches onto the skin at bedtime as needed (hip pain).     Marland Kitchen losartan (COZAAR) 100 MG tablet Take 1 tablet (100 mg total) by mouth daily. 90 tablet 1  . metoprolol succinate (TOPROL-XL) 100 MG 24 hr tablet TAKE 1 TABLET BY MOUTH DAILY, TAKE WITH OR IMMEDIATELY FOLLOWING A MEAL 90 tablet 1  . mirabegron ER (MYRBETRIQ) 50 MG TB24 Take 50 mg by mouth at bedtime.     Marland Kitchen nystatin cream (MYCOSTATIN) Apply 1 application topically 2 (two) times daily. 30 g 2  . Omega-3 Fatty Acids (FISH OIL) 1000 MG CAPS Take 3 capsules (3,000 mg total) by mouth daily. 300 capsule 11  . ONETOUCH DELICA LANCETS 76E MISC TEST BLOOD SUGAR 1 TO 2 TIMES DAILY 100 each 2  . Oxycodone HCl 10 MG TABS Take  10 mg by mouth every 8 (eight) hours as needed.   0  . pioglitazone (ACTOS) 15 MG tablet TAKE 1 TABLET(15 MG) BY MOUTH DAILY 30 tablet 5  . potassium gluconate 595 MG TABS tablet Take 595 mg by mouth daily as needed (leg cramps).    . promethazine (PHENERGAN)  25 MG tablet Take 25 mg by mouth daily as needed for nausea or vomiting.     Marland Kitchen rOPINIRole (REQUIP) 4 MG tablet TAKE 1 TABLET(4 MG) BY MOUTH AT BEDTIME 90 tablet 1  . Semaglutide (OZEMPIC) 0.25 or 0.5 MG/DOSE SOPN Inject 0.5 mg into the skin once a week. 2 pen 5  . sitaGLIPtin (JANUVIA) 100 MG tablet Take 1 tablet (100 mg total) by mouth daily with breakfast. 90 tablet 1  . tiZANidine (ZANAFLEX) 4 MG tablet Take 1 tablet (4 mg total) by mouth at bedtime. 30 tablet 0  . topiramate (TOPAMAX) 50 MG tablet Take 1 tablet (50 mg total) by mouth 2 (two) times daily as needed (headaches). 60 tablet 3  . traZODone (DESYREL) 100 MG tablet Take 100-300 mg by mouth at bedtime as needed. Take 2-3 tablets at bedtime as needed.    . valACYclovir (VALTREX) 1000 MG tablet Take 1 tablet (1,000 mg total) by mouth 3 (three) times daily. (Patient taking differently: Take 1,000 mg by mouth 3 (three) times daily. ) 30 tablet 0  . [DISCONTINUED] pravastatin (PRAVACHOL) 20 MG tablet Take 1 tablet (20 mg total) by mouth daily. 30 tablet 3   No current facility-administered medications on file prior to visit.    Allergies  Allergen Reactions  . Nsaids Nausea And Vomiting and Swelling    Worsening symptoms- interstitial cystitist  . Atorvastatin     Body aches, worsened interstitial cystitis. Also simvastatin  . Buprenorphine Hcl Nausea And Vomiting  . Chlorzoxazone Other (See Comments) and Nausea Only    Unknown reaction  . Darvon [Propoxyphene] Other (See Comments)    hallucinations  . Gabapentin Nausea And Vomiting  . Gemfibrozil Nausea And Vomiting  . Metronidazole Nausea And Vomiting  . Morphine And Related Nausea And Vomiting  . Nitrofurantoin Nausea And Vomiting  . Olmesartan Medoxomil-Hctz Nausea And Vomiting  . Ondansetron Nausea And Vomiting  . Pentosan Polysulfate Sodium Other (See Comments)    Unknown reaction  . Pravastatin Other (See Comments)    Body aches, worsening interstitial cystitis.  .  Ramipril Cough  . Sulfa Antibiotics Nausea And Vomiting  . Elmiron [Pentosan Polysulfate] Nausea And Vomiting and Rash   Family History  Problem Relation Age of Onset  . Breast cancer Mother   . Dementia Mother   . Diabetes Father   . Dementia Father   . Alzheimer's disease Father   . Breast cancer Maternal Grandmother   . Throat cancer Maternal Grandmother   . Esophageal cancer Maternal Grandmother   . Breast cancer Other        maternal great aunts x 5  . Rheum arthritis Other   . Diabetes Maternal Grandfather   . Colon cancer Neg Hx    PE: BP 118/74   Pulse 80   Ht 5\' 5"  (1.651 m)   Wt 265 lb (120.2 kg)   SpO2 96%   BMI 44.10 kg/m  Wt Readings from Last 3 Encounters:  03/03/18 265 lb (120.2 kg)  01/28/18 258 lb (117 kg)  01/22/18 245 lb 6.4 oz (111.3 kg)   Constitutional: overweight, in NAD Eyes: PERRLA, EOMI, no exophthalmos ENT: moist mucous membranes, +  lumpy bumpy thyroid, no cervical lymphadenopathy Cardiovascular: RRR, No MRG Respiratory: CTA B Gastrointestinal: abdomen soft, NT, ND, BS+ Musculoskeletal: no deformities, strength intact in all 4 Skin: moist, warm, no rashes Neurological: no tremor with outstretched hands, DTR normal in all 4  ASSESSMENT: 1. DM2, non-insulin-dependent, uncontrolled, with complications - Cerebrovascular disease- h/o TIA in 2013  2. Thyroid nodules  3. HL  PLAN:  1. Patient with long-standing, uncontrolled, type 2 diabetes, with worse control at last visit she had to stop metformin due to diarrhea.  Due to this, she was started on Actos 50 mg daily by PCP.  She still had diarrhea after stopping metformin.  Therefore, at last visit, I advised her to restart metformin (she did not remember this)  I also suggested to start Trulicity and stop Januvia (she actually continued Januvia). - At this visit, sugars are much better and the GLP-1 receptor agonist.  We can go ahead and stop Januvia right now so we will try to stop Actos and  restart lower dose metformin ER.  We will continue with Invokana and - I suggested to:  Patient Instructions  Please continue: - Invokana 300 mg  before b'fast. - Trulicity 1.5 mg weekly  Please restart Metformin ER 500 mg daily with dinner and increase to 1000 mg with dinner.  Stop Actos and Januvia.  Please schedule a new annual eye exam.  Please return in  3 months with your sugar log.     - today, HbA1c is 6.1% (MUCH better) - continue checking sugars at different times of the day - check 1x a day, rotating checks - advised for yearly eye exams >> she is not UTD - Return to clinic in 3 mo with sugar log   2. Thyroid nodules -Reviewed her previous thyroid ultrasound from 2011 and this showed small nodules.  1 of them was biopsied and the biopsy was benign.  At last visit, we checked another thyroid ultrasound (09/09/2017): This showed enlargement of the previously biopsied complex cystic nodule in the left thyroid, but probably the result of several smaller complex cystic nodules coalescence.  The rest of the nodules were unchanged. - no neck compression symptoms -No intervention needed for now, we will continue to follow this expectantly.  3.  Hyperlipidemia - Reviewed latest lipid panel: LDL better, still slightly above goal: Lab Results  Component Value Date   CHOL 187 02/03/2018   HDL 52 02/03/2018   LDLCALC 109 (H) 02/03/2018   LDLDIRECT 136.5 10/11/2009   TRIG 131 02/03/2018   CHOLHDL 3.6 02/03/2018  - Continues the statin without side effects.  Philemon Kingdom, MD PhD [facility] Endocrinology

## 2018-03-04 ENCOUNTER — Telehealth: Payer: Self-pay | Admitting: Internal Medicine

## 2018-03-04 ENCOUNTER — Other Ambulatory Visit: Payer: Self-pay | Admitting: Internal Medicine

## 2018-03-04 MED ORDER — METFORMIN HCL ER 500 MG PO TB24: 1000.0000 mg | ORAL_TABLET | Freq: Every day | ORAL | 3 refills | Status: DC

## 2018-03-04 NOTE — Telephone Encounter (Signed)
Sorry for that! I sent it now.

## 2018-03-04 NOTE — Telephone Encounter (Signed)
Patient stated the pharmacy have not received her medication metformin.  [id number] - [place], [address] AT [address] #:  [id number]

## 2018-03-05 NOTE — Telephone Encounter (Signed)
Ok thanks. Called pt to inform rx sent. No answer.

## 2018-03-11 ENCOUNTER — Telehealth: Payer: Self-pay | Admitting: Family

## 2018-03-11 NOTE — Telephone Encounter (Signed)
Copied from [place] [id number]. Topic: Quick Communication - See Telephone Encounter >> Mar 11, 2018  5:37 PM Neva Seat wrote: Pt is having dental work done.  Pt is needing antibiotics.

## 2018-03-12 MED ORDER — AMOXICILLIN 500 MG PO CAPS: 2000.0000 mg | ORAL_CAPSULE | Freq: Once | ORAL | 0 refills | Status: AC

## 2018-03-12 NOTE — Telephone Encounter (Signed)
rx has been sent to her pharmacy.  

## 2018-03-12 NOTE — Telephone Encounter (Signed)
Advised patient rx was sent

## 2018-03-25 ENCOUNTER — Other Ambulatory Visit: Payer: Self-pay | Admitting: Family

## 2018-04-18 ENCOUNTER — Telehealth: Payer: Self-pay | Admitting: *Deleted

## 2018-04-18 ENCOUNTER — Other Ambulatory Visit: Payer: Self-pay | Admitting: *Deleted

## 2018-04-18 DIAGNOSIS — E785 Hyperlipidemia, unspecified: Principal | ICD-10-CM

## 2018-04-18 DIAGNOSIS — Z79899 Other long term (current) drug therapy: Secondary | ICD-10-CM

## 2018-04-18 DIAGNOSIS — E1169 Type 2 diabetes mellitus with other specified complication: Secondary | ICD-10-CM

## 2018-04-18 NOTE — Telephone Encounter (Signed)
Mailed labslip  And letter. 

## 2018-04-18 NOTE — Telephone Encounter (Signed)
-----   Message from Raiford Simmonds, RN sent at 02/12/2018  3:46 PM EDT ----- Need to order lipid , hepatic  05/15/18  ( 3 month see lab results Mail @ 5 /27/19

## 2018-04-23 ENCOUNTER — Other Ambulatory Visit: Payer: Self-pay | Admitting: Internal Medicine

## 2018-04-25 ENCOUNTER — Ambulatory Visit (INDEPENDENT_AMBULATORY_CARE_PROVIDER_SITE_OTHER): Payer: Medicare Other | Admitting: Family

## 2018-04-25 ENCOUNTER — Encounter: Payer: Self-pay | Admitting: Family

## 2018-04-25 ENCOUNTER — Other Ambulatory Visit: Payer: Self-pay | Admitting: Family

## 2018-04-25 VITALS — BP 147/63 | HR 74 | Temp 98.7°F | Ht 65.0 in | Wt 267.6 lb

## 2018-04-25 DIAGNOSIS — E119 Type 2 diabetes mellitus without complications: Secondary | ICD-10-CM

## 2018-04-25 DIAGNOSIS — I10 Essential (primary) hypertension: Secondary | ICD-10-CM

## 2018-04-25 DIAGNOSIS — L03119 Cellulitis of unspecified part of limb: Secondary | ICD-10-CM

## 2018-04-25 DIAGNOSIS — E785 Hyperlipidemia, unspecified: Secondary | ICD-10-CM

## 2018-04-25 LAB — BASIC METABOLIC PANEL
BUN: 17 mg/dL (ref 6–23)
CHLORIDE: 99 meq/L (ref 96–112)
CO2: 30 meq/L (ref 19–32)
CREATININE: 0.83 mg/dL (ref 0.40–1.20)
Calcium: 9.1 mg/dL (ref 8.4–10.5)
GFR: 74.33 mL/min (ref >60.00)
Glucose, Bld: 143 mg/dL — ABNORMAL HIGH (ref 70–99)
POTASSIUM: 4.8 meq/L (ref 3.5–5.1)
Sodium: 135 mEq/L (ref 135–145)

## 2018-04-25 MED ORDER — CEPHALEXIN 500 MG PO CAPS: 500.0000 mg | ORAL_CAPSULE | Freq: Three times a day (TID) | ORAL | 0 refills | Status: DC

## 2018-04-25 MED FILL — CEPHALEXIN 500 MG CAPSULE: 500 | 7 days supply | Qty: 21 | Fill #0

## 2018-04-25 NOTE — Progress Notes (Signed)
Subjective:    Patient ID: [name], female    DOB: 1956/04/03, 62 y.o.   MRN: [id number]  HPI   [name] is a 62 yr old female who presents today for follow up.   HTN- Continues amlodipine, losartan and metoprolol, hydralazine.  BP Readings from Last 3 Encounters:  04/25/18 (!) 147/63  03/03/18 118/74  01/28/18 131/74   Hyperlipidemia- maintained on zetia, Intolerant to statins.  Lab Results  Component Value Date   CHOL 187 02/03/2018   HDL 52 02/03/2018   LDLCALC 109 (H) 02/03/2018   LDLDIRECT 136.5 10/11/2009   TRIG 131 02/03/2018   CHOLHDL 3.6 02/03/2018   DM2- She is being followed by endocrinology.  Lab Results  Component Value Date   HGBA1C 6.1 03/03/2018   HGBA1C 7.8 (H) 07/15/2017   HGBA1C 7.4 (H) 11/23/2016   Lab Results  Component Value Date   MICROALBUR 0.2 11/23/2016   LDLCALC 109 (H) 02/03/2018   CREATININE 0.78 02/03/2018   Skin rash- reports that she noticed red rash on her legs this AM. Denies pain or sun exposure.   Review of Systems See HPI  Past Medical History:  Diagnosis Date  . Anxiety   . Arthritis    knees  . Chronic fatigue syndrome   . Chronic pain syndrome    pain clinic w/ [facility]  . Dyslipidemia   . Fibromyalgia   . GERD (gastroesophageal reflux disease)   . History of transient ischemic attack (TIA) 08/2012   per pt no residual  . Hypertension   . IBS (irritable bowel syndrome)   . IC (interstitial cystitis) urologist--- dr Alona Bene [facility]  . Major depression   . Migraine   . Mild obstructive sleep apnea    per study 2005  mild osa, last used cpap 2016,  per pt needs another sleep study   . Mixed incontinence urge and stress   . Multiple thyroid nodules    bilateral --- hx benign bx 2010  . OSA (obstructive sleep apnea)    Has pending sleep study consult for April  . Osteopenia   . PONV (postoperative nausea and vomiting)   . Restless leg syndrome   . Soft tissue mass    right forearm and right thigh   . Type 2 diabetes mellitus ([place])    last A1c 7.8 on 07-15-2017 in epic  . Wears glasses      Social History   Socioeconomic History  . Marital status: Divorced    Spouse name: Not on file  . Number of children: 2  . Years of education: Not on file  . Highest education level: Not on file  Occupational History  . Occupation: disabled  Social Needs  . Financial resource strain: Not on file  . Food insecurity:    Worry: Not on file    Inability: Not on file  . Transportation needs:    Medical: Not on file    Non-medical: Not on file  Tobacco Use  . Smoking status: Former Smoker    Packs/day: 0.10    Years: 1.00    Pack years: 0.10    Types: Cigarettes    Last attempt to quit: 10/19/2012    Years since quitting: 5.5  . Smokeless tobacco: Never Used  Substance and Sexual Activity  . Alcohol use: No    Frequency: Never    Comment: rare - 4-5 drinks a year  . Drug use: No  . Sexual activity: Never  Lifestyle  .  Physical activity:    Days per week: Not on file    Minutes per session: Not on file  . Stress: Not on file  Relationships  . Social connections:    Talks on phone: Not on file    Gets together: Not on file    Attends religious service: Not on file    Active member of club or organization: Not on file    Attends meetings of clubs or organizations: Not on file    Relationship status: Not on file  . Intimate partner violence:    Fear of current or ex partner: Not on file    Emotionally abused: Not on file    Physically abused: Not on file    Forced sexual activity: Not on file  Other Topics Concern  . Not on file  Social History Narrative   Disabled   Divorced, lives alone with a cat. -    Children: 1 dtr, 1 son, 4 grandchildren      She takes fentanyl and oxycodone for pain. Does not exercise because of being disabled.    Past Surgical History:  Procedure Laterality Date  . ABDOMINAL EXPLORATION SURGERY  1983   MECKEL diverticulum removed and  APPENDECTOMY and hemicolectomy  . ANTERIOR CERVICAL DECOMP/DISCECTOMY FUSION  01-04-2009   dr pool  [facility]  . BREAST CYST EXCISION Left 1978   benign  . CARDIOVASCULAR STRESS TEST  03/17/2013   normal nuclear study w/ no ischemia/  normal LV function and wall motion , ef 62%  . COLONOSCOPY  last one 08-14-2011  . DIAGNOSTIC LAPAROSCOPY  01-18-2004  dr Radene Knee; 1996; 1995  . ESOPHAGOGASTRODUODENOSCOPY  last one 09-03-2013   dr Candie Echevaria  . EXCISION LIPOMA LEFT THIGH  04-03-2004  dr Margot Chimes  . EXPLORATORY LAPAROTOMY W/ RIGHT OVARIAN CYSTECTOMY AND LYSIS ADHESIONS  1982  . LAPAROSCOPIC ASSISTED VAGINAL HYSTERECTOMY  1992  . LIPOMA EXCISION  05-16-2001;  1998;  [place]  . MASS EXCISION Right 11/06/2017   Procedure: EXCISE MASSES OF RIGHT FOREARM AND RIGHT THIGH;  Surgeon: Armandina Gemma, MD;  Location: [place];  Service: General;  Laterality: Right;  . MULTIPLE CYSTO/  HYDRODISTENTION/ INSTILLATION THERAPY  since 2001;  last one 08/ 2018   dr Amalia Hailey  [facility]  . NASAL SINUS SURGERY  2004  . RECTOCELE REPAIR  07/2008  . TONSILLECTOMY AND ADENOIDECTOMY  1970  . TOTAL KNEE ARTHROPLASTY Bilateral left 07-31-2010/  right 08-29-2010   dr Alvan Dame  [facility]  . TRANSTHORACIC ECHOCARDIOGRAM  08/23/2012   normal  w/ ef 60-65%  . VENTRAL HERNIA REPAIR  06-05-2006 dr Excell Seltzer [facility];  2001; 1992    Family History  Problem Relation Age of Onset  . Breast cancer Mother   . Dementia Mother   . Diabetes Father   . Dementia Father   . Alzheimer's disease Father   . Breast cancer Maternal Grandmother   . Throat cancer Maternal Grandmother   . Esophageal cancer Maternal Grandmother   . Breast cancer Other        maternal great aunts x 5  . Rheum arthritis Other   . Diabetes Maternal Grandfather   . Colon cancer Neg Hx     Allergies  Allergen Reactions  . Nsaids Nausea And Vomiting and Swelling    Worsening symptoms- interstitial cystitist  . Atorvastatin     Body aches, worsened interstitial  cystitis. Also simvastatin  . Buprenorphine Hcl Nausea And Vomiting  . Chlorzoxazone Other (See Comments)  and Nausea Only    Unknown reaction  . Darvon [Propoxyphene] Other (See Comments)    hallucinations  . Gabapentin Nausea And Vomiting  . Gemfibrozil Nausea And Vomiting  . Metronidazole Nausea And Vomiting  . Morphine And Related Nausea And Vomiting  . Nitrofurantoin Nausea And Vomiting  . Olmesartan Medoxomil-Hctz Nausea And Vomiting  . Ondansetron Nausea And Vomiting  . Pentosan Polysulfate Sodium Other (See Comments)    Unknown reaction  . Pravastatin Other (See Comments)    Body aches, worsening interstitial cystitis.  . Ramipril Cough  . Sulfa Antibiotics Nausea And Vomiting  . Elmiron [Pentosan Polysulfate] Nausea And Vomiting and Rash    Current Outpatient Medications on File Prior to Visit  Medication Sig Dispense Refill  . ALPRAZolam (XANAX) 1 MG tablet Take 1 tablet (1 mg total) by mouth 3 (three) times daily as needed for anxiety. (Patient taking differently: Take 1 mg by mouth 3 (three) times daily as needed for anxiety. ) 30 tablet 0  . amLODipine (NORVASC) 5 MG tablet Take 1 tablet (5 mg total) by mouth daily. (Patient taking differently: Take 5 mg by mouth every evening. ) 90 tablet 3  . aspirin EC 81 MG tablet Take 81 mg by mouth daily.    Marland Kitchen bismuth subsalicylate (PEPTO-BISMOL) 262 MG/15ML suspension Take 30 mLs by mouth every 6 (six) hours as needed. 360 mL 0  . canagliflozin (INVOKANA) 300 MG TABS tablet TAKE 1 TABLET(300 MG) BY MOUTH DAILY BEFORE BREAKFAST 30 tablet 5  . desvenlafaxine (PRISTIQ) 100 MG 24 hr tablet TK 1 T PO D--- takes in am  0  . Diclofenac Sodium (PENNSAID) 2 % SOLN Place 1 application onto the skin at bedtime as needed (knee pain).    Marland Kitchen dicyclomine (BENTYL) 20 MG tablet TAKE 1 TABLET(20 MG) BY MOUTH EVERY 6 HOURS 30 tablet 0  . esomeprazole (NEXIUM) 40 MG capsule TAKE 1 CAPSULE BY MOUTH EVERY DAY AT NOON 90 capsule 1  . ezetimibe (ZETIA) 10  MG tablet Take 1 tablet (10 mg total) by mouth daily. 90 tablet 3  . fentaNYL (DURAGESIC - DOSED MCG/HR) 75 MCG/HR Place 1 patch onto the skin every other day.  0  . furosemide (LASIX) 20 MG tablet TAKE 1 TABLET BY MOUTH DAILY (Patient taking differently: TAKE 1 TABLET BY MOUTH DAILY---  takes prn for fluid retention) 30 tablet 3  . glucose blood (ONE TOUCH ULTRA TEST) test strip USE TO TEST AS DIRECTED 100 each 1  . hydrALAZINE (APRESOLINE) 25 MG tablet TAKE 1 TABLET(25 MG) BY MOUTH THREE TIMES DAILY  (for IC) 90 tablet 2  . lidocaine (LIDODERM) 5 % Place 2 patches onto the skin at bedtime as needed (hip pain).     Marland Kitchen losartan (COZAAR) 100 MG tablet Take 1 tablet (100 mg total) by mouth daily. 90 tablet 1  . metFORMIN (GLUCOPHAGE-XR) 500 MG 24 hr tablet Take 2 tablets (1,000 mg total) by mouth daily with supper. 180 tablet 3  . metoprolol succinate (TOPROL-XL) 100 MG 24 hr tablet TAKE 1 TABLET BY MOUTH DAILY, TAKE WITH OR IMMEDIATELY FOLLOWING A MEAL 90 tablet 1  . mirabegron ER (MYRBETRIQ) 50 MG TB24 Take 50 mg by mouth at bedtime.     . mirtazapine (REMERON) 15 MG tablet Take 1 tablet by mouth daily.    Marland Kitchen nystatin cream (MYCOSTATIN) Apply 1 application topically 2 (two) times daily. 30 g 2  . Omega-3 Fatty Acids (FISH OIL) 1000 MG CAPS Take 3 capsules (3,000  mg total) by mouth daily. 300 capsule 11  . ONETOUCH DELICA LANCETS 41D MISC TEST BLOOD SUGAR 1 TO 2 TIMES DAILY 100 each 2  . Oxycodone HCl 10 MG TABS Take 10 mg by mouth every 8 (eight) hours as needed.   0  . pioglitazone (ACTOS) 15 MG tablet TAKE 1 TABLET(15 MG) BY MOUTH DAILY 30 tablet 5  . potassium gluconate 595 MG TABS tablet Take 595 mg by mouth daily as needed (leg cramps).    . prazosin (MINIPRESS) 2 MG capsule Take 1 capsule by mouth daily.    . promethazine (PHENERGAN) 25 MG tablet Take 25 mg by mouth daily as needed for nausea or vomiting.     Marland Kitchen rOPINIRole (REQUIP) 4 MG tablet TAKE 1 TABLET(4 MG) BY MOUTH AT BEDTIME 90 tablet 1   . sitaGLIPtin (JANUVIA) 100 MG tablet Take 1 tablet (100 mg total) by mouth daily with breakfast. 90 tablet 1  . tiZANidine (ZANAFLEX) 4 MG tablet Take 1 tablet (4 mg total) by mouth at bedtime. 30 tablet 0  . topiramate (TOPAMAX) 50 MG tablet TAKE 1 TABLET BY MOUTH TWICE A DAY AS NEEDED 180 tablet 1  . traZODone (DESYREL) 100 MG tablet Take 100-300 mg by mouth at bedtime as needed. Take 2-3 tablets at bedtime as needed.    . TRULICITY 1.5 QQ/2.2LN SOPN INJECT INTO SKIN WEEKLY 2 pen 1  . valACYclovir (VALTREX) 1000 MG tablet Take 1 tablet (1,000 mg total) by mouth 3 (three) times daily. (Patient taking differently: Take 1,000 mg by mouth 3 (three) times daily. ) 30 tablet 0  . [DISCONTINUED] pravastatin (PRAVACHOL) 20 MG tablet Take 1 tablet (20 mg total) by mouth daily. 30 tablet 3   No current facility-administered medications on file prior to visit.     BP (!) 147/63 (BP Location: Right Arm, Cuff Size: Large)   Pulse 74   Temp 98.7 F (37.1 C) (Oral)   Ht 5\' 5"  (1.651 m)   Wt 267 lb 9.6 oz (121.4 kg)   SpO2 98%   BMI 44.53 kg/m       Objective:   Physical Exam  Constitutional: She appears well-developed and well-nourished.  Cardiovascular: Normal rate, regular rhythm and normal heart sounds.  No murmur heard. Pulmonary/Chest: Effort normal and breath sounds normal. No respiratory distress. She has no wheezes.  Musculoskeletal:  2+ bilateral LE edema  Skin:  Bilateral erythematous rash of LE R>L  Psychiatric: She has a normal mood and affect. Her behavior is normal. Judgment and thought content normal.           Assessment & Plan:  Cellulitis- new- rx with keflex. Pt is advised as follows:    Start keflex for the skin infection in your legs. Call if increased pain/swelling/redness or if you develop fever.   DM2- much improved. Commended pt on her work. Management per endo.  HTN- fair bp today. Continue current meds. Repeat in 1 week.   Hyperlipidemia- will have  lipid panel drawn in 2 weeks for cardiology. Continue zetia.

## 2018-04-25 NOTE — Patient Instructions (Signed)
Please complete lab work prior to leaving. Start keflex for the skin infection in your legs. Call if increased pain/swelling/redness or if you develop fever.  Keep up the good work with your diabetes.

## 2018-04-25 NOTE — Addendum Note (Signed)
Addended by: Kelle Darting A on: 04/25/2018 02:07 PM   Modules accepted: Orders

## 2018-05-05 ENCOUNTER — Ambulatory Visit: Payer: Medicare Other | Admitting: Family

## 2018-05-08 ENCOUNTER — Institutional Professional Consult (permissible substitution): Payer: Medicare Other | Admitting: Pulmonary Disease

## 2018-05-10 ENCOUNTER — Other Ambulatory Visit: Payer: Self-pay | Admitting: Family

## 2018-05-14 ENCOUNTER — Ambulatory Visit: Payer: Medicare Other | Admitting: Cardiology

## 2018-05-27 ENCOUNTER — Telehealth: Payer: Self-pay | Admitting: *Deleted

## 2018-05-27 MED ORDER — ONETOUCH DELICA LANCETS 33G MISC: 2 refills | Status: AC

## 2018-05-27 NOTE — Telephone Encounter (Signed)
Received [facility] from [facility] stating [facility] with [facility] and needs new Rx of One Touch Delica 82E lancet. Refills sent.

## 2018-06-05 ENCOUNTER — Encounter: Payer: Self-pay | Admitting: Family

## 2018-06-05 ENCOUNTER — Ambulatory Visit (INDEPENDENT_AMBULATORY_CARE_PROVIDER_SITE_OTHER): Payer: Medicare Other | Admitting: Family

## 2018-06-05 VITALS — BP 138/74 | HR 79 | Temp 98.5°F | Resp 18 | Ht 65.0 in | Wt 261.0 lb

## 2018-06-05 DIAGNOSIS — I10 Essential (primary) hypertension: Secondary | ICD-10-CM

## 2018-06-05 DIAGNOSIS — M7660 Achilles tendinitis, unspecified leg: Secondary | ICD-10-CM | POA: Diagnosis not present

## 2018-06-05 LAB — BASIC METABOLIC PANEL
BUN: 16 mg/dL (ref 6–23)
CHLORIDE: 100 meq/L (ref 96–112)
CO2: 32 mEq/L (ref 19–32)
Calcium: 9.3 mg/dL (ref 8.4–10.5)
Creatinine, Ser: 0.85 mg/dL (ref 0.40–1.20)
GFR: 72.29 mL/min (ref >60.00)
GLUCOSE: 137 mg/dL — AB (ref 70–99)
Potassium: 4.2 mEq/L (ref 3.5–5.1)
SODIUM: 136 meq/L (ref 135–145)

## 2018-06-05 NOTE — Progress Notes (Signed)
Subjective:    Patient ID: [name], female    DOB: 1956/06/04, 62 y.o.   MRN: [id number]  HPI  Patient is a 62 yr old female who presents with chief complaint of left sided heel pain. Denies known injury.  Has pain with bending of foot or with weight bearing. She is using oxycodone with some improvement in her pain.     Review of Systems See HPI  Past Medical History:  Diagnosis Date  . Anxiety   . Arthritis    knees  . Chronic fatigue syndrome   . Chronic pain syndrome    pain clinic w/ [facility]  . Dyslipidemia   . Fibromyalgia   . GERD (gastroesophageal reflux disease)   . History of transient ischemic attack (TIA) 08/2012   per pt no residual  . Hypertension   . IBS (irritable bowel syndrome)   . IC (interstitial cystitis) urologist--- dr Alona Bene [facility]  . Major depression   . Migraine   . Mild obstructive sleep apnea    per study 2005  mild osa, last used cpap 2016,  per pt needs another sleep study   . Mixed incontinence urge and stress   . Multiple thyroid nodules    bilateral --- hx benign bx 2010  . OSA (obstructive sleep apnea)    Has pending sleep study consult for April  . Osteopenia   . PONV (postoperative nausea and vomiting)   . Restless leg syndrome   . Soft tissue mass    right forearm and right thigh  . Type 2 diabetes mellitus ([place])    last A1c 7.8 on 07-15-2017 in epic  . Wears glasses      Social History   Socioeconomic History  . Marital status: Divorced    Spouse name: Not on file  . Number of children: 2  . Years of education: Not on file  . Highest education level: Not on file  Occupational History  . Occupation: disabled  Social Needs  . Financial resource strain: Not on file  . Food insecurity:    Worry: Not on file    Inability: Not on file  . Transportation needs:    Medical: Not on file    Non-medical: Not on file  Tobacco Use  . Smoking status: Former Smoker    Packs/day: 0.10    Years: 1.00    Pack years:  0.10    Types: Cigarettes    Last attempt to quit: 10/19/2012    Years since quitting: 5.6  . Smokeless tobacco: Never Used  Substance and Sexual Activity  . Alcohol use: No    Frequency: Never    Comment: rare - 4-5 drinks a year  . Drug use: No  . Sexual activity: Never  Lifestyle  . Physical activity:    Days per week: Not on file    Minutes per session: Not on file  . Stress: Not on file  Relationships  . Social connections:    Talks on phone: Not on file    Gets together: Not on file    Attends religious service: Not on file    Active member of club or organization: Not on file    Attends meetings of clubs or organizations: Not on file    Relationship status: Not on file  . Intimate partner violence:    Fear of current or ex partner: Not on file    Emotionally abused: Not on file    Physically abused: Not  on file    Forced sexual activity: Not on file  Other Topics Concern  . Not on file  Social History Narrative   Disabled   Divorced, lives alone with a cat. -    Children: 1 dtr, 1 son, 4 grandchildren      She takes fentanyl and oxycodone for pain. Does not exercise because of being disabled.    Past Surgical History:  Procedure Laterality Date  . ABDOMINAL EXPLORATION SURGERY  1983   MECKEL diverticulum removed and APPENDECTOMY and hemicolectomy  . ANTERIOR CERVICAL DECOMP/DISCECTOMY FUSION  01-04-2009   dr pool  [facility]  . BREAST CYST EXCISION Left 1978   benign  . CARDIOVASCULAR STRESS TEST  03/17/2013   normal nuclear study w/ no ischemia/  normal LV function and wall motion , ef 62%  . COLONOSCOPY  last one 08-14-2011  . DIAGNOSTIC LAPAROSCOPY  01-18-2004  dr Radene Knee; 1996; 1995  . ESOPHAGOGASTRODUODENOSCOPY  last one 09-03-2013   dr Candie Echevaria  . EXCISION LIPOMA LEFT THIGH  04-03-2004  dr Margot Chimes  . EXPLORATORY LAPAROTOMY W/ RIGHT OVARIAN CYSTECTOMY AND LYSIS ADHESIONS  1982  . LAPAROSCOPIC ASSISTED VAGINAL HYSTERECTOMY  1992  . LIPOMA EXCISION  05-16-2001;   1998;  [place]  . MASS EXCISION Right 11/06/2017   Procedure: EXCISE MASSES OF RIGHT FOREARM AND RIGHT THIGH;  Surgeon: Armandina Gemma, MD;  Location: [place];  Service: General;  Laterality: Right;  . MULTIPLE CYSTO/  HYDRODISTENTION/ INSTILLATION THERAPY  since 2001;  last one 08/ 2018   dr Amalia Hailey  [facility]  . NASAL SINUS SURGERY  2004  . RECTOCELE REPAIR  07/2008  . TONSILLECTOMY AND ADENOIDECTOMY  1970  . TOTAL KNEE ARTHROPLASTY Bilateral left 07-31-2010/  right 08-29-2010   dr Alvan Dame  [facility]  . TRANSTHORACIC ECHOCARDIOGRAM  08/23/2012   normal  w/ ef 60-65%  . VENTRAL HERNIA REPAIR  06-05-2006 dr Excell Seltzer [facility];  2001; 1992    Family History  Problem Relation Age of Onset  . Breast cancer Mother   . Dementia Mother   . Diabetes Father   . Dementia Father   . Alzheimer's disease Father   . Breast cancer Maternal Grandmother   . Throat cancer Maternal Grandmother   . Esophageal cancer Maternal Grandmother   . Breast cancer Other        maternal great aunts x 5  . Rheum arthritis Other   . Diabetes Maternal Grandfather   . Colon cancer Neg Hx     Allergies  Allergen Reactions  . Nsaids Nausea And Vomiting and Swelling    Worsening symptoms- interstitial cystitist  . Atorvastatin     Body aches, worsened interstitial cystitis. Also simvastatin  . Buprenorphine Hcl Nausea And Vomiting  . Chlorzoxazone Other (See Comments) and Nausea Only    Unknown reaction  . Darvon [Propoxyphene] Other (See Comments)    hallucinations  . Gabapentin Nausea And Vomiting  . Gemfibrozil Nausea And Vomiting  . Metronidazole Nausea And Vomiting  . Morphine And Related Nausea And Vomiting  . Nitrofurantoin Nausea And Vomiting  . Olmesartan Medoxomil-Hctz Nausea And Vomiting  . Ondansetron Nausea And Vomiting  . Pentosan Polysulfate Sodium Other (See Comments)    Unknown reaction  . Pravastatin Other (See Comments)    Body aches, worsening interstitial cystitis.  . Ramipril  Cough  . Sulfa Antibiotics Nausea And Vomiting  . Elmiron [Pentosan Polysulfate] Nausea And Vomiting and Rash    Current Outpatient Medications on File Prior  to Visit  Medication Sig Dispense Refill  . ALPRAZolam (XANAX) 1 MG tablet Take 1 tablet (1 mg total) by mouth 3 (three) times daily as needed for anxiety. (Patient taking differently: Take 1 mg by mouth 3 (three) times daily as needed for anxiety. ) 30 tablet 0  . amLODipine (NORVASC) 5 MG tablet Take 1 tablet (5 mg total) by mouth daily. (Patient taking differently: Take 5 mg by mouth every evening. ) 90 tablet 3  . aspirin EC 81 MG tablet Take 81 mg by mouth daily.    Marland Kitchen bismuth subsalicylate (PEPTO-BISMOL) 262 MG/15ML suspension Take 30 mLs by mouth every 6 (six) hours as needed. 360 mL 0  . canagliflozin (INVOKANA) 300 MG TABS tablet TAKE 1 TABLET(300 MG) BY MOUTH DAILY BEFORE BREAKFAST 30 tablet 5  . cephALEXin (KEFLEX) 500 MG capsule Take 1 capsule (500 mg total) by mouth 3 (three) times daily. 21 capsule 0  . desvenlafaxine (PRISTIQ) 100 MG 24 hr tablet TK 1 T PO D--- takes in am  0  . Diclofenac Sodium (PENNSAID) 2 % SOLN Place 1 application onto the skin at bedtime as needed (knee pain).    Marland Kitchen dicyclomine (BENTYL) 20 MG tablet TAKE 1 TABLET(20 MG) BY MOUTH EVERY 6 HOURS 30 tablet 0  . esomeprazole (NEXIUM) 40 MG capsule TAKE ONE CAPSULE BY MOUTH EVERY DAY AT NOON 90 capsule 1  . fentaNYL (DURAGESIC - DOSED MCG/HR) 75 MCG/HR Place 1 patch onto the skin every other day.  0  . furosemide (LASIX) 20 MG tablet TAKE 1 TABLET BY MOUTH DAILY (Patient taking differently: TAKE 1 TABLET BY MOUTH DAILY---  takes prn for fluid retention) 30 tablet 3  . glucose blood (ONE TOUCH ULTRA TEST) test strip USE TO TEST AS DIRECTED 100 each 1  . hydrALAZINE (APRESOLINE) 25 MG tablet TAKE 1 TABLET(25 MG) BY MOUTH THREE TIMES DAILY (FOR IC) 270 tablet 1  . lidocaine (LIDODERM) 5 % Place 2 patches onto the skin at bedtime as needed (hip pain).     Marland Kitchen  losartan (COZAAR) 100 MG tablet Take 1 tablet (100 mg total) by mouth daily. 90 tablet 1  . metFORMIN (GLUCOPHAGE-XR) 500 MG 24 hr tablet Take 2 tablets (1,000 mg total) by mouth daily with supper. 180 tablet 3  . metoprolol succinate (TOPROL-XL) 100 MG 24 hr tablet TAKE 1 TABLET BY MOUTH DAILY, TAKE WITH OR IMMEDIATELY FOLLOWING A MEAL 90 tablet 1  . mirabegron ER (MYRBETRIQ) 50 MG TB24 Take 50 mg by mouth at bedtime.     . mirtazapine (REMERON) 15 MG tablet Take 1 tablet by mouth daily.    Marland Kitchen nystatin cream (MYCOSTATIN) Apply 1 application topically 2 (two) times daily. 30 g 2  . Omega-3 Fatty Acids (FISH OIL) 1000 MG CAPS Take 3 capsules (3,000 mg total) by mouth daily. 300 capsule 11  . ONETOUCH DELICA LANCETS 69G MISC TEST BLOOD SUGAR 1 TO 2 TIMES DAILY 100 each 2  . Oxycodone HCl 10 MG TABS Take 10 mg by mouth every 8 (eight) hours as needed.   0  . pioglitazone (ACTOS) 15 MG tablet TAKE 1 TABLET(15 MG) BY MOUTH DAILY 30 tablet 5  . potassium gluconate 595 MG TABS tablet Take 595 mg by mouth daily as needed (leg cramps).    . prazosin (MINIPRESS) 2 MG capsule Take 1 capsule by mouth daily.    . promethazine (PHENERGAN) 25 MG tablet Take 25 mg by mouth daily as needed for nausea or vomiting.     Marland Kitchen  rOPINIRole (REQUIP) 4 MG tablet TAKE 1 TABLET BY MOUTH AT BEDTIME 90 tablet 1  . sitaGLIPtin (JANUVIA) 100 MG tablet Take 1 tablet (100 mg total) by mouth daily with breakfast. 90 tablet 1  . tiZANidine (ZANAFLEX) 4 MG tablet Take 1 tablet (4 mg total) by mouth at bedtime. 30 tablet 0  . topiramate (TOPAMAX) 50 MG tablet TAKE 1 TABLET BY MOUTH TWICE A DAY AS NEEDED 180 tablet 1  . traZODone (DESYREL) 100 MG tablet Take 100-300 mg by mouth at bedtime as needed. Take 2-3 tablets at bedtime as needed.    . TRULICITY 1.5 OZ/2.2QM SOPN INJECT INTO SKIN WEEKLY 2 pen 1  . valACYclovir (VALTREX) 1000 MG tablet Take 1 tablet (1,000 mg total) by mouth 3 (three) times daily. (Patient taking differently: Take  1,000 mg by mouth 3 (three) times daily. ) 30 tablet 0  . ezetimibe (ZETIA) 10 MG tablet Take 1 tablet (10 mg total) by mouth daily. 90 tablet 3  . [DISCONTINUED] pravastatin (PRAVACHOL) 20 MG tablet Take 1 tablet (20 mg total) by mouth daily. 30 tablet 3   No current facility-administered medications on file prior to visit.     BP 138/74   Pulse 79   Temp 98.5 F (36.9 C) (Oral)   Resp 18   Ht 5\' 5"  (1.651 m)   Wt 261 lb (118.4 kg)   SpO2 98%   BMI 43.43 kg/m       Objective:   Physical Exam  Constitutional: She appears well-developed and well-nourished.  Cardiovascular: Normal rate, regular rhythm and normal heart sounds.  No murmur heard. Pulmonary/Chest: Effort normal and breath sounds normal. No respiratory distress. She has no wheezes.  Musculoskeletal:  Left achilles tendon is tender to palpation,  No redness/no swelling noted of left foot/heel  Psychiatric: She has a normal mood and affect. Her behavior is normal. Judgment and thought content normal.          Assessment & Plan:  Pain left achilles- new. will refer to sports medicine for further evaluation.   HTN- bp stable on current medications. Continue same, obtain follow up bmet.  BP Readings from Last 3 Encounters:  06/05/18 138/74  04/25/18 (!) 147/63  03/03/18 118/74

## 2018-06-05 NOTE — Patient Instructions (Signed)
You should be contacted about your referral to sports medicine.

## 2018-06-06 ENCOUNTER — Ambulatory Visit: Payer: Medicare Other | Admitting: Internal Medicine

## 2018-06-06 NOTE — Progress Notes (Deleted)
Patient ID: [name], female   DOB: October 15, 1956, 62 y.o.   MRN: [id number]   HPI: [name] is a 62 y.o.-year-old female, returning for follow-up for DM2, dx in ~2012, non-insulin-dependent, uncontrolled, with complications (cerebro-vascular ds - h/o TIA 2013). Last visit 3 months ago.  Last hemoglobin A1c was: Lab Results  Component Value Date   HGBA1C 6.1 03/03/2018   HGBA1C 7.8 (H) 07/15/2017   HGBA1C 7.4 (H) 11/23/2016   Pt Was on a regimen of: - Metformin ER 2000 mg with breakfast - Januvia 100 mg at bedtime (!) - Invokana 300 mg in am before or with b'fast  She is now on: - Invokana 300 mg  before b'fast. - Trulicity 1.5mg  weekly - Metformin ER 1000 mg with dinner  She checks her sugars 1-4x a day: - am: n/c >> 96-121, 164 - 2h after b'fast: n/c >> 143, 160 - before lunch: n/c >> 148 - 2h after lunch: 219, 261 >> 122, 137  - before dinner:  174 >> 111, 129-154, 163 - 2h after dinner: 173-215, 236 >> 114-155 - bedtime: n/c >> 116-158 - nighttime: n/c >> 122-155 (sic) Lowest sugar was  96 >> ***;    it is unclear at which level she has hypoglycemia awareness. Highest sugar was 163 >> ***.  Glucometer: One Touch ultra  Pt's meals are: - Breakfast 7-10 am: raisin toast x 2 slices; iced coffee - Lunch: skips lunch - Dinner: meat + veggies; BBQ tray; hamburger + fries - usually eats out.  May have iced tea or Dr Malachi Bonds - regular (!); or cereals. - Snacks: icecream  -No CKD, last BUN/creatinine:  Lab Results  Component Value Date   BUN 16 06/05/2018   BUN 17 04/25/2018   CREATININE 0.85 06/05/2018   CREATININE 0.83 04/25/2018  On losartan 100. + HL; last set of lipids: Lab Results  Component Value Date   CHOL 187 02/03/2018   HDL 52 02/03/2018   LDLCALC 109 (H) 02/03/2018   LDLDIRECT 136.5 10/11/2009   TRIG 131 02/03/2018   CHOLHDL 3.6 02/03/2018  On  omega-3 fatty acids, Zetia - last eye exam was in 2017: No DR - no numbness and tingling in  her feet.  She has OSA >> does not wear her C-pap as her mask is not fitting well >> Causes anxiety  Multinodular goiter:  - Benign biopsy in 2011  Thyroid ultrasound (09/09/2017): enlargement of the previously biopsied complex cystic nodule in the left thyroid, but probably the result of several smaller complex cystic nodules coalescence.  The rest of the nodules were unchanged.  Pt denies: - feeling nodules in neck - hoarseness - dysphagia - choking - SOB with lying down  She has a history of interstitial cystitis - had bladder hydrodistension with Botox, IBS with diarrhea , restless leg syndrome, anxiety/depression, fibromyalgia, pelvic floor dysfunction.  ROS: Constitutional: no weight gain/no weight loss, no fatigue, no subjective hyperthermia, no subjective hypothermia Eyes: no blurry vision, no xerophthalmia ENT: no sore throat, + see HPI Cardiovascular: no CP/no SOB/no palpitations/no leg swelling Respiratory: no cough/no SOB/no wheezing Gastrointestinal: no N/no V/no D/no C/no acid reflux Musculoskeletal: no muscle aches/no joint aches Skin: no rashes, no hair loss Neurological: no tremors/no numbness/no tingling/no dizziness  I reviewed pt's medications, allergies, PMH, social hx, family hx, and changes were documented in the history of present illness. Otherwise, unchanged from my initial visit note.   Past Medical History:  Diagnosis Date  . Anxiety   .  Arthritis    knees  . Chronic fatigue syndrome   . Chronic pain syndrome    pain clinic w/ [facility]  . Dyslipidemia   . Fibromyalgia   . GERD (gastroesophageal reflux disease)   . History of transient ischemic attack (TIA) 08/2012   per pt no residual  . Hypertension   . IBS (irritable bowel syndrome)   . IC (interstitial cystitis) urologist--- dr Alona Bene [facility]  . Major depression   . Migraine   . Mild obstructive sleep apnea    per study 2005  mild osa, last used cpap 2016,  per pt needs another sleep  study   . Mixed incontinence urge and stress   . Multiple thyroid nodules    bilateral --- hx benign bx 2010  . OSA (obstructive sleep apnea)    Has pending sleep study consult for April  . Osteopenia   . PONV (postoperative nausea and vomiting)   . Restless leg syndrome   . Soft tissue mass    right forearm and right thigh  . Type 2 diabetes mellitus ([place])    last A1c 7.8 on 07-15-2017 in epic  . Wears glasses    Past Surgical History:  Procedure Laterality Date  . ABDOMINAL EXPLORATION SURGERY  1983   MECKEL diverticulum removed and APPENDECTOMY and hemicolectomy  . ANTERIOR CERVICAL DECOMP/DISCECTOMY FUSION  01-04-2009   dr pool  [facility]  . BREAST CYST EXCISION Left 1978   benign  . CARDIOVASCULAR STRESS TEST  03/17/2013   normal nuclear study w/ no ischemia/  normal LV function and wall motion , ef 62%  . COLONOSCOPY  last one 08-14-2011  . DIAGNOSTIC LAPAROSCOPY  01-18-2004  dr Radene Knee; 1996; 1995  . ESOPHAGOGASTRODUODENOSCOPY  last one 09-03-2013   dr Candie Echevaria  . EXCISION LIPOMA LEFT THIGH  04-03-2004  dr Margot Chimes  . EXPLORATORY LAPAROTOMY W/ RIGHT OVARIAN CYSTECTOMY AND LYSIS ADHESIONS  1982  . LAPAROSCOPIC ASSISTED VAGINAL HYSTERECTOMY  1992  . LIPOMA EXCISION  05-16-2001;  1998;  [place]  . MASS EXCISION Right 11/06/2017   Procedure: EXCISE MASSES OF RIGHT FOREARM AND RIGHT THIGH;  Surgeon: Armandina Gemma, MD;  Location: [place];  Service: General;  Laterality: Right;  . MULTIPLE CYSTO/  HYDRODISTENTION/ INSTILLATION THERAPY  since 2001;  last one 08/ 2018   dr Amalia Hailey  [facility]  . NASAL SINUS SURGERY  2004  . RECTOCELE REPAIR  07/2008  . TONSILLECTOMY AND ADENOIDECTOMY  1970  . TOTAL KNEE ARTHROPLASTY Bilateral left 07-31-2010/  right 08-29-2010   dr Alvan Dame  [facility]  . TRANSTHORACIC ECHOCARDIOGRAM  08/23/2012   normal  w/ ef 60-65%  . VENTRAL HERNIA REPAIR  06-05-2006 dr Excell Seltzer [facility];  2001; 1992   Social History   Social History  . Marital status: Divorced     Spouse name: N/A  . Number of children: 2   Occupational History  . disabled    Social History Main Topics  . Smoking status: Former Smoker    Packs/day: 0.10    Years: 1.00    Types: Cigarettes    Quit date: 10/19/2012  . Smokeless tobacco: Never Used     Comment: using electronic cigarettes  . Alcohol use Yes     Comment: 2 drinks per month - liquor  . Drug use: No   Social History Narrative   Disabled   Divorced, lives alone - 1 dtr, 1 son   Current Outpatient Medications on File Prior to Visit  Medication Sig Dispense Refill  . ALPRAZolam (XANAX) 1 MG tablet Take 1 tablet (1 mg total) by mouth 3 (three) times daily as needed for anxiety. (Patient taking differently: Take 1 mg by mouth 3 (three) times daily as needed for anxiety. ) 30 tablet 0  . amLODipine (NORVASC) 5 MG tablet Take 1 tablet (5 mg total) by mouth daily. (Patient taking differently: Take 5 mg by mouth every evening. ) 90 tablet 3  . aspirin EC 81 MG tablet Take 81 mg by mouth daily.    Marland Kitchen bismuth subsalicylate (PEPTO-BISMOL) 262 MG/15ML suspension Take 30 mLs by mouth every 6 (six) hours as needed. 360 mL 0  . canagliflozin (INVOKANA) 300 MG TABS tablet TAKE 1 TABLET(300 MG) BY MOUTH DAILY BEFORE BREAKFAST 30 tablet 5  . desvenlafaxine (PRISTIQ) 100 MG 24 hr tablet TK 1 T PO D--- takes in am  0  . Diclofenac Sodium (PENNSAID) 2 % SOLN Place 1 application onto the skin at bedtime as needed (knee pain).    Marland Kitchen dicyclomine (BENTYL) 20 MG tablet TAKE 1 TABLET(20 MG) BY MOUTH EVERY 6 HOURS 30 tablet 0  . esomeprazole (NEXIUM) 40 MG capsule TAKE ONE CAPSULE BY MOUTH EVERY DAY AT NOON 90 capsule 1  . ezetimibe (ZETIA) 10 MG tablet Take 1 tablet (10 mg total) by mouth daily. 90 tablet 3  . fentaNYL (DURAGESIC - DOSED MCG/HR) 75 MCG/HR Place 1 patch onto the skin every other day.  0  . furosemide (LASIX) 20 MG tablet TAKE 1 TABLET BY MOUTH DAILY (Patient taking differently: TAKE 1 TABLET BY MOUTH DAILY---  takes prn for  fluid retention) 30 tablet 3  . glucose blood (ONE TOUCH ULTRA TEST) test strip USE TO TEST AS DIRECTED 100 each 1  . hydrALAZINE (APRESOLINE) 25 MG tablet TAKE 1 TABLET(25 MG) BY MOUTH THREE TIMES DAILY (FOR IC) 270 tablet 1  . lidocaine (LIDODERM) 5 % Place 2 patches onto the skin at bedtime as needed (hip pain).     Marland Kitchen losartan (COZAAR) 100 MG tablet Take 1 tablet (100 mg total) by mouth daily. 90 tablet 1  . metFORMIN (GLUCOPHAGE-XR) 500 MG 24 hr tablet Take 2 tablets (1,000 mg total) by mouth daily with supper. 180 tablet 3  . metoprolol succinate (TOPROL-XL) 100 MG 24 hr tablet TAKE 1 TABLET BY MOUTH DAILY, TAKE WITH OR IMMEDIATELY FOLLOWING A MEAL 90 tablet 1  . mirabegron ER (MYRBETRIQ) 50 MG TB24 Take 50 mg by mouth at bedtime.     . mirtazapine (REMERON) 15 MG tablet Take 1 tablet by mouth daily.    Marland Kitchen nystatin cream (MYCOSTATIN) Apply 1 application topically 2 (two) times daily. 30 g 2  . Omega-3 Fatty Acids (FISH OIL) 1000 MG CAPS Take 3 capsules (3,000 mg total) by mouth daily. 300 capsule 11  . ONETOUCH DELICA LANCETS 28N MISC TEST BLOOD SUGAR 1 TO 2 TIMES DAILY 100 each 2  . Oxycodone HCl 10 MG TABS Take 10 mg by mouth every 8 (eight) hours as needed.   0  . pioglitazone (ACTOS) 15 MG tablet TAKE 1 TABLET(15 MG) BY MOUTH DAILY 30 tablet 5  . potassium gluconate 595 MG TABS tablet Take 595 mg by mouth daily as needed (leg cramps).    . prazosin (MINIPRESS) 2 MG capsule Take 1 capsule by mouth daily.    . promethazine (PHENERGAN) 25 MG tablet Take 25 mg by mouth daily as needed for nausea or vomiting.     Marland Kitchen rOPINIRole (REQUIP)  4 MG tablet TAKE 1 TABLET BY MOUTH AT BEDTIME 90 tablet 1  . sitaGLIPtin (JANUVIA) 100 MG tablet Take 1 tablet (100 mg total) by mouth daily with breakfast. 90 tablet 1  . tiZANidine (ZANAFLEX) 4 MG tablet Take 1 tablet (4 mg total) by mouth at bedtime. 30 tablet 0  . topiramate (TOPAMAX) 50 MG tablet TAKE 1 TABLET BY MOUTH TWICE A DAY AS NEEDED 180 tablet 1  .  traZODone (DESYREL) 100 MG tablet Take 100-300 mg by mouth at bedtime as needed. Take 2-3 tablets at bedtime as needed.    . TRULICITY 1.5 SA/6.3KZ SOPN INJECT INTO SKIN WEEKLY 2 pen 1  . [DISCONTINUED] pravastatin (PRAVACHOL) 20 MG tablet Take 1 tablet (20 mg total) by mouth daily. 30 tablet 3   No current facility-administered medications on file prior to visit.    Allergies  Allergen Reactions  . Nsaids Nausea And Vomiting and Swelling    Worsening symptoms- interstitial cystitist  . Atorvastatin     Body aches, worsened interstitial cystitis. Also simvastatin  . Buprenorphine Hcl Nausea And Vomiting  . Chlorzoxazone Other (See Comments) and Nausea Only    Unknown reaction  . Darvon [Propoxyphene] Other (See Comments)    hallucinations  . Gabapentin Nausea And Vomiting  . Gemfibrozil Nausea And Vomiting  . Metronidazole Nausea And Vomiting  . Morphine And Related Nausea And Vomiting  . Nitrofurantoin Nausea And Vomiting  . Olmesartan Medoxomil-Hctz Nausea And Vomiting  . Ondansetron Nausea And Vomiting  . Pentosan Polysulfate Sodium Other (See Comments)    Unknown reaction  . Pravastatin Other (See Comments)    Body aches, worsening interstitial cystitis.  . Ramipril Cough  . Sulfa Antibiotics Nausea And Vomiting  . Elmiron [Pentosan Polysulfate] Nausea And Vomiting and Rash   Family History  Problem Relation Age of Onset  . Breast cancer Mother   . Dementia Mother   . Diabetes Father   . Dementia Father   . Alzheimer's disease Father   . Breast cancer Maternal Grandmother   . Throat cancer Maternal Grandmother   . Esophageal cancer Maternal Grandmother   . Breast cancer Other        maternal great aunts x 5  . Rheum arthritis Other   . Diabetes Maternal Grandfather   . Colon cancer Neg Hx    PE: There were no vitals taken for this visit. Wt Readings from Last 3 Encounters:  06/05/18 261 lb (118.4 kg)  04/25/18 267 lb 9.6 oz (121.4 kg)  03/03/18 265 lb (120.2  kg)   Constitutional: overweight, in NAD Eyes: PERRLA, EOMI, no exophthalmos ENT: moist mucous membranes, + lumpy bumpy thyroid, no cervical lymphadenopathy Cardiovascular: RRR, No MRG Respiratory: CTA B Gastrointestinal: abdomen soft, NT, ND, BS+ Musculoskeletal: no deformities, strength intact in all 4 Skin: moist, warm, no rashes Neurological: no tremor with outstretched hands, DTR normal in all 4  ASSESSMENT: 1. DM2, non-insulin-dependent, uncontrolled, with complications - Cerebrovascular disease- h/o TIA in 2013  2. Thyroid nodules  3. HL  PLAN:  1. Patient with long-standing, uncontrolled, type 2 diabetes, on GLP-1 receptor agonist, and oral antidiabetic regimen with Invokana and also metformin ER added back at last visit.  At that time, we stopped Actos and Januvia.  Her sugars were much better than and an HbA1c was 6.1%.  - I suggested to:  Patient Instructions  Please continue: - Invokana 300 mg  before b'fast. - Trulicity 1.5mg  weekly - Metformin ER 1000 mg with dinner  Please  return in  4 months with your sugar log.     - today, HbA1c is 7%  - continue checking sugars at different times of the day - check 1x a day, rotating checks - advised for yearly eye exams >> she is not UTD - Return to clinic in 4 mo with sugar log    2. Thyroid nodules Reviewed her previous thyroid ultrasound from 2011 and this showed small nodules.  1 of them was biopsied and the biopsy was benign.  She had another thyroid ultrasound in 08/2017 and this showed enlargement of the previously biopsied complex cystic nodule in the left thyroid, but probably the result of an agglomeration of several smaller complex cystic nodules.  The rest of the nodules were unchanged.   - No neck compression symptoms - Plan to repeat another ultrasound in 2 years from the previous unless she develops neck compression symptoms   3.  Hyperlipidemia - Reviewed latest lipid panel from 01/2018: LDL improved from  2010, now slightly above target Lab Results  Component Value Date   CHOL 187 02/03/2018   HDL 52 02/03/2018   LDLCALC 109 (H) 02/03/2018   LDLDIRECT 136.5 10/11/2009   TRIG 131 02/03/2018   CHOLHDL 3.6 02/03/2018  - Continues omega-3 fatty acids, Zetia without side effects.  Philemon Kingdom, MD PhD [facility]

## 2018-06-09 ENCOUNTER — Encounter: Payer: Self-pay | Admitting: Family Medicine

## 2018-06-09 ENCOUNTER — Ambulatory Visit (INDEPENDENT_AMBULATORY_CARE_PROVIDER_SITE_OTHER): Payer: Medicare Other | Admitting: Family Medicine

## 2018-06-09 DIAGNOSIS — M7662 Achilles tendinitis, left leg: Secondary | ICD-10-CM | POA: Diagnosis not present

## 2018-06-09 NOTE — Patient Instructions (Signed)
You have severe insertional achilles tendinitis. Aleve 2 tabs twice a day with food for pain and inflammation for 7-10 days then as needed. Icing regularly for 15 minutes at a time - at least 3-4 times a day. Cam walker with heel lift when up and walking around. Avoid uneven ground, hills, stairs as much as possible. Follow up in 2 weeks.

## 2018-06-10 ENCOUNTER — Encounter: Payer: Self-pay | Admitting: Family Medicine

## 2018-06-10 DIAGNOSIS — M7662 Achilles tendinitis, left leg: Secondary | ICD-10-CM | POA: Insufficient documentation

## 2018-06-10 NOTE — Progress Notes (Signed)
PCP: [name], NP  Subjective:   HPI: Patient is a 62 y.o. female here for left heel pain.  Patient reports about 1 week ago she started to get posterior left heel pain. No acute injury or trauma. Pain up to 7-8/10 and sharp. Worse with walking, very achy. Can hardly move with this. Had some increase in activity prior to this. No skin changes, numbness.  Past Medical History:  Diagnosis Date  . Anxiety   . Arthritis    knees  . Chronic fatigue syndrome   . Chronic pain syndrome    pain clinic w/ [facility]  . Dyslipidemia   . Fibromyalgia   . GERD (gastroesophageal reflux disease)   . History of transient ischemic attack (TIA) 08/2012   per pt no residual  . Hypertension   . IBS (irritable bowel syndrome)   . IC (interstitial cystitis) urologist--- dr Alona Bene [facility]  . Major depression   . Migraine   . Mild obstructive sleep apnea    per study 2005  mild osa, last used cpap 2016,  per pt needs another sleep study   . Mixed incontinence urge and stress   . Multiple thyroid nodules    bilateral --- hx benign bx 2010  . OSA (obstructive sleep apnea)    Has pending sleep study consult for April  . Osteopenia   . PONV (postoperative nausea and vomiting)   . Restless leg syndrome   . Soft tissue mass    right forearm and right thigh  . Type 2 diabetes mellitus ([place])    last A1c 7.8 on 07-15-2017 in epic  . Wears glasses     Current Outpatient Medications on File Prior to Visit  Medication Sig Dispense Refill  . ALPRAZolam (XANAX) 1 MG tablet Take 1 tablet (1 mg total) by mouth 3 (three) times daily as needed for anxiety. (Patient taking differently: Take 1 mg by mouth 3 (three) times daily as needed for anxiety. ) 30 tablet 0  . amLODipine (NORVASC) 5 MG tablet Take 1 tablet (5 mg total) by mouth daily. (Patient taking differently: Take 5 mg by mouth every evening. ) 90 tablet 3  . aspirin EC 81 MG tablet Take 81 mg by mouth daily.    Marland Kitchen bismuth subsalicylate  (PEPTO-BISMOL) 262 MG/15ML suspension Take 30 mLs by mouth every 6 (six) hours as needed. 360 mL 0  . canagliflozin (INVOKANA) 300 MG TABS tablet TAKE 1 TABLET(300 MG) BY MOUTH DAILY BEFORE BREAKFAST 30 tablet 5  . desvenlafaxine (PRISTIQ) 100 MG 24 hr tablet TK 1 T PO D--- takes in am  0  . Diclofenac Sodium (PENNSAID) 2 % SOLN Place 1 application onto the skin at bedtime as needed (knee pain).    Marland Kitchen dicyclomine (BENTYL) 20 MG tablet TAKE 1 TABLET(20 MG) BY MOUTH EVERY 6 HOURS 30 tablet 0  . esomeprazole (NEXIUM) 40 MG capsule TAKE ONE CAPSULE BY MOUTH EVERY DAY AT NOON 90 capsule 1  . ezetimibe (ZETIA) 10 MG tablet Take 1 tablet (10 mg total) by mouth daily. 90 tablet 3  . fentaNYL (DURAGESIC - DOSED MCG/HR) 75 MCG/HR Place 1 patch onto the skin every other day.  0  . furosemide (LASIX) 20 MG tablet TAKE 1 TABLET BY MOUTH DAILY (Patient taking differently: TAKE 1 TABLET BY MOUTH DAILY---  takes prn for fluid retention) 30 tablet 3  . glucose blood (ONE TOUCH ULTRA TEST) test strip USE TO TEST AS DIRECTED 100 each 1  . hydrALAZINE (APRESOLINE)  25 MG tablet TAKE 1 TABLET(25 MG) BY MOUTH THREE TIMES DAILY (FOR IC) 270 tablet 1  . lidocaine (LIDODERM) 5 % Place 2 patches onto the skin at bedtime as needed (hip pain).     Marland Kitchen losartan (COZAAR) 100 MG tablet Take 1 tablet (100 mg total) by mouth daily. 90 tablet 1  . metFORMIN (GLUCOPHAGE-XR) 500 MG 24 hr tablet Take 2 tablets (1,000 mg total) by mouth daily with supper. 180 tablet 3  . metoprolol succinate (TOPROL-XL) 100 MG 24 hr tablet TAKE 1 TABLET BY MOUTH DAILY, TAKE WITH OR IMMEDIATELY FOLLOWING A MEAL 90 tablet 1  . mirabegron ER (MYRBETRIQ) 50 MG TB24 Take 50 mg by mouth at bedtime.     . mirtazapine (REMERON) 15 MG tablet Take 1 tablet by mouth daily.    Marland Kitchen nystatin cream (MYCOSTATIN) Apply 1 application topically 2 (two) times daily. 30 g 2  . Omega-3 Fatty Acids (FISH OIL) 1000 MG CAPS Take 3 capsules (3,000 mg total) by mouth daily. 300  capsule 11  . ONETOUCH DELICA LANCETS 57D MISC TEST BLOOD SUGAR 1 TO 2 TIMES DAILY 100 each 2  . Oxycodone HCl 10 MG TABS Take 10 mg by mouth every 8 (eight) hours as needed.   0  . pioglitazone (ACTOS) 15 MG tablet TAKE 1 TABLET(15 MG) BY MOUTH DAILY 30 tablet 5  . potassium gluconate 595 MG TABS tablet Take 595 mg by mouth daily as needed (leg cramps).    . prazosin (MINIPRESS) 2 MG capsule Take 1 capsule by mouth daily.    . promethazine (PHENERGAN) 25 MG tablet Take 25 mg by mouth daily as needed for nausea or vomiting.     Marland Kitchen rOPINIRole (REQUIP) 4 MG tablet TAKE 1 TABLET BY MOUTH AT BEDTIME 90 tablet 1  . sitaGLIPtin (JANUVIA) 100 MG tablet Take 1 tablet (100 mg total) by mouth daily with breakfast. 90 tablet 1  . tiZANidine (ZANAFLEX) 4 MG tablet Take 1 tablet (4 mg total) by mouth at bedtime. 30 tablet 0  . topiramate (TOPAMAX) 50 MG tablet TAKE 1 TABLET BY MOUTH TWICE A DAY AS NEEDED 180 tablet 1  . traZODone (DESYREL) 100 MG tablet Take 100-300 mg by mouth at bedtime as needed. Take 2-3 tablets at bedtime as needed.    . TRULICITY 1.5 UK/0.2RK SOPN INJECT INTO SKIN WEEKLY 2 pen 1  . [DISCONTINUED] pravastatin (PRAVACHOL) 20 MG tablet Take 1 tablet (20 mg total) by mouth daily. 30 tablet 3   No current facility-administered medications on file prior to visit.     Past Surgical History:  Procedure Laterality Date  . ABDOMINAL EXPLORATION SURGERY  1983   MECKEL diverticulum removed and APPENDECTOMY and hemicolectomy  . ANTERIOR CERVICAL DECOMP/DISCECTOMY FUSION  01-04-2009   dr pool  [facility]  . BREAST CYST EXCISION Left 1978   benign  . CARDIOVASCULAR STRESS TEST  03/17/2013   normal nuclear study w/ no ischemia/  normal LV function and wall motion , ef 62%  . COLONOSCOPY  last one 08-14-2011  . DIAGNOSTIC LAPAROSCOPY  01-18-2004  dr Radene Knee; 1996; 1995  . ESOPHAGOGASTRODUODENOSCOPY  last one 09-03-2013   dr Candie Echevaria  . EXCISION LIPOMA LEFT THIGH  04-03-2004  dr Margot Chimes  . EXPLORATORY  LAPAROTOMY W/ RIGHT OVARIAN CYSTECTOMY AND LYSIS ADHESIONS  1982  . LAPAROSCOPIC ASSISTED VAGINAL HYSTERECTOMY  1992  . LIPOMA EXCISION  05-16-2001;  1998;  [place]  . MASS EXCISION Right 11/06/2017   Procedure: EXCISE MASSES  OF RIGHT FOREARM AND RIGHT THIGH;  Surgeon: Armandina Gemma, MD;  Location: [facility];  Service: General;  Laterality: Right;  . MULTIPLE CYSTO/  HYDRODISTENTION/ INSTILLATION THERAPY  since 2001;  last one 08/ 2018   dr Amalia Hailey  [facility]  . NASAL SINUS SURGERY  2004  . RECTOCELE REPAIR  07/2008  . TONSILLECTOMY AND ADENOIDECTOMY  1970  . TOTAL KNEE ARTHROPLASTY Bilateral left 07-31-2010/  right 08-29-2010   dr Alvan Dame  [facility]  . TRANSTHORACIC ECHOCARDIOGRAM  08/23/2012   normal  w/ ef 60-65%  . VENTRAL HERNIA REPAIR  06-05-2006 dr Excell [facility];  2001; 1992    Allergies  Allergen Reactions  . Nsaids Nausea And Vomiting and Swelling    Worsening symptoms- interstitial cystitist  . Atorvastatin     Body aches, worsened interstitial cystitis. Also simvastatin  . Buprenorphine Hcl Nausea And Vomiting  . Chlorzoxazone Other (See Comments) and Nausea Only    Unknown reaction  . Darvon [Propoxyphene] Other (See Comments)    hallucinations  . Gabapentin Nausea And Vomiting  . Gemfibrozil Nausea And Vomiting  . Metronidazole Nausea And Vomiting  . Morphine And Related Nausea And Vomiting  . Nitrofurantoin Nausea And Vomiting  . Olmesartan Medoxomil-Hctz Nausea And Vomiting  . Ondansetron Nausea And Vomiting  . Pentosan Polysulfate Sodium Other (See Comments)    Unknown reaction  . Pravastatin Other (See Comments)    Body aches, worsening interstitial cystitis.  . Ramipril Cough  . Sulfa Antibiotics Nausea And Vomiting  . Elmiron [Pentosan Polysulfate] Nausea And Vomiting and Rash    Social History   Socioeconomic History  . Marital status: Divorced    Spouse name: Not on file  . Number of children: 2  . Years of education: Not on file  .  Highest education level: Not on file  Occupational History  . Occupation: disabled  Social Needs  . Financial resource strain: Not on file  . Food insecurity:    Worry: Not on file    Inability: Not on file  . Transportation needs:    Medical: Not on file    Non-medical: Not on file  Tobacco Use  . Smoking status: Former Smoker    Packs/day: 0.10    Years: 1.00    Pack years: 0.10    Types: Cigarettes    Last attempt to quit: 10/19/2012    Years since quitting: 5.6  . Smokeless tobacco: Never Used  Substance and Sexual Activity  . Alcohol use: No    Frequency: Never    Comment: rare - 4-5 drinks a year  . Drug use: No  . Sexual activity: Never  Lifestyle  . Physical activity:    Days per week: Not on file    Minutes per session: Not on file  . Stress: Not on file  Relationships  . Social connections:    Talks on phone: Not on file    Gets together: Not on file    Attends religious service: Not on file    Active member of club or organization: Not on file    Attends meetings of clubs or organizations: Not on file    Relationship status: Not on file  . Intimate partner violence:    Fear of current or ex partner: Not on file    Emotionally abused: Not on file    Physically abused: Not on file    Forced sexual activity: Not on file  Other Topics Concern  . Not on file  Social History Narrative   Disabled   Divorced, lives alone with a cat. -    Children: 1 dtr, 1 son, 4 grandchildren      She takes fentanyl and oxycodone for pain. Does not exercise because of being disabled.    Family History  Problem Relation Age of Onset  . Breast cancer Mother   . Dementia Mother   . Diabetes Father   . Dementia Father   . Alzheimer's disease Father   . Breast cancer Maternal Grandmother   . Throat cancer Maternal Grandmother   . Esophageal cancer Maternal Grandmother   . Breast cancer Other        maternal great aunts x 5  . Rheum arthritis Other   . Diabetes Maternal  Grandfather   . Colon cancer Neg Hx     BP (!) 142/75   Pulse 72   Ht 5\' 6"  (1.676 m)   Wt 261 lb (118.4 kg)   BMI 42.13 kg/m   Review of Systems: See HPI above.     Objective:  Physical Exam:  Gen: NAD, comfortable in exam room  Left foot/ankle: Haglund's deformity.  No other gross deformity, swelling, ecchymoses FROM with 5/5 strength but pain on dorsiflexion and plantarflexion TTP achilles at insertion.  No other tenderness. Negative ant drawer and talar tilt.   Negative syndesmotic compression. Negative calcaneal squeeze. Thompsons test negative. NV intact distally.  Right foot/ankle: No deformity. FROM with 5/5 strength. No tenderness to palpation. NVI distally.   MSK u/s left heel:  Mild retrocalcaneal bursitis.  Severe neovascularity within achilles tendon at insertion with swelling.  Assessment & Plan:  1. Left achilles tendinitis - insertional.  Cam walker with heel lift.  Icing, aleve twice a day regularly.  Avoid uneven ground, hills.  F/u in 2 weeks.

## 2018-06-10 NOTE — Assessment & Plan Note (Signed)
insertional.  Cam walker with heel lift.  Icing, aleve twice a day regularly.  Avoid uneven ground, hills.  F/u in 2 weeks.

## 2018-06-18 ENCOUNTER — Encounter: Payer: Self-pay | Admitting: Internal Medicine

## 2018-06-18 ENCOUNTER — Ambulatory Visit (INDEPENDENT_AMBULATORY_CARE_PROVIDER_SITE_OTHER): Payer: Medicare Other | Admitting: Internal Medicine

## 2018-06-18 ENCOUNTER — Telehealth: Payer: Self-pay | Admitting: Family

## 2018-06-18 VITALS — BP 120/84 | HR 78 | Ht 65.0 in | Wt 257.4 lb

## 2018-06-18 DIAGNOSIS — E785 Hyperlipidemia, unspecified: Secondary | ICD-10-CM | POA: Diagnosis not present

## 2018-06-18 DIAGNOSIS — E1169 Type 2 diabetes mellitus with other specified complication: Secondary | ICD-10-CM

## 2018-06-18 DIAGNOSIS — E042 Nontoxic multinodular goiter: Secondary | ICD-10-CM | POA: Diagnosis not present

## 2018-06-18 DIAGNOSIS — E1159 Type 2 diabetes mellitus with other circulatory complications: Secondary | ICD-10-CM | POA: Diagnosis not present

## 2018-06-18 LAB — POCT GLYCOSYLATED HEMOGLOBIN (HGB A1C): HEMOGLOBIN A1C: 6.4 % — AB (ref 4.0–5.6)

## 2018-06-18 MED ORDER — DULAGLUTIDE 1.5 MG/0.5ML ~~LOC~~ SOAJ: SUBCUTANEOUS | 3 refills | Status: AC

## 2018-06-18 MED ORDER — PIOGLITAZONE HCL 15 MG PO TABS: ORAL_TABLET | ORAL | 3 refills | Status: AC

## 2018-06-18 MED ORDER — CANAGLIFLOZIN 300 MG PO TABS: ORAL_TABLET | ORAL | 3 refills | Status: AC

## 2018-06-18 NOTE — Patient Instructions (Addendum)
Please continue: - Invokana 300 mg  before b'fast. - Trulicity 1.5 mg weekly - Actos 15 mg before b'fast  Please stop Januvia.  Please return in  4 months with your sugar log.

## 2018-06-18 NOTE — Telephone Encounter (Signed)
Copied from [place] [id number]. Topic: Quick Communication - Rx Refill/Question >> Jun 18, 2018  4:13 PM [name], [name] wrote: Medication: metoprolol succinate (TOPROL-XL) 100 MG 24 hr tablet  Has the patient contacted their pharmacy? No     Preferred Pharmacy (with phone number or street name): [id number] - [place], [place] - [address] AT [address] [phone] (Phone) [phone] (Fax)   Agent: Please be advised that RX refills may take up to 3 business days. We ask that you follow-up with your pharmacy.

## 2018-06-18 NOTE — Addendum Note (Signed)
Addended by: Drucilla Schmidt on: 06/18/2018 12:20 PM   Modules accepted: Orders

## 2018-06-18 NOTE — Progress Notes (Signed)
Patient ID: [name], female   DOB: 1956-05-10, 62 y.o.   MRN: [id number]   HPI: [name] is a 62 y.o.-year-old female, returning for follow-up for DM2, dx in ~2012, non-insulin-dependent, uncontrolled, with complications (cerebro-vascular ds - h/o TIA 2013). Last visit 3.5 months ago.  Since last visit, she had to stop metformin extended release that she developed diarrhea on it.  She restarted Actos low-dose.  She continues on Januvia as she did not remember the instructions to stop it when we started Trulicity.  Last hemoglobin A1c was: Lab Results  Component Value Date   HGBA1C 6.1 03/03/2018   HGBA1C 7.8 (H) 07/15/2017   HGBA1C 7.4 (H) 11/23/2016   Pt Was on a regimen of: - Metformin ER 2000 mg with breakfast - Januvia 100 mg at bedtime (!) - Invokana 300 mg in am before or with b'fast  She is now on: - Invokana 300 mg  before b'fast. - Trulicity 1.5 mg weekly - Januvia 100 mg daily (continued by mistake) - Actos 15 mg daily (instead of Metformin) Stopped Metformin ER b/c diarrhea.  She checks her sugars 1-4 times a day: - am: n/c >> 96-121, 164 >> 125 - 2h after b'fast: n/c >> 143, 160 >> 118-148, 173 - before lunch: n/c >> 148 >> 129-164 - 2h after lunch: 219, 261 >> 122, 137 >> 125-151 - before dinner:  174 >> 111, 129-154, 163 >> 117-154 - 2h after dinner: 173-215, 236 >> 114-155 >> 110-168, 176 - bedtime: n/c >> 116-158 >> 141-158 - nighttime: n/c >> 122-155  >> 148, 158 Lowest sugar was  96 >> 110;  it is unclear at which level she has hypoglycemia awareness Highest sugar was 163 >> 176.  Glucometer: One Touch ultra  Pt's meals are: - Breakfast 7-10 am: raisin toast x 2 slices; iced coffee - Lunch: skips lunch - Dinner: meat + veggies; BBQ tray; hamburger + fries - usually eats out.  May have iced tea or Dr Malachi Bonds - regular (!); or cereals. - Snacks: icecream  -No CKD, last BUN/creatinine:  Lab Results  Component Value Date   BUN 16  06/05/2018   BUN 17 04/25/2018   CREATININE 0.85 06/05/2018   CREATININE 0.83 04/25/2018  On losartan 100. + HL; last set of lipids: Lab Results  Component Value Date   CHOL 187 02/03/2018   HDL 52 02/03/2018   LDLCALC 109 (H) 02/03/2018   LDLDIRECT 136.5 10/11/2009   TRIG 131 02/03/2018   CHOLHDL 3.6 02/03/2018  O on omega-3 fatty acids, Zetia. - last eye exam was in 2017: No DR -No numbness and tingling in her feet.  She has OSA >> does not wear her C-pap as her mask is not fitting well >> causes her anxiety  Multinodular goiter:  - B benign biopsy in 2011  Thyroid ultrasound (09/09/2017): enlargement of the previously biopsied complex cystic nodule in the left thyroid, but probably the result of several smaller complex cystic nodules coalescence.  The rest of the nodules were unchanged  Pt denies: - feeling nodules in neck - hoarseness - dysphagia - choking - SOB with lying down  She has a history of interstitial cystitis - had bladder hydrodistension with Botox, IBS with diarrhea , restless leg syndrome, anxiety/depression, fibromyalgia, pelvic floor dysfunction.  ROS: Constitutional: no weight gain/no weight loss, no fatigue, no subjective hyperthermia, no subjective hypothermia, + burning with urination and nocturia (related to her interstitial cystitis) Eyes: + blurry vision, no xerophthalmia ENT:  no sore throat, + see HPI Cardiovascular: no CP/no SOB/no palpitations/no leg swelling Respiratory: no cough/no SOB/no wheezing Gastrointestinal: no N/no V/+ D with metformin/no C/no acid reflux Musculoskeletal: no muscle aches/no joint aches Skin: no rashes, no hair loss Neurological: no tremors/no numbness/no tingling/no dizziness  I reviewed pt's medications, allergies, PMH, social hx, family hx, and changes were documented in the history of present illness. Otherwise, unchanged from my initial visit note.  She stopped Wellbutrin since last visit.    Past Medical  History:  Diagnosis Date  . Anxiety   . Arthritis    knees  . Chronic fatigue syndrome   . Chronic pain syndrome    pain clinic w/ [facility]  . Dyslipidemia   . Fibromyalgia   . GERD (gastroesophageal reflux disease)   . History of transient ischemic attack (TIA) 08/2012   per pt no residual  . Hypertension   . IBS (irritable bowel syndrome)   . IC (interstitial cystitis) urologist--- dr Alona Bene [facility]  . Major depression   . Migraine   . Mild obstructive sleep apnea    per study 2005  mild osa, last used cpap 2016,  per pt needs another sleep study   . Mixed incontinence urge and stress   . Multiple thyroid nodules    bilateral --- hx benign bx 2010  . OSA (obstructive sleep apnea)    Has pending sleep study consult for April  . Osteopenia   . PONV (postoperative nausea and vomiting)   . Restless leg syndrome   . Soft tissue mass    right forearm and right thigh  . Type 2 diabetes mellitus ([place])    last A1c 7.8 on 07-15-2017 in epic  . Wears glasses    Past Surgical History:  Procedure Laterality Date  . ABDOMINAL EXPLORATION SURGERY  1983   MECKEL diverticulum removed and APPENDECTOMY and hemicolectomy  . ANTERIOR CERVICAL DECOMP/DISCECTOMY FUSION  01-04-2009   dr pool  [facility]  . BREAST CYST EXCISION Left 1978   benign  . CARDIOVASCULAR STRESS TEST  03/17/2013   normal nuclear study w/ no ischemia/  normal LV function and wall motion , ef 62%  . COLONOSCOPY  last one 08-14-2011  . DIAGNOSTIC LAPAROSCOPY  01-18-2004  dr Radene Knee; 1996; 1995  . ESOPHAGOGASTRODUODENOSCOPY  last one 09-03-2013   dr Candie Echevaria  . EXCISION LIPOMA LEFT THIGH  04-03-2004  dr Margot Chimes  . EXPLORATORY LAPAROTOMY W/ RIGHT OVARIAN CYSTECTOMY AND LYSIS ADHESIONS  1982  . LAPAROSCOPIC ASSISTED VAGINAL HYSTERECTOMY  1992  . LIPOMA EXCISION  05-16-2001;  1998;  [place]  . MASS EXCISION Right 11/06/2017   Procedure: EXCISE MASSES OF RIGHT FOREARM AND RIGHT THIGH;  Surgeon: Armandina Gemma, MD;  Location:  [place];  Service: General;  Laterality: Right;  . MULTIPLE CYSTO/  HYDRODISTENTION/ INSTILLATION THERAPY  since 2001;  last one 08/ 2018   dr Amalia Hailey  [facility]  . NASAL SINUS SURGERY  2004  . RECTOCELE REPAIR  07/2008  . TONSILLECTOMY AND ADENOIDECTOMY  1970  . TOTAL KNEE ARTHROPLASTY Bilateral left 07-31-2010/  right 08-29-2010   dr Alvan Dame  [facility]  . TRANSTHORACIC ECHOCARDIOGRAM  08/23/2012   normal  w/ ef 60-65%  . VENTRAL HERNIA REPAIR  06-05-2006 dr Excell Seltzer [facility];  2001; 1992   Social History   Social History  . Marital status: Divorced    Spouse name: N/A  . Number of children: 2   Occupational History  . disabled  Social History Main Topics  . Smoking status: Former Smoker    Packs/day: 0.10    Years: 1.00    Types: Cigarettes    Quit date: 10/19/2012  . Smokeless tobacco: Never Used     Comment: using electronic cigarettes  . Alcohol use Yes     Comment: 2 drinks per month - liquor  . Drug use: No   Social History Narrative   Disabled   Divorced, lives alone - 1 dtr, 1 son   Current Outpatient Medications on File Prior to Visit  Medication Sig Dispense Refill  . ALPRAZolam (XANAX) 1 MG tablet Take 1 tablet (1 mg total) by mouth 3 (three) times daily as needed for anxiety. (Patient taking differently: Take 1 mg by mouth 3 (three) times daily as needed for anxiety. ) 30 tablet 0  . amLODipine (NORVASC) 5 MG tablet Take 1 tablet (5 mg total) by mouth daily. (Patient taking differently: Take 5 mg by mouth every evening. ) 90 tablet 3  . aspirin EC 81 MG tablet Take 81 mg by mouth daily.    Marland Kitchen bismuth subsalicylate (PEPTO-BISMOL) 262 MG/15ML suspension Take 30 mLs by mouth every 6 (six) hours as needed. 360 mL 0  . canagliflozin (INVOKANA) 300 MG TABS tablet TAKE 1 TABLET(300 MG) BY MOUTH DAILY BEFORE BREAKFAST 30 tablet 5  . desvenlafaxine (PRISTIQ) 100 MG 24 hr tablet TK 1 T PO D--- takes in am  0  . Diclofenac Sodium (PENNSAID) 2 % SOLN Place 1 application  onto the skin at bedtime as needed (knee pain).    Marland Kitchen dicyclomine (BENTYL) 20 MG tablet TAKE 1 TABLET(20 MG) BY MOUTH EVERY 6 HOURS 30 tablet 0  . esomeprazole (NEXIUM) 40 MG capsule TAKE ONE CAPSULE BY MOUTH EVERY DAY AT NOON 90 capsule 1  . ezetimibe (ZETIA) 10 MG tablet Take 1 tablet (10 mg total) by mouth daily. 90 tablet 3  . fentaNYL (DURAGESIC - DOSED MCG/HR) 75 MCG/HR Place 1 patch onto the skin every other day.  0  . furosemide (LASIX) 20 MG tablet TAKE 1 TABLET BY MOUTH DAILY (Patient taking differently: TAKE 1 TABLET BY MOUTH DAILY---  takes prn for fluid retention) 30 tablet 3  . glucose blood (ONE TOUCH ULTRA TEST) test strip USE TO TEST AS DIRECTED 100 each 1  . hydrALAZINE (APRESOLINE) 25 MG tablet TAKE 1 TABLET(25 MG) BY MOUTH THREE TIMES DAILY (FOR IC) 270 tablet 1  . lidocaine (LIDODERM) 5 % Place 2 patches onto the skin at bedtime as needed (hip pain).     Marland Kitchen losartan (COZAAR) 100 MG tablet Take 1 tablet (100 mg total) by mouth daily. 90 tablet 1  . metFORMIN (GLUCOPHAGE-XR) 500 MG 24 hr tablet Take 2 tablets (1,000 mg total) by mouth daily with supper. 180 tablet 3  . metoprolol succinate (TOPROL-XL) 100 MG 24 hr tablet TAKE 1 TABLET BY MOUTH DAILY, TAKE WITH OR IMMEDIATELY FOLLOWING A MEAL 90 tablet 1  . mirabegron ER (MYRBETRIQ) 50 MG TB24 Take 50 mg by mouth at bedtime.     . mirtazapine (REMERON) 15 MG tablet Take 1 tablet by mouth daily.    Marland Kitchen nystatin cream (MYCOSTATIN) Apply 1 application topically 2 (two) times daily. 30 g 2  . Omega-3 Fatty Acids (FISH OIL) 1000 MG CAPS Take 3 capsules (3,000 mg total) by mouth daily. 300 capsule 11  . ONETOUCH DELICA LANCETS 53I MISC TEST BLOOD SUGAR 1 TO 2 TIMES DAILY 100 each 2  . Oxycodone HCl  10 MG TABS Take 10 mg by mouth every 8 (eight) hours as needed.   0  . pioglitazone (ACTOS) 15 MG tablet TAKE 1 TABLET(15 MG) BY MOUTH DAILY 30 tablet 5  . potassium gluconate 595 MG TABS tablet Take 595 mg by mouth daily as needed (leg cramps).     . prazosin (MINIPRESS) 2 MG capsule Take 1 capsule by mouth daily.    . promethazine (PHENERGAN) 25 MG tablet Take 25 mg by mouth daily as needed for nausea or vomiting.     Marland Kitchen rOPINIRole (REQUIP) 4 MG tablet TAKE 1 TABLET BY MOUTH AT BEDTIME 90 tablet 1  . sitaGLIPtin (JANUVIA) 100 MG tablet Take 1 tablet (100 mg total) by mouth daily with breakfast. 90 tablet 1  . tiZANidine (ZANAFLEX) 4 MG tablet Take 1 tablet (4 mg total) by mouth at bedtime. 30 tablet 0  . topiramate (TOPAMAX) 50 MG tablet TAKE 1 TABLET BY MOUTH TWICE A DAY AS NEEDED 180 tablet 1  . traZODone (DESYREL) 100 MG tablet Take 100-300 mg by mouth at bedtime as needed. Take 2-3 tablets at bedtime as needed.    . TRULICITY 1.5 DG/6.4QI SOPN INJECT INTO SKIN WEEKLY 2 pen 1  . [DISCONTINUED] pravastatin (PRAVACHOL) 20 MG tablet Take 1 tablet (20 mg total) by mouth daily. 30 tablet 3   No current facility-administered medications on file prior to visit.    Allergies  Allergen Reactions  . Nsaids Nausea And Vomiting and Swelling    Worsening symptoms- interstitial cystitist  . Atorvastatin     Body aches, worsened interstitial cystitis. Also simvastatin  . Buprenorphine Hcl Nausea And Vomiting  . Chlorzoxazone Other (See Comments) and Nausea Only    Unknown reaction  . Darvon [Propoxyphene] Other (See Comments)    hallucinations  . Gabapentin Nausea And Vomiting  . Gemfibrozil Nausea And Vomiting  . Metronidazole Nausea And Vomiting  . Morphine And Related Nausea And Vomiting  . Nitrofurantoin Nausea And Vomiting  . Olmesartan Medoxomil-Hctz Nausea And Vomiting  . Ondansetron Nausea And Vomiting  . Pentosan Polysulfate Sodium Other (See Comments)    Unknown reaction  . Pravastatin Other (See Comments)    Body aches, worsening interstitial cystitis.  . Ramipril Cough  . Sulfa Antibiotics Nausea And Vomiting  . Elmiron [Pentosan Polysulfate] Nausea And Vomiting and Rash   Family History  Problem Relation Age of  Onset  . Breast cancer Mother   . Dementia Mother   . Diabetes Father   . Dementia Father   . Alzheimer's disease Father   . Breast cancer Maternal Grandmother   . Throat cancer Maternal Grandmother   . Esophageal cancer Maternal Grandmother   . Breast cancer Other        maternal great aunts x 5  . Rheum arthritis Other   . Diabetes Maternal Grandfather   . Colon cancer Neg Hx    PE: BP 120/84   Pulse 78   Ht 5\' 5"  (1.651 m)   Wt 257 lb 6.4 oz (116.8 kg) Comment: boot on foot  SpO2 96%   BMI 42.83 kg/m  Wt Readings from Last 3 Encounters:  06/18/18 257 lb 6.4 oz (116.8 kg)  06/09/18 261 lb (118.4 kg)  06/05/18 261 lb (118.4 kg)   Constitutional: overweight, in NAD Eyes: PERRLA, EOMI, no exophthalmos ENT: moist mucous membranes, + lumpy bumpy thyroid , no cervical lymphadenopathy Cardiovascular: RRR, No MRG Respiratory: CTA B Gastrointestinal: abdomen soft, NT, ND, BS+ Musculoskeletal: no deformities, strength intact  in all 4, L foot in boot (Achilles tendonitis) Skin: moist, warm, no rashes Neurological: no tremor with outstretched hands, DTR normal in all 4  ASSESSMENT: 1. DM2, non-insulin-dependent, uncontrolled, with complications - Cerebrovascular disease- h/o TIA in 2013  2. Thyroid nodules  3. HL  PLAN:  1. Patient with long-standing, uncontrolled, type 2 diabetes, on GLP-1 receptor agonist, oral antidiabetic regimen (Invokana and metformin ER).  At last visit, her  sugars were improved, with an HbA1c of 6.1%.  At that time, we added metformin back, and I advised her to stop Actos and Januvia.  However, she could not tolerate metformin due to diarrhea, so she restarted Actos since last visit.  She continued Januvia and she did not remember to stop it at the start of Trulicity.  We will stop this now. - Her sugars are at or close to goal, however, she has slightly higher CBGs before meals and upon questioning, these are due to snacking.  We discussed about  reducing the snacks, but otherwise, no changes are needed in her regimen other than stopping Januvia. - I refilled all her diabetic medicines today - I suggested to:  Patient Instructions  Please continue: - Invokana 300 mg  before b'fast. - Trulicity 1.5 mg weekly - Actos 15 mg before b'fast  Please stop Januvia.  Please return in  4 months with your sugar log.     - today, HbA1c is 6.4% (slightly higher) - continue checking sugars at different times of the day - check 1x a day, rotating checks - advised for yearly eye exams >> she is not UTD, again advised to schedule - Return to clinic in 4 mo with sugar log    2. Thyroid nodules - Reviewed her previous thyroid ultrasound report from 2011 and this showed small nodules.  1 of the nodules was biopsied and the biopsy was benign.  She had another thyroid ultrasound in 08/2017 and this showed enlargement of the previously biopsied complex cystic nodule in the left thyroid, but probably the result of an abnormal duration of several smaller complex cystic nodules.  The rest of the nodules were unchanged. - No neck compression symptoms - Plan to repeat another ultrasound in 2 years from the previous unless she develops neck compression symptoms  3.  Hyperlipidemia - Reviewed latest lipid panel from 01/2018: LDL improved from 2010, now slightly above target Lab Results  Component Value Date   CHOL 187 02/03/2018   HDL 52 02/03/2018   LDLCALC 109 (H) 02/03/2018   LDLDIRECT 136.5 10/11/2009   TRIG 131 02/03/2018   CHOLHDL 3.6 02/03/2018  - Continues Zetia, omega 3 FAs, without side effects.   Philemon Kingdom, MD PhD [facility]

## 2018-06-19 MED ORDER — METOPROLOL SUCCINATE ER 100 MG PO TB24: ORAL_TABLET | ORAL | 1 refills | Status: DC

## 2018-06-19 NOTE — Telephone Encounter (Signed)
Attempted to contact pt regarding prescription refill request; left message on voicemail [phone] that this refill will be sent to pharmacy; however she should contact her pharmacy for refills.

## 2018-06-20 ENCOUNTER — Ambulatory Visit: Payer: Medicare Other | Admitting: Cardiology

## 2018-06-23 ENCOUNTER — Encounter: Payer: Self-pay | Admitting: Family Medicine

## 2018-06-23 ENCOUNTER — Ambulatory Visit (INDEPENDENT_AMBULATORY_CARE_PROVIDER_SITE_OTHER): Payer: Medicare Other | Admitting: Family Medicine

## 2018-06-23 VITALS — BP 172/87 | HR 73 | Ht 65.0 in | Wt 257.0 lb

## 2018-06-23 DIAGNOSIS — M7662 Achilles tendinitis, left leg: Secondary | ICD-10-CM | POA: Diagnosis not present

## 2018-06-23 NOTE — Progress Notes (Signed)
PCP: [name], NP  Subjective:   HPI: Patient is a 62 y.o. female here for left heel pain.  7/22- Patient reports about 1 week ago she started to get posterior left heel pain. No acute injury or trauma. Pain up to 7-8/10 and sharp. Worse with walking, very achy. Can hardly move with this. Had some increase in activity prior to this. No skin changes, numbness.  8/5: Patient presents for follow-up for left Achilles tendinitis.  She reports 60% improvement since her initial visit 2 weeks ago.  She reports 4/10 pain at times with ambulation.  Time she is wearing her cam walker for ambulation.  This CAM Walker also has a heel lift.  She denies any new pain, swelling, erythema, or bruising.  She denies any numbness or tingling.  Past Medical History:  Diagnosis Date  . Anxiety   . Arthritis    knees  . Chronic fatigue syndrome   . Chronic pain syndrome    pain clinic w/ [facility]  . Dyslipidemia   . Fibromyalgia   . GERD (gastroesophageal reflux disease)   . History of transient ischemic attack (TIA) 08/2012   per pt no residual  . Hypertension   . IBS (irritable bowel syndrome)   . IC (interstitial cystitis) urologist--- dr Alona [facility]  . Major depression   . Migraine   . Mild obstructive sleep apnea    per study 2005  mild osa, last used cpap 2016,  per pt needs another sleep study   . Mixed incontinence urge and stress   . Multiple thyroid nodules    bilateral --- hx benign bx 2010  . OSA (obstructive sleep apnea)    Has pending sleep study consult for April  . Osteopenia   . PONV (postoperative nausea and vomiting)   . Restless leg syndrome   . Soft tissue mass    right forearm and right thigh  . Type 2 diabetes mellitus ([place])    last A1c 7.8 on 07-15-2017 in epic  . Wears glasses     Current Outpatient Medications on File Prior to Visit  Medication Sig Dispense Refill  . ALPRAZolam (XANAX) 1 MG tablet Take 1 tablet (1 mg total) by mouth 3 (three)  times daily as needed for anxiety. (Patient taking differently: Take 1 mg by mouth 3 (three) times daily as needed for anxiety. ) 30 tablet 0  . amLODipine (NORVASC) 5 MG tablet Take 1 tablet (5 mg total) by mouth daily. (Patient taking differently: Take 5 mg by mouth every evening. ) 90 tablet 3  . aspirin EC 81 MG tablet Take 81 mg by mouth daily.    Marland Kitchen bismuth subsalicylate (PEPTO-BISMOL) 262 MG/15ML suspension Take 30 mLs by mouth every 6 (six) hours as needed. 360 mL 0  . canagliflozin (INVOKANA) 300 MG TABS tablet TAKE 1 TABLET(300 MG) BY MOUTH DAILY BEFORE BREAKFAST 90 tablet 3  . desvenlafaxine (PRISTIQ) 100 MG 24 hr tablet TK 1 T PO D--- takes in am  0  . Diclofenac Sodium (PENNSAID) 2 % SOLN Place 1 application onto the skin at bedtime as needed (knee pain).    Marland Kitchen dicyclomine (BENTYL) 20 MG tablet TAKE 1 TABLET(20 MG) BY MOUTH EVERY 6 HOURS 30 tablet 0  . Dulaglutide (TRULICITY) 1.5 YN/8.2NF SOPN INJECT INTO SKIN WEEKLY 12 pen 3  . esomeprazole (NEXIUM) 40 MG capsule TAKE ONE CAPSULE BY MOUTH EVERY DAY AT NOON 90 capsule 1  . ezetimibe (ZETIA) 10 MG tablet Take 1 tablet (  10 mg total) by mouth daily. 90 tablet 3  . fentaNYL (DURAGESIC - DOSED MCG/HR) 75 MCG/HR Place 1 patch onto the skin every other day.  0  . furosemide (LASIX) 20 MG tablet TAKE 1 TABLET BY MOUTH DAILY (Patient taking differently: TAKE 1 TABLET BY MOUTH DAILY---  takes prn for fluid retention) 30 tablet 3  . glucose blood (ONE TOUCH ULTRA TEST) test strip USE TO TEST AS DIRECTED 100 each 1  . hydrALAZINE (APRESOLINE) 25 MG tablet TAKE 1 TABLET(25 MG) BY MOUTH THREE TIMES DAILY (FOR IC) 270 tablet 1  . lidocaine (LIDODERM) 5 % Place 2 patches onto the skin at bedtime as needed (hip pain).     Marland Kitchen losartan (COZAAR) 100 MG tablet Take 1 tablet (100 mg total) by mouth daily. 90 tablet 1  . metoprolol succinate (TOPROL-XL) 100 MG 24 hr tablet TAKE 1 TABLET BY MOUTH DAILY, TAKE WITH OR IMMEDIATELY FOLLOWING A MEAL 90 tablet 1  .  mirabegron ER (MYRBETRIQ) 50 MG TB24 Take 50 mg by mouth at bedtime.     . mirtazapine (REMERON) 15 MG tablet Take 1 tablet by mouth daily.    Marland Kitchen nystatin cream (MYCOSTATIN) Apply 1 application topically 2 (two) times daily. 30 g 2  . Omega-3 Fatty Acids (FISH OIL) 1000 MG CAPS Take 3 capsules (3,000 mg total) by mouth daily. 300 capsule 11  . ONETOUCH DELICA LANCETS 58N MISC TEST BLOOD SUGAR 1 TO 2 TIMES DAILY 100 each 2  . Oxycodone HCl 10 MG TABS Take 10 mg by mouth every 8 (eight) hours as needed.   0  . pioglitazone (ACTOS) 15 MG tablet TAKE 1 TABLET(15 MG) BY MOUTH DAILY 90 tablet 3  . potassium gluconate 595 MG TABS tablet Take 595 mg by mouth daily as needed (leg cramps).    . prazosin (MINIPRESS) 2 MG capsule Take 1 capsule by mouth daily.    . promethazine (PHENERGAN) 25 MG tablet Take 25 mg by mouth daily as needed for nausea or vomiting.     Marland Kitchen REXULTI 2 MG TABS Take 1 tablet by mouth daily.  1  . rOPINIRole (REQUIP) 4 MG tablet TAKE 1 TABLET BY MOUTH AT BEDTIME 90 tablet 1  . tiZANidine (ZANAFLEX) 4 MG tablet Take 1 tablet (4 mg total) by mouth at bedtime. 30 tablet 0  . topiramate (TOPAMAX) 50 MG tablet TAKE 1 TABLET BY MOUTH TWICE A DAY AS NEEDED 180 tablet 1  . [DISCONTINUED] pravastatin (PRAVACHOL) 20 MG tablet Take 1 tablet (20 mg total) by mouth daily. 30 tablet 3   No current facility-administered medications on file prior to visit.     Past Surgical History:  Procedure Laterality Date  . ABDOMINAL EXPLORATION SURGERY  1983   MECKEL diverticulum removed and APPENDECTOMY and hemicolectomy  . ANTERIOR CERVICAL DECOMP/DISCECTOMY FUSION  01-04-2009   dr pool  [place]  . BREAST CYST EXCISION Left 1978   benign  . CARDIOVASCULAR STRESS TEST  03/17/2013   normal nuclear study w/ no ischemia/  normal LV function and wall motion , ef 62%  . COLONOSCOPY  last one 08-14-2011  . DIAGNOSTIC LAPAROSCOPY  01-18-2004  dr Radene Knee; 1996; 1995  . ESOPHAGOGASTRODUODENOSCOPY  last one  09-03-2013   dr Candie Echevaria  . EXCISION LIPOMA LEFT THIGH  04-03-2004  dr Margot Chimes  . EXPLORATORY LAPAROTOMY W/ RIGHT OVARIAN CYSTECTOMY AND LYSIS ADHESIONS  1982  . LAPAROSCOPIC ASSISTED VAGINAL HYSTERECTOMY  1992  . LIPOMA EXCISION  05-16-2001;  1998;  1993  SCALP  . MASS EXCISION Right 11/06/2017   Procedure: EXCISE MASSES OF RIGHT FOREARM AND RIGHT THIGH;  Surgeon: Armandina Gemma, MD;  Location: [place];  Service: General;  Laterality: Right;  . MULTIPLE CYSTO/  HYDRODISTENTION/ INSTILLATION THERAPY  since 2001;  last one 08/ 2018   dr Amalia Hailey  [facility]  . NASAL SINUS SURGERY  2004  . RECTOCELE REPAIR  07/2008  . TONSILLECTOMY AND ADENOIDECTOMY  1970  . TOTAL KNEE ARTHROPLASTY Bilateral left 07-31-2010/  right 08-29-2010   dr Alvan Dame  [facility]  . TRANSTHORACIC ECHOCARDIOGRAM  08/23/2012   normal  w/ ef 60-65%  . VENTRAL HERNIA REPAIR  06-05-2006 dr Excell Seltzer [facility];  2001; 1992    Allergies  Allergen Reactions  . Nsaids Nausea And Vomiting and Swelling    Worsening symptoms- interstitial cystitist  . Atorvastatin     Body aches, worsened interstitial cystitis. Also simvastatin  . Buprenorphine Hcl Nausea And Vomiting  . Chlorzoxazone Other (See Comments) and Nausea Only    Unknown reaction  . Darvon [Propoxyphene] Other (See Comments)    hallucinations  . Gabapentin Nausea And Vomiting  . Gemfibrozil Nausea And Vomiting  . Metronidazole Nausea And Vomiting  . Morphine And Related Nausea And Vomiting  . Nitrofurantoin Nausea And Vomiting  . Olmesartan Medoxomil-Hctz Nausea And Vomiting  . Ondansetron Nausea And Vomiting  . Pentosan Polysulfate Sodium Other (See Comments)    Unknown reaction  . Pravastatin Other (See Comments)    Body aches, worsening interstitial cystitis.  . Ramipril Cough  . Sulfa Antibiotics Nausea And Vomiting  . Elmiron [Pentosan Polysulfate] Nausea And Vomiting and Rash    Social History   Socioeconomic History  . Marital status: Divorced     Spouse name: Not on file  . Number of children: 2  . Years of education: Not on file  . Highest education level: Not on file  Occupational History  . Occupation: disabled  Social Needs  . Financial resource strain: Not on file  . Food insecurity:    Worry: Not on file    Inability: Not on file  . Transportation needs:    Medical: Not on file    Non-medical: Not on file  Tobacco Use  . Smoking status: Former Smoker    Packs/day: 0.10    Years: 1.00    Pack years: 0.10    Types: Cigarettes    Last attempt to quit: 10/19/2012    Years since quitting: 5.6  . Smokeless tobacco: Never Used  Substance and Sexual Activity  . Alcohol use: No    Frequency: Never    Comment: rare - 4-5 drinks a year  . Drug use: No  . Sexual activity: Never  Lifestyle  . Physical activity:    Days per week: Not on file    Minutes per session: Not on file  . Stress: Not on file  Relationships  . Social connections:    Talks on phone: Not on file    Gets together: Not on file    Attends religious service: Not on file    Active member of club or organization: Not on file    Attends meetings of clubs or organizations: Not on file    Relationship status: Not on file  . Intimate partner violence:    Fear of current or ex partner: Not on file    Emotionally abused: Not on file    Physically abused: Not on file    Forced sexual activity: Not  on file  Other Topics Concern  . Not on file  Social History Narrative   Disabled   Divorced, lives alone with a cat. -    Children: 1 dtr, 1 son, 4 grandchildren      She takes fentanyl and oxycodone for pain. Does not exercise because of being disabled.    Family History  Problem Relation Age of Onset  . Breast cancer Mother   . Dementia Mother   . Diabetes Father   . Dementia Father   . Alzheimer's disease Father   . Breast cancer Maternal Grandmother   . Throat cancer Maternal Grandmother   . Esophageal cancer Maternal Grandmother   . Breast  cancer Other        maternal great aunts x 5  . Rheum arthritis Other   . Diabetes Maternal Grandfather   . Colon cancer Neg Hx     There were no vitals taken for this visit.  Review of Systems: See HPI above.     Objective:  Physical Exam:  Gen: Awake, alert, NAD, comfortable in exam room  Left foot/ankle: Haglund's deformity present.  No erythema or bruising. Limited ROM in dorsiflexion, otherwise Full ROM with 5/5 strength.  Patient denies pain with resisted motion. Tenderness at the insertion of the Achilles.  No other tenderness on exam Negative syndesmotic compression Negative Thompson's test Neurovascularly intact   Ultrasound left Achilles tendon: Changes seen within the Achilles tendon near its insertion on the calcaneus;  Thickening, neovascularity.  Assessment & Plan:  1.  Left Achilles tendinitis, insertional- patient is improving well over the past 2 weeks. -Continue cam walker and heel lift with ambulation on an as needed basis but begin to progress to sneaker with heel lift - pt instructed on basic home strengthening progression and provided with TheraBands - Icing, aleve if needed. -Follow-up 4 weeks

## 2018-06-23 NOTE — Patient Instructions (Signed)
You have insertional achilles tendinitis. Aleve as needed. Icing 15 minutes at a time as needed. Switch from the cam walker to a regular shoe with the heel lift (you can use the cam walker if needed though). Avoid uneven ground, hills, stairs as much as possible. Theraband strengthening up to 3 sets of 10 once a day (start with yellow, advance to red when tolerated). When the band exercises are easy you can start heel lifts. Follow up in 4 weeks.

## 2018-06-24 ENCOUNTER — Encounter: Payer: Self-pay | Admitting: Family Medicine

## 2018-06-25 ENCOUNTER — Ambulatory Visit: Payer: Medicare Other | Admitting: *Deleted

## 2018-06-25 NOTE — Progress Notes (Signed)
Subjective:   [name] is a 62 y.o. female who presents for Medicare Annual (Subsequent) preventive examination.  Review of Systems: No ROS.  Medicare Wellness Visit. Additional risk factors are reflected in the social history. Cardiac Risk Factors include: advanced age (>3men, >28 women);diabetes mellitus;dyslipidemia;hypertension;obesity (BMI >30kg/m2);sedentary lifestyle Sleep patterns: Sleep 8-10 hrs, Home Safety/Smoke Alarms: Feels safe in home. Smoke alarms in place.  Living environment; residence and Firearm Safety: Lives in apt on 2nd floor. Lives alone with cat.   Female:   Pap- hysterectomy      Mammo- ordered      Dexa scan- ordered [facility]- Recall 2022 Eye-Dr.Forsey. Pt states she is past due, but will schedule appt soon.     Objective:     Vitals: BP (!) 151/67 (BP Location: Left Wrist, Patient Position: Sitting, Cuff Size: Normal)   Pulse 60   Ht 5\' 6"  (1.676 m)   Wt 258 lb 6.4 oz (117.2 kg)   SpO2 96%   BMI 41.71 kg/m   Body mass index is 41.71 kg/m.  Advanced Directives 06/27/2018 11/06/2017 06/24/2017 06/08/2016 07/29/2015 08/08/2014 08/01/2014  Does Patient Have a Medical Advance Directive? No No No No No No No  Would patient like information on creating a medical advance directive? No - Patient declined No - Patient declined No - Patient declined No - patient declined information - No - patient declined information No - patient declined information    Tobacco Social History   Tobacco Use  Smoking Status Former Smoker  . Packs/day: 0.10  . Years: 1.00  . Pack years: 0.10  . Types: Cigarettes  . Last attempt to quit: 10/19/2012  . Years since quitting: 5.6  Smokeless Tobacco Never Used     Counseling given: Not Answered   Clinical Intake: Pain : No/denies pain     Past Medical History:  Diagnosis Date  . Anxiety   . Arthritis    knees  . Chronic fatigue syndrome   . Chronic pain syndrome    pain clinic w/ [facility]  . Dyslipidemia   .  Fibromyalgia   . GERD (gastroesophageal reflux disease)   . History of transient ischemic attack (TIA) 08/2012   per pt no residual  . Hypertension   . IBS (irritable bowel syndrome)   . IC (interstitial cystitis) urologist--- dr Alona Bene [facility]  . Major depression   . Migraine   . Mild obstructive sleep apnea    per study 2005  mild osa, last used cpap 2016,  per pt needs another sleep study   . Mixed incontinence urge and stress   . Multiple thyroid nodules    bilateral --- hx benign bx 2010  . OSA (obstructive sleep apnea)    Has pending sleep study consult for April  . Osteopenia   . PONV (postoperative nausea and vomiting)   . Restless leg syndrome   . Soft tissue mass    right forearm and right thigh  . Type 2 diabetes mellitus ([place])    last A1c 7.8 on 07-15-2017 in epic  . Wears glasses    Past Surgical History:  Procedure Laterality Date  . ABDOMINAL EXPLORATION SURGERY  1983   MECKEL diverticulum removed and APPENDECTOMY and hemicolectomy  . ANTERIOR CERVICAL DECOMP/DISCECTOMY FUSION  01-04-2009   dr pool  [facility]  . BREAST CYST EXCISION Left 1978   benign  . CARDIOVASCULAR STRESS TEST  03/17/2013   normal nuclear study w/ no ischemia/  normal LV function and wall  motion , ef 62%  . COLONOSCOPY  last one 08-14-2011  . DIAGNOSTIC LAPAROSCOPY  01-18-2004  dr Radene Knee; 1996; 1995  . ESOPHAGOGASTRODUODENOSCOPY  last one 09-03-2013   dr Candie Echevaria  . EXCISION LIPOMA LEFT THIGH  04-03-2004  dr Margot Chimes  . EXPLORATORY LAPAROTOMY W/ RIGHT OVARIAN CYSTECTOMY AND LYSIS ADHESIONS  1982  . LAPAROSCOPIC ASSISTED VAGINAL HYSTERECTOMY  1992  . LIPOMA EXCISION  05-16-2001;  1998;  [place]  . MASS EXCISION Right 11/06/2017   Procedure: EXCISE MASSES OF RIGHT FOREARM AND RIGHT THIGH;  Surgeon: Armandina Gemma, MD;  Location: [place];  Service: General;  Laterality: Right;  . MULTIPLE CYSTO/  HYDRODISTENTION/ INSTILLATION THERAPY  since 2001;  last one 08/ 2018   dr  Amalia Hailey  [facility]  . NASAL SINUS SURGERY  2004  . RECTOCELE REPAIR  07/2008  . TONSILLECTOMY AND ADENOIDECTOMY  1970  . TOTAL KNEE ARTHROPLASTY Bilateral left 07-31-2010/  right 08-29-2010   dr Alvan Dame  [facility]  . TRANSTHORACIC ECHOCARDIOGRAM  08/23/2012   normal  w/ ef 60-65%  . VENTRAL HERNIA REPAIR  06-05-2006 dr Excell Seltzer [facility];  2001; 1992   Family History  Problem Relation Age of Onset  . Breast cancer Mother   . Dementia Mother   . Diabetes Father   . Dementia Father   . Alzheimer's disease Father   . Breast cancer Maternal Grandmother   . Throat cancer Maternal Grandmother   . Esophageal cancer Maternal Grandmother   . Breast cancer Other        maternal great aunts x 5  . Rheum arthritis Other   . Diabetes Maternal Grandfather   . Colon cancer Neg Hx    Social History   Socioeconomic History  . Marital status: Divorced    Spouse name: Not on file  . Number of children: 2  . Years of education: Not on file  . Highest education level: Not on file  Occupational History  . Occupation: disabled  Social Needs  . Financial resource strain: Not on file  . Food insecurity:    Worry: Not on file    Inability: Not on file  . Transportation needs:    Medical: Not on file    Non-medical: Not on file  Tobacco Use  . Smoking status: Former Smoker    Packs/day: 0.10    Years: 1.00    Pack years: 0.10    Types: Cigarettes    Last attempt to quit: 10/19/2012    Years since quitting: 5.6  . Smokeless tobacco: Never Used  Substance and Sexual Activity  . Alcohol use: No    Frequency: Never    Comment: rare - 4-5 drinks a year  . Drug use: No  . Sexual activity: Never  Lifestyle  . Physical activity:    Days per week: Not on file    Minutes per session: Not on file  . Stress: Not on file  Relationships  . Social connections:    Talks on phone: Not on file    Gets together: Not on file    Attends religious service: Not on file    Active member of club or organization: Not on  file    Attends meetings of clubs or organizations: Not on file    Relationship status: Not on file  Other Topics Concern  . Not on file  Social History Narrative   Disabled   Divorced, lives alone with a cat. -    Children: 1  dtr, 1 son, 4 grandchildren      She takes fentanyl and oxycodone for pain. Does not exercise because of being disabled.    Outpatient Encounter Medications as of 06/27/2018  Medication Sig  . ALPRAZolam (XANAX) 1 MG tablet Take 1 tablet (1 mg total) by mouth 3 (three) times daily as needed for anxiety. (Patient taking differently: Take 1 mg by mouth 3 (three) times daily as needed for anxiety. )  . amLODipine (NORVASC) 5 MG tablet Take 1 tablet (5 mg total) by mouth daily. (Patient taking differently: Take 5 mg by mouth every evening. )  . aspirin EC 81 MG tablet Take 81 mg by mouth daily.  Marland Kitchen bismuth subsalicylate (PEPTO-BISMOL) 262 MG/15ML suspension Take 30 mLs by mouth every 6 (six) hours as needed.  . canagliflozin (INVOKANA) 300 MG TABS tablet TAKE 1 TABLET(300 MG) BY MOUTH DAILY BEFORE BREAKFAST  . desvenlafaxine (PRISTIQ) 100 MG 24 hr tablet TK 1 T PO D--- takes in am  . Diclofenac Sodium (PENNSAID) 2 % SOLN Place 1 application onto the skin at bedtime as needed (knee pain).  Marland Kitchen dicyclomine (BENTYL) 20 MG tablet TAKE 1 TABLET(20 MG) BY MOUTH EVERY 6 HOURS  . Dulaglutide (TRULICITY) 1.5 VV/6.1YW SOPN INJECT INTO SKIN WEEKLY  . esomeprazole (NEXIUM) 40 MG capsule TAKE ONE CAPSULE BY MOUTH EVERY DAY AT NOON  . fentaNYL (DURAGESIC - DOSED MCG/HR) 75 MCG/HR Place 1 patch onto the skin every other day.  . furosemide (LASIX) 20 MG tablet TAKE 1 TABLET BY MOUTH DAILY (Patient taking differently: TAKE 1 TABLET BY MOUTH DAILY---  takes prn for fluid retention)  . glucose blood (ONE TOUCH ULTRA TEST) test strip USE TO TEST AS DIRECTED  . hydrALAZINE (APRESOLINE) 25 MG tablet TAKE 1 TABLET(25 MG) BY MOUTH THREE TIMES DAILY (FOR IC)  . lidocaine (LIDODERM) 5 % Place 2  patches onto the skin at bedtime as needed (hip pain).   Marland Kitchen losartan (COZAAR) 100 MG tablet Take 1 tablet (100 mg total) by mouth daily.  . metoprolol succinate (TOPROL-XL) 100 MG 24 hr tablet TAKE 1 TABLET BY MOUTH DAILY, TAKE WITH OR IMMEDIATELY FOLLOWING A MEAL  . mirabegron ER (MYRBETRIQ) 50 MG TB24 Take 50 mg by mouth at bedtime.   . mirtazapine (REMERON) 15 MG tablet Take 1 tablet by mouth daily.  Marland Kitchen nystatin cream (MYCOSTATIN) Apply 1 application topically 2 (two) times daily.  . Omega-3 Fatty Acids (FISH OIL) 1000 MG CAPS Take 3 capsules (3,000 mg total) by mouth daily.  Glory Rosebush DELICA LANCETS 73X MISC TEST BLOOD SUGAR 1 TO 2 TIMES DAILY  . Oxycodone HCl 10 MG TABS Take 10 mg by mouth every 8 (eight) hours as needed.   . pioglitazone (ACTOS) 15 MG tablet TAKE 1 TABLET(15 MG) BY MOUTH DAILY  . potassium gluconate 595 MG TABS tablet Take 595 mg by mouth daily as needed (leg cramps).  . prazosin (MINIPRESS) 2 MG capsule Take 1 capsule by mouth daily.  . promethazine (PHENERGAN) 25 MG tablet Take 25 mg by mouth daily as needed for nausea or vomiting.   Marland Kitchen REXULTI 2 MG TABS Take 1 tablet by mouth daily.  Marland Kitchen rOPINIRole (REQUIP) 4 MG tablet TAKE 1 TABLET BY MOUTH AT BEDTIME  . tiZANidine (ZANAFLEX) 4 MG tablet Take 1 tablet (4 mg total) by mouth at bedtime.  . topiramate (TOPAMAX) 50 MG tablet TAKE 1 TABLET BY MOUTH TWICE A DAY AS NEEDED  . ezetimibe (ZETIA) 10 MG tablet Take 1 tablet (  10 mg total) by mouth daily.  . [DISCONTINUED] pravastatin (PRAVACHOL) 20 MG tablet Take 1 tablet (20 mg total) by mouth daily.   No facility-administered encounter medications on file as of 06/27/2018.     Activities of Daily Living In your present state of health, do you have any difficulty performing the following activities: 06/27/2018 11/06/2017  Hearing? N N  Vision? N N  Difficulty concentrating or making decisions? N N  Walking or climbing stairs? N N  Dressing or bathing? N N  Doing errands,  shopping? N -  Preparing Food and eating ? N -  Using the Toilet? N -  In the past six months, have you accidently leaked urine? N -  Do you have problems with loss of bowel control? N -  Managing your Medications? N -  Managing your Finances? N -  Housekeeping or managing your Housekeeping? N -  Some recent data might be hidden    Patient Care Team: [name], NP as PCP - General (Internal Medicine) Martinique, Peter M, MD as PCP - Cardiology (Cardiology) Domingo Pulse, MD as Consulting Physician (Urology) Jodi Marble, MD as Consulting Physician (Otolaryngology) Arvella Nigh, MD as Consulting Physician (Obstetrics and Gynecology) Paralee Cancel, MD as Consulting Physician (Orthopedic Surgery) Rainwater, Willey Blade, PA-C as Physician Assistant (Physician Assistant) Cvejin, Sheppard Coil, MD as Referring Physician (Psychiatry)    Assessment:   This is a routine wellness examination for [name]. Physical assessment deferred to PCP.  Exercise Activities and Dietary recommendations Current Exercise Habits: The patient does not participate in regular exercise at present, Exercise limited by: None identified Diet (meal preparation, eat out, water intake, caffeinated beverages, dairy products, fruits and vegetables):  Breakfast: cheerios and 2% milk. Coffee Lunch: skip Dinner: t.v dinner.  Drinks about 6 waters per day.  Goals    . Lose 10lbs-goal is 245lb (pt-stated)     Exercise more- use the pool and exercise 1x per week. Eat healthier. Include more protein and vegtables. Continue drinking water. Be aware of the amount of sodium and carbohydrates that you take in.       Fall Risk Fall Risk  06/27/2018 06/24/2017 03/13/2016 08/06/2013 08/06/2013  Falls in the past year? Yes No No Yes Yes  Number falls in past yr: 1 - - 1 -  Risk for fall due to : - - - - History of fall(s)  Follow up Education provided;Falls prevention discussed - - - -    Depression Screen PHQ 2/9  Scores 06/27/2018 06/24/2017 04/08/2017 03/13/2016  PHQ - 2 Score 6 6 6 5   PHQ- 9 Score 15 15 26 11      Cognitive Function MMSE - Mini Mental State Exam 06/27/2018  Orientation to time 5  Orientation to Place 5  Registration 3  Attention/ Calculation 5  Recall 3  Language- name 2 objects 2  Language- repeat 1  Language- follow 3 step command 3  Language- read & follow direction 1  Write a sentence 1  Copy design 1  Total score 30        Immunization History  Administered Date(s) Administered  . Influenza Split 08/03/2011, 07/29/2012  . Influenza Whole 08/19/2009  . Influenza,inj,Quad PF,6+ Mos 07/31/2013, 08/02/2014, 08/14/2016, 08/16/2017  . Influenza-Unspecified 08/30/2015  . Pneumococcal Polysaccharide-23 09/24/2012, 10/25/2017  . Tdap 09/24/2012  . Zoster 05/11/2015  . Zoster Recombinat (Shingrix) 02/18/2017, 04/29/2017   Screening Tests Health Maintenance  Topic Date Due  . FOOT EXAM  11/23/2017  . INFLUENZA VACCINE  06/19/2018  . OPHTHALMOLOGY EXAM  04/26/2019 (Originally 06/19/2017)  . MAMMOGRAM  11/03/2018  . HEMOGLOBIN A1C  12/19/2018  . COLONOSCOPY  08/13/2021  . TETANUS/TDAP  09/24/2022  . PNEUMOCOCCAL POLYSACCHARIDE VACCINE  Completed  . Hepatitis C Screening  Completed  . HIV Screening  Completed      Plan:    Please schedule your next medicare wellness visit with me in 1 yr.  Continue to eat heart healthy diet (full of fruits, vegetables, whole grains, lean protein, water--limit salt, fat, and sugar intake) and increase physical activity as tolerated.  Continue doing brain stimulating activities (puzzles, reading, adult coloring books, staying active) to keep memory sharp.   Please schedule eye exam as discussed.  I have ordered your mammogram and bone density scan. They will call you or you can stop by to schedule.  I have personally reviewed and noted the following in the patient's chart:   . Medical and social history . Use of alcohol, tobacco or  illicit drugs  . Current medications and supplements . Functional ability and status . Nutritional status . Physical activity . Advanced directives . List of other physicians . Hospitalizations, surgeries, and ER visits in previous 12 months . Vitals . Screenings to include cognitive, depression, and falls . Referrals and appointments  In addition, I have reviewed and discussed with patient certain preventive protocols, quality metrics, and best practice recommendations. A written personalized care plan for preventive services as well as general preventive health recommendations were provided to patient.     Shela Nevin, [place]  06/27/2018

## 2018-06-27 ENCOUNTER — Ambulatory Visit (INDEPENDENT_AMBULATORY_CARE_PROVIDER_SITE_OTHER): Payer: Medicare Other | Admitting: *Deleted

## 2018-06-27 ENCOUNTER — Encounter: Payer: Self-pay | Admitting: *Deleted

## 2018-06-27 VITALS — BP 151/67 | HR 60 | Ht 66.0 in | Wt 258.4 lb

## 2018-06-27 DIAGNOSIS — Z0000 Encounter for general adult medical examination without abnormal findings: Secondary | ICD-10-CM | POA: Diagnosis not present

## 2018-06-27 DIAGNOSIS — Z780 Asymptomatic menopausal state: Secondary | ICD-10-CM

## 2018-06-27 DIAGNOSIS — Z1239 Encounter for other screening for malignant neoplasm of breast: Secondary | ICD-10-CM

## 2018-06-27 NOTE — Progress Notes (Signed)
Reviewed and agree.  Halina Asano S O'Sullivan NP 

## 2018-06-27 NOTE — Patient Instructions (Signed)
Please schedule your next medicare wellness visit with me in 1 yr.  Continue to eat heart healthy diet (full of fruits, vegetables, whole grains, lean protein, water--limit salt, fat, and sugar intake) and increase physical activity as tolerated.  Continue doing brain stimulating activities (puzzles, reading, adult coloring books, staying active) to keep memory sharp.   Please schedule eye exam as discussed.  I have ordered your mammogram and bone density scan. They will call you or you can stop by to schedule.   [name] , Thank you for taking time to come for your Medicare Wellness Visit. I appreciate your ongoing commitment to your health goals. Please review the following plan we discussed and let me know if I can assist you in the future.   These are the goals we discussed: Goals    . Lose 10lbs-goal is 245lb (pt-stated)     Exercise more- use the pool and exercise 1x per week. Eat healthier. Include more protein and vegtables. Continue drinking water. Be aware of the amount of sodium and carbohydrates that you take in.       This is a list of the screening recommended for you and due dates:  Health Maintenance  Topic Date Due  . Complete foot exam   11/23/2017  . Flu Shot  06/19/2018  . Eye exam for diabetics  04/26/2019*  . Mammogram  11/03/2018  . Hemoglobin A1C  12/19/2018  . Colon Cancer Screening  08/13/2021  . Tetanus Vaccine  09/24/2022  . Pneumococcal vaccine  Completed  .  Hepatitis C: One time screening is recommended by Center for Disease Control  (CDC) for  adults born from [birth year] through 1964.   Completed  . HIV Screening  Completed  *Topic was postponed. The date shown is not the original due date.    Health Maintenance for Postmenopausal Women Menopause is a normal process in which your reproductive ability comes to an end. This process happens gradually over a span of months to years, usually between the ages of 56 and 54. Menopause is complete when  you have missed 12 consecutive menstrual periods. It is important to talk with your health care provider about some of the most common conditions that affect postmenopausal women, such as heart disease, cancer, and bone loss (osteoporosis). Adopting a healthy lifestyle and getting preventive care can help to promote your health and wellness. Those actions can also lower your chances of developing some of these common conditions. What should I know about menopause? During menopause, you may experience a number of symptoms, such as:  Moderate-to-severe hot flashes.  Night sweats.  Decrease in sex drive.  Mood swings.  Headaches.  Tiredness.  Irritability.  Memory problems.  Insomnia.  Choosing to treat or not to treat menopausal changes is an individual decision that you make with your health care provider. What should I know about hormone replacement therapy and supplements? Hormone therapy products are effective for treating symptoms that are associated with menopause, such as hot flashes and night sweats. Hormone replacement carries certain risks, especially as you become older. If you are thinking about using estrogen or estrogen with progestin treatments, discuss the benefits and risks with your health care provider. What should I know about heart disease and stroke? Heart disease, heart attack, and stroke become more likely as you age. This may be due, in part, to the hormonal changes that your body experiences during menopause. These can affect how your body processes dietary fats, triglycerides, and cholesterol. Heart  attack and stroke are both medical emergencies. There are many things that you can do to help prevent heart disease and stroke:  Have your blood pressure checked at least every 1-2 years. High blood pressure causes heart disease and increases the risk of stroke.  If you are 102-77 years old, ask your health care provider if you should take aspirin to prevent a heart  attack or a stroke.  Do not use any tobacco products, including cigarettes, chewing tobacco, or electronic cigarettes. If you need help quitting, ask your health care provider.  It is important to eat a healthy diet and maintain a healthy weight. ? Be sure to include plenty of vegetables, fruits, low-fat dairy products, and lean protein. ? Avoid eating foods that are high in solid fats, added sugars, or salt (sodium).  Get regular exercise. This is one of the most important things that you can do for your health. ? Try to exercise for at least 150 minutes each week. The type of exercise that you do should increase your heart rate and make you sweat. This is known as moderate-intensity exercise. ? Try to do strengthening exercises at least twice each week. Do these in addition to the moderate-intensity exercise.  Know your numbers.Ask your health care provider to check your cholesterol and your blood glucose. Continue to have your blood tested as directed by your health care provider.  What should I know about cancer screening? There are several types of cancer. Take the following steps to reduce your risk and to catch any cancer development as early as possible. Breast Cancer  Practice breast self-awareness. ? This means understanding how your breasts normally appear and feel. ? It also means doing regular breast self-exams. Let your health care provider know about any changes, no matter how small.  If you are 45 or older, have a clinician do a breast exam (clinical breast exam or CBE) every year. Depending on your age, family history, and medical history, it may be recommended that you also have a yearly breast X-ray (mammogram).  If you have a family history of breast cancer, talk with your health care provider about genetic screening.  If you are at high risk for breast cancer, talk with your health care provider about having an MRI and a mammogram every year.  Breast cancer (BRCA)  gene test is recommended for women who have family members with BRCA-related cancers. Results of the assessment will determine the need for genetic counseling and BRCA1 and for BRCA2 testing. BRCA-related cancers include these types: ? Breast. This occurs in males or females. ? Ovarian. ? Tubal. This may also be called fallopian tube cancer. ? Cancer of the abdominal or pelvic lining (peritoneal cancer). ? Prostate. ? Pancreatic.  Cervical, Uterine, and Ovarian Cancer Your health care provider may recommend that you be screened regularly for cancer of the pelvic organs. These include your ovaries, uterus, and vagina. This screening involves a pelvic exam, which includes checking for microscopic changes to the surface of your cervix (Pap test).  For women ages 21-65, health care providers may recommend a pelvic exam and a Pap test every three years. For women ages 60-65, they may recommend the Pap test and pelvic exam, combined with testing for human papilloma virus (HPV), every five years. Some types of HPV increase your risk of cervical cancer. Testing for HPV may also be done on women of any age who have unclear Pap test results.  Other health care providers may not recommend  any screening for nonpregnant women who are considered low risk for pelvic cancer and have no symptoms. Ask your health care provider if a screening pelvic exam is right for you.  If you have had past treatment for cervical cancer or a condition that could lead to cancer, you need Pap tests and screening for cancer for at least 20 years after your treatment. If Pap tests have been discontinued for you, your risk factors (such as having a new sexual partner) need to be reassessed to determine if you should start having screenings again. Some women have medical problems that increase the chance of getting cervical cancer. In these cases, your health care provider may recommend that you have screening and Pap tests more  often.  If you have a family history of uterine cancer or ovarian cancer, talk with your health care provider about genetic screening.  If you have vaginal bleeding after reaching menopause, tell your health care provider.  There are currently no reliable tests available to screen for ovarian cancer.  Lung Cancer Lung cancer screening is recommended for adults 32-6 years old who are at high risk for lung cancer because of a history of smoking. A yearly low-dose CT scan of the lungs is recommended if you:  Currently smoke.  Have a history of at least 30 pack-years of smoking and you currently smoke or have quit within the past 15 years. A pack-year is smoking an average of one pack of cigarettes per day for one year.  Yearly screening should:  Continue until it has been 15 years since you quit.  Stop if you develop a health problem that would prevent you from having lung cancer treatment.  Colorectal Cancer  This type of cancer can be detected and can often be prevented.  Routine colorectal cancer screening usually begins at age 58 and continues through age 1.  If you have risk factors for colon cancer, your health care provider may recommend that you be screened at an earlier age.  If you have a family history of colorectal cancer, talk with your health care provider about genetic screening.  Your health care provider may also recommend using home test kits to check for hidden blood in your stool.  A small camera at the end of a tube can be used to examine your colon directly (sigmoidoscopy or colonoscopy). This is done to check for the earliest forms of colorectal cancer.  Direct examination of the colon should be repeated every 5-10 years until age 33. However, if early forms of precancerous polyps or small growths are found or if you have a family history or genetic risk for colorectal cancer, you may need to be screened more often.  Skin Cancer  Check your skin from head  to toe regularly.  Monitor any moles. Be sure to tell your health care provider: ? About any new moles or changes in moles, especially if there is a change in a mole's shape or color. ? If you have a mole that is larger than the size of a pencil eraser.  If any of your family members has a history of skin cancer, especially at a young age, talk with your health care provider about genetic screening.  Always use sunscreen. Apply sunscreen liberally and repeatedly throughout the day.  Whenever you are outside, protect yourself by wearing long sleeves, pants, a wide-brimmed hat, and sunglasses.  What should I know about osteoporosis? Osteoporosis is a condition in which bone destruction happens more quickly  than new bone creation. After menopause, you may be at an increased risk for osteoporosis. To help prevent osteoporosis or the bone fractures that can happen because of osteoporosis, the following is recommended:  If you are 52-13 years old, get at least 1,000 mg of calcium and at least 600 mg of vitamin D per day.  If you are older than age 73 but younger than age 53, get at least 1,200 mg of calcium and at least 600 mg of vitamin D per day.  If you are older than age 55, get at least 1,200 mg of calcium and at least 800 mg of vitamin D per day.  Smoking and excessive alcohol intake increase the risk of osteoporosis. Eat foods that are rich in calcium and vitamin D, and do weight-bearing exercises several times each week as directed by your health care provider. What should I know about how menopause affects my mental health? Depression may occur at any age, but it is more common as you become older. Common symptoms of depression include:  Low or sad mood.  Changes in sleep patterns.  Changes in appetite or eating patterns.  Feeling an overall lack of motivation or enjoyment of activities that you previously enjoyed.  Frequent crying spells.  Talk with your health care provider if  you think that you are experiencing depression. What should I know about immunizations? It is important that you get and maintain your immunizations. These include:  Tetanus, diphtheria, and pertussis (Tdap) booster vaccine.  Influenza every year before the flu season begins.  Pneumonia vaccine.  Shingles vaccine.  Your health care provider may also recommend other immunizations. This information is not intended to replace advice given to you by your health care provider. Make sure you discuss any questions you have with your health care provider. Document Released: 12/28/2005 Document Revised: 05/25/2016 Document Reviewed: 08/09/2015 Elsevier Interactive Patient Education  2018 [employer].

## 2018-07-02 ENCOUNTER — Telehealth: Payer: Self-pay | Admitting: Family

## 2018-07-02 NOTE — Telephone Encounter (Signed)
Copied from [place] [id number]. Topic: Quick Communication - Rx Refill/Question >> Jul 02, 2018  5:50 PM Waylan Rocher, [place] L wrote: Medication: dicyclomine (BENTYL) 20 MG tablet   Has the patient contacted their pharmacy? Yes.   (Agent: If no, request that the patient contact the pharmacy for the refill.) (Agent: If yes, when and what did the pharmacy advise?)  Preferred Pharmacy (with phone number or street name): [id number] - [place], [place] [address] [place] [address] Phone: [phone] Fax: [phone]   Agent: Please be advised that RX refills may take up to 3 business days. We ask that you follow-up with your pharmacy.

## 2018-07-03 ENCOUNTER — Ambulatory Visit (INDEPENDENT_AMBULATORY_CARE_PROVIDER_SITE_OTHER): Payer: Medicare Other | Admitting: Pulmonary Disease

## 2018-07-03 ENCOUNTER — Encounter: Payer: Self-pay | Admitting: Pulmonary Disease

## 2018-07-03 VITALS — BP 124/78 | HR 74 | Ht 65.0 in | Wt 260.0 lb

## 2018-07-03 DIAGNOSIS — G4733 Obstructive sleep apnea (adult) (pediatric): Secondary | ICD-10-CM | POA: Diagnosis not present

## 2018-07-03 DIAGNOSIS — Z6841 Body Mass Index (BMI) 40.0 and over, adult: Secondary | ICD-10-CM | POA: Diagnosis not present

## 2018-07-03 NOTE — Patient Instructions (Signed)
Will arrange for home sleep study Will call to arrange for follow up after sleep study reviewed  

## 2018-07-03 NOTE — Progress Notes (Signed)
[facility]  Chief Complaint  Patient presents with  . sleep consult    referred by [name] for possible sleep apnea    Constitutional: BP 124/78 (BP Location: Left Arm, Cuff Size: Normal)   Pulse 74   Ht 5\' 5"  (1.651 m)   Wt 260 lb (117.9 kg)   SpO2 97%   BMI 43.27 kg/m   History of Present Illness: [name] is a 62 y.o. female with obstructive sleep apnea.  She had a sleep study in 2005.  Found to have mild sleep apnea.  She was on CPAP until about 5 yrs ago.  She felt that CPAP helped and only issue was mask fit.  She had nasal mask.  Her machine wore out and she never got around to getting a new machine.  She lives alone.  She isn't sure if she snores or stops breathing while asleep.  He can fall asleep easily during the day while watching TV.  She goes to sleep at 10 pm.  She falls asleep in 5 minutes.  She wakes up some times to use the bathroom.  She gets out of bed at 10 am.  She feels okay in the morning.  She denies morning headache.  She does not use anything to help herstay awake.  She takes several medications at night for her mood and these help with her sleep also.  She takes these medicines at 730 pm.  She is followed by Dr. Altamese Dilling with psychiatry.  She denies sleep walking, sleep talking, bruxism, or nightmares.  There is no history of restless legs.  She denies sleep hallucinations, sleep paralysis, or cataplexy.  The Epworth score is 4 out of 24.  Comprehensive Respiratory Exam:  Appearance - well kempt  ENMT - nasal mucosa moist, turbinates clear, midline nasal septum, no dental lesions, no gingival bleeding, no oral exudates, no tonsillar hypertrophy Neck - no masses, trachea midline, no thyromegaly, no elevation in JVP Respiratory - normal appearance of chest wall, normal respiratory effort w/o accessory muscle use, no dullness on percussion, no wheezing or rales CV - s1s2 regular rate and  rhythm, no murmurs, no peripheral edema, radial pulses symmetric GI - soft, non tender, no masses, no hepatosplenomegaly Lymph - no adenopathy noted in neck and axillary areas MSK - normal muscle strength and tone, normal gait Ext - no cyanosis, clubbing, or joint inflammation noted Skin - no rashes, lesions, or ulcers Neuro - oriented to person, place, and time Psych - flat affect, delayed processing  Discussion: She has prior history of obstructive sleep apnea.  She has history of depression, diabetes, and hypertension.  Her BMI is > 35.  I am concerned she could still have sleep apnea.  Assessment/Plan:  Obstructive sleep apnea. - will need to arrange for a home sleep study  Obesity. - discussed how weight can impact sleep and risk for sleep disordered breathing - discussed options to assist with weight loss: combination of diet modification, cardiovascular and strength training exercises  Cardiovascular risk. - had an extensive discussion regarding the adverse health consequences related to untreated sleep disordered breathing - specifically discussed the risks for hypertension, coronary artery disease, cardiac dysrhythmias, cerebrovascular disease, and diabetes - lifestyle modification discussed  Safe driving practices. - discussed how sleep disruption can increase risk of accidents, particularly when driving - safe driving practices were discussed  Therapies for obstructive sleep apnea. - if the sleep study shows significant sleep apnea, then  various therapies for treatment were reviewed: CPAP, oral appliance, and surgical interventions    Patient Instructions  Will arrange for home sleep study Will call to arrange for follow up after sleep study reviewed     Chesley Mires, MD [place] 07/03/2018, 11:24 AM  Flow Sheet  Sleep tests: PSG 05/08/04 >> RDI 11  Cardiac tests: Echo 08/23/12 >> EF 60 to 65%  Review of Systems: Constitutional:  Negative.   HENT: Negative.   Eyes: Negative.   Respiratory: Negative.   Cardiovascular: Negative.   Gastrointestinal: Negative.   Endocrine: Negative.   Genitourinary: Negative.   Musculoskeletal: Positive for joint swelling.  Skin: Negative.   Allergic/Immunologic: Negative.   Neurological: Negative.   Hematological: Negative.   Psychiatric/Behavioral: Negative.    Past Medical History: She  has a past medical history of Anxiety, Arthritis, Chronic fatigue syndrome, Chronic pain syndrome, Dyslipidemia, Fibromyalgia, GERD (gastroesophageal reflux disease), History of transient ischemic attack (TIA) (08/2012), Hypertension, IBS (irritable bowel syndrome), IC (interstitial cystitis) (urologist--- dr Alona Bene [facility]), Major depression, Migraine, Mild obstructive sleep apnea, Mixed incontinence urge and stress, Multiple thyroid nodules, OSA (obstructive sleep apnea), Osteopenia, PONV (postoperative nausea and vomiting), Restless leg syndrome, Soft tissue mass, Type 2 diabetes mellitus ([place]), and Wears glasses.  Past Surgical History: She  has a past surgical history that includes Total knee arthroplasty (Bilateral, left 07-31-2010/  right 08-29-2010   dr Alvan Dame  [facility]); Abdominal exploration surgery (8299); Nasal sinus surgery (2004); Rectocele repair (07/2008); Tonsillectomy and adenoidectomy (1970); Ventral hernia repair (06-05-2006 dr Excell Seltzer [facility];  2001; 1992); Laparoscopic assisted vaginal hysterectomy (1992); EXPLORATORY LAPAROTOMY W/ RIGHT OVARIAN CYSTECTOMY AND LYSIS ADHESIONS (1982); Lipoma excision (05-16-2001;  1998;  1993); Anterior cervical decomp/discectomy fusion (01-04-2009   dr pool  [facility]); EXCISION LIPOMA LEFT THIGH (04-03-2004  dr Margot Chimes); MULTIPLE CYSTO/  HYDRODISTENTION/ INSTILLATION THERAPY (since 2001;  last one 08/ 2018   dr Amalia Hailey  [facility]); Esophagogastroduodenoscopy (last one 09-03-2013   dr Candie Echevaria); Colonoscopy (last one 08-14-2011); transthoracic echocardiogram (08/23/2012);  Cardiovascular stress test (03/17/2013); Breast cyst excision (Left, 1978); Diagnostic laparoscopy (01-18-2004  dr Radene Knee; 1996; 1995); and Mass excision (Right, 11/06/2017).  Family History: Her family history includes Alzheimer's disease in her father; Breast cancer in her maternal grandmother, mother, and other; Dementia in her father and mother; Diabetes in her father and maternal grandfather; Esophageal cancer in her maternal grandmother; Rheum arthritis in her other; Throat cancer in her maternal grandmother.  Social History: She  reports that she quit smoking about 5 years ago. Her smoking use included cigarettes. She has a 0.10 pack-year smoking history. She has never used smokeless tobacco. She reports that she does not drink alcohol or use drugs.  Medications: Allergies as of 07/03/2018      Reactions   Nsaids Nausea And Vomiting, Swelling   Worsening symptoms- interstitial cystitist   Atorvastatin    Body aches, worsened interstitial cystitis. Also simvastatin   Buprenorphine Hcl Nausea And Vomiting   Chlorzoxazone Other (See Comments), Nausea Only   Unknown reaction   Darvon [propoxyphene] Other (See Comments)   hallucinations   Gabapentin Nausea And Vomiting   Gemfibrozil Nausea And Vomiting   Metronidazole Nausea And Vomiting   Morphine And Related Nausea And Vomiting   Nitrofurantoin Nausea And Vomiting   Olmesartan Medoxomil-hctz Nausea And Vomiting   Ondansetron Nausea And Vomiting   Pentosan Polysulfate Sodium Other (See Comments)   Unknown reaction   Pravastatin Other (See Comments)   Body aches, worsening interstitial cystitis.  Ramipril Cough   Sulfa Antibiotics Nausea And Vomiting   Elmiron [pentosan Polysulfate] Nausea And Vomiting, Rash      Medication List        Accurate as of 07/03/18 11:24 AM. Always use your most recent med list.          ALPRAZolam 1 MG tablet Commonly known as:  XANAX Take 1 tablet (1 mg total) by mouth 3 (three) times  daily as needed for anxiety.   amLODipine 5 MG tablet Commonly known as:  NORVASC Take 1 tablet (5 mg total) by mouth daily.   aspirin EC 81 MG tablet Take 81 mg by mouth daily.   bismuth subsalicylate 485 IO/27OJ suspension Commonly known as:  PEPTO BISMOL Take 30 mLs by mouth every 6 (six) hours as needed.   canagliflozin 300 MG Tabs tablet Commonly known as:  INVOKANA TAKE 1 TABLET(300 MG) BY MOUTH DAILY BEFORE BREAKFAST   desvenlafaxine 100 MG 24 hr tablet Commonly known as:  PRISTIQ TK 1 T PO D--- takes in am   dicyclomine 20 MG tablet Commonly known as:  BENTYL TAKE 1 TABLET(20 MG) BY MOUTH EVERY 6 HOURS   Dulaglutide 1.5 MG/0.5ML Sopn INJECT INTO SKIN WEEKLY   esomeprazole 40 MG capsule Commonly known as:  NEXIUM TAKE ONE CAPSULE BY MOUTH EVERY DAY AT NOON   ezetimibe 10 MG tablet Commonly known as:  ZETIA Take 1 tablet (10 mg total) by mouth daily.   fentaNYL 75 MCG/HR Commonly known as:  DURAGESIC - dosed mcg/hr Place 1 patch onto the skin every other day.   Fish Oil 1000 MG Caps Take 3 capsules (3,000 mg total) by mouth daily.   furosemide 20 MG tablet Commonly known as:  LASIX TAKE 1 TABLET BY MOUTH DAILY   glucose blood test strip USE TO TEST AS DIRECTED   hydrALAZINE 25 MG tablet Commonly known as:  APRESOLINE TAKE 1 TABLET(25 MG) BY MOUTH THREE TIMES DAILY (FOR IC)   lidocaine 5 % Commonly known as:  LIDODERM Place 2 patches onto the skin at bedtime as needed (hip pain).   losartan 100 MG tablet Commonly known as:  COZAAR Take 1 tablet (100 mg total) by mouth daily.   metoprolol succinate 100 MG 24 hr tablet Commonly known as:  TOPROL-XL TAKE 1 TABLET BY MOUTH DAILY, TAKE WITH OR IMMEDIATELY FOLLOWING A MEAL   mirabegron ER 50 MG Tb24 tablet Commonly known as:  MYRBETRIQ Take 50 mg by mouth at bedtime.   mirtazapine 15 MG tablet Commonly known as:  REMERON Take 1 tablet by mouth daily.   nystatin cream Commonly known as:   MYCOSTATIN Apply 1 application topically 2 (two) times daily.   ONETOUCH DELICA LANCETS 50K Misc TEST BLOOD SUGAR 1 TO 2 TIMES DAILY   Oxycodone HCl 10 MG Tabs Take 10 mg by mouth every 8 (eight) hours as needed.   PENNSAID 2 % Soln Generic drug:  Diclofenac Sodium Place 1 application onto the skin at bedtime as needed (knee pain).   pioglitazone 15 MG tablet Commonly known as:  ACTOS TAKE 1 TABLET(15 MG) BY MOUTH DAILY   potassium gluconate 595 (99 K) MG Tabs tablet Take 595 mg by mouth daily as needed (leg cramps).   prazosin 2 MG capsule Commonly known as:  MINIPRESS Take 1 capsule by mouth daily.   promethazine 25 MG tablet Commonly known as:  PHENERGAN Take 25 mg by mouth daily as needed for nausea or vomiting.   REXULTI 2 MG  Tabs Generic drug:  Brexpiprazole Take 1 tablet by mouth daily.   rOPINIRole 4 MG tablet Commonly known as:  REQUIP TAKE 1 TABLET BY MOUTH AT BEDTIME   tiZANidine 4 MG tablet Commonly known as:  ZANAFLEX Take 1 tablet (4 mg total) by mouth at bedtime.   topiramate 50 MG tablet Commonly known as:  TOPAMAX TAKE 1 TABLET BY MOUTH TWICE A DAY AS NEEDED

## 2018-07-03 NOTE — Progress Notes (Signed)
   Subjective:    Patient ID: [name], female    DOB: 1956-02-11, 62 y.o.   MRN: [id number]  HPI    Review of Systems  Constitutional: Negative.   HENT: Negative.   Eyes: Negative.   Respiratory: Negative.   Cardiovascular: Negative.   Gastrointestinal: Negative.   Endocrine: Negative.   Genitourinary: Negative.   Musculoskeletal: Positive for joint swelling.  Skin: Negative.   Allergic/Immunologic: Negative.   Neurological: Negative.   Hematological: Negative.   Psychiatric/Behavioral: Negative.        Objective:   Physical Exam        Assessment & Plan:

## 2018-07-04 MED ORDER — DICYCLOMINE HCL 20 MG PO TABS: ORAL_TABLET | ORAL | 5 refills | Status: AC

## 2018-07-12 ENCOUNTER — Other Ambulatory Visit: Payer: Self-pay | Admitting: Family

## 2018-07-22 ENCOUNTER — Ambulatory Visit (INDEPENDENT_AMBULATORY_CARE_PROVIDER_SITE_OTHER): Payer: Medicare Other | Admitting: Family Medicine

## 2018-07-22 ENCOUNTER — Encounter: Payer: Self-pay | Admitting: Family Medicine

## 2018-07-22 ENCOUNTER — Ambulatory Visit (HOSPITAL_BASED_OUTPATIENT_CLINIC_OR_DEPARTMENT_OTHER)
Admission: RE | Admit: 2018-07-22 | Discharge: 2018-07-22 | Disposition: A | Discharge status: Home / Self Care | Payer: Medicare Other | Source: Ambulatory Visit | Attending: Family | Admitting: Family

## 2018-07-22 ENCOUNTER — Other Ambulatory Visit: Payer: Self-pay | Admitting: Family

## 2018-07-22 VITALS — BP 125/83 | HR 98 | Ht 65.0 in | Wt 263.0 lb

## 2018-07-22 DIAGNOSIS — Z1231 Encounter for screening mammogram for malignant neoplasm of breast: Secondary | ICD-10-CM | POA: Diagnosis present

## 2018-07-22 DIAGNOSIS — Z1239 Encounter for other screening for malignant neoplasm of breast: Secondary | ICD-10-CM

## 2018-07-22 DIAGNOSIS — Z780 Asymptomatic menopausal state: Secondary | ICD-10-CM | POA: Insufficient documentation

## 2018-07-22 DIAGNOSIS — M810 Age-related osteoporosis without current pathological fracture: Secondary | ICD-10-CM | POA: Insufficient documentation

## 2018-07-22 DIAGNOSIS — M7662 Achilles tendinitis, left leg: Secondary | ICD-10-CM | POA: Diagnosis not present

## 2018-07-22 NOTE — Progress Notes (Signed)
PCP: [name], NP  Subjective:   HPI: Patient is a 62 y.o. female here for left heel pain.  7/22- Patient reports about 1 week ago she started to get posterior left heel pain. No acute injury or trauma. Pain up to 7-8/10 and sharp. Worse with walking, very achy. Can hardly move with this. Had some increase in activity prior to this. No skin changes, numbness.  8/5: Patient presents for follow-up for left Achilles tendinitis.  She reports 60% improvement since her initial visit 2 weeks ago.  She reports 4/10 pain at times with ambulation.  Time she is wearing her cam walker for ambulation.  This CAM Walker also has a heel lift.  She denies any new pain, swelling, erythema, or bruising.  She denies any numbness or tingling.  9/3: Patient reports she's doing very well. Pain is 0/10 now. Gets occasional soreness posterior heel. Wearing heel lifts. No new injuries. No skin changes.  Past Medical History:  Diagnosis Date  . Anxiety   . Arthritis    knees  . Chronic fatigue syndrome   . Chronic pain syndrome    pain clinic w/ [facility]  . Dyslipidemia   . Fibromyalgia   . GERD (gastroesophageal reflux disease)   . History of transient ischemic attack (TIA) 08/2012   per pt no residual  . Hypertension   . IBS (irritable bowel syndrome)   . IC (interstitial cystitis) urologist--- dr Alona Bene [facility]  . Major depression   . Migraine   . Mild obstructive sleep apnea    per study 2005  mild osa, last used cpap 2016,  per pt needs another sleep study   . Mixed incontinence urge and stress   . Multiple thyroid nodules    bilateral --- hx benign bx 2010  . OSA (obstructive sleep apnea)    Has pending sleep study consult for April  . Osteopenia   . PONV (postoperative nausea and vomiting)   . Restless leg syndrome   . Soft tissue mass    right forearm and right thigh  . Type 2 diabetes mellitus ([place])    last A1c 7.8 on 07-15-2017 in epic  . Wears glasses      Current Outpatient Medications on File Prior to Visit  Medication Sig Dispense Refill  . ALPRAZolam (XANAX) 1 MG tablet Take 1 tablet (1 mg total) by mouth 3 (three) times daily as needed for anxiety. (Patient taking differently: Take 1 mg by mouth 3 (three) times daily as needed for anxiety. ) 30 tablet 0  . amLODipine (NORVASC) 5 MG tablet Take 1 tablet (5 mg total) by mouth daily. (Patient taking differently: Take 5 mg by mouth every evening. ) 90 tablet 3  . aspirin EC 81 MG tablet Take 81 mg by mouth daily.    . BELSOMRA 15 MG TABS TAKE 1 TABLET BY MOUTH EVERY EVENING FOR INSOMNIA  1  . bismuth subsalicylate (PEPTO-BISMOL) 262 MG/15ML suspension Take 30 mLs by mouth every 6 (six) hours as needed. 360 mL 0  . busPIRone (BUSPAR) 15 MG tablet Take 15 mg by mouth 2 (two) times daily.  1  . canagliflozin (INVOKANA) 300 MG TABS tablet TAKE 1 TABLET(300 MG) BY MOUTH DAILY BEFORE BREAKFAST 90 tablet 3  . desvenlafaxine (PRISTIQ) 100 MG 24 hr tablet TK 1 T PO D--- takes in am  0  . Diclofenac Sodium (PENNSAID) 2 % SOLN Place 1 application onto the skin at bedtime as needed (knee pain).    Marland Kitchen  dicyclomine (BENTYL) 20 MG tablet TAKE 1 TABLET(20 MG) BY MOUTH EVERY 6 HOURS as needed 30 tablet 5  . Dulaglutide (TRULICITY) 1.5 NW/2.9FA SOPN INJECT INTO SKIN WEEKLY 12 pen 3  . esomeprazole (NEXIUM) 40 MG capsule TAKE ONE CAPSULE BY MOUTH EVERY DAY AT NOON 90 capsule 1  . ezetimibe (ZETIA) 10 MG tablet Take 1 tablet (10 mg total) by mouth daily. 90 tablet 3  . fentaNYL (DURAGESIC - DOSED MCG/HR) 75 MCG/HR Place 1 patch onto the skin every other day.  0  . furosemide (LASIX) 20 MG tablet TAKE 1 TABLET BY MOUTH DAILY (Patient taking differently: TAKE 1 TABLET BY MOUTH DAILY---  takes prn for fluid retention) 30 tablet 3  . glucose blood (ONE TOUCH ULTRA TEST) test strip USE TO TEST AS DIRECTED 100 each 1  . hydrALAZINE (APRESOLINE) 25 MG tablet TAKE 1 TABLET(25 MG) BY MOUTH THREE TIMES DAILY (FOR IC) 270  tablet 1  . lidocaine (LIDODERM) 5 % Place 2 patches onto the skin at bedtime as needed (hip pain).     Marland Kitchen losartan (COZAAR) 100 MG tablet TAKE 1 TABLET BY MOUTH EVERY DAY 90 tablet 1  . metoprolol succinate (TOPROL-XL) 100 MG 24 hr tablet TAKE 1 TABLET BY MOUTH DAILY, TAKE WITH OR IMMEDIATELY FOLLOWING A MEAL 90 tablet 1  . mirabegron ER (MYRBETRIQ) 50 MG TB24 Take 50 mg by mouth at bedtime.     . mirtazapine (REMERON) 15 MG tablet Take 1 tablet by mouth daily.    Marland Kitchen nystatin cream (MYCOSTATIN) Apply 1 application topically 2 (two) times daily. 30 g 2  . Omega-3 Fatty Acids (FISH OIL) 1000 MG CAPS Take 3 capsules (3,000 mg total) by mouth daily. 300 capsule 11  . ONETOUCH DELICA LANCETS 21H MISC TEST BLOOD SUGAR 1 TO 2 TIMES DAILY 100 each 2  . Oxycodone HCl 10 MG TABS Take 10 mg by mouth every 8 (eight) hours as needed.   0  . pioglitazone (ACTOS) 15 MG tablet TAKE 1 TABLET(15 MG) BY MOUTH DAILY 90 tablet 3  . potassium gluconate 595 MG TABS tablet Take 595 mg by mouth daily as needed (leg cramps).    . prazosin (MINIPRESS) 2 MG capsule Take 1 capsule by mouth daily.    . promethazine (PHENERGAN) 25 MG tablet Take 25 mg by mouth daily as needed for nausea or vomiting.     Marland Kitchen REXULTI 2 MG TABS Take 1 tablet by mouth daily.  1  . rOPINIRole (REQUIP) 4 MG tablet TAKE 1 TABLET BY MOUTH AT BEDTIME 90 tablet 1  . tiZANidine (ZANAFLEX) 4 MG tablet Take 1 tablet (4 mg total) by mouth at bedtime. 30 tablet 0  . topiramate (TOPAMAX) 50 MG tablet TAKE 1 TABLET BY MOUTH TWICE A DAY AS NEEDED 180 tablet 1  . [DISCONTINUED] pravastatin (PRAVACHOL) 20 MG tablet Take 1 tablet (20 mg total) by mouth daily. 30 tablet 3   No current facility-administered medications on file prior to visit.     Past Surgical History:  Procedure Laterality Date  . ABDOMINAL EXPLORATION SURGERY  1983   MECKEL diverticulum removed and APPENDECTOMY and hemicolectomy  . ANTERIOR CERVICAL DECOMP/DISCECTOMY FUSION  01-04-2009   dr  pool  [facility]  . BREAST CYST EXCISION Left 1978   benign  . CARDIOVASCULAR STRESS TEST  03/17/2013   normal nuclear study w/ no ischemia/  normal LV function and wall motion , ef 62%  . COLONOSCOPY  last one 08-14-2011  . DIAGNOSTIC LAPAROSCOPY  01-18-2004  dr Radene Knee; 1996; 1995  . ESOPHAGOGASTRODUODENOSCOPY  last one 09-03-2013   dr Candie Echevaria  . EXCISION LIPOMA LEFT THIGH  04-03-2004  dr Margot Chimes  . EXPLORATORY LAPAROTOMY W/ RIGHT OVARIAN CYSTECTOMY AND LYSIS ADHESIONS  1982  . LAPAROSCOPIC ASSISTED VAGINAL HYSTERECTOMY  1992  . LIPOMA EXCISION  05-16-2001;  1998;  [place]  . MASS EXCISION Right 11/06/2017   Procedure: EXCISE MASSES OF RIGHT FOREARM AND RIGHT THIGH;  Surgeon: Armandina Gemma, MD;  Location: [facility];  Service: General;  Laterality: Right;  . MULTIPLE CYSTO/  HYDRODISTENTION/ INSTILLATION THERAPY  since 2001;  last one 08/ 2018   dr Amalia Hailey  [facility]  . NASAL SINUS SURGERY  2004  . RECTOCELE REPAIR  07/2008  . TONSILLECTOMY AND ADENOIDECTOMY  1970  . TOTAL KNEE ARTHROPLASTY Bilateral left 07-31-2010/  right 08-29-2010   dr Alvan Dame  [facility]  . TRANSTHORACIC ECHOCARDIOGRAM  08/23/2012   normal  w/ ef 60-65%  . VENTRAL HERNIA REPAIR  06-05-2006 dr Excell Seltzer [facility];  2001; 1992    Allergies  Allergen Reactions  . Nsaids Nausea And Vomiting and Swelling    Worsening symptoms- interstitial cystitist  . Atorvastatin     Body aches, worsened interstitial cystitis. Also simvastatin  . Buprenorphine Hcl Nausea And Vomiting  . Chlorzoxazone Other (See Comments) and Nausea Only    Unknown reaction  . Darvon [Propoxyphene] Other (See Comments)    hallucinations  . Gabapentin Nausea And Vomiting  . Gemfibrozil Nausea And Vomiting  . Metronidazole Nausea And Vomiting  . Morphine And Related Nausea And Vomiting  . Nitrofurantoin Nausea And Vomiting  . Olmesartan Medoxomil-Hctz Nausea And Vomiting  . Ondansetron Nausea And Vomiting  . Pentosan Polysulfate Sodium Other (See  Comments)    Unknown reaction  . Pravastatin Other (See Comments)    Body aches, worsening interstitial cystitis.  . Ramipril Cough  . Sulfa Antibiotics Nausea And Vomiting  . Elmiron [Pentosan Polysulfate] Nausea And Vomiting and Rash    Social History   Socioeconomic History  . Marital status: Divorced    Spouse name: Not on file  . Number of children: 2  . Years of education: Not on file  . Highest education level: Not on file  Occupational History  . Occupation: disabled  Social Needs  . Financial resource strain: Not on file  . Food insecurity:    Worry: Not on file    Inability: Not on file  . Transportation needs:    Medical: Not on file    Non-medical: Not on file  Tobacco Use  . Smoking status: Former Smoker    Packs/day: 0.10    Years: 1.00    Pack years: 0.10    Types: Cigarettes    Last attempt to quit: 10/19/2012    Years since quitting: 5.7  . Smokeless tobacco: Never Used  Substance and Sexual Activity  . Alcohol use: No    Frequency: Never    Comment: rare - 4-5 drinks a year  . Drug use: No  . Sexual activity: Never  Lifestyle  . Physical activity:    Days per week: Not on file    Minutes per session: Not on file  . Stress: Not on file  Relationships  . Social connections:    Talks on phone: Not on file    Gets together: Not on file    Attends religious service: Not on file    Active member of club or organization: Not on  file    Attends meetings of clubs or organizations: Not on file    Relationship status: Not on file  . Intimate partner violence:    Fear of current or ex partner: Not on file    Emotionally abused: Not on file    Physically abused: Not on file    Forced sexual activity: Not on file  Other Topics Concern  . Not on file  Social History Narrative   Disabled   Divorced, lives alone with a cat. -    Children: 1 dtr, 1 son, 4 grandchildren      She takes fentanyl and oxycodone for pain. Does not exercise because of being  disabled.    Family History  Problem Relation Age of Onset  . Breast cancer Mother   . Dementia Mother   . Diabetes Father   . Dementia Father   . Alzheimer's disease Father   . Breast cancer Maternal Grandmother   . Throat cancer Maternal Grandmother   . Esophageal cancer Maternal Grandmother   . Breast cancer Other        maternal great aunts x 5  . Rheum arthritis Other   . Diabetes Maternal Grandfather   . Colon cancer Neg Hx     BP 125/83   Pulse 98   Ht 5\' 5"  (1.651 m)   Wt 263 lb (119.3 kg)   BMI 43.77 kg/m   Review of Systems: See HPI above.     Objective:  Physical Exam:  Gen: NAD, comfortable in exam room  Left foot/ankle: Haglund's deformity present.  No other gross deformity, swelling, ecchymoses FROM with 5/5 strength all directions TTP minimally at insertion of achilles on calcaneus medially.  No other tenderness. Negative ant drawer and talar tilt.   Negative calcaneal squeeze. Thompsons test negative. NV intact distally.  Assessment & Plan:  1.  Left Achilles tendinitis, insertional - much improved with heel lifts.  She has been doing home exercises but verbalized she could be doing them more frequently.  Tylenol, icing only if needed.  Heel lifts for 4-6 more weeks.  Home exercises also for 4-6 more weeks, advance to 2 legged calf raises if tolerated.  F/u prn otherwise.

## 2018-07-22 NOTE — Patient Instructions (Signed)
You're doing great! Use the heel lifts for 4-6 more weeks. Do the exercises with theraband 3 times a week for 4-6 more weeks too. Advance to 2 legged calf raises that I showed you when you can. Icing, tylenol only if needed. Follow up with me as needed.

## 2018-07-23 ENCOUNTER — Telehealth: Payer: Self-pay | Admitting: *Deleted

## 2018-07-23 ENCOUNTER — Telehealth: Payer: Self-pay | Admitting: Family

## 2018-07-23 DIAGNOSIS — M810 Age-related osteoporosis without current pathological fracture: Secondary | ICD-10-CM

## 2018-07-23 MED ORDER — CALCIUM CARBONATE-VITAMIN D 600-400 MG-UNIT PO TABS: 1.0000 | ORAL_TABLET | Freq: Two times a day (BID) | ORAL | Status: AC

## 2018-07-23 MED ORDER — ALENDRONATE SODIUM 70 MG PO TABS: 70.0000 mg | ORAL_TABLET | ORAL | 11 refills | Status: AC

## 2018-07-23 NOTE — Telephone Encounter (Signed)
Please let pt know that her bone density shows osteoporosis. I would recommend that she start fosamax to help with her bone health. Also add caltrate 600mg  + D bid. Regular weight bearing exercise.  Needs vit D level at her convenience.

## 2018-07-23 NOTE — Telephone Encounter (Signed)
Left message for pt to return my call. Ok for PEC / triage to discuss with pt.  

## 2018-07-23 NOTE — Telephone Encounter (Signed)
Result note from M. Inda Castle read to patient; verbalizes understanding. Will CB to schedule lab, Vit D. Pt would like copy of scan mailed to her.  Telephone encounter as result note was not routed to [facility].

## 2018-07-25 NOTE — Telephone Encounter (Signed)
Notified pt and she voices understanding. She is travelling out of town next week and scheduled lab appt for 08/07/18 at 2pm.

## 2018-07-29 ENCOUNTER — Other Ambulatory Visit: Payer: Self-pay | Admitting: Internal Medicine

## 2018-08-05 DIAGNOSIS — G4733 Obstructive sleep apnea (adult) (pediatric): Secondary | ICD-10-CM | POA: Diagnosis not present

## 2018-08-06 DIAGNOSIS — G4733 Obstructive sleep apnea (adult) (pediatric): Secondary | ICD-10-CM

## 2018-08-07 ENCOUNTER — Other Ambulatory Visit: Payer: Medicare Other

## 2018-08-07 ENCOUNTER — Telehealth: Payer: Self-pay | Admitting: Pulmonary Disease

## 2018-08-07 NOTE — Telephone Encounter (Signed)
HST 08/05/18 >> AHI 7.3, SpO2 low 86%   Please inform her that her sleep study shows mild obstructive sleep apnea.  Please arrange for ROV with me or NP to discuss treatment options.

## 2018-08-08 ENCOUNTER — Other Ambulatory Visit: Payer: Self-pay | Admitting: *Deleted

## 2018-08-08 DIAGNOSIS — G4733 Obstructive sleep apnea (adult) (pediatric): Secondary | ICD-10-CM

## 2018-08-08 NOTE — Telephone Encounter (Signed)
ATC pt, no answer. Left message for pt to call back.  

## 2018-08-08 NOTE — Telephone Encounter (Signed)
Patient is aware of these results and has been scheduled for appt with Derl Barrow.

## 2018-08-12 ENCOUNTER — Encounter: Payer: Self-pay | Admitting: Primary Care

## 2018-08-12 ENCOUNTER — Ambulatory Visit (INDEPENDENT_AMBULATORY_CARE_PROVIDER_SITE_OTHER): Payer: Medicare Other | Admitting: Primary Care

## 2018-08-12 VITALS — BP 140/80 | HR 70 | Ht 65.5 in | Wt 253.0 lb

## 2018-08-12 DIAGNOSIS — G4733 Obstructive sleep apnea (adult) (pediatric): Secondary | ICD-10-CM | POA: Diagnosis not present

## 2018-08-12 DIAGNOSIS — I10 Essential (primary) hypertension: Secondary | ICD-10-CM | POA: Diagnosis not present

## 2018-08-12 NOTE — Patient Instructions (Addendum)
Referral to DME([facility]) for new CPAP start, auto titrate 5-15cm H20. Mask of choice, humidification and supplies  FU in 31-90 days with Dr. Halford Chessman or NP

## 2018-08-12 NOTE — Assessment & Plan Note (Addendum)
-   Stable; BP 140/80 - Continues Losartan, Norvasc and Toprol

## 2018-08-12 NOTE — Progress Notes (Signed)
@Patient  ID: [name], female    DOB: Mar 21, 1956, 62 y.o.   MRN: [id number]  Chief Complaint  Patient presents with  . Follow-up    discuss HST    Referring provider: [name], NP  HPI: 62 year old female, former smoker. PMH OSA and allergic rhinitis. Patient was found to have mild sleep apnea in 2005, used CPAP until 2014. Patient of Dr. Halford Chessman, last seen 06/2018. Ordered for home sleep test.   08/12/2018 Patient presents today for home sleep test follow-up. Repeat HST 08/05/18 >> AHI 7.3, SpO2 low 86%. Snores a little. Feels tired during the day. She has previously worn cpap nasal mask in the past, reported benefit with use but had mask fit issues and nasal sores. She is agreeing to re-start cpap.    Allergies  Allergen Reactions  . Nsaids Nausea And Vomiting and Swelling    Worsening symptoms- interstitial cystitist  . Atorvastatin     Body aches, worsened interstitial cystitis. Also simvastatin  . Buprenorphine Hcl Nausea And Vomiting  . Chlorzoxazone Other (See Comments) and Nausea Only    Unknown reaction  . Darvon [Propoxyphene] Other (See Comments)    hallucinations  . Gabapentin Nausea And Vomiting  . Gemfibrozil Nausea And Vomiting  . Metronidazole Nausea And Vomiting  . Morphine And Related Nausea And Vomiting  . Nitrofurantoin Nausea And Vomiting  . Olmesartan Medoxomil-Hctz Nausea And Vomiting  . Ondansetron Nausea And Vomiting  . Pentosan Polysulfate Sodium Other (See Comments)    Unknown reaction  . Pravastatin Other (See Comments)    Body aches, worsening interstitial cystitis.  . Ramipril Cough  . Sulfa Antibiotics Nausea And Vomiting  . Elmiron [Pentosan Polysulfate] Nausea And Vomiting and Rash    Immunization History  Administered Date(s) Administered  . Influenza Split 08/03/2011, 07/29/2012  . Influenza Whole 08/19/2009  . Influenza,inj,Quad PF,6+ Mos 07/31/2013, 08/02/2014, 08/14/2016, 08/16/2017, 07/03/2018  .  Influenza-Unspecified 08/30/2015  . Pneumococcal Polysaccharide-23 09/24/2012, 10/25/2017  . Tdap 09/24/2012  . Zoster 05/11/2015  . Zoster Recombinat (Shingrix) 02/18/2017, 04/29/2017    Past Medical History:  Diagnosis Date  . Anxiety   . Arthritis    knees  . Chronic fatigue syndrome   . Chronic pain syndrome    pain clinic w/ [facility]  . Dyslipidemia   . Fibromyalgia   . GERD (gastroesophageal reflux disease)   . History of transient ischemic attack (TIA) 08/2012   per pt no residual  . Hypertension   . IBS (irritable bowel syndrome)   . IC (interstitial cystitis) urologist--- dr Alona Bene [facility]  . Major depression   . Migraine   . Mild obstructive sleep apnea    per study 2005  mild osa, last used cpap 2016,  per pt needs another sleep study   . Mixed incontinence urge and stress   . Multiple thyroid nodules    bilateral --- hx benign bx 2010  . OSA (obstructive sleep apnea)    Has pending sleep study consult for April  . Osteopenia   . PONV (postoperative nausea and vomiting)   . Restless leg syndrome   . Soft tissue mass    right forearm and right thigh  . Type 2 diabetes mellitus ([place])    last A1c 7.8 on 07-15-2017 in epic  . Wears glasses     Tobacco History: Social History   Tobacco Use  Smoking Status Former Smoker  . Packs/day: 0.10  . Years: 1.00  . Pack years: 0.10  .  Types: Cigarettes  . Last attempt to quit: 10/19/2012  . Years since quitting: 5.8  Smokeless Tobacco Never Used   Counseling given: Not Answered   Outpatient Medications Prior to Visit  Medication Sig Dispense Refill  . ALPRAZolam (XANAX) 1 MG tablet Take 1 tablet (1 mg total) by mouth 3 (three) times daily as needed for anxiety. (Patient taking differently: Take 1 mg by mouth 3 (three) times daily as needed for anxiety. ) 30 tablet 0  . amLODipine (NORVASC) 5 MG tablet Take 1 tablet (5 mg total) by mouth daily. (Patient taking differently: Take 5 mg by mouth every evening. ) 90  tablet 3  . aspirin EC 81 MG tablet Take 81 mg by mouth daily.    . BELSOMRA 15 MG TABS TAKE 1 TABLET BY MOUTH EVERY EVENING FOR INSOMNIA  1  . busPIRone (BUSPAR) 15 MG tablet Take 15 mg by mouth 2 (two) times daily.  1  . Calcium Carbonate-Vitamin D 600-400 MG-UNIT tablet Take 1 tablet by mouth 2 (two) times daily.    . canagliflozin (INVOKANA) 300 MG TABS tablet TAKE 1 TABLET(300 MG) BY MOUTH DAILY BEFORE BREAKFAST 90 tablet 3  . desvenlafaxine (PRISTIQ) 100 MG 24 hr tablet TK 1 T PO D--- takes in am  0  . dicyclomine (BENTYL) 20 MG tablet TAKE 1 TABLET(20 MG) BY MOUTH EVERY 6 HOURS as needed 30 tablet 5  . Dulaglutide (TRULICITY) 1.5 LP/3.7TK SOPN INJECT INTO SKIN WEEKLY 12 pen 3  . esomeprazole (NEXIUM) 40 MG capsule TAKE ONE CAPSULE BY MOUTH EVERY DAY AT NOON 90 capsule 1  . fentaNYL (DURAGESIC - DOSED MCG/HR) 75 MCG/HR Place 1 patch onto the skin every other day.  0  . glucose blood (ONE TOUCH ULTRA TEST) test strip USE TO TEST AS DIRECTED 100 each 1  . hydrALAZINE (APRESOLINE) 25 MG tablet TAKE 1 TABLET(25 MG) BY MOUTH THREE TIMES DAILY (FOR IC) 270 tablet 1  . losartan (COZAAR) 100 MG tablet TAKE 1 TABLET BY MOUTH EVERY DAY 90 tablet 1  . metoprolol succinate (TOPROL-XL) 100 MG 24 hr tablet TAKE 1 TABLET BY MOUTH DAILY, TAKE WITH OR IMMEDIATELY FOLLOWING A MEAL 90 tablet 1  . mirabegron ER (MYRBETRIQ) 50 MG TB24 Take 50 mg by mouth at bedtime.     . mirtazapine (REMERON) 15 MG tablet Take 1 tablet by mouth daily.    . Omega-3 Fatty Acids (FISH OIL) 1000 MG CAPS Take 3 capsules (3,000 mg total) by mouth daily. 300 capsule 11  . ONETOUCH DELICA LANCETS 24O MISC TEST BLOOD SUGAR 1 TO 2 TIMES DAILY 100 each 2  . Oxycodone HCl 10 MG TABS Take 10 mg by mouth every 8 (eight) hours as needed.   0  . pioglitazone (ACTOS) 15 MG tablet TAKE 1 TABLET(15 MG) BY MOUTH DAILY 90 tablet 3  . prazosin (MINIPRESS) 2 MG capsule Take 1 capsule by mouth daily.    Marland Kitchen REXULTI 2 MG TABS Take 1 tablet by mouth  daily.  1  . rOPINIRole (REQUIP) 4 MG tablet TAKE 1 TABLET BY MOUTH AT BEDTIME 90 tablet 1  . tiZANidine (ZANAFLEX) 4 MG tablet Take 1 tablet (4 mg total) by mouth at bedtime. 30 tablet 0  . alendronate (FOSAMAX) 70 MG tablet Take 1 tablet (70 mg total) by mouth every 7 (seven) days. Take with a full glass of water on an empty stomach. (Patient not taking: Reported on 08/12/2018) 4 tablet 11  . bismuth subsalicylate (PEPTO-BISMOL) 262 MG/15ML  suspension Take 30 mLs by mouth every 6 (six) hours as needed. (Patient not taking: Reported on 08/12/2018) 360 mL 0  . furosemide (LASIX) 20 MG tablet TAKE 1 TABLET BY MOUTH DAILY (Patient not taking: Reported on 08/12/2018) 30 tablet 3  . lidocaine (LIDODERM) 5 % Place 2 patches onto the skin at bedtime as needed (hip pain).     . nystatin cream (MYCOSTATIN) Apply 1 application topically 2 (two) times daily. (Patient not taking: Reported on 08/12/2018) 30 g 2  . potassium gluconate 595 MG TABS tablet Take 595 mg by mouth daily as needed (leg cramps).    . promethazine (PHENERGAN) 25 MG tablet Take 25 mg by mouth daily as needed for nausea or vomiting.     . topiramate (TOPAMAX) 50 MG tablet TAKE 1 TABLET BY MOUTH TWICE A DAY AS NEEDED (Patient not taking: Reported on 08/12/2018) 180 tablet 1  . Diclofenac Sodium (PENNSAID) 2 % SOLN Place 1 application onto the skin at bedtime as needed (knee pain).    Marland Kitchen ezetimibe (ZETIA) 10 MG tablet Take 1 tablet (10 mg total) by mouth daily. 90 tablet 3   No facility-administered medications prior to visit.     Review of Systems  Review of Systems  Constitutional: Positive for fatigue.  HENT: Negative.   Cardiovascular: Negative.     Physical Exam  BP 140/80   Pulse 70   Ht 5' 5.5" (1.664 m)   Wt 253 lb (114.8 kg)   SpO2 94%   BMI 41.46 kg/m  Physical Exam  Constitutional: She is oriented to person, place, and time. She appears well-developed and well-nourished.  HENT:  Head: Normocephalic and atraumatic.    Mallampati class II  Eyes: Pupils are equal, round, and reactive to light. EOM are normal.  Neck: Normal range of motion. Neck supple.  Cardiovascular: Normal rate, regular rhythm and normal heart sounds.  No murmur heard. Pulmonary/Chest: Effort normal and breath sounds normal. No respiratory distress. She has no wheezes.  Abdominal: Soft. Bowel sounds are normal. There is no tenderness.  Neurological: She is alert and oriented to person, place, and time.  Skin: Skin is warm and dry. No rash noted. No erythema.  Psychiatric: She has a normal mood and affect. Her behavior is normal. Judgment normal.     Lab Results:  CBC    Component Value Date/Time   WBC 5.6 06/10/2017 1330   RBC 4.84 06/10/2017 1330   HGB 12.6 11/06/2017 1427   HCT 37.0 11/06/2017 1427   PLT 183.0 06/10/2017 1330   MCV 83.0 06/10/2017 1330   MCH 28.2 08/09/2014 0850   MCHC 32.0 06/10/2017 1330   RDW 16.6 (H) 06/10/2017 1330   LYMPHSABS 1.7 06/10/2017 1330   MONOABS 0.3 06/10/2017 1330   EOSABS 0.1 06/10/2017 1330   BASOSABS 0.0 06/10/2017 1330    BMET    Component Value Date/Time   NA 136 06/05/2018 1120   NA 137 02/03/2018 1123   K 4.2 06/05/2018 1120   CL 100 06/05/2018 1120   CO2 32 06/05/2018 1120   GLUCOSE 137 (H) 06/05/2018 1120   BUN 16 06/05/2018 1120   BUN 12 02/03/2018 1123   CREATININE 0.85 06/05/2018 1120   CREATININE 0.93 10/25/2017 1432   CALCIUM 9.3 06/05/2018 1120   GFRNONAA 83 02/03/2018 1123   GFRNONAA 79 06/08/2014 1034   GFRAA 96 02/03/2018 1123   GFRAA >89 06/08/2014 1034    BNP No results found for: BNP  ProBNP No results found  for: PROBNP  Imaging: Dg Bone Density  Result Date: 07/22/2018 EXAM: DUAL X-RAY ABSORPTIOMETRY (DXA) FOR BONE MINERAL DENSITY IMPRESSION: [name] Your patient [name] completed a BMD test on 07/22/2018 using the Nadine (analysis version: 16.SP2) manufactured by [employer]. The following summarizes the  results of our evaluation. PATIENT: Name: [name], [name] Patient ID: [id number] Birth Date: Jul 05, 1956 Height: 65.0 in. Gender: Female Measured: 07/22/2018 Weight: 253.4 lbs. Indications: Caucasian, Early Menopause, Estrogen Deficiency, History of Fracture (Adult), Hysterectomy, Low Calcium Intake, Post Menopausal Fractures: Foot, left foot, right foot Treatments: Invokana, Velcade(chemo injection) ASSESSMENT: The BMD measured at Femur Neck Right is 0.622 g/cm2 with a T-score of -3.0. This patient is considered osteoporotic according to [facility]) criteria. Site Region Measured Date Measured Age WHO YA BMD Classification T-score AP Spine L1-L4 07/22/2018 62.0 Normal -0.9 1.066 g/cm2 AP Spine L1-L4 06/13/2015 57.9 Normal -0.8 1.080 g/cm2 DualFemur Neck Right 07/22/2018 61.0 Osteoporosis -3.0 0.622 g/cm2 DualFemur Neck Right 06/13/2015 57.9 Osteopenia -2.1 0.750 g/cm2 DualFemur Total Mean 07/22/2018 61.0 Osteopenia -2.0 0.750 g/cm2 DualFemur Total Mean 06/13/2015 57.9 Osteopenia -1.4 0.835 g/cm2 World Health Organization [facility]) criteria for post-menopausal, Caucasian Women: Normal       T-score at or above -1 SD Osteopenia   T-score between -1 and -2.5 SD Osteoporosis T-score at or below -2.5 SD RECOMMENDATION: FOLLOW-UP: Patients with diagnosis of osteoporosis or at high risk for fracture should have regular bone mineral density tests. For patients eligible for Medicare, routine testing is allowed once every 2 years. The testing frequency can be increased to one year for patients who have rapidly progressing disease, those who are receiving or discontinuing medical therapy to restore bone mass, or have additional risk factors. I have reviewed this report, anf agree with the above findings. [facility] Electronically Signed   By: Dorise Bullion III M.D   On: 07/22/2018 15:16   Mm 3d Screen Breast Bilateral  Result Date: 07/22/2018 CLINICAL DATA:  Screening. EXAM: DIGITAL SCREENING  BILATERAL MAMMOGRAM WITH TOMO AND CAD COMPARISON:  Previous exam(s). ACR Breast Density Category b: There are scattered areas of fibroglandular density. FINDINGS: There are no findings suspicious for malignancy. Images were processed with CAD. IMPRESSION: No mammographic evidence of malignancy. A result letter of this screening mammogram will be mailed directly to the patient. RECOMMENDATION: Screening mammogram in one year. (Code:[id number]-[id number]) BI-RADS CATEGORY  1: Negative. Electronically Signed   By: Abelardo Diesel M.D.   On: 07/22/2018 15:00     Assessment & Plan:   Essential hypertension - Stable; BP 140/80 - Continues Losartan, Norvasc and Toprol  OSA (obstructive sleep apnea) - Repeat HST 08/05/18 showing mild sleep apnea (AHI 7.3, SpO2 low 86%) - Discussed available options including cpap, weight loss or oral appliance. CPAP is the gold standard for treatment - Patient agreeing to restart cpap therapy  - CPAP auto titrate 5-15cm H20, mask of choice with humidification - Instructed to aim for 100% compliance, goal 4-6 hr or more each night  - FU in 1-3 months with Dr. Halford Chessman or NP      Martyn Ehrich, NP 08/12/2018

## 2018-08-12 NOTE — Assessment & Plan Note (Addendum)
-   Repeat HST 08/05/18 showing mild sleep apnea (AHI 7.3, SpO2 low 86%) - Discussed available options including cpap, weight loss or oral appliance. CPAP is the gold standard for treatment - Patient agreeing to restart cpap therapy  - CPAP auto titrate 5-15cm H20, mask of choice with humidification - Instructed to aim for 100% compliance, goal 4-6 hr or more each night  - FU in 1-3 months with Dr. Halford Chessman or NP

## 2018-08-14 ENCOUNTER — Other Ambulatory Visit (INDEPENDENT_AMBULATORY_CARE_PROVIDER_SITE_OTHER): Payer: Medicare Other

## 2018-08-14 DIAGNOSIS — M810 Age-related osteoporosis without current pathological fracture: Secondary | ICD-10-CM

## 2018-08-17 LAB — VITAMIN D 1,25 DIHYDROXY
VITAMIN D3 1, 25 (OH): 23 pg/mL
Vitamin D 1, 25 (OH)2 Total: 23 pg/mL (ref 18–72)

## 2018-08-18 ENCOUNTER — Telehealth: Payer: Self-pay | Admitting: Internal Medicine

## 2018-08-18 ENCOUNTER — Other Ambulatory Visit: Payer: Self-pay

## 2018-08-18 ENCOUNTER — Telehealth: Payer: Self-pay | Admitting: Family

## 2018-08-18 MED ORDER — VITAMIN D3 75 MCG (3000 UT) PO TABS: 1.0000 | ORAL_TABLET | Freq: Every day | ORAL | Status: AC

## 2018-08-18 MED ORDER — GLUCOSE BLOOD VI STRP: ORAL_STRIP | 1 refills | Status: DC

## 2018-08-18 NOTE — Telephone Encounter (Signed)
RX sent

## 2018-08-18 NOTE — Telephone Encounter (Signed)
Vit d is low normal. I would like her to add otc vit D 3000 iu daily.

## 2018-08-18 NOTE — Telephone Encounter (Signed)
Refill is needed on one touch ultra 50 test strips please called into [facility] # [phone]  Pt did call the pharmacy but they said to call us because they had no refill requests

## 2018-08-19 NOTE — Progress Notes (Signed)
Reviewed and agree with assessment/plan.   Floreen Teegarden, MD [facility] 11/14/2016, 12:24 PM Pager:  [phone]  

## 2018-08-20 ENCOUNTER — Ambulatory Visit (INDEPENDENT_AMBULATORY_CARE_PROVIDER_SITE_OTHER): Payer: Medicare Other | Admitting: Cardiology

## 2018-08-20 ENCOUNTER — Encounter

## 2018-08-20 ENCOUNTER — Encounter: Payer: Self-pay | Admitting: Cardiology

## 2018-08-20 VITALS — BP 120/70 | HR 72 | Ht 65.0 in | Wt 258.8 lb

## 2018-08-20 DIAGNOSIS — I10 Essential (primary) hypertension: Secondary | ICD-10-CM | POA: Diagnosis not present

## 2018-08-20 DIAGNOSIS — E1169 Type 2 diabetes mellitus with other specified complication: Secondary | ICD-10-CM

## 2018-08-20 DIAGNOSIS — E785 Hyperlipidemia, unspecified: Secondary | ICD-10-CM

## 2018-08-20 NOTE — Progress Notes (Signed)
PCP: [name], NP  Clinic Note: Chief Complaint  Patient presents with  . Follow-up  . Hyperlipidemia    HPI: [name] is a 62 y.o. female  With Hyperlipidemia, initially in March 2019 at the request of [name], NP , who presents here for 6 month f/u.  PMH notable for  hypertension, hyperlipidemia, diabetes mellitus, type II and history of TIA.  She also has chronic interstitial cystitis, restless leg and fibromyalgia. She also has pretty significant anxiety (had to keep the door open, like to stand up during the exam as opposed to sitting down) She has been intolerant of statins indicating that they exacerbate her interstitial cystitis so she refuses to take statins (she has tried atorvastatin, simvastatin and pravastatin.. She is also tried gemfibrozil which causes nausea and vomiting.  GABRIELE ZWILLING was seen back in March - plan was to try Zetia & increased Fish Oil with f/u labs in ~3 months with 3 month f/u. The f/y Lipid panel was not done.  Recent Hospitalizations:  n/a  Studies Personally Reviewed - (if available, images/films reviewed: From Epic Chart or Care Everywhere)  n/a  Interval History: [name] presents today as far she knows to discuss her cholesterol.  She seems to be doing relatively well with no major complaints.  Somewhat stoic this morning.  She has not really been exercising much lately, but did do some swimming in the summertime at the swimming pool for exercise.  The only complaint she notes that her lower legs occasionally hurt and feel cramp, but she denies any other cardiac symptoms. She still has issues with her interstitial cystitis and fibromyalgia, but no complaints of chest tightness or pressure with rest or exertion.  No PND orthopnea.  Minimal edema for which she takes Lasix as needed.  Cardiovascular ROS: no chest pain or dyspnea on exertion negative for - chest pain, dyspnea on exertion, irregular heartbeat,  palpitations, rapid heart rate, shortness of breath or Syncope/near syncope, TIA/amaurosis fugax, claudication.   ROS: A comprehensive was performed. Review of Systems  Constitutional: Negative for chills, fever and malaise/fatigue.  HENT: Negative for congestion, nosebleeds, sinus pain and sore throat.   Respiratory: Negative for cough (From postnasal drip) and wheezing.   Cardiovascular: Negative for claudication and leg swelling.  Gastrointestinal: Negative for blood in stool, constipation, diarrhea, heartburn and melena.  Genitourinary: Positive for dysuria and urgency. Negative for hematuria.       From Interstitial Cystitis  Musculoskeletal: Positive for joint pain (Both knees still hurt along with hip pain depending on which knees bothering her.). Negative for falls and myalgias.  Skin: Positive for rash (occasional bilateral LE red rash).  Neurological: Negative for dizziness, focal weakness and loss of consciousness.  Endo/Heme/Allergies: Positive for environmental allergies.  Psychiatric/Behavioral: Positive for depression (By exam). Negative for memory loss. The patient is nervous/anxious and has insomnia (doing better - recent OSA Eval - going to be starting back on CPAP).        Much less anxious  All other systems reviewed and are negative.   I have reviewed and (if needed) personally updated the patient's problem list, medications, allergies, past medical and surgical history, social and family history.   Past Medical History:  Diagnosis Date  . Anxiety   . Arthritis    knees  . Chronic fatigue syndrome   . Chronic pain syndrome    pain clinic w/ [facility]  . Dyslipidemia   . Fibromyalgia   . GERD (gastroesophageal  reflux disease)   . History of transient ischemic attack (TIA) 08/2012   per pt no residual  . Hypertension   . IBS (irritable bowel syndrome)   . IC (interstitial cystitis) urologist--- dr Alona Bene [facility]  . Major depression   . Migraine   . Mild  obstructive sleep apnea    per study 2005  mild osa, last used cpap 2016,  per pt needs another sleep study   . Mixed incontinence urge and stress   . Multiple thyroid nodules    bilateral --- hx benign bx 2010  . OSA (obstructive sleep apnea)    Has pending sleep study consult for April  . Osteopenia   . PONV (postoperative nausea and vomiting)   . Restless leg syndrome   . Soft tissue mass    right forearm and right thigh  . Type 2 diabetes mellitus ([place])    last A1c 7.8 on 07-15-2017 in epic  . Wears glasses     Past Surgical History:  Procedure Laterality Date  . ABDOMINAL EXPLORATION SURGERY  1983   MECKEL diverticulum removed and APPENDECTOMY and hemicolectomy  . ANTERIOR CERVICAL DECOMP/DISCECTOMY FUSION  01-04-2009   dr pool  [facility]  . BREAST CYST EXCISION Left 1978   benign  . CARDIOVASCULAR STRESS TEST  03/17/2013   normal nuclear study w/ no ischemia/  normal LV function and wall motion , ef 62%  . COLONOSCOPY  last one 08-14-2011  . DIAGNOSTIC LAPAROSCOPY  01-18-2004  dr Radene Knee; 1996; 1995  . ESOPHAGOGASTRODUODENOSCOPY  last one 09-03-2013   dr Candie Echevaria  . EXCISION LIPOMA LEFT THIGH  04-03-2004  dr Margot Chimes  . EXPLORATORY LAPAROTOMY W/ RIGHT OVARIAN CYSTECTOMY AND LYSIS ADHESIONS  1982  . LAPAROSCOPIC ASSISTED VAGINAL HYSTERECTOMY  1992  . LIPOMA EXCISION  05-16-2001;  1998;  [place]  . MASS EXCISION Right 11/06/2017   Procedure: EXCISE MASSES OF RIGHT FOREARM AND RIGHT THIGH;  Surgeon: Armandina Gemma, MD;  Location: [facility];  Service: General;  Laterality: Right;  . MULTIPLE CYSTO/  HYDRODISTENTION/ INSTILLATION THERAPY  since 2001;  last one 08/ 2018   dr Amalia Hailey  [facility]  . NASAL SINUS SURGERY  2004  . RECTOCELE REPAIR  07/2008  . TONSILLECTOMY AND ADENOIDECTOMY  1970  . TOTAL KNEE ARTHROPLASTY Bilateral left 07-31-2010/  right 08-29-2010   dr Alvan Dame  [facility]  . TRANSTHORACIC ECHOCARDIOGRAM  08/23/2012   normal  w/ ef 60-65%  . VENTRAL HERNIA REPAIR   06-05-2006 dr Excell Seltzer [facility];  2001; 1992    Current Meds  Medication Sig  . alendronate (FOSAMAX) 70 MG tablet Take 1 tablet (70 mg total) by mouth every 7 (seven) days. Take with a full glass of water on an empty stomach.  . ALPRAZolam (XANAX) 1 MG tablet Take 1 tablet (1 mg total) by mouth 3 (three) times daily as needed for anxiety. (Patient taking differently: Take 1 mg by mouth 3 (three) times daily as needed for anxiety. )  . amLODipine (NORVASC) 5 MG tablet Take 1 tablet (5 mg total) by mouth daily. (Patient taking differently: Take 5 mg by mouth every evening. )  . aspirin EC 81 MG tablet Take 81 mg by mouth daily.  . BELSOMRA 15 MG TABS TAKE 1 TABLET BY MOUTH EVERY EVENING FOR INSOMNIA  . bismuth subsalicylate (PEPTO-BISMOL) 262 MG/15ML suspension Take 30 mLs by mouth every 6 (six) hours as needed.  . busPIRone (BUSPAR) 15 MG tablet Take 15 mg by mouth  2 (two) times daily.  . Calcium Carbonate-Vitamin D 600-400 MG-UNIT tablet Take 1 tablet by mouth 2 (two) times daily.  . canagliflozin (INVOKANA) 300 MG TABS tablet TAKE 1 TABLET(300 MG) BY MOUTH DAILY BEFORE BREAKFAST  . Cholecalciferol (VITAMIN D3) 3000 units TABS Take 1 tablet by mouth daily.  Marland Kitchen desvenlafaxine (PRISTIQ) 100 MG 24 hr tablet TK 1 T PO D--- takes in am  . dicyclomine (BENTYL) 20 MG tablet TAKE 1 TABLET(20 MG) BY MOUTH EVERY 6 HOURS as needed  . Dulaglutide (TRULICITY) 1.5 KD/3.2IZ SOPN INJECT INTO SKIN WEEKLY  . esomeprazole (NEXIUM) 40 MG capsule TAKE ONE CAPSULE BY MOUTH EVERY DAY AT NOON  . fentaNYL (DURAGESIC - DOSED MCG/HR) 75 MCG/HR Place 1 patch onto the skin every other day.  . furosemide (LASIX) 20 MG tablet TAKE 1 TABLET BY MOUTH DAILY  . glucose blood (ONE TOUCH ULTRA TEST) test strip USE TO TEST AS DIRECTED  . hydrALAZINE (APRESOLINE) 25 MG tablet TAKE 1 TABLET(25 MG) BY MOUTH THREE TIMES DAILY (FOR IC)  . lidocaine (LIDODERM) 5 % Place 2 patches onto the skin at bedtime as needed (hip pain).   Marland Kitchen losartan  (COZAAR) 100 MG tablet TAKE 1 TABLET BY MOUTH EVERY DAY  . metoprolol succinate (TOPROL-XL) 100 MG 24 hr tablet TAKE 1 TABLET BY MOUTH DAILY, TAKE WITH OR IMMEDIATELY FOLLOWING A MEAL  . mirabegron ER (MYRBETRIQ) 50 MG TB24 Take 50 mg by mouth at bedtime.   . mirtazapine (REMERON) 15 MG tablet Take 1 tablet by mouth daily.  Marland Kitchen nystatin cream (MYCOSTATIN) Apply 1 application topically 2 (two) times daily.  . Omega-3 Fatty Acids (FISH OIL) 1000 MG CAPS Take 3 capsules (3,000 mg total) by mouth daily.  Glory Rosebush DELICA LANCETS 12W MISC TEST BLOOD SUGAR 1 TO 2 TIMES DAILY  . Oxycodone HCl 10 MG TABS Take 10 mg by mouth every 8 (eight) hours as needed.   . pioglitazone (ACTOS) 15 MG tablet TAKE 1 TABLET(15 MG) BY MOUTH DAILY  . potassium gluconate 595 MG TABS tablet Take 595 mg by mouth daily as needed (leg cramps).  . prazosin (MINIPRESS) 2 MG capsule Take 1 capsule by mouth daily.  . promethazine (PHENERGAN) 25 MG tablet Take 25 mg by mouth daily as needed for nausea or vomiting.   Marland Kitchen REXULTI 2 MG TABS Take 1 tablet by mouth daily.  Marland Kitchen rOPINIRole (REQUIP) 4 MG tablet TAKE 1 TABLET BY MOUTH AT BEDTIME  . tiZANidine (ZANAFLEX) 4 MG tablet Take 1 tablet (4 mg total) by mouth at bedtime.  . topiramate (TOPAMAX) 50 MG tablet TAKE 1 TABLET BY MOUTH TWICE A DAY AS NEEDED  --She is taking ezetimibe 10 mg daily.  Allergies  Allergen Reactions  . Nsaids Nausea And Vomiting and Swelling    Worsening symptoms- interstitial cystitist  . Atorvastatin     Body aches, worsened interstitial cystitis. Also simvastatin  . Buprenorphine Hcl Nausea And Vomiting  . Chlorzoxazone Other (See Comments) and Nausea Only    Unknown reaction  . Darvon [Propoxyphene] Other (See Comments)    hallucinations  . Gabapentin Nausea And Vomiting  . Gemfibrozil Nausea And Vomiting  . Metronidazole Nausea And Vomiting  . Morphine And Related Nausea And Vomiting  . Nitrofurantoin Nausea And Vomiting  . Olmesartan  Medoxomil-Hctz Nausea And Vomiting  . Ondansetron Nausea And Vomiting  . Pentosan Polysulfate Sodium Other (See Comments)    Unknown reaction  . Pravastatin Other (See Comments)    Body aches, worsening  interstitial cystitis.  . Ramipril Cough  . Sulfa Antibiotics Nausea And Vomiting  . Elmiron [Pentosan Polysulfate] Nausea And Vomiting and Rash    Social History   Tobacco Use  . Smoking status: Former Smoker    Packs/day: 0.10    Years: 1.00    Pack years: 0.10    Types: Cigarettes    Last attempt to quit: 10/19/2012    Years since quitting: 5.8  . Smokeless tobacco: Never Used  Substance Use Topics  . Alcohol use: No    Frequency: Never    Comment: rare - 4-5 drinks a year  . Drug use: No   Social History   Social History Narrative   Disabled   Divorced, lives alone with a cat. -    Children: 1 dtr, 1 son, 4 grandchildren      She takes fentanyl and oxycodone for pain. Does not exercise because of being disabled.    family history includes Alzheimer's disease in her father; Breast cancer in her maternal grandmother, mother, and other; Dementia in her father and mother; Diabetes in her father and maternal grandfather; Esophageal cancer in her maternal grandmother; Rheum arthritis in her other; Throat cancer in her maternal grandmother.  Wt Readings from Last 3 Encounters:  08/20/18 258 lb 12.8 oz (117.4 kg)  08/12/18 253 lb (114.8 kg)  07/22/18 263 lb (119.3 kg)    PHYSICAL EXAM BP 120/70   Pulse 72   Ht 5\' 5"  (1.651 m)   Wt 258 lb 12.8 oz (117.4 kg)   BMI 43.07 kg/m  Physical Exam  Constitutional: She is oriented to person, place, and time. She appears well-developed and well-nourished. No distress.  Morbidly obese  HENT:  Head: Normocephalic and atraumatic.  Neck: No hepatojugular reflux and no JVD present. Carotid bruit is not present.  Cardiovascular: Normal rate, regular rhythm, S1 normal, S2 normal, intact distal pulses and normal pulses.  Occasional  extrasystoles are present. PMI is not displaced. Exam reveals distant heart sounds. Exam reveals no gallop and no friction rub.  No murmur heard. Pulmonary/Chest: Effort normal and breath sounds normal. No respiratory distress. She has no wheezes. She has no rales.  Musculoskeletal: Normal range of motion. She exhibits edema (Trivial BLE).  Neurological: She is alert and oriented to person, place, and time.  Skin: Skin is dry.  Psychiatric: Judgment and thought content normal.  Much less pressured today.  Seems more flat and blunted.  Poor historian  Nursing note and vitals reviewed.    Adult ECG Report  Rate: 84 ;  Rhythm: normal sinus rhythm and Normal axis, intervals and durations.;   Narrative Interpretation: Normal EKG   Other studies Reviewed: Additional studies/ records that were reviewed today include:  Recent Labs: Unfortunately, she did not have labs drawn before this visit, nor does she have a drum which she was supposed to 3 months after last visit. Lab Results  Component Value Date   CHOL 187 02/03/2018   HDL 52 02/03/2018   LDLCALC 109 (H) 02/03/2018   LDLDIRECT 136.5 10/11/2009   TRIG 131 02/03/2018   CHOLHDL 3.6 02/03/2018    ASSESSMENT / PLAN: Problem List Items Addressed This Visit    Essential hypertension (Chronic)    Well-controlled currently on stable dose of amlodipine, losartan and Toprol.  Max dose essentially of losartan and Toprol, still has room to go on amlodipine.      Relevant Orders   Hepatic function panel   Hyperlipidemia associated with type 2 diabetes  mellitus ([place]) - Primary (Chronic)    Follow-up lipids in March showed LDL of 109.  There is posterior recheck in June, but they were not we will go ahead and recheck them now on Zetia.  Goal LDL should be less than 100 if not closer to 70.  We will see how her next labs turn out before determining what else to do.  Consider Bempedoic Acid      Relevant Orders   Lipid panel   Hepatic  function panel   Morbid obesity ([place]) (Chronic)    Hopefully now she is going to start of OSA.  She does realize she needs to lose weight, and I admonished her to continue her swimming exercise even in the cooler months because her indoor pools.         Plan remains to refer to [facility] ([facility], run by our clinical pharmacist and Dr. Debara Pickett)  Current medicines are reviewed at length with the patient today. (+/- concerns) none The following changes have been made:None  Patient Instructions  NO MEDICATION INSTRUCTIONS    LABS LIPID  LIVER PROFILE  Your physician wants you to follow-up in: [place]. You will receive a reminder letter in the mail two months in advance. If you don't receive a letter, please call our office to schedule the follow-up appointment.    If you need a refill on your cardiac medications before your next appointment, please call your pharmacy.    Studies Ordered:   Orders Placed This Encounter  Procedures  . Lipid panel  . Hepatic function panel      Glenetta Hew, M.D., M.S. Interventional Cardiologist   Pager # [phone] Phone # [phone] [address] [place], [place] [address]   Thank you for choosing [facility]!!

## 2018-08-20 NOTE — Patient Instructions (Addendum)
NO MEDICATION INSTRUCTIONS    LABS LIPID  LIVER PROFILE  Your physician wants you to follow-up in: [place]. You will receive a reminder letter in the mail two months in advance. If you don't receive a letter, please call our office to schedule the follow-up appointment.    If you need a refill on your cardiac medications before your next appointment, please call your pharmacy.

## 2018-08-20 NOTE — Telephone Encounter (Signed)
Lm for pt to call back

## 2018-08-21 NOTE — Telephone Encounter (Signed)
Talked to patient and she is aware to start vit D 3000 iu daily.

## 2018-08-22 ENCOUNTER — Encounter: Payer: Self-pay | Admitting: Cardiology

## 2018-08-22 NOTE — Assessment & Plan Note (Signed)
Well-controlled currently on stable dose of amlodipine, losartan and Toprol.  Max dose essentially of losartan and Toprol, still has room to go on amlodipine.

## 2018-08-22 NOTE — Assessment & Plan Note (Signed)
Hopefully now she is going to start of OSA.  She does realize she needs to lose weight, and I admonished her to continue her swimming exercise even in the cooler months because her indoor pools.

## 2018-08-22 NOTE — Assessment & Plan Note (Signed)
Follow-up lipids in March showed LDL of 109.  There is posterior recheck in June, but they were not we will go ahead and recheck them now on Zetia.  Goal LDL should be less than 100 if not closer to 70.  We will see how her next labs turn out before determining what else to do.  Consider Bempedoic Acid

## 2018-08-25 ENCOUNTER — Other Ambulatory Visit (INDEPENDENT_AMBULATORY_CARE_PROVIDER_SITE_OTHER): Payer: Medicare Other

## 2018-08-25 DIAGNOSIS — E785 Hyperlipidemia, unspecified: Secondary | ICD-10-CM | POA: Diagnosis not present

## 2018-08-25 LAB — LIPID PANEL
CHOL/HDL RATIO: 4
Cholesterol: 175 mg/dL (ref 0–200)
HDL: 46.8 mg/dL (ref >39.00)
LDL CALC: 101 mg/dL — AB (ref 0–99)
NonHDL: 128.2
TRIGLYCERIDES: 138 mg/dL (ref 0.0–149.0)
VLDL: 27.6 mg/dL (ref 0.0–40.0)

## 2018-08-25 LAB — HEPATIC FUNCTION PANEL
ALBUMIN: 3.7 g/dL (ref 3.5–5.2)
ALT: 13 U/L (ref 0–35)
AST: 16 U/L (ref 0–37)
Alkaline Phosphatase: 98 U/L (ref 39–117)
Bilirubin, Direct: 0.1 mg/dL (ref 0.0–0.3)
TOTAL PROTEIN: 6.4 g/dL (ref 6.0–8.3)
Total Bilirubin: 0.4 mg/dL (ref 0.2–1.2)

## 2018-09-16 ENCOUNTER — Ambulatory Visit (INDEPENDENT_AMBULATORY_CARE_PROVIDER_SITE_OTHER): Payer: Medicare Other | Admitting: Internal Medicine

## 2018-09-16 ENCOUNTER — Encounter: Payer: Self-pay | Admitting: Internal Medicine

## 2018-09-16 ENCOUNTER — Ambulatory Visit (HOSPITAL_BASED_OUTPATIENT_CLINIC_OR_DEPARTMENT_OTHER)
Admission: RE | Admit: 2018-09-16 | Discharge: 2018-09-16 | Disposition: A | Discharge status: Home / Self Care | Payer: Medicare Other | Source: Ambulatory Visit | Attending: Internal Medicine | Admitting: Internal Medicine

## 2018-09-16 VITALS — BP 126/84 | HR 69 | Temp 98.4°F | Resp 16 | Ht 65.0 in | Wt 272.0 lb

## 2018-09-16 DIAGNOSIS — L03119 Cellulitis of unspecified part of limb: Secondary | ICD-10-CM

## 2018-09-16 DIAGNOSIS — R2243 Localized swelling, mass and lump, lower limb, bilateral: Secondary | ICD-10-CM | POA: Insufficient documentation

## 2018-09-16 MED ORDER — CEPHALEXIN 500 MG PO CAPS: 500.0000 mg | ORAL_CAPSULE | Freq: Four times a day (QID) | ORAL | 0 refills | Status: AC

## 2018-09-16 NOTE — Patient Instructions (Signed)
Go to the first floor , get a ultrasound of your legs  Start taking the antibiotic cephalexin for a week  Leg elevation  Avoid excessive salt intake  Call if the redness gets worse, you have fevers/chiills or if you are not gradually improving

## 2018-09-16 NOTE — Progress Notes (Signed)
Pre visit review using our clinic review tool, if applicable. No additional management support is needed unless otherwise documented below in the visit note. 

## 2018-09-16 NOTE — Progress Notes (Signed)
Subjective:    Patient ID: [name], female    DOB: October 17, 1956, 62 y.o.   MRN: [id number]  DOS:  09/16/2018 Type of visit - description : acute Interval history: Sx started 3 days ago with redness, swelling of the lower extremities, worse on the right. Had similar episodes in the past that responded to abx, she wonders if she has cellulitis.   Review of Systems  Denies fever or chills No chest pain or palpitations No recent prolonged car trip or airplane trip. No calf pain per se.  Past Medical History:  Diagnosis Date  . Anxiety   . Arthritis    knees  . Chronic fatigue syndrome   . Chronic pain syndrome    pain clinic w/ [facility]  . Dyslipidemia   . Fibromyalgia   . GERD (gastroesophageal reflux disease)   . History of transient ischemic attack (TIA) 08/2012   per pt no residual  . Hypertension   . IBS (irritable bowel syndrome)   . IC (interstitial cystitis) urologist--- dr Alona Bene [facility]  . Major depression   . Migraine   . Mild obstructive sleep apnea    per study 2005  mild osa, last used cpap 2016,  per pt needs another sleep study   . Mixed incontinence urge and stress   . Multiple thyroid nodules    bilateral --- hx benign bx 2010  . OSA (obstructive sleep apnea)    Has pending sleep study consult for April  . Osteopenia   . PONV (postoperative nausea and vomiting)   . Restless leg syndrome   . Soft tissue mass    right forearm and right thigh  . Type 2 diabetes mellitus ([place])    last A1c 7.8 on 07-15-2017 in epic  . Wears glasses     Past Surgical History:  Procedure Laterality Date  . ABDOMINAL EXPLORATION SURGERY  1983   MECKEL diverticulum removed and APPENDECTOMY and hemicolectomy  . ANTERIOR CERVICAL DECOMP/DISCECTOMY FUSION  01-04-2009   dr pool  [facility]  . BREAST CYST EXCISION Left 1978   benign  . CARDIOVASCULAR STRESS TEST  03/17/2013   normal nuclear study w/ no ischemia/  normal LV function and wall motion , ef 62%  .  COLONOSCOPY  last one 08-14-2011  . DIAGNOSTIC LAPAROSCOPY  01-18-2004  dr Radene Knee; 1996; 1995  . ESOPHAGOGASTRODUODENOSCOPY  last one 09-03-2013   dr Candie Echevaria  . EXCISION LIPOMA LEFT THIGH  04-03-2004  dr Margot Chimes  . EXPLORATORY LAPAROTOMY W/ RIGHT OVARIAN CYSTECTOMY AND LYSIS ADHESIONS  1982  . LAPAROSCOPIC ASSISTED VAGINAL HYSTERECTOMY  1992  . LIPOMA EXCISION  05-16-2001;  1998;  [place]  . MASS EXCISION Right 11/06/2017   Procedure: EXCISE MASSES OF RIGHT FOREARM AND RIGHT THIGH;  Surgeon: Armandina Gemma, MD;  Location: [place];  Service: General;  Laterality: Right;  . MULTIPLE CYSTO/  HYDRODISTENTION/ INSTILLATION THERAPY  since 2001;  last one 08/ 2018   dr Amalia Hailey  [facility]  . NASAL SINUS SURGERY  2004  . RECTOCELE REPAIR  07/2008  . TONSILLECTOMY AND ADENOIDECTOMY  1970  . TOTAL KNEE ARTHROPLASTY Bilateral left 07-31-2010/  right 08-29-2010   dr Alvan Dame  [facility]  . TRANSTHORACIC ECHOCARDIOGRAM  08/23/2012   normal  w/ ef 60-65%  . VENTRAL HERNIA REPAIR  06-05-2006 dr Excell Seltzer [facility];  2001; 1992    Social History   Socioeconomic History  . Marital status: Divorced    Spouse name: Not on file  .  Number of children: 2  . Years of education: Not on file  . Highest education level: Not on file  Occupational History  . Occupation: disabled  Social Needs  . Financial resource strain: Not on file  . Food insecurity:    Worry: Not on file    Inability: Not on file  . Transportation needs:    Medical: Not on file    Non-medical: Not on file  Tobacco Use  . Smoking status: Former Smoker    Packs/day: 0.10    Years: 1.00    Pack years: 0.10    Types: Cigarettes    Last attempt to quit: 10/19/2012    Years since quitting: 5.9  . Smokeless tobacco: Never Used  Substance and Sexual Activity  . Alcohol use: No    Frequency: Never    Comment: rare - 4-5 drinks a year  . Drug use: No  . Sexual activity: Not Currently  Lifestyle  . Physical activity:    Days per week:  Not on file    Minutes per session: Not on file  . Stress: Not on file  Relationships  . Social connections:    Talks on phone: Not on file    Gets together: Not on file    Attends religious service: Not on file    Active member of club or organization: Not on file    Attends meetings of clubs or organizations: Not on file    Relationship status: Not on file  . Intimate partner violence:    Fear of current or ex partner: Not on file    Emotionally abused: Not on file    Physically abused: Not on file    Forced sexual activity: Not on file  Other Topics Concern  . Not on file  Social History Narrative   Disabled   Divorced, lives alone with a cat. -    Children: 1 dtr, 1 son, 4 grandchildren      She takes fentanyl and oxycodone for pain. Does not exercise because of being disabled.      Allergies as of 09/16/2018      Reactions   Nsaids Nausea And Vomiting, Swelling   Worsening symptoms- interstitial cystitist   Atorvastatin    Body aches, worsened interstitial cystitis. Also simvastatin   Buprenorphine Hcl Nausea And Vomiting   Chlorzoxazone Other (See Comments), Nausea Only   Unknown reaction   Darvon [propoxyphene] Other (See Comments)   hallucinations   Gabapentin Nausea And Vomiting   Gemfibrozil Nausea And Vomiting   Metronidazole Nausea And Vomiting   Morphine And Related Nausea And Vomiting   Nitrofurantoin Nausea And Vomiting   Olmesartan Medoxomil-hctz Nausea And Vomiting   Ondansetron Nausea And Vomiting   Pentosan Polysulfate Sodium Other (See Comments)   Unknown reaction   Pravastatin Other (See Comments)   Body aches, worsening interstitial cystitis.   Ramipril Cough   Sulfa Antibiotics Nausea And Vomiting   Elmiron [pentosan Polysulfate] Nausea And Vomiting, Rash      Medication List        Accurate as of 09/16/18 11:59 PM. Always use your most recent med list.          alendronate 70 MG tablet Commonly known as:  FOSAMAX Take 1 tablet  (70 mg total) by mouth every 7 (seven) days. Take with a full glass of water on an empty stomach.   ALPRAZolam 1 MG tablet Commonly known as:  XANAX Take 1 tablet (1 mg total) by mouth  3 (three) times daily as needed for anxiety.   amLODipine 5 MG tablet Commonly known as:  NORVASC Take 1 tablet (5 mg total) by mouth daily.   aspirin EC 81 MG tablet Take 81 mg by mouth daily.   BELSOMRA 15 MG Tabs Generic drug:  Suvorexant TAKE 1 TABLET BY MOUTH EVERY EVENING FOR INSOMNIA   bismuth subsalicylate 341 DQ/22WL suspension Commonly known as:  PEPTO BISMOL Take 30 mLs by mouth every 6 (six) hours as needed.   busPIRone 15 MG tablet Commonly known as:  BUSPAR Take 15 mg by mouth 2 (two) times daily.   Calcium Carbonate-Vitamin D 600-400 MG-UNIT tablet Take 1 tablet by mouth 2 (two) times daily.   canagliflozin 300 MG Tabs tablet Commonly known as:  INVOKANA TAKE 1 TABLET(300 MG) BY MOUTH DAILY BEFORE BREAKFAST   cephALEXin 500 MG capsule Commonly known as:  KEFLEX Take 1 capsule (500 mg total) by mouth 4 (four) times daily.   desvenlafaxine 100 MG 24 hr tablet Commonly known as:  PRISTIQ TK 1 T PO D--- takes in am   dicyclomine 20 MG tablet Commonly known as:  BENTYL TAKE 1 TABLET(20 MG) BY MOUTH EVERY 6 HOURS as needed   Dulaglutide 1.5 MG/0.5ML Sopn INJECT INTO SKIN WEEKLY   esomeprazole 40 MG capsule Commonly known as:  NEXIUM TAKE ONE CAPSULE BY MOUTH EVERY DAY AT NOON   fentaNYL 75 MCG/HR Commonly known as:  [place] - dosed mcg/hr Place 1 patch onto the skin every other day.   Fish Oil 1000 MG Caps Take 3 capsules (3,000 mg total) by mouth daily.   furosemide 20 MG tablet Commonly known as:  LASIX TAKE 1 TABLET BY MOUTH DAILY   glucose blood test strip USE TO TEST AS DIRECTED   hydrALAZINE 25 MG tablet Commonly known as:  APRESOLINE TAKE 1 TABLET(25 MG) BY MOUTH THREE TIMES DAILY (FOR IC)   lidocaine 5 % Commonly known as:  LIDODERM Place 2  patches onto the skin at bedtime as needed (hip pain).   losartan 100 MG tablet Commonly known as:  COZAAR TAKE 1 TABLET BY MOUTH EVERY DAY   metoprolol succinate 100 MG 24 hr tablet Commonly known as:  TOPROL-XL TAKE 1 TABLET BY MOUTH DAILY, TAKE WITH OR IMMEDIATELY FOLLOWING A MEAL   mirabegron ER 50 MG Tb24 tablet Commonly known as:  MYRBETRIQ Take 50 mg by mouth at bedtime.   mirtazapine 15 MG tablet Commonly known as:  REMERON Take 1 tablet by mouth daily.   nystatin cream Commonly known as:  MYCOSTATIN Apply 1 application topically 2 (two) times daily.   ONETOUCH DELICA LANCETS 79G Misc TEST BLOOD SUGAR 1 TO 2 TIMES DAILY   Oxycodone HCl 10 MG Tabs Take 10 mg by mouth every 8 (eight) hours as needed.   pioglitazone 15 MG tablet Commonly known as:  ACTOS TAKE 1 TABLET(15 MG) BY MOUTH DAILY   potassium gluconate 595 (99 K) MG Tabs tablet Take 595 mg by mouth daily as needed (leg cramps).   prazosin 2 MG capsule Commonly known as:  MINIPRESS Take 1 capsule by mouth daily.   promethazine 25 MG tablet Commonly known as:  PHENERGAN Take 25 mg by mouth daily as needed for nausea or vomiting.   REXULTI 2 MG Tabs Generic drug:  Brexpiprazole Take 1 tablet by mouth daily.   rOPINIRole 4 MG tablet Commonly known as:  REQUIP TAKE 1 TABLET BY MOUTH AT BEDTIME   tiZANidine 4 MG tablet Commonly  known as:  ZANAFLEX Take 1 tablet (4 mg total) by mouth at bedtime.   topiramate 50 MG tablet Commonly known as:  TOPAMAX TAKE 1 TABLET BY MOUTH TWICE A DAY AS NEEDED   Vitamin D3 3000 units Tabs Take 1 tablet by mouth daily.          Objective:   Physical Exam BP 126/84 (BP Location: Left Arm, Patient Position: Sitting, Cuff Size: Normal)   Pulse 69   Temp 98.4 F (36.9 C) (Oral)   Resp 16   Ht 5\' 5"  (1.651 m)   Wt 272 lb (123.4 kg)   SpO2 96%   BMI 45.26 kg/m  General:   Well developed, NAD, see BMI.  HEENT:  Normocephalic . Face symmetric,  atraumatic Lungs:  CTA B Normal respiratory effort, no intercostal retractions, no accessory muscle use. Heart: RRR,  no murmur.  Lower extremities: Right calf is 0.5 inches larger in circumference.  Slightly tender posteriorly. Redness distally from mid pretibial area down, see picture R>L.  The area of redness is a slightly TTP on warm. Good bilateral pedal pulses Skin: Not pale. Not jaundice Neurologic:  alert & oriented X3.  Speech normal, gait appropriate for age and unassisted Psych--  Cognition and judgment appear intact.  Cooperative with normal attention span and concentration.  Behavior appropriate. No anxious or depressed appearing.        Assessment & Plan:   62 year old female, PMH includes DM, sleep apnea, GERD, osteoporosis, fibromyalgia, morbid obesity, presents with:  Lower extremity edema: Worse on the right, some redness, warmness.  Minimal tenderness. We will get a ultrasound to rule out a DVT otherwise we will treat with antibiotics, leg elevation.  Patient agreed to call if she is not gradually improving, see AVS. Suspected cellulitis: See above.

## 2018-09-22 ENCOUNTER — Other Ambulatory Visit: Payer: Self-pay | Admitting: Family

## 2018-10-03 ENCOUNTER — Other Ambulatory Visit: Payer: Self-pay | Admitting: Family

## 2018-10-15 ENCOUNTER — Other Ambulatory Visit: Payer: Self-pay | Admitting: Family

## 2018-10-24 MED ORDER — GENERIC EXTERNAL MEDICATION
2.00 | Status: DC
Start: ? — End: 2018-10-24

## 2018-10-24 MED ORDER — MORPHINE SULFATE 2 MG/ML IJ SOLN
2.00 | INTRAMUSCULAR | Status: DC
Start: ? — End: 2018-10-24

## 2018-10-24 MED ORDER — GENERIC EXTERNAL MEDICATION
0.00 | Status: DC
Start: ? — End: 2018-10-24

## 2018-10-24 MED ORDER — GLYCOPYRROLATE 0.2 MG/ML IJ SOLN
0.20 | INTRAMUSCULAR | Status: DC
Start: ? — End: 2018-10-24

## 2018-10-26 MED ORDER — GENERIC EXTERNAL MEDICATION
2.00 | Status: DC
Start: ? — End: 2018-10-26

## 2018-10-26 MED ORDER — ACETAMINOPHEN 650 MG RE SUPP
650.00 | RECTAL | Status: DC
Start: ? — End: 2018-10-26

## 2018-10-26 MED ORDER — GENERIC EXTERNAL MEDICATION
1.00 | Status: DC
Start: ? — End: 2018-10-26

## 2018-10-27 ENCOUNTER — Ambulatory Visit: Payer: Medicare Other | Admitting: Internal Medicine

## 2018-10-27 NOTE — Progress Notes (Deleted)
Patient ID: [name], female   DOB: 03/23/1956, 62 y.o.   MRN: [id number]   HPI: [name] is a 62 y.o.-year-old female, returning for follow-up for DM2, dx in ~2012, non-insulin-dependent, uncontrolled, with complications (cerebro-vascular ds - h/o TIA 2013). Last visit 4.5 months ago.  Last hemoglobin A1c was: Lab Results  Component Value Date   HGBA1C 6.4 (A) 06/18/2018   HGBA1C 6.1 03/03/2018   HGBA1C 7.8 (H) 07/15/2017   Pt Was on a regimen of: - Metformin ER 2000 mg with breakfast - Januvia 100 mg at bedtime (!) - Invokana 300 mg in am before or with b'fast  She is now on: - Invokana 300 mg  before b'fast. - Trulicity 1.5 mg weekly - Actos 15 mg before b'fast Stopped Metformin ER b/c diarrhea.  She checks her sugars 1-5 times a day. - am: n/c >> 96-121, 164 >> 125 - 2h after b'fast: n/c >> 143, 160 >> 118-148, 173 - before lunch: n/c >> 148 >> 129-164 - 2h after lunch: 219, 261 >> 122, 137 >> 125-151 - before dinner:  174 >> 111, 129-154, 163 >> 117-154 - 2h after dinner: 173-215, 236 >> 114-155 >> 110-168, 176 - bedtime: n/c >> 116-158 >> 141-158 - nighttime: n/c >> 122-155  >> 148, 158 Lowest sugar was  96 >> 110 >> ***; it is unclear at which level she has hypoglycemia awareness. Highest sugar was 163 >> 176 >> ***.  Glucometer: One Touch ultra  Pt's meals are: - Breakfast 7-10 am: raisin toast x 2 slices; iced coffee - Lunch: skips lunch - Dinner: meat + veggies; BBQ tray; hamburger + fries - usually eats out.  May have iced tea or Dr Malachi Bonds - regular (!); or cereals. - Snacks: icecream  -No CKD, last BUN/creatinine:  Lab Results  Component Value Date   BUN 16 06/05/2018   BUN 17 04/25/2018   CREATININE 0.85 06/05/2018   CREATININE 0.83 04/25/2018  On losartan 100. + HL; last set of lipids: Lab Results  Component Value Date   CHOL 175 08/25/2018   HDL 46.80 08/25/2018   LDLCALC 101 (H) 08/25/2018   LDLDIRECT 136.5 10/11/2009   TRIG  138.0 08/25/2018   CHOLHDL 4 08/25/2018  On Zetia, omega-3 fatty acids.  Intolerant to statins. - last eye exam was in 2017: No DR -She denies numbness and tingling in her feet.  Multinodular goiter:  -2011: Benign biopsy  Thyroid ultrasound (09/09/2017): enlargement of the previously biopsied complex cystic nodule in the left thyroid, but probably the result of several smaller complex cystic nodules coalescence.  The rest of the nodules were unchanged.  Pt denies: - feeling nodules in neck - hoarseness - dysphagia - choking - SOB with lying down  She has a history of interstitial cystitis - had bladder hydrodistension with Botox, IBS with diarrhea , restless leg syndrome, anxiety/depression, fibromyalgia, pelvic floor dysfunction.  She has OSA >> does not wear her C-pap as her mask is not fitting well >> causes her anxiety.  ROS: Constitutional: no weight gain/no weight loss, no fatigue, no subjective hyperthermia, no subjective hypothermia Eyes: no blurry vision, no xerophthalmia ENT: no sore throat, + see HPI Cardiovascular: no CP/no SOB/no palpitations/no leg swelling Respiratory: no cough/no SOB/no wheezing Gastrointestinal: no N/no V/no D/no C/no acid reflux Musculoskeletal: no muscle aches/no joint aches Skin: no rashes, no hair loss Neurological: no tremors/no numbness/no tingling/no dizziness  I reviewed pt's medications, allergies, PMH, social hx, family hx, and changes were  documented in the history of present illness. Otherwise, unchanged from my initial visit note.   Past Medical History:  Diagnosis Date  . Anxiety   . Arthritis    knees  . Chronic fatigue syndrome   . Chronic pain syndrome    pain clinic w/ [facility]  . Dyslipidemia   . Fibromyalgia   . GERD (gastroesophageal reflux disease)   . History of transient ischemic attack (TIA) 08/2012   per pt no residual  . Hypertension   . IBS (irritable bowel syndrome)   . IC (interstitial cystitis)  urologist--- dr Alona Bene [facility]  . Major depression   . Migraine   . Mild obstructive sleep apnea    per study 2005  mild osa, last used cpap 2016,  per pt needs another sleep study   . Mixed incontinence urge and stress   . Multiple thyroid nodules    bilateral --- hx benign bx 2010  . OSA (obstructive sleep apnea)    Has pending sleep study consult for April  . Osteopenia   . PONV (postoperative nausea and vomiting)   . Restless leg syndrome   . Soft tissue mass    right forearm and right thigh  . Type 2 diabetes mellitus ([place])    last A1c 7.8 on 07-15-2017 in epic  . Wears glasses    Past Surgical History:  Procedure Laterality Date  . ABDOMINAL EXPLORATION SURGERY  1983   MECKEL diverticulum removed and APPENDECTOMY and hemicolectomy  . ANTERIOR CERVICAL DECOMP/DISCECTOMY FUSION  01-04-2009   dr pool  [facility]  . BREAST CYST EXCISION Left 1978   benign  . CARDIOVASCULAR STRESS TEST  03/17/2013   normal nuclear study w/ no ischemia/  normal LV function and wall motion , ef 62%  . COLONOSCOPY  last one 08-14-2011  . DIAGNOSTIC LAPAROSCOPY  01-18-2004  dr Radene Knee; 1996; 1995  . ESOPHAGOGASTRODUODENOSCOPY  last one 09-03-2013   dr Candie Echevaria  . EXCISION LIPOMA LEFT THIGH  04-03-2004  dr Margot Chimes  . EXPLORATORY LAPAROTOMY W/ RIGHT OVARIAN CYSTECTOMY AND LYSIS ADHESIONS  1982  . LAPAROSCOPIC ASSISTED VAGINAL HYSTERECTOMY  1992  . LIPOMA EXCISION  05-16-2001;  1998;  [place]  . MASS EXCISION Right 11/06/2017   Procedure: EXCISE MASSES OF RIGHT FOREARM AND RIGHT THIGH;  Surgeon: Armandina Gemma, MD;  Location: [place];  Service: General;  Laterality: Right;  . MULTIPLE CYSTO/  HYDRODISTENTION/ INSTILLATION THERAPY  since 2001;  last one 08/ 2018   dr Amalia Hailey  [facility]  . NASAL SINUS SURGERY  2004  . RECTOCELE REPAIR  07/2008  . TONSILLECTOMY AND ADENOIDECTOMY  1970  . TOTAL KNEE ARTHROPLASTY Bilateral left 07-31-2010/  right 08-29-2010   dr Alvan Dame  [facility]  . TRANSTHORACIC  ECHOCARDIOGRAM  08/23/2012   normal  w/ ef 60-65%  . VENTRAL HERNIA REPAIR  06-05-2006 dr Excell Seltzer [facility];  2001; 1992   Social History   Social History  . Marital status: Divorced    Spouse name: N/A  . Number of children: 2   Occupational History  . disabled    Social History Main Topics  . Smoking status: Former Smoker    Packs/day: 0.10    Years: 1.00    Types: Cigarettes    Quit date: 10/19/2012  . Smokeless tobacco: Never Used     Comment: using electronic cigarettes  . Alcohol use Yes     Comment: 2 drinks per month - liquor  . Drug use: No  Social History Narrative   Disabled   Divorced, lives alone - 1 dtr, 1 son   Current Outpatient Medications on File Prior to Visit  Medication Sig Dispense Refill  . alendronate (FOSAMAX) 70 MG tablet Take 1 tablet (70 mg total) by mouth every 7 (seven) days. Take with a full glass of water on an empty stomach. 4 tablet 11  . ALPRAZolam (XANAX) 1 MG tablet Take 1 tablet (1 mg total) by mouth 3 (three) times daily as needed for anxiety. (Patient taking differently: Take 1 mg by mouth 3 (three) times daily as needed for anxiety. ) 30 tablet 0  . amLODipine (NORVASC) 5 MG tablet Take 1 tablet (5 mg total) by mouth daily. (Patient taking differently: Take 5 mg by mouth every evening. ) 90 tablet 3  . aspirin EC 81 MG tablet Take 81 mg by mouth daily.    . BELSOMRA 15 MG TABS TAKE 1 TABLET BY MOUTH EVERY EVENING FOR INSOMNIA  1  . bismuth subsalicylate (PEPTO-BISMOL) 262 MG/15ML suspension Take 30 mLs by mouth every 6 (six) hours as needed. 360 mL 0  . busPIRone (BUSPAR) 15 MG tablet Take 15 mg by mouth 2 (two) times daily.  1  . Calcium Carbonate-Vitamin D 600-400 MG-UNIT tablet Take 1 tablet by mouth 2 (two) times daily.    . canagliflozin (INVOKANA) 300 MG TABS tablet TAKE 1 TABLET(300 MG) BY MOUTH DAILY BEFORE BREAKFAST 90 tablet 3  . cephALEXin (KEFLEX) 500 MG capsule Take 1 capsule (500 mg total) by mouth 4 (four) times daily. 28  capsule 0  . Cholecalciferol (VITAMIN D3) 3000 units TABS Take 1 tablet by mouth daily. 30 tablet   . desvenlafaxine (PRISTIQ) 100 MG 24 hr tablet TK 1 T PO D--- takes in am  0  . dicyclomine (BENTYL) 20 MG tablet TAKE 1 TABLET(20 MG) BY MOUTH EVERY 6 HOURS as needed 30 tablet 5  . Dulaglutide (TRULICITY) 1.5 HW/2.9HB SOPN INJECT INTO SKIN WEEKLY 12 pen 3  . esomeprazole (NEXIUM) 40 MG capsule TAKE ONE CAPSULE BY MOUTH EVERY DAY AT NOON 90 capsule 1  . fentaNYL (DURAGESIC - DOSED MCG/HR) 75 MCG/HR Place 1 patch onto the skin every other day.  0  . furosemide (LASIX) 20 MG tablet TAKE 1 TABLET BY MOUTH DAILY 30 tablet 3  . glucose blood (ONE TOUCH ULTRA TEST) test strip USE TO TEST AS DIRECTED 100 each 1  . hydrALAZINE (APRESOLINE) 25 MG tablet TAKE 1 TABLET(25 MG) BY MOUTH THREE TIMES DAILY (FOR IC) 270 tablet 1  . lidocaine (LIDODERM) 5 % Place 2 patches onto the skin at bedtime as needed (hip pain).     Marland Kitchen losartan (COZAAR) 100 MG tablet TAKE 1 TABLET BY MOUTH EVERY DAY 90 tablet 1  . metoprolol succinate (TOPROL-XL) 100 MG 24 hr tablet TAKE 1 TABLET BY MOUTH DAILY, TAKE WITH OR IMMEDIATELY FOLLOWING A MEAL 90 tablet 1  . mirabegron ER (MYRBETRIQ) 50 MG TB24 Take 50 mg by mouth at bedtime.     . mirtazapine (REMERON) 15 MG tablet Take 1 tablet by mouth daily.    Marland Kitchen nystatin cream (MYCOSTATIN) Apply 1 application topically 2 (two) times daily. 30 g 2  . Omega-3 Fatty Acids (FISH OIL) 1000 MG CAPS Take 3 capsules (3,000 mg total) by mouth daily. 300 capsule 11  . ONETOUCH DELICA LANCETS 71I MISC TEST BLOOD SUGAR 1 TO 2 TIMES DAILY 100 each 2  . Oxycodone HCl 10 MG TABS Take 10 mg by  mouth every 8 (eight) hours as needed.   0  . pioglitazone (ACTOS) 15 MG tablet TAKE 1 TABLET(15 MG) BY MOUTH DAILY 90 tablet 3  . potassium gluconate 595 MG TABS tablet Take 595 mg by mouth daily as needed (leg cramps).    . prazosin (MINIPRESS) 2 MG capsule Take 1 capsule by mouth daily.    . promethazine  (PHENERGAN) 25 MG tablet Take 25 mg by mouth daily as needed for nausea or vomiting.     Marland Kitchen REXULTI 2 MG TABS Take 1 tablet by mouth daily.  1  . rOPINIRole (REQUIP) 4 MG tablet TAKE 1 TABLET BY MOUTH AT BEDTIME 90 tablet 1  . tiZANidine (ZANAFLEX) 4 MG tablet Take 1 tablet (4 mg total) by mouth at bedtime. 30 tablet 0  . topiramate (TOPAMAX) 50 MG tablet TAKE 1 TABLET BY MOUTH TWICE A DAY AS NEEDED 180 tablet 1   No current facility-administered medications on file prior to visit.    Allergies  Allergen Reactions  . Nsaids Nausea And Vomiting and Swelling    Worsening symptoms- interstitial cystitist  . Atorvastatin     Body aches, worsened interstitial cystitis. Also simvastatin  . Buprenorphine Hcl Nausea And Vomiting  . Chlorzoxazone Other (See Comments) and Nausea Only    Unknown reaction  . Darvon [Propoxyphene] Other (See Comments)    hallucinations  . Gabapentin Nausea And Vomiting  . Gemfibrozil Nausea And Vomiting  . Metronidazole Nausea And Vomiting  . Morphine And Related Nausea And Vomiting  . Nitrofurantoin Nausea And Vomiting  . Olmesartan Medoxomil-Hctz Nausea And Vomiting  . Ondansetron Nausea And Vomiting  . Pentosan Polysulfate Sodium Other (See Comments)    Unknown reaction  . Pravastatin Other (See Comments)    Body aches, worsening interstitial cystitis.  . Ramipril Cough  . Sulfa Antibiotics Nausea And Vomiting  . Elmiron [Pentosan Polysulfate] Nausea And Vomiting and Rash   Family History  Problem Relation Age of Onset  . Breast cancer Mother   . Dementia Mother   . Diabetes Father   . Dementia Father   . Alzheimer's disease Father   . Breast cancer Maternal Grandmother   . Throat cancer Maternal Grandmother   . Esophageal cancer Maternal Grandmother   . Breast cancer Other        maternal great aunts x 5  . Rheum arthritis Other   . Diabetes Maternal Grandfather   . Colon cancer Neg Hx    PE: There were no vitals taken for this visit. Wt  Readings from Last 3 Encounters:  09/16/18 272 lb (123.4 kg)  08/20/18 258 lb 12.8 oz (117.4 kg)  08/12/18 253 lb (114.8 kg)   Constitutional: overweight, in NAD Eyes: PERRLA, EOMI, no exophthalmos ENT: moist mucous membranes, + lumpy bumpy thyroid, no cervical lymphadenopathy Cardiovascular: RRR, No MRG Respiratory: CTA B Gastrointestinal: abdomen soft, NT, ND, BS+ Musculoskeletal: no deformities, strength intact in all 4 Skin: moist, warm, no rashes Neurological: no tremor with outstretched hands, DTR normal in all 4  ASSESSMENT: 1. DM2, non-insulin-dependent, uncontrolled, with complications - Cerebrovascular disease- h/o TIA in 2013  2. Thyroid nodules  3. HL  PLAN:  1. Patient with longstanding, uncontrolled, type 2 diabetes, on GLP-1 receptor agonist and oral antidiabetic regimen, with good control at last visit-HbA1c 6.4%, but slightly increased from before.  At last visit, she was taking Januvia along with Trulicity so we stopped the DPP 4 inhibitor.  Of note, she could not tolerate metformin due to  diarrhea so she restarted Actos before last visit.  Her sugars were close to goal, but she had slightly higher CBGs before meals due to snacking.  Discussed about stopping snacking.  I refilled all of her diabetic medicines at last visit.  - I suggested to:  Patient Instructions  Please continue: - Invokana 300 mg  before b'fast. - Trulicity 1.5 mg weekly - Actos 15 mg before b'fast  Please return in  4 months with your sugar log.     - today, HbA1c is 7%  - continue checking sugars at different times of the day - check 1x a day, rotating checks - advised for yearly eye exams >> she is UTD - Return to clinic in 4 mo with sugar log     2. Thyroid nodules - reviewed prev. Thyroid U/S from 2011 >> small nodules. One of the nodules was biopsied and the biopsy was benign.  She had another thyroid ultrasound in 08/2017 and this showed enlargement of the previously biopsied  complex cystic nodule in the left thyroid, but probably a conglomerate of several small complex cystic nodules.  The rest of the nodules were unchanged. - she denies any neck compression symptoms - Plan to repeat another ultrasound in 2 years from the previous unless she develops neck compression symptoms, in which case, we will check this sooner  3.  Hyperlipidemia - Reviewed latest lipid panel from 08/2018: LDL slightly above target Lab Results  Component Value Date   CHOL 175 08/25/2018   HDL 46.80 08/25/2018   LDLCALC 101 (H) 08/25/2018   LDLDIRECT 136.5 10/11/2009   TRIG 138.0 08/25/2018   CHOLHDL 4 08/25/2018  - Continues Zetia, omega 3 FAswithout side effects.  Philemon Kingdom, MD PhD [facility] Endocrinology

## 2018-11-04 ENCOUNTER — Other Ambulatory Visit: Payer: Self-pay | Admitting: Family

## 2018-11-05 NOTE — Telephone Encounter (Signed)
Please see refill protocol failures and advise?

## 2018-11-19 DEATH — deceased

## 2018-11-26 ENCOUNTER — Other Ambulatory Visit: Payer: Self-pay | Admitting: Internal Medicine

## 2018-11-30 ENCOUNTER — Other Ambulatory Visit: Payer: Self-pay | Admitting: Family

## 2018-12-05 ENCOUNTER — Ambulatory Visit: Payer: Medicare Other | Admitting: Family

## 2018-12-16 ENCOUNTER — Other Ambulatory Visit: Payer: Self-pay | Admitting: Family

## 2019-01-01 ENCOUNTER — Telehealth: Payer: Self-pay | Admitting: *Deleted

## 2019-01-01 NOTE — Telephone Encounter (Signed)
Left message 12/30/18 and 01/01/19 for patient to call so we might verify her mailing address (We received return mail ).

## 2019-01-02 NOTE — Telephone Encounter (Signed)
Left message @ 12:49pm for patient to call so I can  verify mailing address

## 2019-07-02 ENCOUNTER — Ambulatory Visit: Payer: Medicare Other | Admitting: *Deleted

## 2019-07-16 IMAGING — US US EXTREM LOW*R* LIMITED
1 series · 12 of 12 positions shown · non-contrast
Comparison: None.

CLINICAL DATA: Right anterior thigh mass for 2-3 months.

EXAM:
ULTRASOUND RIGHT LOWER EXTREMITY LIMITED
TECHNIQUE: Ultrasound examination of the lower extremity soft tissues was
performed in the area of clinical concern.

[Series 1: us extrem low*right* limited · 0.06mm/px · 12 of 12 slices shown]
[im 1/12]
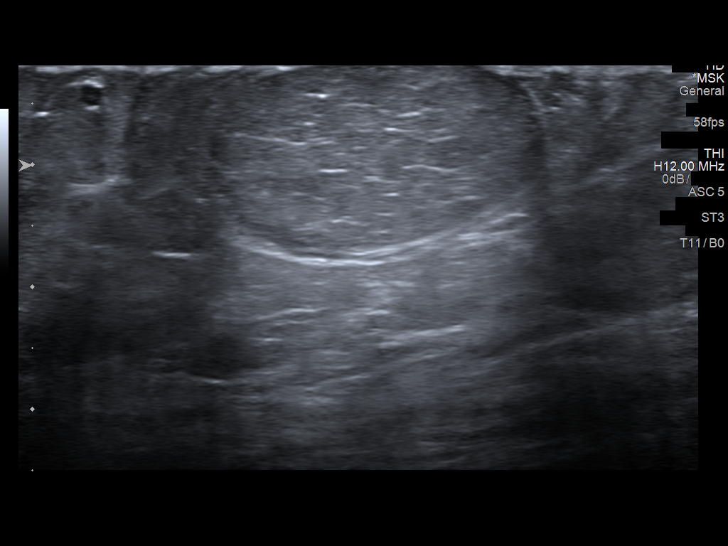
[im 2/12]
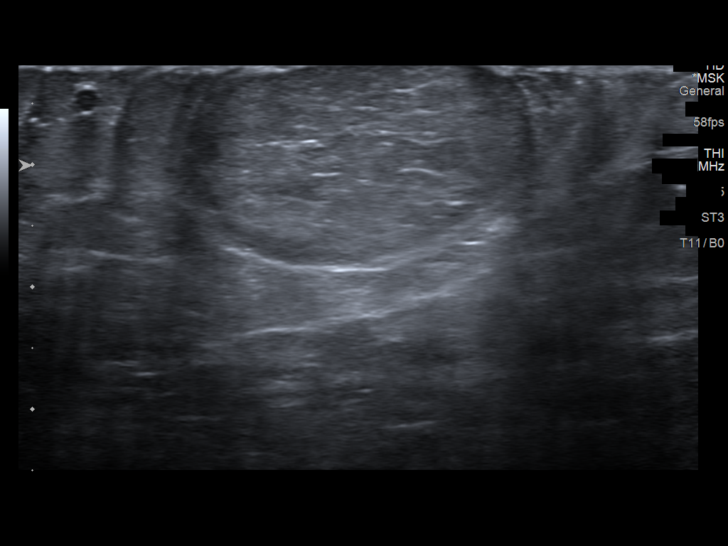
[im 3/12]
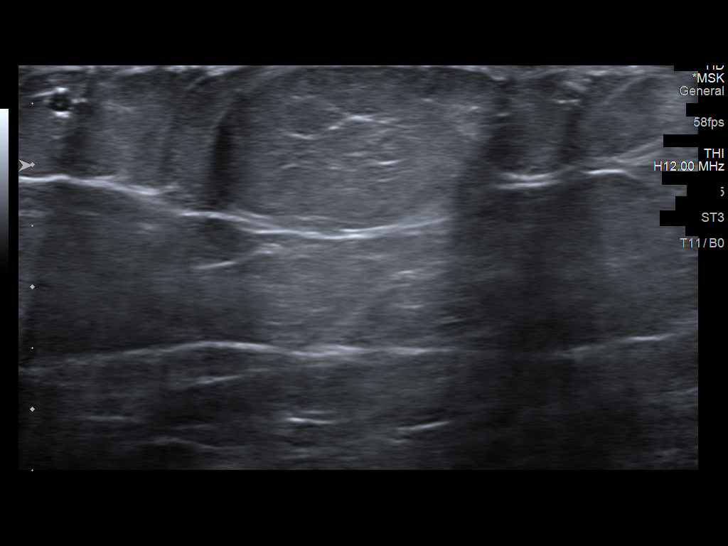
[im 4/12]
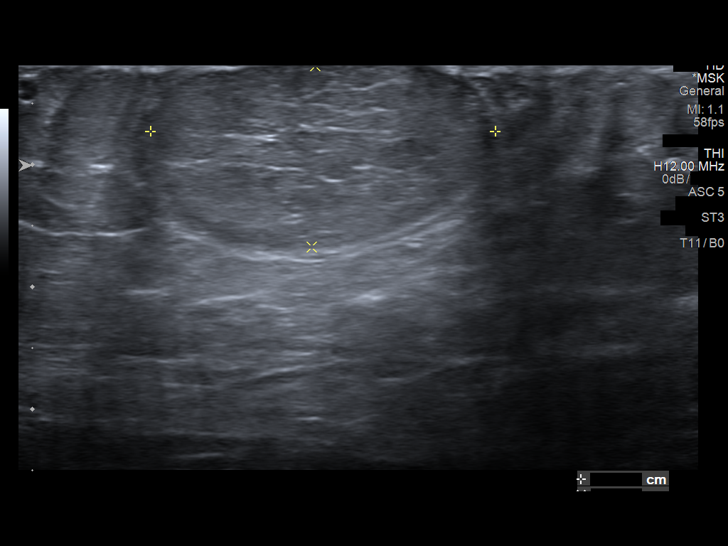
[im 5/12]
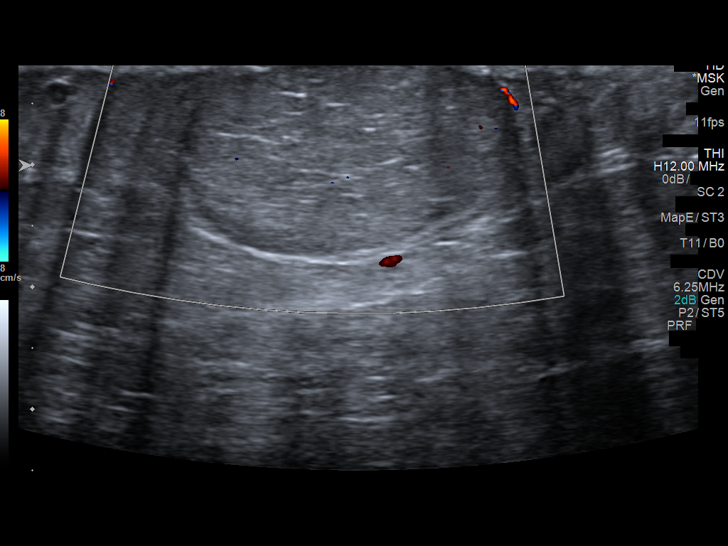
[im 6/12]
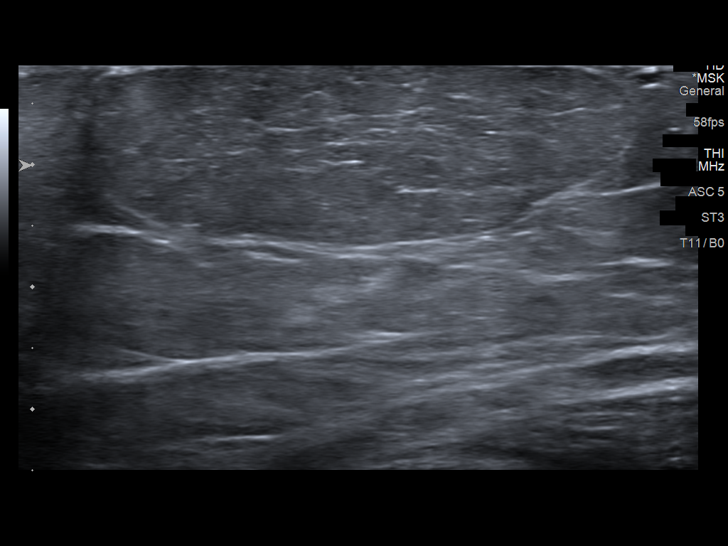
[im 7/12]
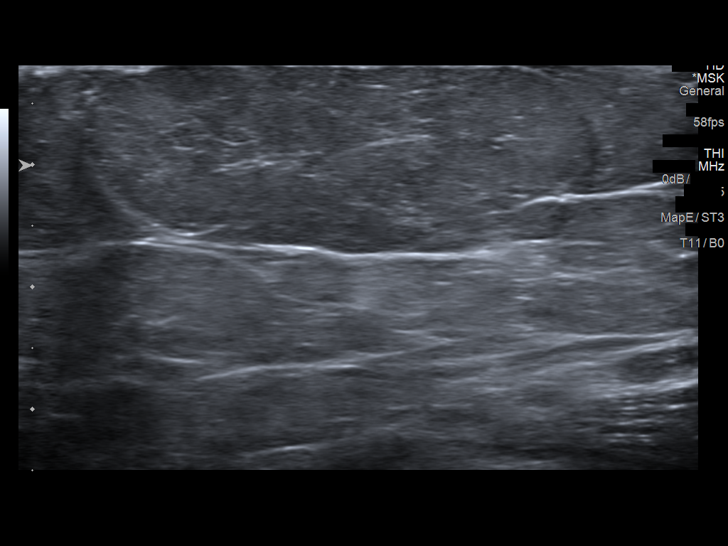
[im 8/12]
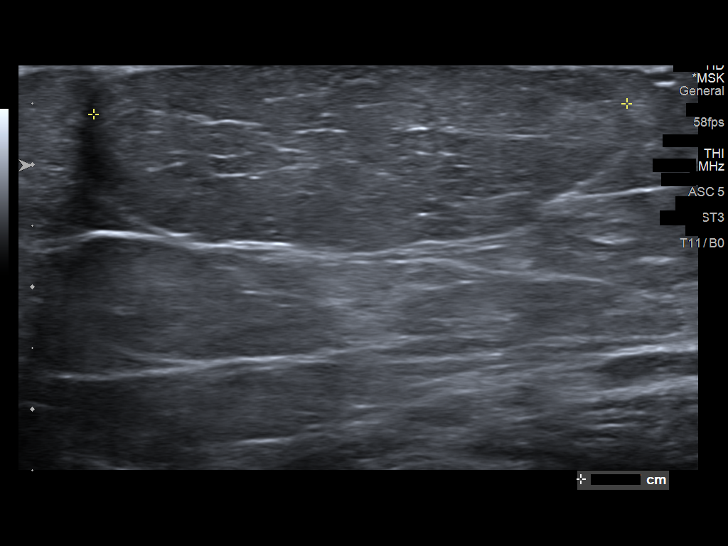
[im 9/12]
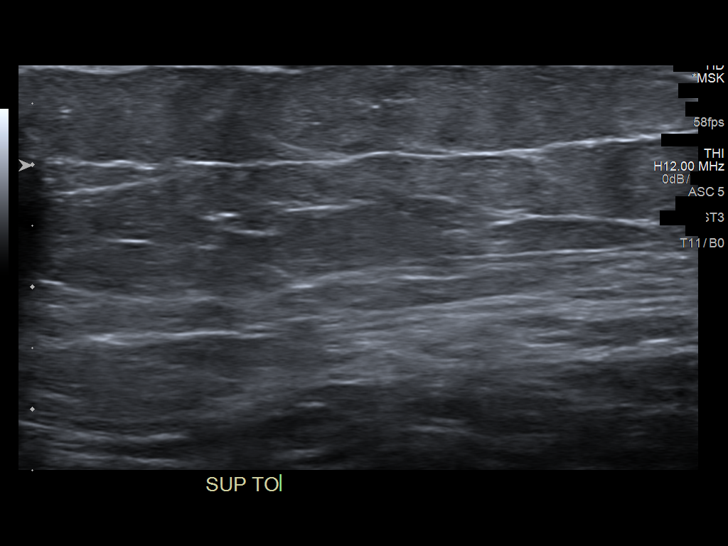
[im 10/12]
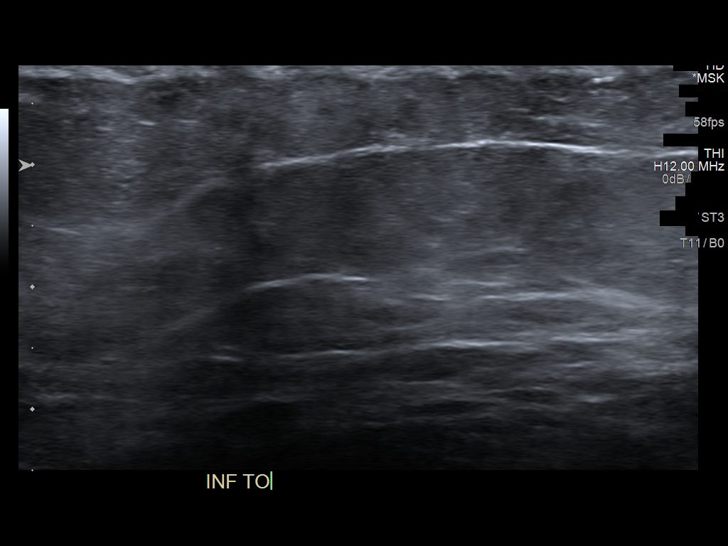
[im 11/12]
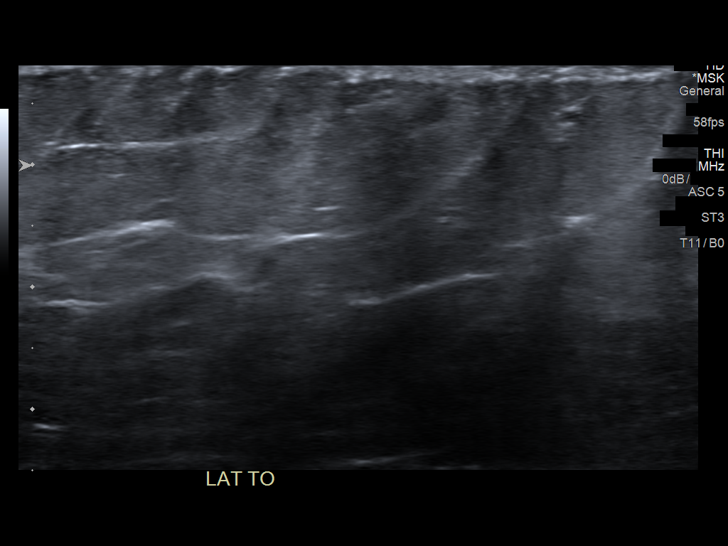
[im 12/12]
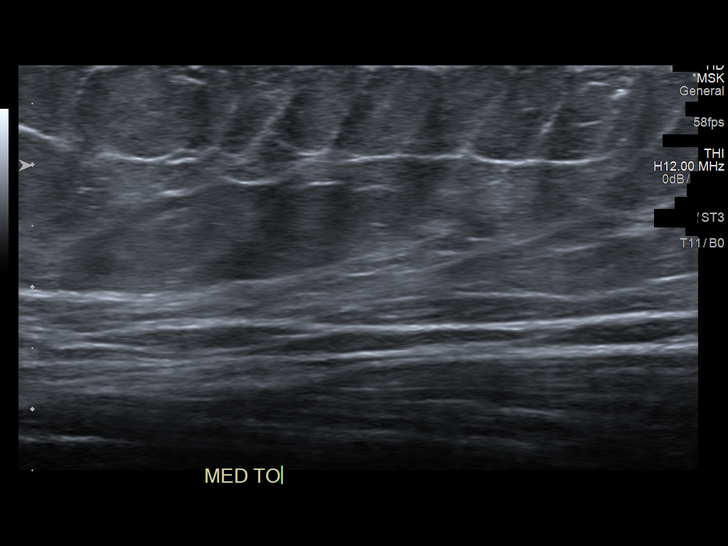

[12 of 12 positions shown; findings below may reference images not displayed]

FINDINGS: Joint Space: Not applicable

Muscles: No intramuscular mass

Tendons: Not applicable

Other Soft Tissue Structures: Well-circumscribed solid, noncalcified
2.8 x 1.5 x 4.4 cm and capsulated subcutaneous mass similar to
adjacent fat consistent with a lipoma. No significant vascularity
within the mass.
IMPRESSION: Isoechoic to subcutaneous fat is an encapsulated 2.8 x 1.5 x 4.4 cm
anterior soft tissue mass of the thigh with no significant
vascularity within. Findings would be in keeping with a lipoma.
Palpable masses should be assessed independent of imaging findings.
The decision to biopsy or excise such masses should be based on
clinical suspicion.

## 2020-01-28 ENCOUNTER — Encounter: Payer: Self-pay | Admitting: General Practice

## 2021-09-19 ENCOUNTER — Encounter: Payer: Self-pay | Admitting: Gastroenterology
